# Patient Record
Sex: Female | Born: 1935 | Race: White | Hispanic: No | Smoking: Never smoker
Health system: Southern US, Community
[De-identification: ages and names within clinical notes are randomized; demographics above are authoritative.]

## PROBLEM LIST (undated history)

## (undated) DIAGNOSIS — R519 Headache, unspecified: Secondary | ICD-10-CM

## (undated) DIAGNOSIS — J42 Unspecified chronic bronchitis: Secondary | ICD-10-CM

## (undated) DIAGNOSIS — J449 Chronic obstructive pulmonary disease, unspecified: Secondary | ICD-10-CM

## (undated) DIAGNOSIS — I480 Paroxysmal atrial fibrillation: Secondary | ICD-10-CM

## (undated) DIAGNOSIS — I350 Nonrheumatic aortic (valve) stenosis: Secondary | ICD-10-CM

## (undated) DIAGNOSIS — F329 Major depressive disorder, single episode, unspecified: Secondary | ICD-10-CM

## (undated) DIAGNOSIS — F32A Depression, unspecified: Secondary | ICD-10-CM

## (undated) DIAGNOSIS — E7800 Pure hypercholesterolemia, unspecified: Secondary | ICD-10-CM

## (undated) DIAGNOSIS — R51 Headache: Secondary | ICD-10-CM

## (undated) DIAGNOSIS — I509 Heart failure, unspecified: Secondary | ICD-10-CM

## (undated) DIAGNOSIS — K219 Gastro-esophageal reflux disease without esophagitis: Secondary | ICD-10-CM

## (undated) DIAGNOSIS — E119 Type 2 diabetes mellitus without complications: Secondary | ICD-10-CM

## (undated) DIAGNOSIS — I209 Angina pectoris, unspecified: Secondary | ICD-10-CM

## (undated) DIAGNOSIS — J189 Pneumonia, unspecified organism: Secondary | ICD-10-CM

## (undated) DIAGNOSIS — M1990 Unspecified osteoarthritis, unspecified site: Secondary | ICD-10-CM

## (undated) DIAGNOSIS — F419 Anxiety disorder, unspecified: Secondary | ICD-10-CM

## (undated) DIAGNOSIS — G43909 Migraine, unspecified, not intractable, without status migrainosus: Secondary | ICD-10-CM

## (undated) DIAGNOSIS — I4891 Unspecified atrial fibrillation: Secondary | ICD-10-CM

## (undated) DIAGNOSIS — R4589 Other symptoms and signs involving emotional state: Secondary | ICD-10-CM

## (undated) DIAGNOSIS — I10 Essential (primary) hypertension: Secondary | ICD-10-CM

## (undated) HISTORY — PX: CATARACT EXTRACTION, BILATERAL: SHX1313

## (undated) HISTORY — PX: CARDIAC VALVE REPLACEMENT: SHX585

## (undated) HISTORY — DX: Nonrheumatic aortic (valve) stenosis: I35.0

## (undated) HISTORY — DX: Paroxysmal atrial fibrillation: I48.0

## (undated) HISTORY — PX: EXCISIONAL HEMORRHOIDECTOMY: SHX1541

## (undated) HISTORY — PX: LAPAROSCOPIC CHOLECYSTECTOMY: SUR755

## (undated) HISTORY — DX: Anxiety disorder, unspecified: F41.9

## (undated) HISTORY — DX: Other symptoms and signs involving emotional state: R45.89

---

## 2014-07-29 DIAGNOSIS — I4891 Unspecified atrial fibrillation: Secondary | ICD-10-CM | POA: Insufficient documentation

## 2014-07-30 HISTORY — PX: CARDIAC PACEMAKER PLACEMENT: SHX583

## 2014-08-07 DIAGNOSIS — Z87898 Personal history of other specified conditions: Secondary | ICD-10-CM | POA: Insufficient documentation

## 2016-07-17 DIAGNOSIS — I10 Essential (primary) hypertension: Secondary | ICD-10-CM | POA: Insufficient documentation

## 2016-07-17 DIAGNOSIS — J449 Chronic obstructive pulmonary disease, unspecified: Secondary | ICD-10-CM | POA: Insufficient documentation

## 2016-07-17 DIAGNOSIS — Z952 Presence of prosthetic heart valve: Secondary | ICD-10-CM | POA: Insufficient documentation

## 2016-07-17 DIAGNOSIS — F5101 Primary insomnia: Secondary | ICD-10-CM | POA: Insufficient documentation

## 2016-07-17 DIAGNOSIS — Z95818 Presence of other cardiac implants and grafts: Secondary | ICD-10-CM | POA: Insufficient documentation

## 2016-07-17 DIAGNOSIS — K219 Gastro-esophageal reflux disease without esophagitis: Secondary | ICD-10-CM | POA: Insufficient documentation

## 2016-07-17 DIAGNOSIS — I480 Paroxysmal atrial fibrillation: Secondary | ICD-10-CM | POA: Insufficient documentation

## 2016-07-17 DIAGNOSIS — F411 Generalized anxiety disorder: Secondary | ICD-10-CM | POA: Insufficient documentation

## 2016-07-17 DIAGNOSIS — E119 Type 2 diabetes mellitus without complications: Secondary | ICD-10-CM | POA: Insufficient documentation

## 2016-07-17 DIAGNOSIS — E7800 Pure hypercholesterolemia, unspecified: Secondary | ICD-10-CM | POA: Insufficient documentation

## 2016-11-02 ENCOUNTER — Encounter: Payer: Self-pay | Admitting: Internal Medicine

## 2016-12-18 ENCOUNTER — Other Ambulatory Visit: Payer: Self-pay | Admitting: Family

## 2016-12-18 DIAGNOSIS — R1310 Dysphagia, unspecified: Secondary | ICD-10-CM

## 2016-12-20 ENCOUNTER — Ambulatory Visit
Admission: RE | Admit: 2016-12-20 | Discharge: 2016-12-20 | Disposition: A | Discharge status: Home / Self Care | Payer: Medicare Other | Source: Ambulatory Visit | Attending: Family | Admitting: Family

## 2016-12-20 DIAGNOSIS — R1310 Dysphagia, unspecified: Secondary | ICD-10-CM

## 2016-12-27 ENCOUNTER — Encounter: Payer: Self-pay | Admitting: Internal Medicine

## 2017-01-10 ENCOUNTER — Encounter (HOSPITAL_COMMUNITY): Payer: Self-pay | Admitting: *Deleted

## 2017-01-10 ENCOUNTER — Emergency Department (HOSPITAL_COMMUNITY): Payer: Medicare Other

## 2017-01-10 ENCOUNTER — Emergency Department (HOSPITAL_COMMUNITY)
Admission: EM | Admit: 2017-01-10 | Discharge: 2017-01-11 | Disposition: A | Discharge status: Home / Self Care | Payer: Medicare Other | Attending: Emergency Medicine | Admitting: Emergency Medicine

## 2017-01-10 DIAGNOSIS — Y92002 Bathroom of unspecified non-institutional (private) residence single-family (private) house as the place of occurrence of the external cause: Secondary | ICD-10-CM | POA: Diagnosis not present

## 2017-01-10 DIAGNOSIS — J449 Chronic obstructive pulmonary disease, unspecified: Secondary | ICD-10-CM | POA: Diagnosis not present

## 2017-01-10 DIAGNOSIS — W19XXXA Unspecified fall, initial encounter: Secondary | ICD-10-CM

## 2017-01-10 DIAGNOSIS — Y999 Unspecified external cause status: Secondary | ICD-10-CM | POA: Insufficient documentation

## 2017-01-10 DIAGNOSIS — S2241XA Multiple fractures of ribs, right side, initial encounter for closed fracture: Secondary | ICD-10-CM | POA: Insufficient documentation

## 2017-01-10 DIAGNOSIS — R1110 Vomiting, unspecified: Secondary | ICD-10-CM | POA: Insufficient documentation

## 2017-01-10 DIAGNOSIS — W01198A Fall on same level from slipping, tripping and stumbling with subsequent striking against other object, initial encounter: Secondary | ICD-10-CM | POA: Insufficient documentation

## 2017-01-10 DIAGNOSIS — I110 Hypertensive heart disease with heart failure: Secondary | ICD-10-CM | POA: Insufficient documentation

## 2017-01-10 DIAGNOSIS — I509 Heart failure, unspecified: Secondary | ICD-10-CM | POA: Insufficient documentation

## 2017-01-10 DIAGNOSIS — E119 Type 2 diabetes mellitus without complications: Secondary | ICD-10-CM | POA: Diagnosis not present

## 2017-01-10 DIAGNOSIS — Y93E1 Activity, personal bathing and showering: Secondary | ICD-10-CM | POA: Diagnosis not present

## 2017-01-10 DIAGNOSIS — R0789 Other chest pain: Secondary | ICD-10-CM | POA: Diagnosis present

## 2017-01-10 HISTORY — DX: Unspecified atrial fibrillation: I48.91

## 2017-01-10 HISTORY — DX: Essential (primary) hypertension: I10

## 2017-01-10 HISTORY — DX: Pure hypercholesterolemia, unspecified: E78.00

## 2017-01-10 HISTORY — DX: Chronic obstructive pulmonary disease, unspecified: J44.9

## 2017-01-10 HISTORY — DX: Heart failure, unspecified: I50.9

## 2017-01-10 LAB — CBC
HCT: 40.7 % (ref 36.0–46.0)
Hemoglobin: 13.6 g/dL (ref 12.0–15.0)
MCH: 31.6 pg (ref 26.0–34.0)
MCHC: 33.4 g/dL (ref 30.0–36.0)
MCV: 94.7 fL (ref 78.0–100.0)
Platelets: 390 10*3/uL (ref 150–400)
RBC: 4.3 MIL/uL (ref 3.87–5.11)
RDW: 16.4 % — AB (ref 11.5–15.5)
WBC: 7.8 10*3/uL (ref 4.0–10.5)

## 2017-01-10 LAB — COMPREHENSIVE METABOLIC PANEL
ALBUMIN: 3.8 g/dL (ref 3.5–5.0)
ALK PHOS: 85 U/L (ref 38–126)
ALT: 29 U/L (ref 14–54)
AST: 20 U/L (ref 15–41)
Anion gap: 16 — ABNORMAL HIGH (ref 5–15)
BILIRUBIN TOTAL: 0.9 mg/dL (ref 0.3–1.2)
BUN: 9 mg/dL (ref 6–20)
CALCIUM: 9.3 mg/dL (ref 8.9–10.3)
CO2: 18 mmol/L — AB (ref 22–32)
CREATININE: 0.71 mg/dL (ref 0.44–1.00)
Chloride: 101 mmol/L (ref 101–111)
GFR calc Af Amer: >60 mL/min (ref >60)
GFR calc non Af Amer: >60 mL/min (ref >60)
GLUCOSE: 117 mg/dL — AB (ref 65–99)
Potassium: 3.4 mmol/L — ABNORMAL LOW (ref 3.5–5.1)
SODIUM: 135 mmol/L (ref 135–145)
Total Protein: 7.5 g/dL (ref 6.5–8.1)

## 2017-01-10 LAB — LIPASE, BLOOD: Lipase: 24 U/L (ref 11–51)

## 2017-01-10 MED ORDER — POTASSIUM CHLORIDE CRYS ER 20 MEQ PO TBCR
40.0000 meq | EXTENDED_RELEASE_TABLET | Freq: Once | ORAL | Status: AC
  Administered 2017-01-10: 40 meq via ORAL
  Filled 2017-01-10: qty 2

## 2017-01-10 MED ORDER — ONDANSETRON 4 MG PO TBDP
ORAL_TABLET | ORAL | Status: AC
  Filled 2017-01-10: qty 1

## 2017-01-10 MED ORDER — LIDOCAINE 5 % EX PTCH
1.0000 | MEDICATED_PATCH | CUTANEOUS | Status: DC
  Administered 2017-01-10: 1 via TRANSDERMAL
  Filled 2017-01-10: qty 1

## 2017-01-10 MED ORDER — ONDANSETRON 4 MG PO TBDP
4.0000 mg | ORAL_TABLET | Freq: Once | ORAL | Status: AC | PRN
  Administered 2017-01-10: 4 mg via ORAL

## 2017-01-10 MED ORDER — IOPAMIDOL (ISOVUE-300) INJECTION 61%
INTRAVENOUS | Status: AC
  Administered 2017-01-10: 100 mL
  Filled 2017-01-10: qty 100

## 2017-01-10 MED ORDER — LIDOCAINE 5 % EX PTCH: 1.0000 | MEDICATED_PATCH | CUTANEOUS | 0 refills | Status: DC

## 2017-01-10 MED ORDER — MORPHINE SULFATE (PF) 4 MG/ML IV SOLN
2.0000 mg | Freq: Once | INTRAVENOUS | Status: AC
  Administered 2017-01-10: 2 mg via INTRAVENOUS
  Filled 2017-01-10: qty 1

## 2017-01-10 NOTE — ED Triage Notes (Addendum)
Pt reports falling last week after getting out of the shower. Having right rib pain since the fall. Denies hitting her head. Denies loc. Pt reports n/v this am. Denies diarrhea. Took percocet this am.

## 2017-01-10 NOTE — ED Provider Notes (Signed)
[facility] DEPT Provider Note   CSN: [id number] Arrival date & time: 01/10/17  1218     History   Chief Complaint Chief Complaint  Patient presents with  . Fall  . Emesis    HPI Angela Hines is a 83 y.o. female.  HPI  83 y.o. female with a hx of Atrial Fibrillation, CHF, DM, COPD, HTN, HLD, presents to the Emergency Department today due to fall on Friday last week after getting out of shower. Pt states that she was changing one of the suction mats on the bathtub floor and was flung backwards and struck a bathroom scale with her rib cage. Notes having right rib pain ever since fall. Denies any head trauma or LOC. Noted dry heaving this AM without any emesis this AM, but since resolved. No diarrhea. Hx cholecystectomy. Able to tolerate PO. Rates pain 3/10. Isolated to right axilla. Sharp sensation. Worse with breathing. Took Tylenol PTA. No fevers. No cough. Does not use blood thinners. No other symptoms noted.   Past Medical History:  Diagnosis Date  . Atrial fibrillation ([place])   . CHF (congestive heart failure) ([place])   . COPD (chronic obstructive pulmonary disease) ([place])   . Diabetes mellitus without complication ([place])   . High cholesterol   . Hypertension     There are no active problems to display for this patient.   Past Surgical History:  Procedure Laterality Date  . CARDIAC SURGERY    . CHOLECYSTECTOMY      OB History    No data available       Home Medications    Prior to Admission medications   Not on File    Family History History reviewed. No pertinent family history.  Social History Social History  Substance Use Topics  . Smoking status: Not on file  . Smokeless tobacco: Not on file  . Alcohol use No     Allergies   Ceclor [cefaclor] and Codeine   Review of Systems Review of Systems ROS reviewed and all are negative for acute change except as noted in the HPI.  Physical Exam Updated Vital Signs BP (!) 159/144 (BP Location:  Left Arm)   Pulse 80   Temp 99.3 F (37.4 C) (Oral)   Resp 16   SpO2 97%   Physical Exam  Constitutional: She is oriented to person, place, and time. Vital signs are normal. She appears well-developed and well-nourished. No distress.  HENT:  Head: Normocephalic and atraumatic.  Right Ear: Hearing, tympanic membrane, external ear and ear canal normal.  Left Ear: Hearing, tympanic membrane, external ear and ear canal normal.  Nose: Nose normal.  Mouth/Throat: Uvula is midline, oropharynx is clear and moist and mucous membranes are normal. No trismus in the jaw. No oropharyngeal exudate, posterior oropharyngeal erythema or tonsillar abscesses.  Eyes: Pupils are equal, round, and reactive to light. Conjunctivae and EOM are normal.  Neck: Normal range of motion. Neck supple. No tracheal deviation present.  Cardiovascular: Normal rate, regular rhythm, S1 normal, S2 normal, normal heart sounds, intact distal pulses and normal pulses.   Pulmonary/Chest: Effort normal and breath sounds normal. No respiratory distress. She has no decreased breath sounds. She has no wheezes. She has no rhonchi. She has no rales.  TTP right ribs 7-10. No swelling. No palpable or visible deformities. Ecchymosis noted.   Abdominal: Soft. Normal appearance and bowel sounds are normal. There is no tenderness. There is no rigidity, no rebound, no guarding, no CVA tenderness, no tenderness at  McBurney's point and negative Murphy's sign.  Abdomen soft  Musculoskeletal: Normal range of motion.  Neurological: She is alert and oriented to person, place, and time.  Skin: Skin is warm and dry.  Psychiatric: She has a normal mood and affect. Her speech is normal and behavior is normal. Thought content normal.  Nursing note and vitals reviewed.    ED Treatments / Results  Labs (all labs ordered are listed, but only abnormal results are displayed) Labs Reviewed  COMPREHENSIVE METABOLIC PANEL - Abnormal; Notable for the  following:       Result Value   Potassium 3.4 (*)    CO2 18 (*)    Glucose, Bld 117 (*)    Anion gap 16 (*)    All other components within normal limits  CBC - Abnormal; Notable for the following:    RDW 16.4 (*)    All other components within normal limits  LIPASE, BLOOD    EKG  EKG Interpretation None       Radiology Dg Ribs Unilateral W/chest Right  Result Date: 01/10/2017 CLINICAL DATA:  Right chest pain since a fall getting out of the shower 1 week ago. Initial encounter. EXAM: RIGHT RIBS AND CHEST - 3+ VIEW COMPARISON:  None. FINDINGS: The lungs are clear. No pneumothorax or pleural effusion. Heart size is normal. The patient has acute fractures of the right seventh through ninth ribs. No other fracture is identified. IMPRESSION: Right seventh through ninth rib fractures. Negative for pneumothorax. Electronically Signed   By: Inge Rise M.D.   On: 01/10/2017 14:06   Ct Abdomen Pelvis W Contrast  Result Date: 01/10/2017 CLINICAL DATA:  Multiple right rib fractures EXAM: CT ABDOMEN AND PELVIS WITH CONTRAST TECHNIQUE: Multidetector CT imaging of the abdomen and pelvis was performed using the standard protocol following bolus administration of intravenous contrast. CONTRAST:  14mL ISOVUE-300 IOPAMIDOL (ISOVUE-300) INJECTION 61% COMPARISON:  Radiograph 01/10/2017 FINDINGS: Lower chest: Mild subpleural nodularity in the right middle lobe and lingula possibly due to respiratory infection. Multiple lung nodules, greater than 5 at the bilateral lung bases, the largest on the right measures 4 mm. The largest on the left measures 6 mm. No pleural effusion. Hepatobiliary: Status post cholecystectomy. Mild intra hepatic biliary dilatation. Enlarged extrahepatic bile duct up to 16 mm. 11 mm hypodense lesion inferior right hepatic lobe. Pancreas: Atrophic pancreas.  No inflammatory changes Spleen: Normal in size without focal abnormality. Adrenals/Urinary Tract: Adrenal glands are  unremarkable. Kidneys are normal, without renal calculi, focal lesion, or hydronephrosis. Bladder is unremarkable. Stomach/Bowel: Stomach is within normal limits. Appendix appears normal. No evidence of bowel wall thickening, distention, or inflammatory changes. Vascular/Lymphatic: Aortic atherosclerosis. No enlarged abdominal or pelvic lymph nodes. Reproductive: Calcified uterine fibroids.  No adnexal masses. Other: Negative for free air or free fluid. Musculoskeletal: Partially visualized right sixth and seventh rib fractures anteriorly. Degenerative changes of the spine. IMPRESSION: 1. Negative for liver laceration or subcapsular hematoma 2. Partially visualized right sixth and seventh anterior acute rib fractures 3. Status post cholecystectomy. Small to moderate intra and extrahepatic biliary dilatation, possibly due to post cholecystectomy change. Suggest correlation with laboratory values 4. 11 mm hypodense lesion in the inferior right hepatic lobe, some possible peripheral enhancement on delayed views, could be small hemangioma, however suggest nonemergent, outpatient MRI follow-up when patient is able to adequately breath hold. 5. Mild subpleural nodularity in the right middle lobe and lingula possibly due to respiratory infection. Multiple lung nodules at the bilateral lung bases. Non-contrast chest  CT at 3-6 months is recommended. If the nodules are stable at time of repeat CT, then future CT at 18-24 months (from today's scan) is considered optional for low-risk patients, but is recommended for high-risk patients. This recommendation follows the consensus statement: Guidelines for Management of Incidental Pulmonary Nodules Detected on CT Images: From the Fleischner Society 2017; Radiology 2017; 284:228-243. 6. Calcified uterine fibroids Electronically Signed   By: Donavan Foil M.D.   On: 01/10/2017 23:07    Procedures Procedures (including critical care time)  Medications Ordered in ED Medications   ondansetron (ZOFRAN-ODT) 4 MG disintegrating tablet (not administered)  ondansetron (ZOFRAN-ODT) disintegrating tablet 4 mg (4 mg Oral Given 01/10/17 1247)     Initial Impression / Assessment and Plan / ED Course  I have reviewed the triage vital signs and the nursing notes.  Pertinent labs & imaging results that were available during my care of the patient were reviewed by me and considered in my medical decision making (see chart for details).  Final Clinical Impressions(s) / ED Diagnoses  {I have reviewed and evaluated the relevant laboratory values. {I have reviewed and evaluated the relevant imaging studies. {I have interpreted the relevant EKG. {I have reviewed the relevant previous healthcare records.  {I obtained HPI from historian. {Patient discussed with supervising physician.  ED Course:  Assessment: Pt is a 83 y.o. female with a hx of Atrial Fibrillation, CHF, DM, COPD, HTN, HLD, presents to the Emergency Department today due to fall last week after getting out of shower. Pt states that she was changing one of the suction mats on the bathtub floor and was flung backwards and struck a bathroom scale with her rib cage. Notes having right rib pain ever since fall. Denies any head trauma or LOC. Noted dry heaving this AM without any emesis this AM, but since resolved. No diarrhea. Rates pain 3/10. Isolated to right axilla. Sharp sensation. Worse with breathing. Took Tylenol PTA. No fevers. No cough. Does not use blood thinners. On exam, pt in NAD. Nontoxic/nonseptic appearing. VSS. Afebrile. Lungs CTA. Heart RRR. Abdomen nontender soft. CXR shows right 7th through 10th rib fractures. Non displaced. Lab work unremarkable. Given Lidoderm patch and incentive spirometer in ED. Seen by supervising physician. CT abdomen/pevlis obtained with concern for potential liver laceration vs hematoma, which was negative. Plan is to DC home with follow up to PCP. At time of discharge, Patient is in no acute  distress. Vital Signs are stable. Patient is able to ambulate. Patient able to tolerate PO.   Disposition/Plan:  DC Home Additional Verbal discharge instructions given and discussed with patient.  Pt Instructed to f/u with PCP in the next week for evaluation and treatment of symptoms. Return precautions given Pt acknowledges and agrees with plan  Supervising Physician Orlie Dakin, MD  Final diagnoses:  Fall, initial encounter  Closed fracture of multiple ribs of right side, initial encounter    New Prescriptions New Prescriptions   No medications on file     Shary Decamp, Hershal Coria 01/10/17 [place], MD 01/11/17 [phone]

## 2017-01-10 NOTE — ED Provider Notes (Signed)
Patient tripped on a bath mat 5 days ago injuring her right ribs. Pain is worse with changing positions or with deep inspiration. She denies shortness of breath. She had dry heaves this morning however denies abdominal pain. She treated supple with Percocet last night. On exam she is alert appears mildly uncomfortable chest is tender anteriorly no crepitance lungs clear auscultation abdomen minimally tender at right upper quadrant without guarding rigidity or rebound. X-rays viewed by me   Orlie Dakin, MD 01/10/17 2040

## 2017-01-10 NOTE — ED Notes (Signed)
Reviewed instructions on how to use incentive spirometer, pt demonstrated back to RN appropriately

## 2017-01-10 NOTE — Discharge Instructions (Signed)
Please read and follow all provided instructions.  Your diagnoses today include:  1. Fall, initial encounter   2. Closed fracture of multiple ribs of right side, initial encounter     Tests performed today include: Vital signs. See below for your results today.   Medications prescribed:  Take as prescribed   Home care instructions:  Follow any educational materials contained in this packet.  Follow-up instructions: Please follow-up with your primary care provider for further evaluation of symptoms and treatment   Return instructions:  Please return to the Emergency Department if you do not get better, if you get worse, or new symptoms OR  - Fever (temperature greater than 101.38F)  - Bleeding that does not stop with holding pressure to the area    -Severe pain (please note that you may be more sore the day after your accident)  - Chest Pain  - Difficulty breathing  - Severe nausea or vomiting  - Inability to tolerate food and liquids  - Passing out  - Skin becoming red around your wounds  - Change in mental status (confusion or lethargy)  - New numbness or weakness    Please return if you have any other emergent concerns.  Additional Information:  Your vital signs today were: BP 121/63    Pulse 75    Temp 99.3 F (37.4 C) (Oral)    Resp 18    SpO2 97%  If your blood pressure (BP) was elevated above 135/85 this visit, please have this repeated by your doctor within one month. ---------------

## 2017-01-10 NOTE — ED Notes (Signed)
Patient transported to CT 

## 2017-02-12 ENCOUNTER — Telehealth: Payer: Self-pay

## 2017-02-12 NOTE — Telephone Encounter (Signed)
SENT NOTES TO SCHEDULING 

## 2017-02-14 ENCOUNTER — Ambulatory Visit (INDEPENDENT_AMBULATORY_CARE_PROVIDER_SITE_OTHER): Payer: Medicare Other | Admitting: Cardiovascular Disease

## 2017-02-14 ENCOUNTER — Encounter: Payer: Self-pay | Admitting: Cardiovascular Disease

## 2017-02-14 VITALS — BP 122/74 | HR 91 | Ht 67.0 in | Wt 104.0 lb

## 2017-02-14 DIAGNOSIS — E7800 Pure hypercholesterolemia, unspecified: Secondary | ICD-10-CM | POA: Diagnosis not present

## 2017-02-14 DIAGNOSIS — I480 Paroxysmal atrial fibrillation: Secondary | ICD-10-CM | POA: Diagnosis not present

## 2017-02-14 DIAGNOSIS — I509 Heart failure, unspecified: Secondary | ICD-10-CM

## 2017-02-14 DIAGNOSIS — I10 Essential (primary) hypertension: Secondary | ICD-10-CM

## 2017-02-14 NOTE — Progress Notes (Signed)
Cardiology Office Note   Date:  02/14/2017   ID:  Angela Hines, DOB Nov 15, 1932, MRN [id number]  PCP:  Kelton Pillar, MD  Cardiologist:   Jenkins Rouge, MD   No chief complaint on file.     History of Present Illness: Angela Hines is a 83 y.o. female who presents for consultation for multiple chronic heart issues. Referred by Dr Laurann Montana. She has paroxysmal  afib, CHF, COPD, DM, HTN and HLD. Reviewed notes from  [facility]. She has had PAF with TEE/DCC April 2016 with loop recorder placed for  Syncope. PAF Rx with sotalol and coumadin. She has had at least two mechanical falls in the last year  She has a bioprosthetic AVR done in 2005  Echo 12/2015 mean gradient 22 mmHg peak 58 mmHg EF 55-60%  Echo done most recently 02/01/17 EF 60-65% mean gradient 29 mmHg peak 47 mmHg Myovue No ischemia or infarct EF 65%   Had a 21 mm Edwards Pericardial 2700 series placed by Dr Evelina Dun in September 2005  Past Medical History:  Diagnosis Date  . Atrial fibrillation ([place])   . CHF (congestive heart failure) ([place])   . COPD (chronic obstructive pulmonary disease) ([place])   . Diabetes mellitus without complication ([place])   . High cholesterol   . Hypertension     Past Surgical History:  Procedure Laterality Date  . CARDIAC PACEMAKER PLACEMENT  07/30/2014  . 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 CHOLECYSTECTOMY    . EXCISIONAL HEMORRHOIDECTOMY       Current Outpatient Prescriptions  Medication Sig Dispense Refill  . atorvastatin (LIPITOR) 20 MG tablet Take 20 mg by mouth every evening.    . Fluticasone-Salmeterol (ADVAIR DISKUS IN) Inhale 1 puff into the lungs daily.    . furosemide (LASIX) 20 MG tablet Take 20 mg by mouth daily.    Marland Kitchen gabapentin (NEURONTIN) 300 MG capsule Take 300 mg by mouth 2 (two) times daily.    Marland Kitchen lidocaine (LIDODERM) 5 % Place 1 patch onto the skin daily. Remove & Discard patch within 12 hours or as directed by MD 30 patch 0  .  LORazepam (ATIVAN) 0.5 MG tablet Take 0.5 mg by mouth every 6 (six) hours as needed for anxiety.    Marland Kitchen losartan (COZAAR) 100 MG tablet Take 100 mg by mouth daily.    . metFORMIN (GLUCOPHAGE) 1000 MG tablet Take 1,000 mg by mouth daily.    Marland Kitchen NIFEdipine (PROCARDIA-XL/ADALAT CC) 60 MG 24 hr tablet Take 60 mg by mouth daily.    Marland Kitchen omeprazole (PRILOSEC) 40 MG capsule Take 40 mg by mouth daily.    . potassium chloride (K-DUR,KLOR-CON) 10 MEQ tablet Take 10 mEq by mouth daily.    . Rivaroxaban (XARELTO) 15 MG TABS tablet Take 15 mg by mouth daily with supper.    . sotalol (BETAPACE) 120 MG tablet Take 120 mg by mouth every 12 (twelve) hours.    Marland Kitchen zolpidem (AMBIEN) 10 MG tablet Take 10 mg by mouth at bedtime as needed for sleep.     No current facility-administered medications for this visit.     Allergies:   Clonidine; Captopril; Ceclor [cefaclor]; and Codeine    Social History:  The patient  reports that she has never smoked. She has never used smokeless tobacco. She reports that she does not drink alcohol or use drugs.   Family History:  The patient's family history includes Diabetes in her son; Emphysema  in her father; Heart attack in her son; Heart disease in her son; Hypertension in her brother, brother, and sister; Lung disease in her sister; Multiple myeloma in her son; Other in her son; Stroke in her brother.    ROS:  Please see the history of present illness.   Otherwise, review of systems are positive for none.   All other systems are reviewed and negative.    PHYSICAL EXAM: VS:  BP 122/74   Pulse 91   Ht 5' 7"  (1.702 m)   Wt 104 lb (47.2 kg)   SpO2 99%   BMI 16.29 kg/m  , BMI Body mass index is 16.29 kg/m. Affect appropriate Healthy:  appears stated age 83: normal Neck supple with no adenopathy JVP normal no bruits no thyromegaly Lungs clear with no wheezing and good diaphragmatic motion Heart:  S1/S2 no murmur, no rub, gallop or click PMI normal Abdomen: benighn, BS  positve, no tenderness, no AAA no bruit.  No HSM or HJR Distal pulses intact with no bruits No edema Neuro non-focal Skin warm and dry No muscular weakness    EKG:  SR rate 91 marked inferio-lateral T wave inversions  QT 452/QTc555   Recent Labs: 01/10/2017: ALT 29; BUN 9; Creatinine, Ser 0.71; Hemoglobin 13.6; Platelets 390; Potassium 3.4; Sodium 135    Lipid Panel No results found for: CHOL, TRIG, HDL, CHOLHDL, VLDL, LDLCALC, LDLDIRECT    Wt Readings from Last 3 Encounters:  02/14/17 104 lb (47.2 kg)      Other studies Reviewed: Additional studies/ records that were reviewed today include: Notes from [facility] Echo Myovue ECG Operative note [facility] 2005 [place] AVR .    ASSESSMENT AND PLAN:  1.  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 PAF:  Not clear sotalol best drug for her with prolonged QT will arrange EP f/u to monitor Linq And consider changing ATT based on afib burden 3. Cholesterol :  Labs with primary on statin  4. DM:  Discussed low carb diet.  Target hemoglobin A1c is 6.5 or less.  Continue current medications. 5. HTN:  Well controlled.  Continue current medications and low sodium Dash type diet.      Current medicines are reviewed at length with the patient today.  The patient does not have concerns regarding medicines.  The following changes have been made:  no change  Labs/ tests ordered today include: None   Orders Placed This Encounter  Procedures  . EKG 12-Lead     Disposition:   FU with with me in 6 months and EP next available      Signed, Jenkins Rouge, MD  02/14/2017 4:52 PM    [facility] [address], [place], [place]  [address] Phone: [phone]; Fax: [phone]

## 2017-02-14 NOTE — Patient Instructions (Addendum)
Medication Instructions:  Your physician recommends that you continue on your current medications as directed. Please refer to the Current Medication list given to you today.  Lab work: NONE  Testing/Procedures: NONE  Follow-Up: You have been referred to EP doctor as new patient Patient has LINQ device. Patient diagnosed with Paroxysmal Atrial Fibrillation with medication review.   Your physician wants you to follow-up in: 6 months with Dr. Johnsie Cancel. You will receive a reminder letter in the mail two months in advance. If you don't receive a letter, please call our office to schedule the follow-up appointment.   If you need a refill on your cardiac medications before your next appointment, please call your pharmacy.

## 2017-02-19 ENCOUNTER — Ambulatory Visit (INDEPENDENT_AMBULATORY_CARE_PROVIDER_SITE_OTHER): Payer: Medicare Other | Admitting: Internal Medicine

## 2017-02-19 ENCOUNTER — Encounter: Payer: Self-pay | Admitting: Internal Medicine

## 2017-02-19 VITALS — BP 97/60 | HR 58 | Ht 67.0 in | Wt 111.8 lb

## 2017-02-19 DIAGNOSIS — I480 Paroxysmal atrial fibrillation: Secondary | ICD-10-CM

## 2017-02-19 DIAGNOSIS — I10 Essential (primary) hypertension: Secondary | ICD-10-CM

## 2017-02-19 LAB — CUP PACEART INCLINIC DEVICE CHECK
Date Time Interrogation Session: 20181029120937
MDC IDC PG IMPLANT DT: 20160407

## 2017-02-19 MED ORDER — METOPROLOL TARTRATE 25 MG PO TABS: ORAL_TABLET | ORAL | 1 refills | Status: DC

## 2017-02-19 MED ORDER — SOTALOL HCL 80 MG PO TABS: 80.0000 mg | ORAL_TABLET | Freq: Two times a day (BID) | ORAL | 3 refills | Status: DC

## 2017-02-19 NOTE — Patient Instructions (Addendum)
Medication Instructions:  Your physician has recommended you make the following change in your medication:   1.)  Decrease Sotalol to 80 mg twice a day  2. Start Metoprolol 25 mg every 6 hours as needed if HR is greater than 100 bpm   -- If you need a refill on your cardiac medications before your next appointment, please call your pharmacy. --  Labwork: None ordered  Testing/Procedures: None ordered  Follow-Up: Your physician wants you to follow-up in: 6 weeks with Roderic Palau NP in Afib clinic.    Thank you for choosing [facility]!!   Frederik Schmidt, RN [phone]  Any Other Special Instructions Will Be Listed Below (If Applicable).

## 2017-02-19 NOTE — Progress Notes (Signed)
Electrophysiology Office Note   Date:  02/19/2017   ID:  Jersi Mcmaster, DOB 13-Jun-1932, MRN [id number]  PCP:  Kelton Pillar, MD  Cardiologist:  Dr Johnsie Cancel Primary Electrophysiologist: [name], MD    Chief Complaint  Patient presents with  . Atrial Fibrillation     History of Present Illness: Angela Hines is a 83 y.o. female who presents today for electrophysiology evaluation.   The patient is referred by Dr Johnsie Cancel for further evaluation and management of afib.  The patient is s/p MDT LINQ Implant at [facility].  She has been treated with sotalol for her afib.  She continues to have occasional afib episodes.  When rate controlled, she is asymptomatic.  She is mostly aware of her afib when having RVR.  She is s/p cardioversion 4/16, though most episodes terminate spontaneously.  She is on xarelto for stroke prevention.  Her comorbidities include CHF, COPD, DM, HTN, and aortic valve disease.  She is on xarelto for stroke prevention.  Today, she denies symptoms of palpitations, chest pain, shortness of breath, orthopnea, PND, lower extremity edema, claudication, dizziness, presyncope, syncope, bleeding, or neurologic sequela. The patient is tolerating medications without difficulties and is otherwise without complaint today.    Past Medical History:  Diagnosis Date  . Atrial fibrillation ([place])   . CHF (congestive heart failure) ([place])   . COPD (chronic obstructive pulmonary disease) ([place])   . Diabetes mellitus without complication ([place])   . High cholesterol   . Hypertension    Past Surgical History:  Procedure Laterality Date  . CARDIAC PACEMAKER PLACEMENT  07/30/2014  . 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 CHOLECYSTECTOMY    . EXCISIONAL HEMORRHOIDECTOMY       Current Outpatient Prescriptions  Medication Sig Dispense Refill  . albuterol (PROVENTIL HFA;VENTOLIN HFA) 108 (90 Base) MCG/ACT inhaler Inhale 2 puffs into the lungs every 6 (six) hours as  needed for shortness of breath.    Marland Kitchen atorvastatin (LIPITOR) 20 MG tablet Take 20 mg by mouth every evening.    . diphenhydrAMINE (BENADRYL) 25 MG tablet Take 50 mg by mouth every 6 (six) hours as needed (as directed).    . diphenhydramine-acetaminophen (TYLENOL PM) 25-500 MG TABS tablet Take 4 tablets by mouth at bedtime as needed (sleep/pain).    . Fluticasone-Salmeterol (ADVAIR DISKUS IN) Inhale 1 puff into the lungs daily.    . furosemide (LASIX) 20 MG tablet Take 20 mg by mouth daily.    Marland Kitchen gabapentin (NEURONTIN) 300 MG capsule Take 300 mg by mouth 2 (two) times daily.    Marland Kitchen lidocaine (LIDODERM) 5 % Place 1 patch onto the skin daily. Remove & Discard patch within 12 hours or as directed by MD 30 patch 0  . LORazepam (ATIVAN) 0.5 MG tablet Take 0.5 mg by mouth every 6 (six) hours as needed for anxiety.    Marland Kitchen losartan (COZAAR) 100 MG tablet Take 100 mg by mouth daily.    . metFORMIN (GLUCOPHAGE) 1000 MG tablet Take 1,000 mg by mouth daily.    Marland Kitchen NIFEdipine (PROCARDIA-XL/ADALAT-CC/NIFEDICAL-XL) 30 MG 24 hr tablet Take 30 mg by mouth daily.    Marland Kitchen omeprazole (PRILOSEC) 40 MG capsule Take 40 mg by mouth daily.    . potassium chloride (K-DUR,KLOR-CON) 10 MEQ tablet Take 10 mEq by mouth daily.    . Rivaroxaban (XARELTO) 15 MG TABS tablet Take 15 mg by mouth daily with supper.    . sotalol (BETAPACE) 120 MG tablet  Take 120 mg by mouth every 12 (twelve) hours.    Marland Kitchen zolpidem (AMBIEN) 10 MG tablet Take 10 mg by mouth at bedtime as needed for sleep.     No current facility-administered medications for this visit.     Allergies:   Clonidine; Captopril; Codeine; and Ceclor [cefaclor]   Social History:  The patient  reports that she has never smoked. She has never used smokeless tobacco. She reports that she does not drink alcohol or use drugs.   Family History:  The patient's  family history includes Diabetes in her son; Emphysema in her father; Heart attack in her son; Heart disease in her son;  Hypertension in her brother, brother, and sister; Lung disease in her sister; Multiple myeloma in her son; Other in her son; Stroke in her brother.    ROS:  Please see the history of present illness.   All other systems are personally reviewed and negative.    PHYSICAL EXAM: VS:  BP 97/60   Pulse (!) 58   Ht '5\' 7"'$  (1.702 m)   Wt 111 lb 12.8 oz (50.7 kg)   SpO2 96%   BMI 17.51 kg/m  , BMI Body mass index is 17.51 kg/m. GEN: Well nourished, well developed, in no acute distress  HEENT: normal  Neck: no JVD, carotid bruits, or masses Cardiac: RRR; 2/6 SEM LUSB Respiratory:  clear to auscultation bilaterally, normal work of breathing GI: soft, nontender, nondistended, + BS MS: no deformity or atrophy  Skin: warm and dry  Neuro:  Strength and sensation are intact Psych: euthymic mood, full affect  EKG:  EKG tracings from 02/14/17 and 12/29/15 are reviewed and reveal sinus rhythm but with significant QT prolongation   Recent Labs: 01/10/2017: ALT 29; BUN 9; Creatinine, Ser 0.71; Hemoglobin 13.6; Platelets 390; Potassium 3.4; Sodium 135  personally reviewed   Lipid Panel  No results found for: CHOL, TRIG, HDL, CHOLHDL, VLDL, LDLCALC, LDLDIRECT personally reviewed   Wt Readings from Last 3 Encounters:  02/19/17 111 lb 12.8 oz (50.7 kg)  02/14/17 104 lb (47.2 kg)      Other studies personally reviewed: Additional studies/ records that were reviewed today include: Dr Mariana Arn notes,  echo Review of the above records today demonstrates:  As above   ASSESSMENT AND PLAN:  1.  Paroxysmal atrial fibrillation ILR interrogation is personally reviewed today.  Her AFib burdne is 6.1%.  V rates are elevated during afib. Given QT prolongation, I agree with Dr Johnsie Cancel that we should reduce sotalol to '80mg'$  BID.  Will repeat ecg upon follow-up in AF clinic to see if we can continue this dose or reduce to '80mg'$  daily.  IF her afib burden increases, we will consider amiodarone as an  alternative. I have given metoprolol prn for palpitations and better rate control during afib chads2vasc score is at least 6.  She is on xarelto '15mg'$  daily.  CrCl is 45 and therefore this is the appropriate dose carelink to follow ILR  2. HTN Stable No change required today  3. S/p AVR Per Dr Johnsie Cancel  Follow-up with Dr Johnsie Cancel as scheduled Follow-up in AF clinic in 6 weeks to assess reduced sotalol dose I will follow remotely with carelink and see as needed in the office.  Current medicines are reviewed at length with the patient today.   The patient does not have concerns regarding her medicines.  The following changes were made today:  none    Signed, [name], MD  02/19/2017 10:55 AM  [place] [address] [phone] (office) [phone] (fax)

## 2017-03-07 ENCOUNTER — Ambulatory Visit (INDEPENDENT_AMBULATORY_CARE_PROVIDER_SITE_OTHER): Payer: Medicare Other | Admitting: *Deleted

## 2017-03-07 DIAGNOSIS — I480 Paroxysmal atrial fibrillation: Secondary | ICD-10-CM | POA: Diagnosis not present

## 2017-03-07 LAB — CUP PACEART REMOTE DEVICE CHECK
MDC IDC PG IMPLANT DT: 20160407
MDC IDC SESS DTM: 20181114223708

## 2017-03-08 NOTE — Progress Notes (Signed)
Carelink Summary Report / Loop Recorder 

## 2017-03-22 ENCOUNTER — Telehealth: Payer: Self-pay | Admitting: *Deleted

## 2017-03-22 NOTE — Telephone Encounter (Signed)
Patient son called back and I gave him instructions on how to send a manual transmission. Pt son verbalized understanding.

## 2017-03-22 NOTE — Telephone Encounter (Signed)
LMOVM regarding sending manual transmission to review 7 brady episodes. [facility] phone number to call back.

## 2017-03-23 NOTE — Telephone Encounter (Signed)
Manual transmission received and reviewed. 7 brady episodes, false-- ECGs appear undersensing.

## 2017-04-05 ENCOUNTER — Encounter (HOSPITAL_COMMUNITY): Payer: Self-pay | Admitting: Nurse Practitioner

## 2017-04-05 ENCOUNTER — Ambulatory Visit (HOSPITAL_COMMUNITY)
Admission: RE | Admit: 2017-04-05 | Discharge: 2017-04-05 | Disposition: A | Discharge status: Home / Self Care | Payer: Medicare Other | Source: Ambulatory Visit | Attending: Nurse Practitioner | Admitting: Nurse Practitioner

## 2017-04-05 VITALS — BP 148/76 | HR 81 | Ht 67.0 in | Wt 112.0 lb

## 2017-04-05 DIAGNOSIS — Z7984 Long term (current) use of oral hypoglycemic drugs: Secondary | ICD-10-CM | POA: Insufficient documentation

## 2017-04-05 DIAGNOSIS — I480 Paroxysmal atrial fibrillation: Secondary | ICD-10-CM | POA: Insufficient documentation

## 2017-04-05 DIAGNOSIS — I444 Left anterior fascicular block: Secondary | ICD-10-CM | POA: Diagnosis not present

## 2017-04-05 DIAGNOSIS — I110 Hypertensive heart disease with heart failure: Secondary | ICD-10-CM | POA: Insufficient documentation

## 2017-04-05 DIAGNOSIS — Z9889 Other specified postprocedural states: Secondary | ICD-10-CM | POA: Diagnosis not present

## 2017-04-05 DIAGNOSIS — E7800 Pure hypercholesterolemia, unspecified: Secondary | ICD-10-CM | POA: Diagnosis not present

## 2017-04-05 DIAGNOSIS — Z9049 Acquired absence of other specified parts of digestive tract: Secondary | ICD-10-CM | POA: Insufficient documentation

## 2017-04-05 DIAGNOSIS — I509 Heart failure, unspecified: Secondary | ICD-10-CM | POA: Diagnosis not present

## 2017-04-05 DIAGNOSIS — I4891 Unspecified atrial fibrillation: Secondary | ICD-10-CM | POA: Diagnosis present

## 2017-04-05 DIAGNOSIS — Z950 Presence of cardiac pacemaker: Secondary | ICD-10-CM | POA: Insufficient documentation

## 2017-04-05 DIAGNOSIS — I491 Atrial premature depolarization: Secondary | ICD-10-CM | POA: Diagnosis not present

## 2017-04-05 DIAGNOSIS — J449 Chronic obstructive pulmonary disease, unspecified: Secondary | ICD-10-CM | POA: Diagnosis not present

## 2017-04-05 DIAGNOSIS — Z952 Presence of prosthetic heart valve: Secondary | ICD-10-CM | POA: Insufficient documentation

## 2017-04-05 DIAGNOSIS — Z7901 Long term (current) use of anticoagulants: Secondary | ICD-10-CM | POA: Diagnosis not present

## 2017-04-05 DIAGNOSIS — Z79899 Other long term (current) drug therapy: Secondary | ICD-10-CM | POA: Diagnosis not present

## 2017-04-05 DIAGNOSIS — E119 Type 2 diabetes mellitus without complications: Secondary | ICD-10-CM | POA: Insufficient documentation

## 2017-04-05 NOTE — Progress Notes (Signed)
Primary Care Physician: Kelton Pillar, MD Referring Physician:Dr. Rayann Heman   Angela Hines is a 83 y.o. female with a h/o afib and has  been on sotalol 120 mg bid. She was recently seen by Dr. Rayann Heman and found to have a long qtc and sotalol was decreased to 80 mg bid. She is in the afib clinic to f/u qt with lower dose. Her qtc is now at 476 ms, improved from 555 ms.  She is not having issues with increased afib burden with lower dose. She feels well.  Today, she denies symptoms of palpitations, chest pain, shortness of breath, orthopnea, PND, lower extremity edema, dizziness, presyncope, syncope, or neurologic sequela. The patient is tolerating medications without difficulties and is otherwise without complaint today.   Past Medical History:  Diagnosis Date  . Atrial fibrillation ([place])   . CHF (congestive heart failure) ([place])   . COPD (chronic obstructive pulmonary disease) ([place])   . Diabetes mellitus without complication ([place])   . High cholesterol   . Hypertension    Past Surgical History:  Procedure Laterality Date  . CARDIAC PACEMAKER PLACEMENT  07/30/2014  . 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 CHOLECYSTECTOMY    . EXCISIONAL HEMORRHOIDECTOMY      Current Outpatient Medications  Medication Sig Dispense Refill  . albuterol (PROVENTIL HFA;VENTOLIN HFA) 108 (90 Base) MCG/ACT inhaler Inhale 2 puffs into the lungs every 6 (six) hours as needed for shortness of breath.    Marland Kitchen atorvastatin (LIPITOR) 20 MG tablet Take 20 mg by mouth every evening.    . Fluticasone-Salmeterol (ADVAIR DISKUS IN) Inhale 1 puff into the lungs daily.    . furosemide (LASIX) 20 MG tablet Take 20 mg by mouth daily.    Marland Kitchen gabapentin (NEURONTIN) 300 MG capsule Take 300 mg by mouth 2 (two) times daily.    Marland Kitchen lidocaine (LIDODERM) 5 % Place 1 patch onto the skin daily. Remove & Discard patch within 12 hours or as directed by MD 30 patch 0  . LORazepam (ATIVAN) 0.5 MG tablet Take 0.5 mg by mouth  every 6 (six) hours as needed for anxiety.    Marland Kitchen losartan (COZAAR) 100 MG tablet Take 100 mg by mouth daily.    . metFORMIN (GLUCOPHAGE) 1000 MG tablet Take 1,000 mg by mouth daily.    Marland Kitchen NIFEdipine (PROCARDIA-XL/ADALAT-CC/NIFEDICAL-XL) 30 MG 24 hr tablet Take 30 mg by mouth daily.    Marland Kitchen omeprazole (PRILOSEC) 40 MG capsule Take 40 mg by mouth daily.    . potassium chloride (K-DUR,KLOR-CON) 10 MEQ tablet Take 10 mEq by mouth daily.    . Rivaroxaban (XARELTO) 15 MG TABS tablet Take 15 mg by mouth daily with supper.    . sotalol (BETAPACE) 80 MG tablet Take 1 tablet (80 mg total) by mouth 2 (two) times daily. 180 tablet 3  . zolpidem (AMBIEN) 10 MG tablet Take 10 mg by mouth at bedtime as needed for sleep.    . diphenhydrAMINE (BENADRYL) 25 MG tablet Take 50 mg by mouth every 6 (six) hours as needed (as directed).    . diphenhydramine-acetaminophen (TYLENOL PM) 25-500 MG TABS tablet Take 4 tablets by mouth at bedtime as needed (sleep/pain).     No current facility-administered medications for this encounter.     Allergies  Allergen Reactions  . Clonidine     "head swelling"  . Captopril Swelling  . Codeine      Nausea And Vomiting/GI Intolerance   . Ceclor [  Cefaclor] Rash    Social History   Socioeconomic History  . Marital status: Single    Spouse name: Not on file  . Number of children: Not on file  . Years of education: Not on file  . Highest education level: Not on file  Social Needs  . Financial resource strain: Not on file  . Food insecurity - worry: Not on file  . Food insecurity - inability: Not on file  . Transportation needs - medical: Not on file  . Transportation needs - non-medical: Not on file  Occupational History  . Not on file  Tobacco Use  . Smoking status: Never Smoker  . Smokeless tobacco: Never Used  Substance and Sexual Activity  . Alcohol use: No  . Drug use: No  . Sexual activity: No  Other Topics Concern  . Not on file  Social History Narrative    . Not on file    Family History  Problem Relation Age of Onset  . Emphysema Father   . Hypertension Sister   . Lung disease Sister   . Hypertension Brother   . Hypertension Brother   . Stroke Brother   . Other Son        one spleen removed, one with chronic pain from MVA  . Diabetes Son   . Multiple myeloma Son   . Heart disease Son   . Heart attack Son     ROS- All systems are reviewed and negative except as per the HPI above  Physical Exam: Vitals:   04/05/17 1329  BP: (!) 148/76  Pulse: 81  Weight: 112 lb (50.8 kg)  Height: '5\' 7"'$  (1.702 m)   Wt Readings from Last 3 Encounters:  04/05/17 112 lb (50.8 kg)  02/19/17 111 lb 12.8 oz (50.7 kg)  02/14/17 104 lb (47.2 kg)    Labs: Lab Results  Component Value Date   NA 135 01/10/2017   K 3.4 (L) 01/10/2017   CL 101 01/10/2017   CO2 18 (L) 01/10/2017   GLUCOSE 117 (H) 01/10/2017   BUN 9 01/10/2017   CREATININE 0.71 01/10/2017   CALCIUM 9.3 01/10/2017   No results found for: INR No results found for: CHOL, HDL, LDLCALC, TRIG   GEN- The patient is well appearing, alert and oriented x 3 today.   Head- normocephalic, atraumatic Eyes-  Sclera clear, conjunctiva pink Ears- hearing intact Oropharynx- clear Neck- supple, no JVP Lymph- no cervical lymphadenopathy Lungs- Clear to ausculation bilaterally, normal work of breathing Heart- Regular rate and rhythm, no murmurs, rubs or gallops, PMI not laterally displaced GI- soft, NT, ND, + BS Extremities- no clubbing, cyanosis, or edema MS- no significant deformity or atrophy Skin- no rash or lesion Psych- euthymic mood, full affect Neuro- strength and sensation are intact  EKG- SR at 81 bpm, with PAC's, pr int 168 ms, qrs int 94 ms, qtc 476 ms, [facility] Epic records reviewed    Assessment and Plan: 1. Paroxysmal afib Recent qtc prolongation on 120 mg sotalol bid and now improved at acceptable range 476 ms with 80 mg sotalol bid Continue sotalol 80 mg bid She has  metoprolol for palpitations if needed  Continue xarelto 15 mg qd for a chadsvasc score of at least 6  2. HTN Stable   3. S/p AVR Per Dr. Johnsie Cancel  F/u with Dr. Johnsie Cancel as scheduled and Dr. Rayann Heman per Linq remote checks  Geroge Baseman. [facility] [address] [place], [place] [address]  [phone]   

## 2017-04-06 ENCOUNTER — Ambulatory Visit (INDEPENDENT_AMBULATORY_CARE_PROVIDER_SITE_OTHER): Payer: Medicare Other | Admitting: *Deleted

## 2017-04-06 DIAGNOSIS — I480 Paroxysmal atrial fibrillation: Secondary | ICD-10-CM

## 2017-04-09 NOTE — Progress Notes (Signed)
Carelink Summary Report / Loop Recorder 

## 2017-04-19 LAB — CUP PACEART REMOTE DEVICE CHECK
Implantable Pulse Generator Implant Date: 20160407
MDC IDC SESS DTM: 20181214223613

## 2017-04-24 DIAGNOSIS — J189 Pneumonia, unspecified organism: Secondary | ICD-10-CM

## 2017-04-24 HISTORY — DX: Pneumonia, unspecified organism: J18.9

## 2017-05-07 ENCOUNTER — Ambulatory Visit (INDEPENDENT_AMBULATORY_CARE_PROVIDER_SITE_OTHER): Payer: Medicare Other | Admitting: *Deleted

## 2017-05-07 DIAGNOSIS — I480 Paroxysmal atrial fibrillation: Secondary | ICD-10-CM

## 2017-05-08 ENCOUNTER — Telehealth: Payer: Self-pay | Admitting: *Deleted

## 2017-05-08 NOTE — Telephone Encounter (Signed)
Spoke with patient regarding increased AF burden on LINQ.  Per trend, AF has been persistent since 1/12.  Discussed with patient, she has been asymptomatic with episode and she denies ShOB, palpitations, or chest discomfort.  Patient reports the AF episode is a "surprise" to her.  She does note leg soreness intermittently x1 year and reports that she has been told previously that this is unrelated to the AF and that her circulation is "good."  Advised patient to continue taking medications as prescribed and to call our office or the AF Clinic if she develops any symptoms.  Gave number for AF Clinic at patient's request.  Advised I will review episode and trend with Dr. Rayann Heman when he is back in the office tomorrow for any additional recommendations.  Patient is appreciative and verbalizes understanding of instructions.

## 2017-05-09 ENCOUNTER — Telehealth (HOSPITAL_COMMUNITY): Payer: Self-pay | Admitting: *Deleted
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
## 2017-05-09 NOTE — Telephone Encounter (Signed)
-----   Message from [name], MD sent at 05/09/2017  8:32 AM EST ----- Since reducing her sotalol, LINQ suggests she is having more afib.  Please look at her carelink with Butch Penny and see if we need to do anything different.

## 2017-05-09 NOTE — Progress Notes (Signed)
Carelink Summary Report / Loop Recorder 

## 2017-05-10 NOTE — Telephone Encounter (Signed)
See phone note from Rushie Goltz, RN, on 05/09/17.

## 2017-05-11 ENCOUNTER — Encounter (HOSPITAL_COMMUNITY): Payer: Self-pay | Admitting: Nurse Practitioner

## 2017-05-11 ENCOUNTER — Ambulatory Visit (HOSPITAL_COMMUNITY)
Admission: RE | Admit: 2017-05-11 | Discharge: 2017-05-11 | Disposition: A | Discharge status: Home / Self Care | Payer: Medicare Other | Source: Ambulatory Visit | Attending: Nurse Practitioner | Admitting: Nurse Practitioner

## 2017-05-11 VITALS — BP 114/72 | HR 120 | Ht 67.0 in | Wt 111.2 lb

## 2017-05-11 DIAGNOSIS — J449 Chronic obstructive pulmonary disease, unspecified: Secondary | ICD-10-CM | POA: Insufficient documentation

## 2017-05-11 DIAGNOSIS — I480 Paroxysmal atrial fibrillation: Secondary | ICD-10-CM | POA: Insufficient documentation

## 2017-05-11 DIAGNOSIS — Z952 Presence of prosthetic heart valve: Secondary | ICD-10-CM | POA: Diagnosis not present

## 2017-05-11 DIAGNOSIS — I110 Hypertensive heart disease with heart failure: Secondary | ICD-10-CM | POA: Insufficient documentation

## 2017-05-11 DIAGNOSIS — Z7984 Long term (current) use of oral hypoglycemic drugs: Secondary | ICD-10-CM | POA: Diagnosis not present

## 2017-05-11 DIAGNOSIS — Z79899 Other long term (current) drug therapy: Secondary | ICD-10-CM | POA: Insufficient documentation

## 2017-05-11 DIAGNOSIS — I509 Heart failure, unspecified: Secondary | ICD-10-CM | POA: Diagnosis not present

## 2017-05-11 DIAGNOSIS — E119 Type 2 diabetes mellitus without complications: Secondary | ICD-10-CM | POA: Insufficient documentation

## 2017-05-11 DIAGNOSIS — Z950 Presence of cardiac pacemaker: Secondary | ICD-10-CM | POA: Insufficient documentation

## 2017-05-11 DIAGNOSIS — E7800 Pure hypercholesterolemia, unspecified: Secondary | ICD-10-CM | POA: Diagnosis not present

## 2017-05-11 DIAGNOSIS — Z7901 Long term (current) use of anticoagulants: Secondary | ICD-10-CM | POA: Insufficient documentation

## 2017-05-11 LAB — BASIC METABOLIC PANEL
Anion gap: 11 (ref 5–15)
BUN: 11 mg/dL (ref 6–20)
CALCIUM: 9.5 mg/dL (ref 8.9–10.3)
CHLORIDE: 104 mmol/L (ref 101–111)
CO2: 24 mmol/L (ref 22–32)
Creatinine, Ser: 0.64 mg/dL (ref 0.44–1.00)
GFR calc Af Amer: >60 mL/min (ref >60)
GFR calc non Af Amer: >60 mL/min (ref >60)
GLUCOSE: 119 mg/dL — AB (ref 65–99)
Potassium: 4.1 mmol/L (ref 3.5–5.1)
Sodium: 139 mmol/L (ref 135–145)

## 2017-05-11 LAB — CBC
HEMATOCRIT: 37.8 % (ref 36.0–46.0)
HEMOGLOBIN: 12 g/dL (ref 12.0–15.0)
MCH: 31.5 pg (ref 26.0–34.0)
MCHC: 31.7 g/dL (ref 30.0–36.0)
MCV: 99.2 fL (ref 78.0–100.0)
Platelets: 322 10*3/uL (ref 150–400)
RBC: 3.81 MIL/uL — ABNORMAL LOW (ref 3.87–5.11)
RDW: 15.3 % (ref 11.5–15.5)
WBC: 9.3 10*3/uL (ref 4.0–10.5)

## 2017-05-11 LAB — MAGNESIUM: Magnesium: 1.6 mg/dL — ABNORMAL LOW (ref 1.7–2.4)

## 2017-05-11 NOTE — Patient Instructions (Addendum)
Cardioversion scheduled for Wednesday, January 23rd  - Arrive at the [facility] and go to admitting at 11:30AM  -Do not eat or drink anything after midnight the night prior to your procedure.  - Take all your medication with a sip of water prior to arrival.  - You will not be able to drive home after your procedure.   Your physician has recommended you make the following change in your medication:  1)Stop taking benadryl and tylenol PM   2)Start metoprolol 1/2 tablet in the morning until morning of cardioversion and then stop.
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
## 2017-05-15 ENCOUNTER — Inpatient Hospital Stay (HOSPITAL_COMMUNITY)
Admission: EM | Admit: 2017-05-15 | Discharge: 2017-05-24 | DRG: 871 | Disposition: A | Discharge status: Skilled Nursing Facility | Payer: Medicare Other | Attending: Family Medicine | Admitting: Family Medicine

## 2017-05-15 ENCOUNTER — Emergency Department (HOSPITAL_COMMUNITY): Payer: Medicare Other

## 2017-05-15 ENCOUNTER — Other Ambulatory Visit: Payer: Self-pay | Admitting: Internal Medicine

## 2017-05-15 ENCOUNTER — Other Ambulatory Visit: Payer: Self-pay

## 2017-05-15 ENCOUNTER — Encounter (HOSPITAL_COMMUNITY): Payer: Self-pay | Admitting: Emergency Medicine

## 2017-05-15 DIAGNOSIS — J9621 Acute and chronic respiratory failure with hypoxia: Secondary | ICD-10-CM

## 2017-05-15 DIAGNOSIS — R0602 Shortness of breath: Secondary | ICD-10-CM | POA: Diagnosis present

## 2017-05-15 DIAGNOSIS — E785 Hyperlipidemia, unspecified: Secondary | ICD-10-CM | POA: Diagnosis present

## 2017-05-15 DIAGNOSIS — T450X5A Adverse effect of antiallergic and antiemetic drugs, initial encounter: Secondary | ICD-10-CM | POA: Diagnosis present

## 2017-05-15 DIAGNOSIS — I509 Heart failure, unspecified: Secondary | ICD-10-CM

## 2017-05-15 DIAGNOSIS — T426X5A Adverse effect of other antiepileptic and sedative-hypnotic drugs, initial encounter: Secondary | ICD-10-CM | POA: Diagnosis present

## 2017-05-15 DIAGNOSIS — I481 Persistent atrial fibrillation: Secondary | ICD-10-CM | POA: Diagnosis present

## 2017-05-15 DIAGNOSIS — J9601 Acute respiratory failure with hypoxia: Secondary | ICD-10-CM | POA: Diagnosis not present

## 2017-05-15 DIAGNOSIS — D638 Anemia in other chronic diseases classified elsewhere: Secondary | ICD-10-CM | POA: Diagnosis present

## 2017-05-15 DIAGNOSIS — K219 Gastro-esophageal reflux disease without esophagitis: Secondary | ICD-10-CM | POA: Diagnosis present

## 2017-05-15 DIAGNOSIS — Z950 Presence of cardiac pacemaker: Secondary | ICD-10-CM

## 2017-05-15 DIAGNOSIS — I110 Hypertensive heart disease with heart failure: Secondary | ICD-10-CM | POA: Diagnosis present

## 2017-05-15 DIAGNOSIS — R1310 Dysphagia, unspecified: Secondary | ICD-10-CM | POA: Diagnosis present

## 2017-05-15 DIAGNOSIS — J440 Chronic obstructive pulmonary disease with acute lower respiratory infection: Secondary | ICD-10-CM | POA: Diagnosis present

## 2017-05-15 DIAGNOSIS — D72829 Elevated white blood cell count, unspecified: Secondary | ICD-10-CM | POA: Diagnosis present

## 2017-05-15 DIAGNOSIS — Z833 Family history of diabetes mellitus: Secondary | ICD-10-CM

## 2017-05-15 DIAGNOSIS — T424X5A Adverse effect of benzodiazepines, initial encounter: Secondary | ICD-10-CM | POA: Diagnosis present

## 2017-05-15 DIAGNOSIS — E1140 Type 2 diabetes mellitus with diabetic neuropathy, unspecified: Secondary | ICD-10-CM | POA: Diagnosis present

## 2017-05-15 DIAGNOSIS — Z953 Presence of xenogenic heart valve: Secondary | ICD-10-CM | POA: Diagnosis not present

## 2017-05-15 DIAGNOSIS — Z79899 Other long term (current) drug therapy: Secondary | ICD-10-CM

## 2017-05-15 DIAGNOSIS — I5033 Acute on chronic diastolic (congestive) heart failure: Secondary | ICD-10-CM | POA: Diagnosis not present

## 2017-05-15 DIAGNOSIS — Z885 Allergy status to narcotic agent status: Secondary | ICD-10-CM

## 2017-05-15 DIAGNOSIS — Z66 Do not resuscitate: Secondary | ICD-10-CM | POA: Diagnosis not present

## 2017-05-15 DIAGNOSIS — Z7901 Long term (current) use of anticoagulants: Secondary | ICD-10-CM

## 2017-05-15 DIAGNOSIS — I480 Paroxysmal atrial fibrillation: Secondary | ICD-10-CM

## 2017-05-15 DIAGNOSIS — Z888 Allergy status to other drugs, medicaments and biological substances status: Secondary | ICD-10-CM

## 2017-05-15 DIAGNOSIS — Z7984 Long term (current) use of oral hypoglycemic drugs: Secondary | ICD-10-CM

## 2017-05-15 DIAGNOSIS — E872 Acidosis: Secondary | ICD-10-CM | POA: Diagnosis present

## 2017-05-15 DIAGNOSIS — Z881 Allergy status to other antibiotic agents status: Secondary | ICD-10-CM

## 2017-05-15 DIAGNOSIS — J189 Pneumonia, unspecified organism: Secondary | ICD-10-CM | POA: Diagnosis present

## 2017-05-15 DIAGNOSIS — G92 Toxic encephalopathy: Secondary | ICD-10-CM | POA: Diagnosis not present

## 2017-05-15 DIAGNOSIS — T380X5A Adverse effect of glucocorticoids and synthetic analogues, initial encounter: Secondary | ICD-10-CM | POA: Diagnosis present

## 2017-05-15 DIAGNOSIS — R9431 Abnormal electrocardiogram [ECG] [EKG]: Secondary | ICD-10-CM

## 2017-05-15 DIAGNOSIS — Z515 Encounter for palliative care: Secondary | ICD-10-CM | POA: Diagnosis not present

## 2017-05-15 DIAGNOSIS — Z825 Family history of asthma and other chronic lower respiratory diseases: Secondary | ICD-10-CM

## 2017-05-15 DIAGNOSIS — E119 Type 2 diabetes mellitus without complications: Secondary | ICD-10-CM

## 2017-05-15 DIAGNOSIS — J449 Chronic obstructive pulmonary disease, unspecified: Secondary | ICD-10-CM

## 2017-05-15 DIAGNOSIS — J441 Chronic obstructive pulmonary disease with (acute) exacerbation: Secondary | ICD-10-CM

## 2017-05-15 DIAGNOSIS — I4891 Unspecified atrial fibrillation: Secondary | ICD-10-CM | POA: Diagnosis not present

## 2017-05-15 DIAGNOSIS — A419 Sepsis, unspecified organism: Principal | ICD-10-CM | POA: Diagnosis present

## 2017-05-15 DIAGNOSIS — Z8249 Family history of ischemic heart disease and other diseases of the circulatory system: Secondary | ICD-10-CM

## 2017-05-15 DIAGNOSIS — I10 Essential (primary) hypertension: Secondary | ICD-10-CM | POA: Diagnosis present

## 2017-05-15 LAB — I-STAT ARTERIAL BLOOD GAS, ED
ACID-BASE DEFICIT: 6 mmol/L — AB (ref 0.0–2.0)
Bicarbonate: 21.3 mmol/L (ref 20.0–28.0)
O2 SAT: 99 %
PCO2 ART: 49.2 mmHg — AB (ref 32.0–48.0)
Patient temperature: 98
TCO2: 23 mmol/L (ref 22–32)
pH, Arterial: 7.243 — ABNORMAL LOW (ref 7.350–7.450)
pO2, Arterial: 142 mmHg — ABNORMAL HIGH (ref 83.0–108.0)

## 2017-05-15 LAB — CBC WITH DIFFERENTIAL/PLATELET
Basophils Absolute: 0 10*3/uL (ref 0.0–0.1)
Basophils Relative: 0 %
Eosinophils Absolute: 0.2 10*3/uL (ref 0.0–0.7)
Eosinophils Relative: 1 %
HCT: 41.5 % (ref 36.0–46.0)
Hemoglobin: 13 g/dL (ref 12.0–15.0)
LYMPHS ABS: 7.7 10*3/uL — AB (ref 0.7–4.0)
Lymphocytes Relative: 37 %
MCH: 31.5 pg (ref 26.0–34.0)
MCHC: 31.3 g/dL (ref 30.0–36.0)
MCV: 100.5 fL — AB (ref 78.0–100.0)
MONO ABS: 1 10*3/uL (ref 0.1–1.0)
Monocytes Relative: 5 %
Neutro Abs: 12 10*3/uL — ABNORMAL HIGH (ref 1.7–7.7)
Neutrophils Relative %: 57 %
PLATELETS: 346 10*3/uL (ref 150–400)
RBC: 4.13 MIL/uL (ref 3.87–5.11)
RDW: 14.9 % (ref 11.5–15.5)
WBC: 20.9 10*3/uL — AB (ref 4.0–10.5)

## 2017-05-15 LAB — CUP PACEART REMOTE DEVICE CHECK
Implantable Pulse Generator Implant Date: 20160407
MDC IDC SESS DTM: 20190113230603

## 2017-05-15 LAB — COMPREHENSIVE METABOLIC PANEL
ALT: 25 U/L (ref 14–54)
AST: 31 U/L (ref 15–41)
Albumin: 3.9 g/dL (ref 3.5–5.0)
Alkaline Phosphatase: 108 U/L (ref 38–126)
Anion gap: 14 (ref 5–15)
BUN: 15 mg/dL (ref 6–20)
CHLORIDE: 105 mmol/L (ref 101–111)
CO2: 18 mmol/L — ABNORMAL LOW (ref 22–32)
Calcium: 9.3 mg/dL (ref 8.9–10.3)
Creatinine, Ser: 0.73 mg/dL (ref 0.44–1.00)
GFR calc Af Amer: >60 mL/min (ref >60)
Glucose, Bld: 244 mg/dL — ABNORMAL HIGH (ref 65–99)
POTASSIUM: 4.6 mmol/L (ref 3.5–5.1)
Sodium: 137 mmol/L (ref 135–145)
Total Bilirubin: 0.5 mg/dL (ref 0.3–1.2)
Total Protein: 7.9 g/dL (ref 6.5–8.1)

## 2017-05-15 LAB — I-STAT CHEM 8, ED
BUN: 18 mg/dL (ref 6–20)
CALCIUM ION: 1.17 mmol/L (ref 1.15–1.40)
Chloride: 106 mmol/L (ref 101–111)
Creatinine, Ser: 0.5 mg/dL (ref 0.44–1.00)
GLUCOSE: 250 mg/dL — AB (ref 65–99)
HCT: 44 % (ref 36.0–46.0)
HEMOGLOBIN: 15 g/dL (ref 12.0–15.0)
Potassium: 4.7 mmol/L (ref 3.5–5.1)
Sodium: 138 mmol/L (ref 135–145)
TCO2: 22 mmol/L (ref 22–32)

## 2017-05-15 LAB — I-STAT CG4 LACTIC ACID, ED
LACTIC ACID, VENOUS: 2.78 mmol/L — AB (ref 0.5–1.9)
Lactic Acid, Venous: 1.23 mmol/L (ref 0.5–1.9)

## 2017-05-15 LAB — BRAIN NATRIURETIC PEPTIDE: B NATRIURETIC PEPTIDE 5: 831.6 pg/mL — AB (ref 0.0–100.0)

## 2017-05-15 LAB — I-STAT TROPONIN, ED: TROPONIN I, POC: 0.02 ng/mL (ref 0.00–0.08)

## 2017-05-15 MED ORDER — FUROSEMIDE 10 MG/ML IJ SOLN
40.0000 mg | Freq: Once | INTRAMUSCULAR | Status: AC
  Administered 2017-05-15: 40 mg via INTRAVENOUS
  Filled 2017-05-15: qty 4

## 2017-05-15 MED ORDER — SODIUM CHLORIDE 0.9 % IV SOLN: 250.0000 mL | INTRAVENOUS | Status: DC | PRN

## 2017-05-15 MED ORDER — RIVAROXABAN 15 MG PO TABS
15.0000 mg | ORAL_TABLET | Freq: Every day | ORAL | Status: DC
  Administered 2017-05-16 – 2017-05-23 (×8): 15 mg via ORAL
  Filled 2017-05-15 (×8): qty 1

## 2017-05-15 MED ORDER — METHYLPREDNISOLONE SODIUM SUCC 125 MG IJ SOLR
80.0000 mg | Freq: Four times a day (QID) | INTRAMUSCULAR | Status: DC
Start: 2017-05-15 — End: 2017-05-16
  Administered 2017-05-15 – 2017-05-16 (×3): 80 mg via INTRAVENOUS
  Filled 2017-05-15 (×3): qty 2

## 2017-05-15 MED ORDER — LEVOFLOXACIN IN D5W 750 MG/150ML IV SOLN: 750.0000 mg | INTRAVENOUS | Status: DC

## 2017-05-15 MED ORDER — VANCOMYCIN HCL IN DEXTROSE 750-5 MG/150ML-% IV SOLN: 750.0000 mg | INTRAVENOUS | Status: DC

## 2017-05-15 MED ORDER — SODIUM CHLORIDE 0.9% FLUSH: 3.0000 mL | INTRAVENOUS | Status: DC | PRN

## 2017-05-15 MED ORDER — MAGNESIUM SULFATE 2 GM/50ML IV SOLN
2.0000 g | Freq: Once | INTRAVENOUS | Status: AC
  Administered 2017-05-15: 2 g via INTRAVENOUS
  Filled 2017-05-15: qty 50

## 2017-05-15 MED ORDER — SOTALOL HCL 80 MG PO TABS
80.0000 mg | ORAL_TABLET | Freq: Two times a day (BID) | ORAL | Status: DC
  Administered 2017-05-16: 80 mg via ORAL
  Filled 2017-05-15 (×2): qty 1

## 2017-05-15 MED ORDER — ALBUTEROL SULFATE HFA 108 (90 BASE) MCG/ACT IN AERS: 2.0000 | INHALATION_SPRAY | Freq: Four times a day (QID) | RESPIRATORY_TRACT | Status: DC | PRN

## 2017-05-15 MED ORDER — LORAZEPAM 2 MG/ML IJ SOLN
0.5000 mg | Freq: Once | INTRAMUSCULAR | Status: AC
  Administered 2017-05-15: 0.5 mg via INTRAVENOUS
  Filled 2017-05-15: qty 1

## 2017-05-15 MED ORDER — METOPROLOL TARTRATE 12.5 MG HALF TABLET
12.5000 mg | ORAL_TABLET | Freq: Every day | ORAL | Status: DC
  Administered 2017-05-16: 12.5 mg via ORAL
  Filled 2017-05-15: qty 1

## 2017-05-15 MED ORDER — POTASSIUM CHLORIDE CRYS ER 10 MEQ PO TBCR
10.0000 meq | EXTENDED_RELEASE_TABLET | Freq: Every day | ORAL | Status: DC
  Administered 2017-05-16: 10 meq via ORAL
  Filled 2017-05-15: qty 1

## 2017-05-15 MED ORDER — VANCOMYCIN HCL IN DEXTROSE 1-5 GM/200ML-% IV SOLN
1000.0000 mg | Freq: Once | INTRAVENOUS | Status: AC
  Administered 2017-05-15: 1000 mg via INTRAVENOUS
  Filled 2017-05-15: qty 200

## 2017-05-15 MED ORDER — INSULIN ASPART 100 UNIT/ML ~~LOC~~ SOLN
0.0000 [IU] | Freq: Three times a day (TID) | SUBCUTANEOUS | Status: DC
  Administered 2017-05-16 (×2): 3 [IU] via SUBCUTANEOUS
  Administered 2017-05-16: 5 [IU] via SUBCUTANEOUS
  Administered 2017-05-17 – 2017-05-18 (×4): 3 [IU] via SUBCUTANEOUS
  Administered 2017-05-18 (×2): 2 [IU] via SUBCUTANEOUS
  Administered 2017-05-19: 8 [IU] via SUBCUTANEOUS
  Administered 2017-05-19: 2 [IU] via SUBCUTANEOUS
  Administered 2017-05-20: 3 [IU] via SUBCUTANEOUS
  Administered 2017-05-20: 5 [IU] via SUBCUTANEOUS
  Administered 2017-05-21 (×2): 3 [IU] via SUBCUTANEOUS
  Administered 2017-05-21: 2 [IU] via SUBCUTANEOUS
  Administered 2017-05-22: 3 [IU] via SUBCUTANEOUS
  Administered 2017-05-22 – 2017-05-23 (×2): 2 [IU] via SUBCUTANEOUS
  Administered 2017-05-23: 5 [IU] via SUBCUTANEOUS

## 2017-05-15 MED ORDER — LEVOFLOXACIN IN D5W 750 MG/150ML IV SOLN
750.0000 mg | Freq: Once | INTRAVENOUS | Status: AC
  Administered 2017-05-15: 750 mg via INTRAVENOUS
  Filled 2017-05-15: qty 150

## 2017-05-15 MED ORDER — PANTOPRAZOLE SODIUM 40 MG PO TBEC
40.0000 mg | DELAYED_RELEASE_TABLET | Freq: Every day | ORAL | Status: DC
  Administered 2017-05-16 – 2017-05-24 (×9): 40 mg via ORAL
  Filled 2017-05-15 (×9): qty 1

## 2017-05-15 MED ORDER — FUROSEMIDE 10 MG/ML IJ SOLN
40.0000 mg | Freq: Two times a day (BID) | INTRAMUSCULAR | Status: DC
  Administered 2017-05-16 – 2017-05-17 (×3): 40 mg via INTRAVENOUS
  Filled 2017-05-15 (×3): qty 4

## 2017-05-15 MED ORDER — GABAPENTIN 300 MG PO CAPS
600.0000 mg | ORAL_CAPSULE | Freq: Every day | ORAL | Status: DC
  Administered 2017-05-16 – 2017-05-23 (×8): 600 mg via ORAL
  Filled 2017-05-15 (×8): qty 2

## 2017-05-15 MED ORDER — LORAZEPAM 0.5 MG PO TABS: 0.5000 mg | ORAL_TABLET | ORAL | Status: DC

## 2017-05-15 MED ORDER — SODIUM CHLORIDE 0.9% FLUSH
3.0000 mL | Freq: Two times a day (BID) | INTRAVENOUS | Status: DC
  Administered 2017-05-15 – 2017-05-24 (×15): 3 mL via INTRAVENOUS

## 2017-05-15 MED ORDER — NIFEDIPINE ER 60 MG PO TB24
60.0000 mg | ORAL_TABLET | Freq: Every day | ORAL | Status: DC
  Administered 2017-05-16: 60 mg via ORAL
  Filled 2017-05-15: qty 1

## 2017-05-15 MED ORDER — IPRATROPIUM-ALBUTEROL 0.5-2.5 (3) MG/3ML IN SOLN: 3.0000 mL | Freq: Four times a day (QID) | RESPIRATORY_TRACT | Status: DC

## 2017-05-15 MED ORDER — ATORVASTATIN CALCIUM 20 MG PO TABS
20.0000 mg | ORAL_TABLET | Freq: Every evening | ORAL | Status: DC
  Administered 2017-05-16 – 2017-05-23 (×8): 20 mg via ORAL
  Filled 2017-05-15 (×8): qty 1

## 2017-05-15 MED ORDER — LOSARTAN POTASSIUM 50 MG PO TABS
100.0000 mg | ORAL_TABLET | Freq: Every day | ORAL | Status: DC
  Administered 2017-05-16 – 2017-05-21 (×6): 100 mg via ORAL
  Filled 2017-05-15 (×8): qty 2

## 2017-05-15 NOTE — ED Provider Notes (Signed)
[facility] EMERGENCY DEPARTMENT Provider Note   CSN: [id number] Arrival date & time: 05/15/17  2035     History   Chief Complaint Chief Complaint  Patient presents with  . Shortness of Breath    HPI Angela Hines is a 84 y.o. female.  Patient is a resident of [facility] where EMS was called for SOB. Per EMS history, the patient has had progressive SOB and cough for 2-3 days, worse today. History of COPD, CHF, paroxysmal atrial fibrillation. She denies pain. Family at bedside who provide history and report she was to be admitted tomorrow for cardioversion for paroxysmal a-fib (Dr. Johnsie Cancel). No known fever. She has an inhaler at home but, per family, is not willing to use it regularly. Again, per family, the patient was recently taken off medications she was using to sleep at night, namely benadryl, Ambien, Ativan "and anything else she could find". She continues to take 0.5 mg Ativan tid prn. Family confirms DNR, comfort care only status.   The history is provided by the EMS personnel and a relative. No language interpreter was used.  Shortness of Breath     Past Medical History:  Diagnosis Date  . Atrial fibrillation ([place])   . CHF (congestive heart failure) ([place])   . COPD (chronic obstructive pulmonary disease) ([place])   . Diabetes mellitus without complication ([place])   . High cholesterol   . Hypertension     Patient Active Problem List   Diagnosis Date Noted  . COPD (chronic obstructive pulmonary disease) ([place]) 07/17/2016  . Diabetes mellitus ([place]) 07/17/2016  . Gastroesophageal reflux disease without esophagitis 07/17/2016  . Generalized anxiety disorder 07/17/2016  . Hypercholesterolemia 07/17/2016  . Hypertension 07/17/2016  . Status post placement of implantable loop recorder 07/17/2016  . PAF (paroxysmal atrial fibrillation) ([place]) 07/17/2016  . Primary insomnia 07/17/2016  . S/P AVR (aortic valve replacement) 07/17/2016  .  History of syncope 08/07/2014  . Atrial fibrillation with RVR ([place]) 07/29/2014    Past Surgical History:  Procedure Laterality Date  . CARDIAC PACEMAKER PLACEMENT  07/30/2014  . 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 CHOLECYSTECTOMY    . EXCISIONAL HEMORRHOIDECTOMY      OB History    No data available       Home Medications    Prior to Admission medications   Medication Sig Start Date End Date Taking? Authorizing Provider  albuterol (PROVENTIL HFA;VENTOLIN HFA) 108 (90 Base) MCG/ACT inhaler Inhale 2 puffs into the lungs every 6 (six) hours as needed for shortness of breath.    [provider]  atorvastatin (LIPITOR) 20 MG tablet Take 20 mg by mouth every evening. 12/01/16   [provider]  esomeprazole (NEXIUM) 40 MG capsule Take 40 mg by mouth daily at 12 noon.    [provider]  furosemide (LASIX) 20 MG tablet Take 20 mg by mouth daily. 07/17/16   [provider]  gabapentin (NEURONTIN) 300 MG capsule Take 300 mg by mouth 2 (two) times daily. 07/17/16   [provider]  LORazepam (ATIVAN) 0.5 MG tablet Take 0.5 mg by mouth every 6 (six) hours as needed for anxiety. 10/26/16   [provider]  losartan (COZAAR) 100 MG tablet Take 100 mg by mouth daily.    [provider]  metFORMIN (GLUCOPHAGE) 1000 MG tablet Take 1,000 mg by mouth daily. 12/01/16   [provider]  metoprolol tartrate (LOPRESSOR) 25 MG tablet Take  12.5 mg by mouth daily. Take one tablet every 6 hours as needed if the HR is over 100     [provider]  NIFEdipine (PROCARDIA-XL/ADALAT-CC/NIFEDICAL-XL) 30 MG 24 hr tablet Take 30 mg by mouth daily.    [provider]  potassium chloride (K-DUR,KLOR-CON) 10 MEQ tablet Take 10 mEq by mouth daily. 04/14/16   [provider]  Rivaroxaban (XARELTO) 15 MG TABS tablet Take 15 mg by mouth daily with supper. 07/17/16   [provider]  sotalol (BETAPACE) 80  MG tablet Take 1 tablet (80 mg total) by mouth 2 (two) times daily. 02/19/17   [name], MD    Family History Family History  Problem Relation Age of Onset  . Emphysema Father   . Hypertension Sister   . Lung disease Sister   . Hypertension Brother   . Hypertension Brother   . Stroke Brother   . Other Son        one spleen removed, one with chronic pain from MVA  . Diabetes Son   . Multiple myeloma Son   . Heart disease Son   . Heart attack Son     Social History Social History   Tobacco Use  . Smoking status: Never Smoker  . Smokeless tobacco: Never Used  Substance Use Topics  . Alcohol use: No  . Drug use: No     Allergies   Clonidine; Captopril; Codeine; and Ceclor [cefaclor]   Review of Systems Review of Systems  Unable to perform ROS: Acuity of condition (Patient on CPAP, then BiPAP, unable to contribute significantly to history)  Respiratory: Positive for shortness of breath.      Physical Exam Updated Vital Signs BP (!) 180/95   Pulse (!) 106   Temp 98.3 F (36.8 C) (Rectal)   Resp (!) 36   Ht 5' 7"  (1.702 m)   Wt 49.9 kg (110 lb)   SpO2 97%   BMI 17.23 kg/m   Physical Exam  Constitutional: She appears well-developed and well-nourished.  Frail appearing  HENT:  Head: Normocephalic.  Neck: Normal range of motion. Neck supple.  Cardiovascular: Normal rate and regular rhythm.  Pulmonary/Chest: Effort normal. Tachypnea noted. No respiratory distress. She has decreased breath sounds. She has rhonchi. She has rales.  Abdominal: Soft. Bowel sounds are normal. There is no tenderness. There is no rebound and no guarding.  Musculoskeletal: Normal range of motion.       Right lower leg: She exhibits no edema.  Neurological: She is alert.  Skin: Skin is warm and dry. No rash noted.  Psychiatric: She has a normal mood and affect.     ED Treatments / Results  Labs (all labs ordered are listed, but only abnormal results are displayed) Labs  Reviewed  CBC WITH DIFFERENTIAL/PLATELET - Abnormal; Notable for the following components:      Result Value   WBC 20.9 (*)    MCV 100.5 (*)    All other components within normal limits  COMPREHENSIVE METABOLIC PANEL - Abnormal; Notable for the following components:   CO2 18 (*)    Glucose, Bld 244 (*)    All other components within normal limits  I-STAT CHEM 8, ED - Abnormal; Notable for the following components:   Glucose, Bld 250 (*)    All other components within normal limits  I-STAT CG4 LACTIC ACID, ED - Abnormal; Notable for the following components:   Lactic Acid, Venous 2.78 (*)    All other components within normal  limits  I-STAT ARTERIAL BLOOD GAS, ED - Abnormal; Notable for the following components:   pH, Arterial 7.243 (*)    pCO2 arterial 49.2 (*)    pO2, Arterial 142.0 (*)    Acid-base deficit 6.0 (*)    All other components within normal limits  CULTURE, BLOOD (ROUTINE X 2)  CULTURE, BLOOD (ROUTINE X 2)  URINE CULTURE  BRAIN NATRIURETIC PEPTIDE  BLOOD GAS, ARTERIAL  URINALYSIS, ROUTINE W REFLEX MICROSCOPIC  I-STAT TROPONIN, ED    EKG  EKG Interpretation None       Radiology Dg Chest Port 1 View  Result Date: 05/15/2017 CLINICAL DATA:  Shortness of breath EXAM: PORTABLE CHEST 1 VIEW COMPARISON:  01/10/2017 FINDINGS: Sternotomy wires. Hyperinflation. Diffuse increased interstitial opacity with patchy confluent airspace disease at the bilateral lung bases. Valvular replacement. Electronic recording device over the central lower chest borderline cardiomegaly with aortic atherosclerosis. Probable skin fold artifact over the left chest. IMPRESSION: 1. Borderline cardiomegaly with mild central vascular congestion. 2. Diffuse increased interstitial opacity suggesting mild edema 3. More confluent airspace disease at the bilateral lung bases, suspicious for superimposed pneumonia. Electronically Signed   By: Donavan Foil M.D.   On: 05/15/2017 21:08     Procedures Procedures (including critical care time) CRITICAL CARE Performed by: Dewaine Oats   Total critical care time: 30 minutes  Critical care time was exclusive of separately billable procedures and treating other patients.  Critical care was necessary to treat or prevent imminent or life-threatening deterioration.  Critical care was time spent personally by me on the following activities: development of treatment plan with patient and/or surrogate as well as nursing, discussions with consultants, evaluation of patient's response to treatment, examination of patient, obtaining history from patient or surrogate, ordering and performing treatments and interventions, ordering and review of laboratory studies, ordering and review of radiographic studies, pulse oximetry and re-evaluation of patient's condition.  Medications Ordered in ED Medications  levofloxacin (LEVAQUIN) IVPB 750 mg (not administered)  vancomycin (VANCOCIN) IVPB 1000 mg/200 mL premix (1,000 mg Intravenous New Bag/Given 05/15/17 2157)  magnesium sulfate IVPB 2 g 50 mL (not administered)  levofloxacin (LEVAQUIN) IVPB 750 mg (not administered)  vancomycin (VANCOCIN) IVPB 750 mg/150 ml premix (not administered)  LORazepam (ATIVAN) injection 0.5 mg (not administered)  furosemide (LASIX) injection 40 mg (40 mg Intravenous Given 05/15/17 2053)     Initial Impression / Assessment and Plan / ED Course  I have reviewed the triage vital signs and the nursing notes.  Pertinent labs & imaging results that were available during my care of the patient were reviewed by me and considered in my medical decision making (see chart for details).     Patient presents with significant SOB, on CPAP by EMS with saturations of 88%. History provided by EMS - SOB x 2-3 days, worse tonight. Patient denies pain.   She was switched to bipap on arrival and maintains 98% O2 saturation. She appears somewhat agitated, pulling at the mask,  but is noted to be more awake on serial exams.  CXR shows COPD, mild edema and superimposted bilateral LL PNA. She has no fever but a leukocytosis of 20K. ABX started per CAP protocol.   After discussion with family who confirm a DNR status, hospitalist was consulted for admission vs critical care. Family and patient updated on plan. Ativan provided for agitation.   Final Clinical Impressions(s) / ED Diagnoses   Final diagnoses:  None   1. Respiratory distress 2. CAP, bilateral LL  3. COPD   ED Discharge Orders    None       Dennie Bible 05/15/17 2230    Milton Ferguson, MD 05/15/17 2334

## 2017-05-15 NOTE — Progress Notes (Signed)
Pt placed on Bipap upon arrival. tol well.  RT to monitor.

## 2017-05-15 NOTE — ED Triage Notes (Signed)
Per EMS, pt coming from [facility] at [facility]. Pt is coming in today on the bi pap with complaints of SOB for the last week. Pt has wheezing and rales in all lung bases. Pt received solumedral, attovent/albuterol via ems. Pt O2 is at 100% on arrival.

## 2017-05-15 NOTE — Progress Notes (Signed)
Pharmacy Antibiotic Note  Angela Hines is a 84 y.o. female admitted on 05/15/2017 with pneumonia.  Pharmacy has been consulted for vancomycin and Levaquin dosing. Pt is afebrile. Elevated lactate at 2.78. Elevated WBC 20.9. Scr WNL.  Plan: Vancomycin 1000mg  IV x1 Vancomycin 750mg  IV q24h. Trough goal 15-20. Levaquin 750mg  IV q48h F/u renal fxn, trough @ SS, clinical resolution  Height: 5\' 7"  (170.2 cm) Weight: 110 lb (49.9 kg) IBW/kg (Calculated) : 61.6  Temp (24hrs), Avg:97.7 F (36.5 C), Min:97.1 F (36.2 C), Max:98.3 F (36.8 C)  Recent Labs  Lab 05/11/17 1112 05/15/17 2035 05/15/17 2055 05/15/17 2102  WBC 9.3 20.9*  --   --   CREATININE 0.64 0.73 0.50  --   LATICACIDVEN  --   --   --  2.78*    Estimated Creatinine Clearance: 42.7 mL/min (by C-G formula based on SCr of 0.5 mg/dL).    Allergies  Allergen Reactions  . Clonidine     "head swelling"  . Captopril Swelling  . Codeine      Nausea And Vomiting/GI Intolerance   . Ceclor [Cefaclor] Rash    Antimicrobials this admission: Vanc 1/22 >>  Levaquin 1/22 >>   Dose adjustments this admission: None  Microbiology results: Pending  Thank you for allowing pharmacy to be a part of this patient's care.  Tiffeny Minchew 05/15/2017 9:34 PM

## 2017-05-15 NOTE — H&P (Signed)
History and Physical    Angela Hines IWO:[id number] DOB: [birth year] DOA: 05/15/2017  Referring MD/NP/PA: Dr Roderic Palau  PCP: Kelton Pillar, MD   Outpatient Specialists: Dr Jenkins Rouge, Cardiology  Patient coming from: Independent living  Chief Complaint: Shortness of breath  HPI: Angela Hines is a 84 y.o. female with medical history significant of paroxysmal atrial fibrillation with recent syncopal episode being followed by cardiology who is in an independent living facility but brought in today with significant shortness of breath and altered mental status. Patient has COPD as well as diastolic dysfunction CHF. She is unable to give history now and is agitated slightly. Her son is at the bedside and able to give history with his wife. She apparently uses no home O2 and has been mainly inhaler. Symptoms started 2 days ago and progressively got worse. She is a DO NOT RESUSCITATE and therefore was not intubated on arrival but she came with CPAP via EMS. She is currently on BiPAP in the ER. No reported fever or chills. Patient saw Dr. Rayann Heman electrophysiologist and was scheduled to have ablation of her atrophic ablation tomorrow. She is currently agitated. She used to take Ativan Benadryl and Ambien to sleep at night and those were discontinued recently. Family have noted patient's difficulty with swallowing lately but also she is a diabetic on metformin.  ED Course: Patient was transferred from CPAP to BiPAP in the moment. She has received a dose of Ativan to help calm her down. Chest x-ray showed evidence of mild pulmonary edema but bilateral infiltrates. Her white count is 21,000 with lactic acid of 2.7 and glucose of 250. She has a pH of 7.243 with PCO2 of 49 and PO2 of 142 and her ABG  Review of Systems: As per HPI otherwise 10 point review of systems negative.    Past Medical History:  Diagnosis Date  . Atrial fibrillation ([place])   . CHF (congestive heart failure) ([place])   . COPD  (chronic obstructive pulmonary disease) ([place])   . Diabetes mellitus without complication ([place])   . High cholesterol   . Hypertension     Past Surgical History:  Procedure Laterality Date  . CARDIAC PACEMAKER PLACEMENT  07/30/2014  . 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 CHOLECYSTECTOMY    . EXCISIONAL HEMORRHOIDECTOMY       reports that  has never smoked. she has never used smokeless tobacco. She reports that she does not drink alcohol or use drugs.  Allergies  Allergen Reactions  . Clonidine     "head swelling"  . Captopril Swelling  . Codeine      Nausea And Vomiting/GI Intolerance   . Ceclor [Cefaclor] Rash    Family History  Problem Relation Age of Onset  . Emphysema Father   . Hypertension Sister   . Lung disease Sister   . Hypertension Brother   . Hypertension Brother   . Stroke Brother   . Other Son        one spleen removed, one with chronic pain from MVA  . Diabetes Son   . Multiple myeloma Son   . Heart disease Son   . Heart attack Son     Prior to Admission medications   Medication Sig Start Date End Date Taking? Authorizing Provider  albuterol (PROVENTIL HFA;VENTOLIN HFA) 108 (90 Base) MCG/ACT inhaler Inhale 2 puffs into the lungs every 6 (six) hours as needed for shortness of breath.   Yes [provider]  atorvastatin (LIPITOR) 20 MG tablet Take 20 mg by mouth every evening. 12/01/16  Yes [provider]  furosemide (LASIX) 20 MG tablet Take 20 mg by mouth daily. 07/17/16  Yes [provider]  gabapentin (NEURONTIN) 300 MG capsule Take 600 mg by mouth at bedtime.  07/17/16  Yes [provider]  LORazepam (ATIVAN) 0.5 MG tablet Take 0.5 mg by mouth See admin instructions. Take daily as needed for anxiety.  Take 1 tablet every night at bedtime. 10/26/16  Yes [provider]  losartan (COZAAR) 100 MG tablet Take 100 mg by mouth daily.   Yes [provider]  metFORMIN (GLUCOPHAGE) 1000 MG  tablet Take 1,000 mg by mouth daily. 12/01/16  Yes [provider]  metoprolol tartrate (LOPRESSOR) 25 MG tablet Take 12.5 mg by mouth daily. Take one tablet every 6 hours as needed if the HR is over 100    Yes [provider]  NIFEdipine (PROCARDIA-XL/ADALAT-CC/NIFEDICAL-XL) 30 MG 24 hr tablet Take 60 mg by mouth daily.    Yes [provider]  omeprazole (PRILOSEC) 20 MG capsule Take 40 mg by mouth daily.   Yes [provider]  potassium chloride (K-DUR,KLOR-CON) 10 MEQ tablet Take 10 mEq by mouth daily. 04/14/16  Yes [provider]  Rivaroxaban (XARELTO) 15 MG TABS tablet Take 15 mg by mouth daily with supper. 07/17/16  Yes [provider]  sotalol (BETAPACE) 80 MG tablet Take 1 tablet (80 mg total) by mouth 2 (two) times daily. 02/19/17  Yes [name], MD    Physical Exam: Vitals:   05/15/17 2103 05/15/17 2115 05/15/17 2145 05/15/17 2200  BP:  (!) 180/95 (!) 164/109 (!) 176/96  Pulse:  (!) 106 (!) 105 (!) 104  Resp:  (!) 36 (!) 31 (!) 33  Temp: 98.3 F (36.8 C)     TempSrc: Rectal     SpO2:  97% 99% 99%  Weight:      Height:          Constitutional: NAD, calm, comfortable Vitals:   05/15/17 2103 05/15/17 2115 05/15/17 2145 05/15/17 2200  BP:  (!) 180/95 (!) 164/109 (!) 176/96  Pulse:  (!) 106 (!) 105 (!) 104  Resp:  (!) 36 (!) 31 (!) 33  Temp: 98.3 F (36.8 C)     TempSrc: Rectal     SpO2:  97% 99% 99%  Weight:      Height:       Eyes: PERRL, lids and conjunctivae normal ENMT: Mucous membranes are moist. Posterior pharynx clear of any exudate or lesions.Normal dentition.  Neck: normal, supple, no masses, no thyromegaly Respiratory: On Bipap. Patient has bilateral widespread extra wheezing with crackles and use of accessory muscle  Cardiovascular: Regular rate and rhythm, no murmurs / rubs / gallops. No extremity edema. 2+ pedal pulses. No carotid bruits.  Abdomen: no tenderness, no masses palpated. No  hepatosplenomegaly. Bowel sounds positive.  Musculoskeletal: no clubbing / cyanosis. No joint deformity upper and lower extremities. Good ROM, no contractures. Normal muscle tone.  Skin: no rashes, lesions, ulcers. No induration Neurologic: CN 2-12 grossly intact. Sensation intact, DTR normal. Strength 5/5 in all 4.  Psychiatric: Normal judgment and insight. Alert and oriented x 3. Normal mood.    Labs on Admission: I have personally reviewed following labs and imaging studies  CBC: Recent Labs  Lab 05/11/17 1112 05/15/17 2035 05/15/17 2055  WBC 9.3 20.9*  --   NEUTROABS  --  12.0*  --   HGB  12.0 13.0 15.0  HCT 37.8 41.5 44.0  MCV 99.2 100.5*  --   PLT 322 346  --    Basic Metabolic Panel: Recent Labs  Lab 05/11/17 1112 05/15/17 2035 05/15/17 2055  NA 139 137 138  K 4.1 4.6 4.7  CL 104 105 106  CO2 24 18*  --   GLUCOSE 119* 244* 250*  BUN _0 CREATININE 0.64 0.73 0.50  CALCIUM 9.5 9.3  --   MG 1.6*  --   --    GFR: Estimated Creatinine Clearance: 42.7 mL/min (by C-G formula based on SCr of 0.5 mg/dL). Liver Function Tests: Recent Labs  Lab 05/15/17 2035  AST 31  ALT 25  ALKPHOS 108  BILITOT 0.5  PROT 7.9  ALBUMIN 3.9   No results for input(s): LIPASE, AMYLASE in the last 168 hours. No results for input(s): AMMONIA in the last 168 hours. Coagulation Profile: No results for input(s): INR, PROTIME in the last 168 hours. Cardiac Enzymes: No results for input(s): CKTOTAL, CKMB, CKMBINDEX, TROPONINI in the last 168 hours. BNP (last 3 results) No results for input(s): PROBNP in the last 8760 hours. HbA1C: No results for input(s): HGBA1C in the last 72 hours. CBG: No results for input(s): GLUCAP in the last 168 hours. Lipid Profile: No results for input(s): CHOL, HDL, LDLCALC, TRIG, CHOLHDL, LDLDIRECT in the last 72 hours. Thyroid Function Tests: No results for input(s): TSH, T4TOTAL, FREET4, T3FREE, THYROIDAB in the last 72 hours. Anemia Panel: No  results for input(s): VITAMINB12, FOLATE, FERRITIN, TIBC, IRON, RETICCTPCT in the last 72 hours. Urine analysis: No results found for: COLORURINE, APPEARANCEUR, LABSPEC, PHURINE, GLUCOSEU, HGBUR, BILIRUBINUR, KETONESUR, PROTEINUR, UROBILINOGEN, NITRITE, LEUKOCYTESUR Sepsis Labs: _1 (procalcitonin:4,lacticidven:4) )No results found for this or any previous visit (from the past 240 hour(s)).   Radiological Exams on Admission: Dg Chest Port 1 View  Result Date: 05/15/2017 CLINICAL DATA:  Shortness of breath EXAM: PORTABLE CHEST 1 VIEW COMPARISON:  01/10/2017 FINDINGS: Sternotomy wires. Hyperinflation. Diffuse increased interstitial opacity with patchy confluent airspace disease at the bilateral lung bases. Valvular replacement. Electronic recording device over the central lower chest borderline cardiomegaly with aortic atherosclerosis. Probable skin fold artifact over the left chest. IMPRESSION: 1. Borderline cardiomegaly with mild central vascular congestion. 2. Diffuse increased interstitial opacity suggesting mild edema 3. More confluent airspace disease at the bilateral lung bases, suspicious for superimposed pneumonia. Electronically Signed   By: Donavan Foil M.D.   On: 05/15/2017 21:08    EKG: Independently reviewed.   Assessment/Plan Principal Problem:   Acute and chronic respiratory failure with hypoxia (HCC) Active Problems:   COPD (chronic obstructive pulmonary disease) (HCC)   Diabetes mellitus ([place])   Gastroesophageal reflux disease without esophagitis   Hypertension   Sepsis ([place])   Community acquired pneumonia   Respiratory failure, acute-on-chronic ([place])    #1 acute on chronic respiratory failure: Secondary to bilateral pneumonia and COPD exacerbation. This is superimposed on patient's history of CHF. Will treat underlying causes. Ned Card patient off BiPAP.  #2 COPD exacerbation: Patient will be placed on IV Solu-Medrol with nebulizer and antibiotics.  #3  bilateral lower lobe pneumonia: Patient's last hospitalization was in November last year. She is however been to the ER for other reasons. She'll be treated for pneumonia with vancomycin and Levaquin for possible healthcare associated pneumonia. Monitor blood cultures and sputum culture. Based on patient's reported difficulty swallowing she may have had aspiration pneumonia. Will recommend speech therapy evaluation prior to discharge.  #4 diabetes:  I will hold patient's metformin and use sliding scale insulin while in the hospital. Monitor closely her renal function  #5 sepsis: Secondary to pneumonia. Patient has SIRS criteria with evidence of bilateral pneumonia. She'll be admitted to stepdown unit for that reason  #6 GERD: She is on PPIs and will continue with that.  #7 agitation: Probably secondary to medication withdrawals. She was taken a Benadryl Ambien as well as some Ativan. Patient will be maintain on Ativan on and off in the hospital   DVT prophylaxis: Xarelto   Code Status: DNR  Family Communication: Son and daughter-in-law at bedside. Confirmed that patient is a DO NOT RESUSCITATE  Disposition Plan: To be determined   Consults called: None   Admission status: inpatient   Severity of Illness: The appropriate patient status for this patient is INPATIENT. Inpatient status is judged to be reasonable and necessary in order to provide the required intensity of service to ensure the patient's safety. The patient's presenting symptoms, physical exam findings, and initial radiographic and laboratory data in the context of their chronic comorbidities is felt to place them at high risk for further clinical deterioration. Furthermore, it is not anticipated that the patient will be medically stable for discharge from the hospital within 2 midnights of admission. The following factors support the patient status of inpatient.   " The patient's presenting symptoms include shortness of  breath. " The worrisome physical exam findings include acute respiratory failure requiring BiPAP. " The initial radiographic and laboratory data are worrisome because of evidence of hypoxemia. " The chronic co-morbidities include history of CHF with hypertension and multiple comorbidities.   * I certify that at the point of admission it is my clinical judgment that the patient will require inpatient hospital care spanning beyond 2 midnights from the point of admission due to high intensity of service, high risk for further deterioration and high frequency of surveillance required.Barbette Merino MD [facility] Pager [phone]  If 7PM-7AM, please contact night-coverage [email or web address] Password [facility]  05/15/2017, 10:21 PM

## 2017-05-15 NOTE — ED Notes (Signed)
ED Provider at bedside. 

## 2017-05-16 ENCOUNTER — Ambulatory Visit (HOSPITAL_COMMUNITY): Admission: RE | Admit: 2017-05-16 | Payer: Medicare Other | Source: Ambulatory Visit | Admitting: Cardiovascular Disease

## 2017-05-16 ENCOUNTER — Encounter (HOSPITAL_COMMUNITY)
Admission: EM | Disposition: A | Discharge status: Skilled Nursing Facility | Payer: Self-pay | Source: Home / Self Care | Attending: Internal Medicine

## 2017-05-16 ENCOUNTER — Other Ambulatory Visit (HOSPITAL_COMMUNITY): Payer: Medicare Other

## 2017-05-16 DIAGNOSIS — J441 Chronic obstructive pulmonary disease with (acute) exacerbation: Secondary | ICD-10-CM

## 2017-05-16 DIAGNOSIS — J189 Pneumonia, unspecified organism: Secondary | ICD-10-CM

## 2017-05-16 DIAGNOSIS — I480 Paroxysmal atrial fibrillation: Secondary | ICD-10-CM

## 2017-05-16 DIAGNOSIS — I5033 Acute on chronic diastolic (congestive) heart failure: Secondary | ICD-10-CM

## 2017-05-16 LAB — URINALYSIS, ROUTINE W REFLEX MICROSCOPIC
BILIRUBIN URINE: NEGATIVE
Bacteria, UA: NONE SEEN
GLUCOSE, UA: NEGATIVE mg/dL
HGB URINE DIPSTICK: NEGATIVE
Ketones, ur: NEGATIVE mg/dL
NITRITE: NEGATIVE
Protein, ur: NEGATIVE mg/dL
SPECIFIC GRAVITY, URINE: 1.005 (ref 1.005–1.030)
Squamous Epithelial / LPF: NONE SEEN
pH: 6 (ref 5.0–8.0)

## 2017-05-16 LAB — CBC WITH DIFFERENTIAL/PLATELET
Basophils Absolute: 0 10*3/uL (ref 0.0–0.1)
Basophils Relative: 0 %
EOS ABS: 0 10*3/uL (ref 0.0–0.7)
EOS PCT: 0 %
HCT: 34.2 % — ABNORMAL LOW (ref 36.0–46.0)
HEMOGLOBIN: 10.9 g/dL — AB (ref 12.0–15.0)
LYMPHS ABS: 1.4 10*3/uL (ref 0.7–4.0)
Lymphocytes Relative: 11 %
MCH: 31 pg (ref 26.0–34.0)
MCHC: 31.9 g/dL (ref 30.0–36.0)
MCV: 97.2 fL (ref 78.0–100.0)
MONO ABS: 0.3 10*3/uL (ref 0.1–1.0)
Monocytes Relative: 2 %
Neutro Abs: 11.1 10*3/uL — ABNORMAL HIGH (ref 1.7–7.7)
Neutrophils Relative %: 87 %
PLATELETS: 280 10*3/uL (ref 150–400)
RBC: 3.52 MIL/uL — ABNORMAL LOW (ref 3.87–5.11)
RDW: 14.4 % (ref 11.5–15.5)
WBC: 12.8 10*3/uL — ABNORMAL HIGH (ref 4.0–10.5)

## 2017-05-16 LAB — COMPREHENSIVE METABOLIC PANEL
ALK PHOS: 87 U/L (ref 38–126)
ALT: 22 U/L (ref 14–54)
AST: 23 U/L (ref 15–41)
Albumin: 3.2 g/dL — ABNORMAL LOW (ref 3.5–5.0)
Anion gap: 14 (ref 5–15)
BUN: 15 mg/dL (ref 6–20)
CALCIUM: 9 mg/dL (ref 8.9–10.3)
CHLORIDE: 102 mmol/L (ref 101–111)
CO2: 20 mmol/L — AB (ref 22–32)
CREATININE: 0.59 mg/dL (ref 0.44–1.00)
GFR calc Af Amer: >60 mL/min (ref >60)
GFR calc non Af Amer: >60 mL/min (ref >60)
GLUCOSE: 148 mg/dL — AB (ref 65–99)
Potassium: 3.7 mmol/L (ref 3.5–5.1)
SODIUM: 136 mmol/L (ref 135–145)
Total Bilirubin: 0.8 mg/dL (ref 0.3–1.2)
Total Protein: 6.7 g/dL (ref 6.5–8.1)

## 2017-05-16 LAB — GLUCOSE, CAPILLARY
Glucose-Capillary: 154 mg/dL — ABNORMAL HIGH (ref 65–99)
Glucose-Capillary: 195 mg/dL — ABNORMAL HIGH (ref 65–99)
Glucose-Capillary: 220 mg/dL — ABNORMAL HIGH (ref 65–99)
Glucose-Capillary: 142 mg/dL — ABNORMAL HIGH (ref 65–99)

## 2017-05-16 LAB — STREP PNEUMONIAE URINARY ANTIGEN: STREP PNEUMO URINARY ANTIGEN: NEGATIVE

## 2017-05-16 LAB — HIV ANTIBODY (ROUTINE TESTING W REFLEX): HIV Screen 4th Generation wRfx: NONREACTIVE

## 2017-05-16 LAB — MAGNESIUM: Magnesium: 2.6 mg/dL — ABNORMAL HIGH (ref 1.7–2.4)

## 2017-05-16 LAB — MRSA PCR SCREENING: MRSA by PCR: NEGATIVE

## 2017-05-16 SURGERY — ECHOCARDIOGRAM, TRANSESOPHAGEAL
Anesthesia: Monitor Anesthesia Care

## 2017-05-16 MED ORDER — MAGNESIUM SULFATE 2 GM/50ML IV SOLN
2.0000 g | Freq: Once | INTRAVENOUS | Status: AC
  Administered 2017-05-16: 2 g via INTRAVENOUS
  Filled 2017-05-16: qty 50

## 2017-05-16 MED ORDER — DILTIAZEM HCL-DEXTROSE 100-5 MG/100ML-% IV SOLN (PREMIX)
5.0000 mg/h | INTRAVENOUS | Status: DC
  Administered 2017-05-16: 5 mg/h via INTRAVENOUS
  Filled 2017-05-16: qty 100

## 2017-05-16 MED ORDER — LORAZEPAM 1 MG PO TABS
1.0000 mg | ORAL_TABLET | Freq: Every day | ORAL | Status: DC
  Administered 2017-05-16 – 2017-05-23 (×9): 1 mg via ORAL
  Filled 2017-05-16 (×9): qty 1

## 2017-05-16 MED ORDER — IPRATROPIUM-ALBUTEROL 0.5-2.5 (3) MG/3ML IN SOLN
3.0000 mL | Freq: Three times a day (TID) | RESPIRATORY_TRACT | Status: DC
  Administered 2017-05-16 – 2017-05-20 (×7): 3 mL via RESPIRATORY_TRACT
  Filled 2017-05-16 (×13): qty 3

## 2017-05-16 MED ORDER — PROPRANOLOL HCL 10 MG PO TABS
10.0000 mg | ORAL_TABLET | Freq: Four times a day (QID) | ORAL | Status: DC
  Administered 2017-05-16 – 2017-05-17 (×7): 10 mg via ORAL
  Filled 2017-05-16 (×9): qty 1

## 2017-05-16 MED ORDER — ZOLPIDEM TARTRATE 5 MG PO TABS
5.0000 mg | ORAL_TABLET | Freq: Every evening | ORAL | Status: DC | PRN
  Administered 2017-05-16 – 2017-05-23 (×6): 5 mg via ORAL
  Filled 2017-05-16 (×6): qty 1

## 2017-05-16 MED ORDER — DILTIAZEM LOAD VIA INFUSION
20.0000 mg | Freq: Once | INTRAVENOUS | Status: AC
  Administered 2017-05-16: 20 mg via INTRAVENOUS
  Filled 2017-05-16: qty 20

## 2017-05-16 MED ORDER — METOPROLOL TARTRATE 5 MG/5ML IV SOLN
5.0000 mg | Freq: Once | INTRAVENOUS | Status: AC
  Administered 2017-05-16: 5 mg via INTRAVENOUS
  Filled 2017-05-16: qty 5

## 2017-05-16 MED ORDER — DOXYCYCLINE HYCLATE 100 MG PO TABS
100.0000 mg | ORAL_TABLET | Freq: Two times a day (BID) | ORAL | Status: DC
  Administered 2017-05-16 – 2017-05-20 (×8): 100 mg via ORAL
  Filled 2017-05-16 (×8): qty 1

## 2017-05-16 MED ORDER — ZOLPIDEM TARTRATE 5 MG PO TABS
5.0000 mg | ORAL_TABLET | Freq: Once | ORAL | Status: AC
  Administered 2017-05-16: 5 mg via ORAL
  Filled 2017-05-16: qty 1

## 2017-05-16 MED ORDER — POTASSIUM CHLORIDE CRYS ER 20 MEQ PO TBCR
20.0000 meq | EXTENDED_RELEASE_TABLET | Freq: Once | ORAL | Status: AC
  Administered 2017-05-16: 20 meq via ORAL
  Filled 2017-05-16: qty 1

## 2017-05-16 MED ORDER — LORAZEPAM 0.5 MG PO TABS
0.5000 mg | ORAL_TABLET | Freq: Four times a day (QID) | ORAL | Status: DC | PRN
  Administered 2017-05-16: 0.5 mg via ORAL
  Filled 2017-05-16: qty 1

## 2017-05-16 MED ORDER — LORAZEPAM 1 MG PO TABS
1.0000 mg | ORAL_TABLET | Freq: Every day | ORAL | Status: DC | PRN
  Administered 2017-05-18 – 2017-05-19 (×2): 1 mg via ORAL
  Filled 2017-05-16 (×3): qty 1

## 2017-05-16 MED ORDER — ALBUTEROL SULFATE (2.5 MG/3ML) 0.083% IN NEBU: 2.5000 mg | INHALATION_SOLUTION | Freq: Four times a day (QID) | RESPIRATORY_TRACT | Status: DC | PRN

## 2017-05-16 MED ORDER — METHYLPREDNISOLONE SODIUM SUCC 40 MG IJ SOLR
40.0000 mg | Freq: Two times a day (BID) | INTRAMUSCULAR | Status: DC
  Administered 2017-05-16: 40 mg via INTRAVENOUS
  Filled 2017-05-16: qty 1

## 2017-05-16 NOTE — Progress Notes (Signed)
Contacted by telemetry about patient having v tach, went to pt room to assess pt who had a HR of 71, but expressed having chest tightness and a rapid HR within the past few minutes. Consulted with EP provider, Tommye Standard about pt condition and symptoms.  STAT EKG ordered and performed which showed a prolonged QT interval.  Strip reviewed by Tommye Standard which demonstrated a fib w RVR with short episodes of Torsades with occasional PVCs.  Attending doctor notified, Hongalgi, MD.  IV Magnesium administered. Medications which can prolong QT interval were discontinued and new orders placed.  Continue to monitor patient condition.

## 2017-05-16 NOTE — Evaluation (Signed)
Clinical/Bedside Swallow Evaluation Patient Details  Name: Angela Hines MRN: [id number] Date of Birth: 1933-02-22  Today's Date: 05/16/2017 Time: SLP Start Time (ACUTE ONLY): 1600 SLP Stop Time (ACUTE ONLY): 1617 SLP Time Calculation (min) (ACUTE ONLY): 17 min  Past Medical History:  Past Medical History:  Diagnosis Date  . Atrial fibrillation ([place])   . CHF (congestive heart failure) ([place])   . COPD (chronic obstructive pulmonary disease) ([place])   . Diabetes mellitus without complication ([place])   . High cholesterol   . Hypertension    Past Surgical History:  Past Surgical History:  Procedure Laterality Date  . CARDIAC PACEMAKER PLACEMENT  07/30/2014  . 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 CHOLECYSTECTOMY    . EXCISIONAL HEMORRHOIDECTOMY     HPI:  84 year old female, PMH of chronic diastolic CHF, bioprosthetic AVR, COPD, HTN, DM, HLD, PAF scheduled for ablation 1/23, recent syncopal episode presented with worsening dyspnea and confusion. She required CPAP via EMS and then BiPAP in ED. Admitted for acute respiratory failure with hypoxia due to suspected pneumonia., COPD exacerbation and CHF. Pt had an esophagram in August 2018 that showed moderate presbyesophagus and one instance of transient laryngeal penetration.   Assessment / Plan / Recommendation Clinical Impression  Pt had occasional, delayed throat clearing with solid foods, otherwise without any overt signs concerning for aspiration or an oropharyngeal dysphagia. She had an esophagram completed a few months ago that described primarily esophageal changes (moderate, due to presbyesophagus), and her symptoms appear to be primarily esophageal in nature, including solid foods like meats that get "stuck". Recommend continuing current diet, but reviewed aspiration and esophageal precautions. Will f/u briefly for tolerance given current respiratory status and concern for PNA.  SLP Visit Diagnosis: Dysphagia,  unspecified (R13.10)    Aspiration Risk  Mild aspiration risk    Diet Recommendation Regular;Thin liquid   Liquid Administration via: Cup;Straw Medication Administration: Whole meds with liquid Supervision: Patient able to self feed;Intermittent supervision to cue for compensatory strategies Compensations: Slow rate;Small sips/bites;Follow solids with liquid Postural Changes: Seated upright at 90 degrees;Remain upright for at least 30 minutes after po intake    Other  Recommendations Oral Care Recommendations: Oral care BID   Follow up Recommendations None      Frequency and Duration min 1 x/week  1 week       Prognosis Prognosis for Safe Diet Advancement: Good      Swallow Study   General HPI: 84 year old female, PMH of chronic diastolic CHF, bioprosthetic AVR, COPD, HTN, DM, HLD, PAF scheduled for ablation 1/23, recent syncopal episode presented with worsening dyspnea and confusion. She required CPAP via EMS and then BiPAP in ED. Admitted for acute respiratory failure with hypoxia due to suspected pneumonia., COPD exacerbation and CHF. Pt had an esophagram in August 2018 that showed moderate presbyesophagus and one instance of transient laryngeal penetration. Type of Study: Bedside Swallow Evaluation Previous Swallow Assessment: none in chart Diet Prior to this Study: Regular;Thin liquids Temperature Spikes Noted: No Respiratory Status: Nasal cannula History of Recent Intubation: No Behavior/Cognition: Alert;Cooperative;Pleasant mood Oral Cavity Assessment: Within Functional Limits Oral Care Completed by SLP: No Oral Cavity - Dentition: Adequate natural dentition Vision: Functional for self-feeding Self-Feeding Abilities: Able to feed self Patient Positioning: Upright in bed Baseline Vocal Quality: Normal Volitional Cough: Strong Volitional Swallow: Able to elicit    Oral/Motor/Sensory Function Overall Oral Motor/Sensory Function: Within functional limits   Ice  Chips Ice chips: Not tested  Thin Liquid Thin Liquid: Within functional limits Presentation: Cup;Self Fed;Straw    Nectar Thick Nectar Thick Liquid: Not tested   Honey Thick Honey Thick Liquid: Not tested   Puree Puree: Within functional limits Presentation: Self Fed;Spoon   Solid   GO   Solid: Impaired Presentation: Self Fed Pharyngeal Phase Impairments: Throat Clearing - Delayed        Germain Osgood 05/16/2017,4:33 PM  Germain Osgood, M.A. CCC-SLP [phone]

## 2017-05-16 NOTE — Progress Notes (Signed)
Patient instructed in use of IS and flutter valve.  Good effort and good understanding of use with return demonstration.  Able to use on own.  Encouraged to use IS Q1 w/a, flutter Q2 w/a.

## 2017-05-16 NOTE — ED Notes (Signed)
Bi Pap has been removed. Pt is stable at this time.

## 2017-05-16 NOTE — Progress Notes (Signed)
Pt rapidly went into A Fib with RVR after ambulating to and from the bathroom.  The mostly went back and forth from the 130s and 140s.  BP was 142/95. EKG done, confirmed A Fib.  On call hospitalist, Schorr, NP, was informed and ordered a one time IV metoprolol 5 mg and and an ECHO TEE. Will continue to monitor.  Lupita Dawn, RN

## 2017-05-16 NOTE — Progress Notes (Signed)
Although ordered Ativan 1 mg oral nightly, the pt stated it does not help her get to sleep. She normally takes Ambien 5 mg at home. Chaney Malling, NP, was notified and ordered a one time oral Ambien 5 mg.  Med given. Pt resting in bed with call bell in reach.  Will continue to monitor.  Lupita Dawn, RN

## 2017-05-16 NOTE — Consult Note (Signed)
Cardiology Consultation:   Patient ID: Angela Hines; [id number]; Aug 31, 1932   Admit date: 05/15/2017 Date of Consult: 05/16/2017  Primary Care Provider: Kelton Pillar, MD Primary Cardiologist: Dr. Johnsie Cancel Primary Electrophysiologist:  Dr. Rayann Heman   Patient Profile:   Angela Hines is a 84 y.o. female with a hx of chronic diastolic VHF w/bioprosthetic AVR (2005), CHF, COPD, HTN, DM, HLD, and Paroxysmal AFib, lives at a SNF who is being seen today for the evaluation of AFib at the request of Dr. Algis Liming.   History of Present Illness:   [name] was admitted to [facility] yesterday with increased SOB and AMS/aggitation, placed on CPAP en route to ER >> BIPAP (patient carries DNR), found w/ b/l pneumonia, COPD exacerbation with likely component of her chronic CHF as well.  Also noted her usual medicines Ambien, Ativan, and PM Benadryl were recently stopped and thought may be contributing to her AMS/aggitation as well.  She was started on antibiotics, steroids, IV lasix.  I/O notes fluid neg -1867m, afebrile.  EP service was called with conversion to AFib w/RVR.  The patient today is feeling markedly improved.  She is AAO x3, she reports a fleeting twinge of chest tightness about an hour eariler in the morning, otherwise no CP, palpitations, no dizziness, near syncope or syncope.  She mentions she has been very worried with a son (who is at bedside) with a recent stroke and another who is home with cancer.  Family at bedside son and daughter in law, report today she is 100% better.  LABS K+ 3.7 BUN/Creat 20/0.59 Lactic acid 2.78 >> 1.23 WBC 20.9 >> 12.8 H/H 10/34 plts 280 ABG on BIPAP  Ph 7.2 PCO2 49.2 PO2 142 HCO3 21 Sat 99%   Device information: MDT ILR, implanted 07/30/14 AAD Hx: Sotalol had QT prolongation at 1268mBID dose >> current 8080m Past Medical History:  Diagnosis Date  . Atrial fibrillation ([facility] . CHF (congestive heart failure) ([facility] . COPD (chronic  obstructive pulmonary disease) ([facility] . Diabetes mellitus without complication ([facility] . High cholesterol   . Hypertension     Past Surgical History:  Procedure Laterality Date  . CARDIAC PACEMAKER PLACEMENT  07/30/2014  . 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 CHOLECYSTECTOMY    . EXCISIONAL HEMORRHOIDECTOMY         Inpatient Medications: Scheduled Meds: . atorvastatin  20 mg Oral QPM  . furosemide  40 mg Intravenous BID  . gabapentin  600 mg Oral QHS  . insulin aspart  0-15 Units Subcutaneous TID WC  . ipratropium-albuterol  3 mL Nebulization TID  . LORazepam  1 mg Oral QHS  . losartan  100 mg Oral Daily  . methylPREDNISolone (SOLU-MEDROL) injection  80 mg Intravenous Q6H  . metoprolol tartrate  12.5 mg Oral Daily  . NIFEdipine  60 mg Oral Daily  . pantoprazole  40 mg Oral Daily  . potassium chloride  10 mEq Oral Daily  . Rivaroxaban  15 mg Oral Q supper  . sodium chloride flush  3 mL Intravenous Q12H  . sotalol  80 mg Oral BID   Continuous Infusions: . sodium chloride    . diltiazem (CARDIZEM) infusion 5 mg/hr (05/16/17 0816)  . [START ON 05/17/2017] levofloxacin (LEVAQUIN) IV     PRN Meds: sodium chloride, albuterol, LORazepam, LORazepam **AND** LORazepam, sodium chloride flush  Allergies:    Allergies  Allergen Reactions  . Clonidine     "  head swelling"  . Captopril Swelling  . Codeine      Nausea And Vomiting/GI Intolerance   . Ceclor [Cefaclor] Rash    Social History:   Social History   Socioeconomic History  . Marital status: Single    Spouse name: Not on file  . Number of children: Not on file  . Years of education: Not on file  . Highest education level: Not on file  Social Needs  . Financial resource strain: Not on file  . Food insecurity - worry: Not on file  . Food insecurity - inability: Not on file  . Transportation needs - medical: Not on file  . Transportation needs - non-medical: Not on file  Occupational History    . Not on file  Tobacco Use  . Smoking status: Never Smoker  . Smokeless tobacco: Never Used  Substance and Sexual Activity  . Alcohol use: No  . Drug use: No  . Sexual activity: No  Other Topics Concern  . Not on file  Social History Narrative  . Not on file    Family History:   Family History  Problem Relation Age of Onset  . Emphysema Father   . Hypertension Sister   . Lung disease Sister   . Hypertension Brother   . Hypertension Brother   . Stroke Brother   . Other Son        one spleen removed, one with chronic pain from MVA  . Diabetes Son   . Multiple myeloma Son   . Heart disease Son   . Heart attack Son      ROS:  Please see the history of present illness.  ROS  Negative except for as above   Physical Exam/Data:   Vitals:   05/16/17 0555 05/16/17 0801 05/16/17 0908 05/16/17 1143  BP: (!) 142/95 (!) 141/89  (!) 117/59  Pulse: (!) 138 (!) 130  68  Resp: (!) 26 (!) 26  (!) 24  Temp: 97.8 F (36.6 C) 97.8 F (36.6 C)  98.3 F (36.8 C)  TempSrc: Oral Oral  Oral  SpO2: 98% 99% 98% 98%  Weight:      Height:        Intake/Output Summary (Last 24 hours) at 05/16/2017 1159 Last data filed at 05/16/2017 1120 Gross per 24 hour  Intake 370 ml  Output 1850 ml  Net -1480 ml   Filed Weights   05/15/17 2041 05/16/17 0100  Weight: 110 lb (49.9 kg) 111 lb 12.4 oz (50.7 kg)   Body mass index is 17.51 kg/m.  General:  Very frail, cachetic appearance, in no acute distress HEENT: normal Lymph: no adenopathy Neck: no JVD Vascular: No carotid bruits  Cardiac:  RRR; 2/6 SM, no gallops or rubs Lungs:  CTA b/l this AM, no wheezing, rhonchi or rales  Abd: soft, nontender Ext: no edema Musculoskeletal:  No deformities, advanced atrophy Skin: warm and dry  Neuro:   no focal abnormalities noted, she is AAO x3  today Psych:  Normal affect   EKG:  The EKG was personally reviewed and demonstrates:   05/16/17 #2 is SR 71bpm, ST changes resolved, marked QT  prolongation 63m 05/16/17 #1 is AFib 146bpm, ST/T changes laterally noted 05/15/17: is ST 105bpm, baseline motion/artifact 05/11/17 AFib 120bpm  Telemetry:  Telemetry was personally reviewed and demonstrates:   AFib w/RVR 130's >> SR with short episodes of torsades, occ PVC's  Relevant CV Studies:  Echo is ordered   Dr. NJohnsie Cancelnoted an echo  02/01/17 EF 60-65% mean gradient 29 mmHg peak 47 mmHg Myovue No ischemia or infarct EF 65%    01/10/16 TTE LVEF 55-60% Modd-severe AS, peak gradient 63mhg  Laboratory Data:  Chemistry Recent Labs  Lab 05/11/17 1112 05/15/17 2035 05/15/17 2055 05/16/17 0423  NA 139 137 138 136  K 4.1 4.6 4.7 3.7  CL 104 105 106 102  CO2 24 18*  --  20*  GLUCOSE 119* 244* 250* 148*  BUN 11 15 18 15   CREATININE 0.64 0.73 0.50 0.59  CALCIUM 9.5 9.3  --  9.0  GFRNONAA >60 >60  --  >60  GFRAA >60 >60  --  >60  ANIONGAP 11 14  --  14    Recent Labs  Lab 05/15/17 2035 05/16/17 0423  PROT 7.9 6.7  ALBUMIN 3.9 3.2*  AST 31 23  ALT 25 22  ALKPHOS 108 87  BILITOT 0.5 0.8   Hematology Recent Labs  Lab 05/11/17 1112 05/15/17 2035 05/15/17 2055 05/16/17 0423  WBC 9.3 20.9*  --  12.8*  RBC 3.81* 4.13  --  3.52*  HGB 12.0 13.0 15.0 10.9*  HCT 37.8 41.5 44.0 34.2*  MCV 99.2 100.5*  --  97.2  MCH 31.5 31.5  --  31.0  MCHC 31.7 31.3  --  31.9  RDW 15.3 14.9  --  14.4  PLT 322 346  --  280   Cardiac EnzymesNo results for input(s): TROPONINI in the last 168 hours.  Recent Labs  Lab 05/15/17 2053  TROPIPOC 0.02    BNP Recent Labs  Lab 05/15/17 2035  BNP 831.6*    DDimer No results for input(s): DDIMER in the last 168 hours.  Radiology/Studies:   Dg Chest Port 1 View Result Date: 05/15/2017 CLINICAL DATA:  Shortness of breath EXAM: PORTABLE CHEST 1 VIEW COMPARISON:  01/10/2017 FINDINGS: Sternotomy wires. Hyperinflation. Diffuse increased interstitial opacity with patchy confluent airspace disease at the bilateral lung bases. Valvular  replacement. Electronic recording device over the central lower chest borderline cardiomegaly with aortic atherosclerosis. Probable skin fold artifact over the left chest. IMPRESSION: 1. Borderline cardiomegaly with mild central vascular congestion. 2. Diffuse increased interstitial opacity suggesting mild edema 3. More confluent airspace disease at the bilateral lung bases, suspicious for superimposed pneumonia. Electronically Signed   By: KDonavan FoilM.D.   On: 05/15/2017 21:08    Assessment and Plan:   1. Paroxysmal AFib     CHA2DS2Vasc is 5, on Xarelto 160mdose      (current Calc clearance is 58, though 0.7 historically makes clearance 49)  Stat EKG was ordered, marked QT prolongation, breif episodes of torsades on telemetry D/C'd sotalol and levaquin D/c dilt gtt, and metoprolol 2gm mag ordered, propanolol, K+  Initially ordered for ICU transfer, though patient confirms her wishes of full DNR status D/w Dr. TaLovena LeOKBrowns Millso keep in 4E bed   2. AMS     This seems resolved     Deferred to medicine team  3. SOB, likely multifactorial      On n/c O2 now          Pneumonia, COPD,+/- CHF     Respiratory status is much improrved     For questions or updates, please contact [facility] Please consult [email or web address] for contact info under Cardiology/STEMI.   Signed, ReBaldwin JamaicaPA-C  05/16/2017 11:59 AM   EP Attending  Patient seen and examined. Agree with above. The patient is improved this afternoon after stopping levaquin and  sotalol and repleting her Mg. She has had runs of torsades and her QT is markedly prolonged. She denies anginal symptoms. She has not changed her medications except for the levaquin. I have recommended stopping all QT prolonging drugs. Consider the use of rate control with beta blodkers and stop sotalol permanently. Treat pneumonia with anti-biotics that do not prolong the QT.   Mikle Bosworth.D.

## 2017-05-16 NOTE — Progress Notes (Addendum)
PROGRESS NOTE   Angela Hines  JYN:[id number]    DOB: [birth year]    DOA: 05/15/2017  PCP: Kelton Pillar, MD   I have briefly reviewed patients previous medical records in [facility].  Brief Narrative:  84 year old female, PMH of chronic diastolic CHF, bioprosthetic AVR, COPD, HTN, DM, HLD, PAF scheduled for ablation 1/23, recent syncopal episode presented with worsening dyspnea and confusion.  She required CPAP via EMS and then BiPAP in ED.  Admitted for acute respiratory failure with hypoxia due to suspected pneumonia.,  COPD exacerbation and CHF.  Able to come off BiPAP.  Intermittent A. fib with RVR.  EP cardiology consulted 1/23.   Assessment & Plan:   Principal Problem:   Acute and chronic respiratory failure with hypoxia (HCC) Active Problems:   COPD (chronic obstructive pulmonary disease) (HCC)   Diabetes mellitus (HCC)   Gastroesophageal reflux disease without esophagitis   Hypertension   Sepsis ([place])   Community acquired pneumonia   Respiratory failure, acute-on-chronic ([place])   1. Acute respiratory failure with hypoxia: Possibly multifactorial related to COPD exacerbation, decompensated CHF and possible pneumonia.  Treat underlying cause.  Required CPAP >BiPAP on admission now weaned to Supreme oxygen.  Wean oxygen for oxygen saturation between 88-92%.  Incentive spirometry and flutter valve. 2. COPD exacerbation: Reduce IV Solu-Medrol.  Flutter valve.  Discontinue levofloxacin due to QT prolongation and transient torsades.  Oral doxycycline. 3. Acute on chronic diastolic CHF: -1.5 L since admission.  Continue IV Lasix 40 mg twice daily and consider reducing or changing to p.o. in a.m.  2D echo 01/12/16: "Good LV pumping strength" and no EF documented.  Defer to cardiology regarding repeating 2D echo. 4. Possible HCAP: Blood cultures negative to date, MRSA PCR negative.  Leukocytosis has improved.  Discontinued vancomycin.  Discontinue levofloxacin due to prolonged QT and  transient torsades.  Not fully convinced that this is pneumonia and all her presentation may be related to CHF/COPD. Started doxycycline.  Follow-up chest x-ray to resolution.  Obtains swallow evaluation by speech therapy. 5. Paroxysmal A. Fib: CHA2DS2Vasc is 5, on Xarelto 15mg  dose.  She had a couple of hours of RVR early morning 1/23.  Started on Cardizem drip and converted to sinus rhythm.  EP cardiology consulted, stat EKG showed marked QT prolongation, noted transient torsades.  Cardiology discontinued sotalol, levofloxacin, Cardizem drip and metoprolol.  Propranolol started.  Initial plan was to transfer to ICU by cardiology but since DNR, remained in stepdown bed. 6. Confusion/toxic encephalopathy: Probably related to medications (Benadryl, Ambien and Ativan).  Improved/resolved. 7. Type II DM: Hold metformin.  Continue SSI.  Mildly uncontrolled. 8. Possible sepsis versus SIRS: Present on admission.  Improved. 9. GERD: PPI. 10. Anemia: Hemoglobin dropped from 12 on 1/18-10.9.  No overt bleeding.  Follow CBC in a.m. 11. Status post bioprosthetic AVR: 12. Essential hypertension: Controlled. 13. Prolonged QTC: EKG 05/16/17 at 6:08 AM: 483 ms.  There is probably another one which is not scanned in epic that was reviewed by EP Cardiology.  Discontinue and avoid precipitating medications.  Replacing magnesium.  Continue telemetry.     DVT prophylaxis: Xarelto Code Status: DNR Family Communication: None at bedside Disposition: To be determined   Consultants:  EP cardiology  Procedures:  BiPAP  Antimicrobials:  Discontinued vancomycin and levofloxacin Oral doxycycline 1/23 >   Subjective: Reports several weeks history of dyspnea on exertion, cough with intermittent yellow sputum, legs feeling weak like they are going to give away.  No chest pain, fever  or chills.  ROS: As above  Objective:  Vitals:   05/16/17 0801 05/16/17 0908 05/16/17 1143 05/16/17 1430  BP: (!) 141/89  (!)  117/59 113/74  Pulse: (!) 130  68 72  Resp: (!) 26  (!) 24   Temp: 97.8 F (36.6 C)  98.3 F (36.8 C)   TempSrc: Oral  Oral   SpO2: 99% 98% 98%   Weight:      Height:        Examination:  General exam: Elderly female, small built and frail, lying comfortably propped up in bed without distress. Respiratory system: Basal crackles, right greater than sign left with diminished breath sounds.  Rest of lung fields clear to auscultation. Respiratory effort normal. Cardiovascular system: S1 & S2 heard, RRR. No JVD, murmurs, rubs, gallops or clicks.  Trace bilateral ankle edema.  She was in rapid A. fib which reverted to sinus rhythm at 9:50 AM 1/23. Gastrointestinal system: Abdomen is nondistended, soft and nontender. No organomegaly or masses felt. Normal bowel sounds heard. Central nervous system: Alert and oriented. No focal neurological deficits. Extremities: Symmetric 5 x 5 power. Skin: No rashes, lesions or ulcers Psychiatry: Judgement and insight appear normal. Mood & affect appropriate.     Data Reviewed: I have personally reviewed following labs and imaging studies  CBC: Recent Labs  Lab 05/11/17 1112 05/15/17 2035 05/15/17 2055 05/16/17 0423  WBC 9.3 20.9*  --  12.8*  NEUTROABS  --  12.0*  --  11.1*  HGB 12.0 13.0 15.0 10.9*  HCT 37.8 41.5 44.0 34.2*  MCV 99.2 100.5*  --  97.2  PLT 322 346  --  947   Basic Metabolic Panel: Recent Labs  Lab 05/11/17 1112 05/15/17 2035 05/15/17 2055 05/16/17 0423  NA 139 137 138 136  K 4.1 4.6 4.7 3.7  CL 104 105 106 102  CO2 24 18*  --  20*  GLUCOSE 119* 244* 250* 148*  BUN 11 15 18 15   CREATININE 0.64 0.73 0.50 0.59  CALCIUM 9.5 9.3  --  9.0  MG 1.6*  --   --   --    Liver Function Tests: Recent Labs  Lab 05/15/17 2035 05/16/17 0423  AST 31 23  ALT 25 22  ALKPHOS 108 87  BILITOT 0.5 0.8  PROT 7.9 6.7  ALBUMIN 3.9 3.2*   Coagulation Profile: No results for input(s): INR, PROTIME in the last 168 hours. Cardiac  Enzymes: No results for input(s): CKTOTAL, CKMB, CKMBINDEX, TROPONINI in the last 168 hours. HbA1C: No results for input(s): HGBA1C in the last 72 hours. CBG: Recent Labs  Lab 05/16/17 0701 05/16/17 1141  GLUCAP 154* 195*    Recent Results (from the past 240 hour(s))  Blood Culture (routine x 2)     Status: None (Preliminary result)   Collection Time: 05/15/17  8:35 PM  Result Value Ref Range Status   Specimen Description BLOOD RIGHT ANTECUBITAL  Final   Special Requests   Final    BOTTLES DRAWN AEROBIC AND ANAEROBIC Blood Culture adequate volume   Culture NO GROWTH < 12 HOURS  Final   Report Status PENDING  Incomplete  Blood Culture (routine x 2)     Status: None (Preliminary result)   Collection Time: 05/15/17  9:06 PM  Result Value Ref Range Status   Specimen Description BLOOD LEFT ANTECUBITAL  Final   Special Requests   Final    BOTTLES DRAWN AEROBIC AND ANAEROBIC Blood Culture adequate volume   Culture NO  GROWTH < 12 HOURS  Final   Report Status PENDING  Incomplete  MRSA PCR Screening     Status: None   Collection Time: 05/16/17  1:57 AM  Result Value Ref Range Status   MRSA by PCR NEGATIVE NEGATIVE Final    Comment:        The GeneXpert MRSA Assay (FDA approved for NASAL specimens only), is one component of a comprehensive MRSA colonization surveillance program. It is not intended to diagnose MRSA infection nor to guide or monitor treatment for MRSA infections.          Radiology Studies: Dg Chest Port 1 View  Result Date: 05/15/2017 CLINICAL DATA:  Shortness of breath EXAM: PORTABLE CHEST 1 VIEW COMPARISON:  01/10/2017 FINDINGS: Sternotomy wires. Hyperinflation. Diffuse increased interstitial opacity with patchy confluent airspace disease at the bilateral lung bases. Valvular replacement. Electronic recording device over the central lower chest borderline cardiomegaly with aortic atherosclerosis. Probable skin fold artifact over the left chest. IMPRESSION:  1. Borderline cardiomegaly with mild central vascular congestion. 2. Diffuse increased interstitial opacity suggesting mild edema 3. More confluent airspace disease at the bilateral lung bases, suspicious for superimposed pneumonia. Electronically Signed   By: Donavan Foil M.D.   On: 05/15/2017 21:08        Scheduled Meds: . atorvastatin  20 mg Oral QPM  . furosemide  40 mg Intravenous BID  . gabapentin  600 mg Oral QHS  . insulin aspart  0-15 Units Subcutaneous TID WC  . ipratropium-albuterol  3 mL Nebulization TID  . LORazepam  1 mg Oral QHS  . losartan  100 mg Oral Daily  . methylPREDNISolone (SOLU-MEDROL) injection  80 mg Intravenous Q6H  . pantoprazole  40 mg Oral Daily  . potassium chloride  10 mEq Oral Daily  . propranolol  10 mg Oral QID  . Rivaroxaban  15 mg Oral Q supper  . sodium chloride flush  3 mL Intravenous Q12H   Continuous Infusions: . sodium chloride       LOS: 1 day     Vernell Leep, MD, FACP, [facility]. [facility] Pager [phone]  If 7PM-7AM, please contact night-coverage [email or web address] Password [facility] 05/16/2017, 3:04 PM

## 2017-05-16 NOTE — Progress Notes (Signed)
Pt continues to be in  A Fib with RVR ranging between 130s and 140s.  BP was . Attending Hongali, MD was notified and informed and ordered a one time bolus of 20 mg cardizem with 5 mg cardizem drip. Will continue to monitor.

## 2017-05-16 NOTE — Progress Notes (Signed)
Pt had eaten breakfast. Called the echo lab and endo about possible delay of TEE procedure.  Endo informed that TEE would be delayed.

## 2017-05-17 ENCOUNTER — Inpatient Hospital Stay (HOSPITAL_COMMUNITY): Payer: Medicare Other

## 2017-05-17 DIAGNOSIS — R9431 Abnormal electrocardiogram [ECG] [EKG]: Secondary | ICD-10-CM

## 2017-05-17 DIAGNOSIS — J9601 Acute respiratory failure with hypoxia: Secondary | ICD-10-CM

## 2017-05-17 DIAGNOSIS — I5033 Acute on chronic diastolic (congestive) heart failure: Secondary | ICD-10-CM

## 2017-05-17 LAB — BASIC METABOLIC PANEL
ANION GAP: 13 (ref 5–15)
BUN: 21 mg/dL — ABNORMAL HIGH (ref 6–20)
CHLORIDE: 104 mmol/L (ref 101–111)
CO2: 22 mmol/L (ref 22–32)
Calcium: 8.8 mg/dL — ABNORMAL LOW (ref 8.9–10.3)
Creatinine, Ser: 0.69 mg/dL (ref 0.44–1.00)
Glucose, Bld: 171 mg/dL — ABNORMAL HIGH (ref 65–99)
POTASSIUM: 3.5 mmol/L (ref 3.5–5.1)
SODIUM: 139 mmol/L (ref 135–145)

## 2017-05-17 LAB — URINE CULTURE: Culture: <10000 — AB

## 2017-05-17 LAB — GLUCOSE, CAPILLARY
GLUCOSE-CAPILLARY: 188 mg/dL — AB (ref 65–99)
Glucose-Capillary: 163 mg/dL — ABNORMAL HIGH (ref 65–99)
Glucose-Capillary: 198 mg/dL — ABNORMAL HIGH (ref 65–99)
Glucose-Capillary: 171 mg/dL — ABNORMAL HIGH (ref 65–99)

## 2017-05-17 LAB — CBC
HCT: 33.9 % — ABNORMAL LOW (ref 36.0–46.0)
HEMOGLOBIN: 11 g/dL — AB (ref 12.0–15.0)
MCH: 31.1 pg (ref 26.0–34.0)
MCHC: 32.4 g/dL (ref 30.0–36.0)
MCV: 95.8 fL (ref 78.0–100.0)
PLATELETS: 309 10*3/uL (ref 150–400)
RBC: 3.54 MIL/uL — AB (ref 3.87–5.11)
RDW: 14.6 % (ref 11.5–15.5)
WBC: 17.2 10*3/uL — ABNORMAL HIGH (ref 4.0–10.5)

## 2017-05-17 LAB — MAGNESIUM: MAGNESIUM: 2.1 mg/dL (ref 1.7–2.4)

## 2017-05-17 LAB — PATHOLOGIST SMEAR REVIEW: Path Review: REACTIVE

## 2017-05-17 MED ORDER — POTASSIUM CHLORIDE CRYS ER 20 MEQ PO TBCR
40.0000 meq | EXTENDED_RELEASE_TABLET | Freq: Every day | ORAL | Status: DC
  Administered 2017-05-17: 40 meq via ORAL
  Filled 2017-05-17: qty 2

## 2017-05-17 MED ORDER — METOPROLOL TARTRATE 5 MG/5ML IV SOLN
2.5000 mg | Freq: Once | INTRAVENOUS | Status: AC
  Administered 2017-05-17: 2.5 mg via INTRAVENOUS
  Filled 2017-05-17: qty 5

## 2017-05-17 MED ORDER — PREDNISONE 20 MG PO TABS
30.0000 mg | ORAL_TABLET | Freq: Every day | ORAL | Status: DC
  Administered 2017-05-17 – 2017-05-19 (×3): 30 mg via ORAL
  Filled 2017-05-17 (×3): qty 1

## 2017-05-17 MED ORDER — DIGOXIN 0.25 MG/ML IJ SOLN
0.2500 mg | Freq: Once | INTRAMUSCULAR | Status: AC
  Administered 2017-05-17: 0.25 mg via INTRAVENOUS
  Filled 2017-05-17: qty 1

## 2017-05-17 MED ORDER — DIGOXIN 0.25 MG/ML IJ SOLN: 0.2500 mg | Freq: Once | INTRAMUSCULAR | Status: DC

## 2017-05-17 MED ORDER — FUROSEMIDE 40 MG PO TABS
40.0000 mg | ORAL_TABLET | Freq: Every day | ORAL | Status: DC
  Administered 2017-05-18 – 2017-05-24 (×7): 40 mg via ORAL
  Filled 2017-05-17 (×7): qty 1

## 2017-05-17 MED ORDER — SODIUM CHLORIDE 0.9 % IV BOLUS (SEPSIS)
500.0000 mL | Freq: Once | INTRAVENOUS | Status: AC
  Administered 2017-05-17: 500 mL via INTRAVENOUS

## 2017-05-17 NOTE — Progress Notes (Signed)
Progress Note  Patient Name: Angela Hines Date of Encounter: 05/17/2017  Primary Cardiologist: No primary care provider on file.   Subjective   No CP, no palpitations, unaware of her AF this AM, no rest SOB  Inpatient Medications    Scheduled Meds: . atorvastatin  20 mg Oral QPM  . doxycycline  100 mg Oral Q12H  . furosemide  40 mg Intravenous BID  . gabapentin  600 mg Oral QHS  . insulin aspart  0-15 Units Subcutaneous TID WC  . ipratropium-albuterol  3 mL Nebulization TID  . LORazepam  1 mg Oral QHS  . losartan  100 mg Oral Daily  . methylPREDNISolone (SOLU-MEDROL) injection  40 mg Intravenous Q12H  . pantoprazole  40 mg Oral Daily  . potassium chloride  40 mEq Oral Daily  . propranolol  10 mg Oral QID  . Rivaroxaban  15 mg Oral Q supper  . sodium chloride flush  3 mL Intravenous Q12H   Continuous Infusions: . sodium chloride     PRN Meds: sodium chloride, albuterol, LORazepam **AND** LORazepam, sodium chloride flush, zolpidem   Vital Signs    Vitals:   05/16/17 2138 05/16/17 2352 05/17/17 0256 05/17/17 0414  BP: 103/66 98/71 97/73  (!) 89/62  Pulse: 77 (!) 111 (!) 120 (!) 126  Resp: 19 (!) 21 (!) 25 (!) 22  Temp:  98.1 F (36.7 C)  98 F (36.7 C)  TempSrc:  Oral  Oral  SpO2: 96% 96% 94% 95%  Weight:      Height:        Intake/Output Summary (Last 24 hours) at 05/17/2017 0922 Last data filed at 05/17/2017 0031 Gross per 24 hour  Intake 740 ml  Output 1800 ml  Net -1060 ml   Filed Weights   05/15/17 2041 05/16/17 0100  Weight: 110 lb (49.9 kg) 111 lb 12.4 oz (50.7 kg)    Telemetry    AFib 110-120 - Personally Reviewed  ECG    AFib 122bpm, QTc is difficult in AF, remains prolonged, 556ms by computer - Personally Reviewed  Physical Exam   GEN: No acute distress, very frail, chronically ill appearing, AAO x3 Neck: No JVD Cardiac: iRRR, tachycardic, 1/2 SM, rubs, or gallops.  Respiratory: CTA b/l. GI: Soft, nontender, non-distended  MS: No  edema; age appropriate/advanced atrophy Neuro:  Nonfocal  Psych: Normal affect   Labs    Chemistry Recent Labs  Lab 05/15/17 2035 05/15/17 2055 05/16/17 0423 05/17/17 0214  NA 137 138 136 139  K 4.6 4.7 3.7 3.5  CL 105 106 102 104  CO2 18*  --  20* 22  GLUCOSE 244* 250* 148* 171*  BUN 15 18 15  21*  CREATININE 0.73 0.50 0.59 0.69  CALCIUM 9.3  --  9.0 8.8*  PROT 7.9  --  6.7  --   ALBUMIN 3.9  --  3.2*  --   AST 31  --  23  --   ALT 25  --  22  --   ALKPHOS 108  --  87  --   BILITOT 0.5  --  0.8  --   GFRNONAA >60  --  >60 >60  GFRAA >60  --  >60 >60  ANIONGAP 14  --  14 13     Hematology Recent Labs  Lab 05/15/17 2035 05/15/17 2055 05/16/17 0423 05/17/17 0214  WBC 20.9*  --  12.8* 17.2*  RBC 4.13  --  3.52* 3.54*  HGB 13.0 15.0 10.9* 11.0*  HCT 41.5 44.0 34.2* 33.9*  MCV 100.5*  --  97.2 95.8  MCH 31.5  --  31.0 31.1  MCHC 31.3  --  31.9 32.4  RDW 14.9  --  14.4 14.6  PLT 346  --  280 309    Cardiac EnzymesNo results for input(s): TROPONINI in the last 168 hours.  Recent Labs  Lab 05/15/17 2053  TROPIPOC 0.02     BNP Recent Labs  Lab 05/15/17 2035  BNP 831.6*     DDimer No results for input(s): DDIMER in the last 168 hours.   Radiology    Dg Chest Port 1 View Result Date: 05/17/2017 CLINICAL DATA:  Congestive heart failure.  COPD. EXAM: PORTABLE CHEST 1 VIEW COMPARISON:  05/15/2017. FINDINGS: Mediastinum and hilar structures are normal. Cardiac monitor again noted. Prior cardiac valve replacement. Fractured upper sternotomy wire is noted. This is unchanged. Heart size stable. Lungs are clear. Previously identified basilar infiltrates/edema have cleared no pleural effusion or pneumothorax. IMPRESSION: 1.  Interim clearing of basilar pulmonary infiltrates/edema 2. Prior cardiac valve replacement. Stable cardiomegaly. No pulmonary venous congestion. Electronically Signed   By: Marcello Moores  Register   On: 05/17/2017 08:17     Cardiac Studies   Dr.  Johnsie Cancel noted an echo 02/01/17 EF 60-65% mean gradient 29 mmHg peak 47 mmHg Myovue No ischemia or infarct EF 65%  01/10/16 TTE LVEF 55-60% Modd-severe AS, peak gradient 55mmhg  Patient Profile     84 y.o. female with a hx of chronic diastolic VHF w/bioprosthetic AVR (2005), CHF, COPD, HTN, DM, HLD, and Paroxysmal AFib, lives at a SNF admitted with respiratory failure/SOB, AMS, thought to be pneumonia/COPD/CHF exacerbation.  Device information: MDT ILR, implanted 07/30/14 AAD Hx: Sotalol had QT prolongation at 120mg  BID dose >> current 80mg   Assessment & Plan    1. Paroxysmal AFib     CHA2DS2Vasc is 5, on Xarelto 15mg  dose      (current Calc clearance is 58, though 0.7 historically makes clearance 49)  Yesterday developed marked QT prolongation, breif episodes of torsades on telemetry D/C'd sotalol and levaquin D/c dilt gtt, and metoprolol 2gm mag ordered, propanolol, K+  Rhythm has returned to AFib, continue propanolol, will tolerate current rates today, Torsades has quieted Continue daily EKGs   2. AMS     This remains resolved     Deferred to medicine team  3. SOB, likely multifactorial     Remains only on n/c O2 now          Pneumonia, COPD,+/- CHF      Cumulatively fluid negative -1428ml      Continue management with medicine team, K+ replaced      Respiratory status remains much improrved       Persistent leukocytosis, up some again today      Do not use QT prolonging antibiotics/drugs     For questions or updates, please contact [facility] Please consult [email or web address] for contact info under Cardiology/STEMI.      Signed, Baldwin Jamaica, PA-C  05/17/2017, 9:22 AM     I have seen, examined the patient, and reviewed the above assessment and plan.  Changes to above are made where necessary.  On exam, iRRR.  Pt with interaction of Levaquin to home regimen of sotalol.  Qt appears to be improving.  At this point, may be best to consider a different AAD.   Risks and benefits of amiodarone were discussed with the patient.  She wishes to proceed with amiodarone.  Will  allow sotalol to washout another 24 hours and then start amiodarone 200mg  BID.  Would reduced to 200mg  daily in 2 weeks. Elevated WBC, AMS, and lactic acidosis unlikely to be primary CHF.  Would continue to consider infectious cause.  Primary team to manage.  Continue gentle diuresis.  Co Sign: [name], MD 05/17/2017 11:18 AM

## 2017-05-17 NOTE — Progress Notes (Addendum)
PROGRESS NOTE   Angela Hines  YDX:[id number]    DOB: Mar 13, 1933    DOA: 05/15/2017  PCP: Kelton Pillar, MD   I have briefly reviewed patients previous medical records in [facility].  Brief Narrative:  84 year old female, PMH of chronic diastolic CHF, bioprosthetic AVR, COPD, HTN, DM, HLD, PAF scheduled for ablation 1/23, recent syncopal episode presented with worsening dyspnea and confusion.  She required CPAP via EMS and then BiPAP in ED.  Admitted for acute respiratory failure with hypoxia due to suspected pneumonia.,  COPD exacerbation and CHF.  Able to come off BiPAP.  Intermittent A. fib with RVR.  EP cardiology consulted 1/23.  Slowly improving.   Assessment & Plan:   Principal Problem:   Acute and chronic respiratory failure with hypoxia (HCC) Active Problems:   COPD (chronic obstructive pulmonary disease) (HCC)   Diabetes mellitus (HCC)   Gastroesophageal reflux disease without esophagitis   Hypertension   Sepsis ([place])   Community acquired pneumonia   Respiratory failure, acute-on-chronic ([place])   1. Acute respiratory failure with hypoxia: Possibly multifactorial related to COPD exacerbation, decompensated CHF and possible pneumonia.  Treat underlying cause.  Required CPAP >BiPAP on admission. Incentive spirometry and flutter valve.  Hypoxia resolved and off oxygen. 2. COPD exacerbation: Flutter valve.  Discontinued levofloxacin due to QT prolongation and transient torsades.  Oral doxycycline.  Change IV Solu-Medrol to oral prednisone taper from 1/25. 3. Acute on chronic diastolic CHF: -1.4 L since admission.  Change IV Lasix 40 mg twice daily to 40 mg orally daily from 1/25.  2D echo 01/12/16: "Good LV pumping strength" and no EF documented.  Defer to cardiology regarding repeating 2D echo. 4. Possible HCAP: Blood cultures negative to date, MRSA PCR negative.  Leukocytosis has improved.  Discontinued vancomycin.  Discontinued levofloxacin due to prolonged QT and  transient torsades.  Not fully convinced that this is pneumonia and all her presentation may be related to CHF/COPD. Started doxycycline. Obtains swallow evaluation by speech therapy.  Follow-up chest x-ray 1/24 shows interim clearing of basilar pulmonary infiltrate/edema. 5. Paroxysmal A. Fib: CHA2DS2Vasc is 5, on Xarelto 15mg  dose.  She had a couple of hours of RVR early morning 1/23.  Started on Cardizem drip and converted to sinus rhythm.  EP cardiology consulted, stat EKG showed marked QT prolongation, noted transient torsades on 1/23.  Cardiology discontinued sotalol, levofloxacin, Cardizem drip and metoprolol.  Propranolol started.  Initial plan was to transfer to ICU by cardiology but since DNR, remained in stepdown bed.  A. fib with RVR in the 120s-130s, acceptable per EP cardiology, defer to them for management.  Overnight received a dose of digoxin. 6. Confusion/toxic encephalopathy: Probably related to medications (Benadryl, Ambien and Ativan).  Resolved. 7. Type II DM: Hold metformin.  Continue SSI.  Mildly uncontrolled.  Should improve with taper of steroids. 8. Possible sepsis versus SIRS: Present on admission.  Improved. 9. GERD: PPI.  Has intermittent heartburn symptoms. 10. Anemia: Hemoglobin dropped from 12 on 1/18-10.9.  No overt bleeding.  Hemoglobin stable today. 11. Status post bioprosthetic AVR: 12. Essential hypertension: Controlled. 13. Prolonged QTC: EKG 05/16/17 at 6:08 AM: 483 ms.  Discontinued and avoid precipitating medications.  Keep magnesium >2 and potassium >4.  QTC 541 ms.     DVT prophylaxis: Xarelto Code Status: DNR Family Communication: Discussed with son, updated care and answered questions. Disposition: DC's possibly to prior independent living pending clinical improvement, may be another 1-2 days.   Consultants:  EP cardiology  Procedures:  BiPAP  Antimicrobials:  Discontinued vancomycin and levofloxacin Oral doxycycline 1/23  >   Subjective: Reports no symptoms at rest.  No dyspnea, palpitations, chest pain or cough.  States that whenever her palpitations gets worse, this is associated with dyspnea, mild dry cough and legs feeling weak.  Currently does not appreciate palpitations despite A. fib with RVR in the 110s-130s.  ROS: As above  Objective:  Vitals:   05/16/17 2138 05/16/17 2352 05/17/17 0256 05/17/17 0414  BP: 103/66 98/71 97/73  (!) 89/62  Pulse: 77 (!) 111 (!) 120 (!) 126  Resp: 19 (!) 21 (!) 25 (!) 22  Temp:  98.1 F (36.7 C)  98 F (36.7 C)  TempSrc:  Oral  Oral  SpO2: 96% 96% 94% 95%  Weight:      Height:        Examination:  General exam: Elderly female, small built and frail, lying comfortably propped up in bed without distress or oxygen. Respiratory system: Clear to auscultation.  No increased work of breathing. Cardiovascular system: S1 and S2 heard, irregularly irregular and mildly tachycardic.  No JVD.  2/6 systolic ejection murmur at apex.  No pedal edema.  Telemetry personally reviewed: A. fib with RVR in the 120s since 11:30 PM last night. Gastrointestinal system: Abdomen is nondistended, soft and nontender. No organomegaly or masses felt. Normal bowel sounds heard.  Stable without change. Central nervous system: Alert and oriented. No focal neurological deficits.  Stable without change. Extremities: Symmetric 5 x 5 power. Skin: No rashes, lesions or ulcers Psychiatry: Judgement and insight appear normal. Mood & affect appropriate.     Data Reviewed: I have personally reviewed following labs and imaging studies  CBC: Recent Labs  Lab 05/11/17 1112 05/15/17 2035 05/15/17 2055 05/16/17 0423 05/17/17 0214  WBC 9.3 20.9*  --  12.8* 17.2*  NEUTROABS  --  12.0*  --  11.1*  --   HGB 12.0 13.0 15.0 10.9* 11.0*  HCT 37.8 41.5 44.0 34.2* 33.9*  MCV 99.2 100.5*  --  97.2 95.8  PLT 322 346  --  280 053   Basic Metabolic Panel: Recent Labs  Lab 05/11/17 1112 05/15/17 2035  05/15/17 2055 05/16/17 0423 05/16/17 1441 05/17/17 0214  NA 139 137 138 136  --  139  K 4.1 4.6 4.7 3.7  --  3.5  CL 104 105 106 102  --  104  CO2 24 18*  --  20*  --  22  GLUCOSE 119* 244* 250* 148*  --  171*  BUN 11 15 18 15   --  21*  CREATININE 0.64 0.73 0.50 0.59  --  0.69  CALCIUM 9.5 9.3  --  9.0  --  8.8*  MG 1.6*  --   --   --  2.6* 2.1   Liver Function Tests: Recent Labs  Lab 05/15/17 2035 05/16/17 0423  AST 31 23  ALT 25 22  ALKPHOS 108 87  BILITOT 0.5 0.8  PROT 7.9 6.7  ALBUMIN 3.9 3.2*   CBG: Recent Labs  Lab 05/16/17 0701 05/16/17 1141 05/16/17 1624 05/16/17 2120 05/17/17 0613  GLUCAP 154* 195* 220* 142* 163*    Recent Results (from the past 240 hour(s))  Blood Culture (routine x 2)     Status: None (Preliminary result)   Collection Time: 05/15/17  8:35 PM  Result Value Ref Range Status   Specimen Description BLOOD RIGHT ANTECUBITAL  Final   Special Requests   Final    BOTTLES DRAWN AEROBIC  AND ANAEROBIC Blood Culture adequate volume   Culture NO GROWTH < 12 HOURS  Final   Report Status PENDING  Incomplete  Blood Culture (routine x 2)     Status: None (Preliminary result)   Collection Time: 05/15/17  9:06 PM  Result Value Ref Range Status   Specimen Description BLOOD LEFT ANTECUBITAL  Final   Special Requests   Final    BOTTLES DRAWN AEROBIC AND ANAEROBIC Blood Culture adequate volume   Culture NO GROWTH < 12 HOURS  Final   Report Status PENDING  Incomplete  Urine culture     Status: Abnormal   Collection Time: 05/15/17 10:58 PM  Result Value Ref Range Status   Specimen Description URINE, CATHETERIZED  Final   Special Requests NONE  Final   Culture <10,000 COLONIES/mL INSIGNIFICANT GROWTH (A)  Final   Report Status 05/17/2017 FINAL  Final  MRSA PCR Screening     Status: None   Collection Time: 05/16/17  1:57 AM  Result Value Ref Range Status   MRSA by PCR NEGATIVE NEGATIVE Final    Comment:        The GeneXpert MRSA Assay (FDA approved  for NASAL specimens only), is one component of a comprehensive MRSA colonization surveillance program. It is not intended to diagnose MRSA infection nor to guide or monitor treatment for MRSA infections.          Radiology Studies: Dg Chest Port 1 View  Result Date: 05/17/2017 CLINICAL DATA:  Congestive heart failure.  COPD. EXAM: PORTABLE CHEST 1 VIEW COMPARISON:  05/15/2017. FINDINGS: Mediastinum and hilar structures are normal. Cardiac monitor again noted. Prior cardiac valve replacement. Fractured upper sternotomy wire is noted. This is unchanged. Heart size stable. Lungs are clear. Previously identified basilar infiltrates/edema have cleared no pleural effusion or pneumothorax. IMPRESSION: 1.  Interim clearing of basilar pulmonary infiltrates/edema 2. Prior cardiac valve replacement. Stable cardiomegaly. No pulmonary venous congestion. Electronically Signed   By: Marcello Moores  Register   On: 05/17/2017 08:17   Dg Chest Port 1 View  Result Date: 05/15/2017 CLINICAL DATA:  Shortness of breath EXAM: PORTABLE CHEST 1 VIEW COMPARISON:  01/10/2017 FINDINGS: Sternotomy wires. Hyperinflation. Diffuse increased interstitial opacity with patchy confluent airspace disease at the bilateral lung bases. Valvular replacement. Electronic recording device over the central lower chest borderline cardiomegaly with aortic atherosclerosis. Probable skin fold artifact over the left chest. IMPRESSION: 1. Borderline cardiomegaly with mild central vascular congestion. 2. Diffuse increased interstitial opacity suggesting mild edema 3. More confluent airspace disease at the bilateral lung bases, suspicious for superimposed pneumonia. Electronically Signed   By: Donavan Foil M.D.   On: 05/15/2017 21:08        Scheduled Meds: . atorvastatin  20 mg Oral QPM  . doxycycline  100 mg Oral Q12H  . furosemide  40 mg Intravenous BID  . gabapentin  600 mg Oral QHS  . insulin aspart  0-15 Units Subcutaneous TID WC  .  ipratropium-albuterol  3 mL Nebulization TID  . LORazepam  1 mg Oral QHS  . losartan  100 mg Oral Daily  . methylPREDNISolone (SOLU-MEDROL) injection  40 mg Intravenous Q12H  . pantoprazole  40 mg Oral Daily  . potassium chloride  40 mEq Oral Daily  . propranolol  10 mg Oral QID  . Rivaroxaban  15 mg Oral Q supper  . sodium chloride flush  3 mL Intravenous Q12H   Continuous Infusions: . sodium chloride       LOS: 2 days  Vernell Leep, MD, FACP, [facility]. [facility] Pager [phone]  If 7PM-7AM, please contact night-coverage [email or web address] Password [facility] 05/17/2017, 9:41 AM

## 2017-05-17 NOTE — Progress Notes (Signed)
Pt converted back into A Fib with heart rate going mostly from 110 to 135.  BP was 98/71. Pt expressed no other symptom except "pain in legs."  On call hospitalist, Chaney Malling, NP, was informed and ordered a one time digoxin 0.25 mg IV and a normal saline bolus of 500 mL at a rate of 250 mL/hr.  Will continue to monitor pt.  Lupita Dawn, RN

## 2017-05-17 NOTE — Progress Notes (Signed)
SLP Cancellation Note  Patient Details Name: Angela Hines MRN: [id number] DOB: 04/23/1933   Cancelled treatment:       Reason Eval/Treat Not Completed: Other (comment). Getting breathing treatment. Reportedly tolerating diet.    Jaise Moser, Katherene Ponto 05/17/2017, 9:13 AM

## 2017-05-17 NOTE — Discharge Instructions (Signed)

## 2017-05-17 NOTE — Progress Notes (Signed)
  Speech Language Pathology Treatment: Dysphagia  Patient Details Name: Angela Hines MRN: [id number] DOB: 1933/03/06 Today's Date: 05/17/2017 Time: 3254-9826 SLP Time Calculation (min) (ACUTE ONLY): 8 min  Assessment / Plan / Recommendation Clinical Impression  Pt tolerating thin liquids without signs of aspiration, reports her meal went well without sensation of globus or regurgitation or coughing. We reviewed basic strategies and results of recent assessments. No SLP f/u needed will sign off.   HPI HPI: 84 year old female, PMH of chronic diastolic CHF, bioprosthetic AVR, COPD, HTN, DM, HLD, PAF scheduled for ablation 1/23, recent syncopal episode presented with worsening dyspnea and confusion. She required CPAP via EMS and then BiPAP in ED. Admitted for acute respiratory failure with hypoxia due to suspected pneumonia., COPD exacerbation and CHF. Pt had an esophagram in August 2018 that showed moderate presbyesophagus and one instance of transient laryngeal penetration.      SLP Plan  All goals met       Recommendations  Diet recommendations: Regular;Thin liquid Liquids provided via: Cup;Straw Medication Administration: Whole meds with liquid Supervision: Patient able to self feed Compensations: Slow rate;Small sips/bites;Follow solids with liquid Postural Changes and/or Swallow Maneuvers: Seated upright 90 degrees;Upright 30-60 min after meal;Out of bed for meals                Oral Care Recommendations: Oral care BID Follow up Recommendations: None SLP Visit Diagnosis: Dysphagia, unspecified (R13.10) Plan: All goals met       GO               Herbie Baltimore, MA CCC-SLP [phone]  Angela Hines 05/17/2017, 10:13 AM

## 2017-05-17 NOTE — Progress Notes (Signed)
Informed hospitalist when Pt's heart rate was still going between 110 to 135 after receiving digoxin and a 500 cc bolus. Schorr, NP, said to page the cardiologist about doing anything else to control the heart rate.  On call cardiologist was paged and, after informing about the pt's condition, ordered a one time metoprolol 2.5 mg IV.  Will continue to monitor pt.  Lupita Dawn, RN

## 2017-05-18 ENCOUNTER — Other Ambulatory Visit: Payer: Self-pay

## 2017-05-18 LAB — CBC
HEMATOCRIT: 32.7 % — AB (ref 36.0–46.0)
HEMOGLOBIN: 10.5 g/dL — AB (ref 12.0–15.0)
MCH: 30.9 pg (ref 26.0–34.0)
MCHC: 32.1 g/dL (ref 30.0–36.0)
MCV: 96.2 fL (ref 78.0–100.0)
Platelets: 294 10*3/uL (ref 150–400)
RBC: 3.4 MIL/uL — AB (ref 3.87–5.11)
RDW: 14.7 % (ref 11.5–15.5)
WBC: 14.3 10*3/uL — AB (ref 4.0–10.5)

## 2017-05-18 LAB — BASIC METABOLIC PANEL
ANION GAP: 11 (ref 5–15)
BUN: 25 mg/dL — ABNORMAL HIGH (ref 6–20)
CHLORIDE: 103 mmol/L (ref 101–111)
CO2: 24 mmol/L (ref 22–32)
Calcium: 8.9 mg/dL (ref 8.9–10.3)
Creatinine, Ser: 0.56 mg/dL (ref 0.44–1.00)
GFR calc non Af Amer: >60 mL/min (ref >60)
Glucose, Bld: 126 mg/dL — ABNORMAL HIGH (ref 65–99)
Potassium: 3.5 mmol/L (ref 3.5–5.1)
Sodium: 138 mmol/L (ref 135–145)

## 2017-05-18 LAB — GLUCOSE, CAPILLARY
GLUCOSE-CAPILLARY: 128 mg/dL — AB (ref 65–99)
GLUCOSE-CAPILLARY: 172 mg/dL — AB (ref 65–99)
GLUCOSE-CAPILLARY: 243 mg/dL — AB (ref 65–99)
Glucose-Capillary: 148 mg/dL — ABNORMAL HIGH (ref 65–99)

## 2017-05-18 LAB — MAGNESIUM: Magnesium: 1.9 mg/dL (ref 1.7–2.4)

## 2017-05-18 MED ORDER — DIGOXIN 0.25 MG/ML IJ SOLN
0.2500 mg | Freq: Every day | INTRAMUSCULAR | Status: AC
  Administered 2017-05-18 – 2017-05-19 (×2): 0.25 mg via INTRAVENOUS
  Filled 2017-05-18 (×2): qty 1

## 2017-05-18 MED ORDER — METOPROLOL TARTRATE 25 MG PO TABS
25.0000 mg | ORAL_TABLET | Freq: Two times a day (BID) | ORAL | Status: DC
  Administered 2017-05-18 (×2): 25 mg via ORAL
  Filled 2017-05-18 (×2): qty 1

## 2017-05-18 MED ORDER — POTASSIUM CHLORIDE CRYS ER 20 MEQ PO TBCR
40.0000 meq | EXTENDED_RELEASE_TABLET | Freq: Two times a day (BID) | ORAL | Status: DC
  Administered 2017-05-18 – 2017-05-24 (×13): 40 meq via ORAL
  Filled 2017-05-18 (×13): qty 2

## 2017-05-18 MED ORDER — LEVALBUTEROL HCL 0.63 MG/3ML IN NEBU
0.6300 mg | INHALATION_SOLUTION | Freq: Four times a day (QID) | RESPIRATORY_TRACT | Status: DC | PRN
Start: 2017-05-18 — End: 2017-05-24

## 2017-05-18 MED ORDER — DIGOXIN 0.25 MG/ML IJ SOLN
0.1250 mg | Freq: Once | INTRAMUSCULAR | Status: AC
  Administered 2017-05-18: 0.125 mg via INTRAVENOUS
  Filled 2017-05-18: qty 0.5

## 2017-05-18 NOTE — Progress Notes (Signed)
05/18/2017 1710 Pt's HR remains high, sent another page to EP NP.  Will give Ativan 1mg  PO, pt stated she was very concerned about her son. [place]ney [place]ers

## 2017-05-18 NOTE — Progress Notes (Signed)
05/18/2017 1750 HR still elevated despite the administration of Ativan, pt remains asymptomatic.  DR. Allred paged. [place]

## 2017-05-18 NOTE — Progress Notes (Signed)
05/18/2017 1650 Pt's HR has been sustaining 130's-140's.  Pt is asymptomatic but did just receive news that her son has been admitted to hospital.  BP is stable.  NO PRN's available to give for HR.  Page sent to EP NP. [place]

## 2017-05-18 NOTE — Progress Notes (Signed)
05/18/2017 1815 Dr. Oval Linsey paged and call back received.  Orders received to give 0.125mg  IV Digoxin and to repeat dose in 8 hrs. [place]

## 2017-05-18 NOTE — Progress Notes (Signed)
05/18/2017 1810 No call back from pages sent to EP.  Pt's HR remains 140's.  Will page DR. Hongalgi for guidance.  Received call back from Dr. Algis Liming who instructs me to notify DR. Oval Linsey who is on call for CARDS. [place]

## 2017-05-18 NOTE — Clinical Social Work Note (Signed)
CSW spoke with pt at bedside. Pt is from [place]. Pt states she may need to go to the ALF or SNF at [place]. Pt is agreeable to explore the other levels as the PT recommends. Pt states her son "takes care of my business." Pt would like CSW to reach out to son and work with him when a recommendation is made. CSW will continue to follow to determine what level of care will be needed at d/c.   [place], [place]

## 2017-05-18 NOTE — Progress Notes (Signed)
PROGRESS NOTE   Angela Hines  VHQ:[id number]    DOB: Aug 19, 1932    DOA: 05/15/2017  PCP: Kelton Pillar, MD   I have briefly reviewed patients previous medical records in [facility].  Brief Narrative:  84 year old female, PMH of chronic diastolic CHF, bioprosthetic AVR, COPD, HTN, DM, HLD, PAF scheduled for ablation 1/23, recent syncopal episode presented with worsening dyspnea and confusion.  She required CPAP via EMS and then BiPAP in ED.  Admitted for acute respiratory failure with hypoxia due to suspected pneumonia.,  COPD exacerbation and CHF.  Able to come off BiPAP.  Intermittent A. fib with RVR.  EP cardiology consulted 1/23.  Slowly improving.   Assessment & Plan:   Principal Problem:   Acute and chronic respiratory failure with hypoxia (HCC) Active Problems:   COPD with acute exacerbation (HCC)   Diabetes mellitus ([place])   Gastroesophageal reflux disease without esophagitis   Hypertension   AF (paroxysmal atrial fibrillation) (HCC)   Sepsis ([place])   Community acquired pneumonia   Acute respiratory failure with hypoxia (HCC)   Acute on chronic diastolic CHF (congestive heart failure) (HCC)   Prolonged QT interval   1. Acute respiratory failure with hypoxia: Possibly multifactorial related to COPD exacerbation, decompensated CHF and possible pneumonia.  Treat underlying cause.  Required CPAP >BiPAP on admission. Incentive spirometry and flutter valve.  Hypoxia resolved and off oxygen. 2. COPD exacerbation: Flutter valve.  Discontinued levofloxacin due to QT prolongation and transient torsades.  Oral doxycycline.  Changed IV Solu-Medrol to oral prednisone taper.  No clinical bronchospasm. 3. Acute on chronic diastolic CHF: - 2.5  L since admission.  Changed IV Lasix 40 mg twice daily to 40 mg orally daily from 1/25.  2D echo 01/12/16: "Good LV pumping strength" and no EF documented.  Defer to cardiology regarding repeating 2D echo. 4. Possible HCAP: Blood cultures  negative to date, MRSA PCR negative.  Leukocytosis has improved.  Discontinued vancomycin.  Discontinued levofloxacin due to prolonged QT and transient torsades.  Not fully convinced that this is pneumonia and all her presentation may be related to CHF/COPD. Started doxycycline.  Speech therapy recommends regular diet and thin liquids. Follow-up chest x-ray 1/24 shows interim clearing of basilar pulmonary infiltrate/edema. 5. Paroxysmal A. Fib: CHA2DS2Vasc is 5, on Xarelto 15mg  dose (reduced dose due to decreased creatinine clearance).  She had a couple of hours of RVR early morning 1/23.  Started on Cardizem drip and converted to sinus rhythm.  EP cardiology consulted, stat EKG showed marked QT prolongation, noted transient torsades on 1/23.  Cardiology discontinued sotalol, levofloxacin, Cardizem drip and metoprolol.  Discussed in detail with EP cardiology/Dr. Lovena Le: Input appreciated and have changed propranolol to metoprolol + digoxin.  Avoiding amiodarone due to prolonged QT/torsades.  Titrate medications as needed over the weekend. 6. Confusion/toxic encephalopathy: Probably related to medications (Benadryl, Ambien and Ativan).  Resolved. 7. Type II DM: Hold metformin.  Continue SSI.  Mildly uncontrolled.  Should improve with taper of steroids. 8. Possible sepsis versus SIRS: Present on admission.  Improved. 9. GERD: PPI.  Has intermittent heartburn symptoms. 10. Anemia: Hemoglobin dropped from 12 on 1/18-10.9.  No overt bleeding.  Hemoglobin stable today. 11. Status post bioprosthetic AVR: 12. Essential hypertension: Controlled. 13. Prolonged QTC: EKG 05/16/17 at 6:08 AM: 483 ms.  Discontinued and avoid precipitating medications.  Keep magnesium >2 and potassium >4.  QTC 445 ms.  Increased K Dur to 40 Meq bid. 14. Leukocytosis: Likely related to steroid use, improving.  DVT prophylaxis: Xarelto Code Status: DNR Family Communication: None at bedside. Disposition: DC to ALF versus SNF  pending clinical improvement.  Consultants:  EP cardiology  Procedures:  BiPAP-discontinued  Antimicrobials:  Discontinued vancomycin and levofloxacin Oral doxycycline 1/23 >   Subjective: Denies complaints.  No palpitations, dizziness or lightheadedness.  Dyspnea resolved.  No cough reported.  As per RN, no acute issues noted.  ROS: As above  Objective:  Vitals:   05/18/17 0928 05/18/17 1100 05/18/17 1303 05/18/17 1600  BP: 109/78  (!) 114/91 104/67  Pulse: (!) 110 (!) 105 (!) 35 (!) 45  Resp:  (!) 23 (!) 24 (!) 25  Temp:   98.3 F (36.8 C) (!) 97.4 F (36.3 C)  TempSrc:   Oral Oral  SpO2:  91% 98% 99%  Weight:      Height:        Examination:  General exam: Elderly female, small built and frail, lying comfortably propped up in bed without distress or oxygen. Respiratory system: Clear to auscultation.  No increased work of breathing.  Stable without change. Cardiovascular system: S1 and S2 heard, irregularly irregular and mildly tachycardic.  No JVD.  2/6 systolic ejection murmur at apex.  No pedal edema.  Telemetry personally reviewed: A. fib, BBB morphology with RVR in the 110s-130s. Gastrointestinal system: Abdomen is nondistended, soft and nontender. No organomegaly or masses felt. Normal bowel sounds heard.  Stable without change. Central nervous system: Alert and oriented. No focal neurological deficits.  Stable without change. Extremities: Symmetric 5 x 5 power. Skin: No rashes, lesions or ulcers Psychiatry: Judgement and insight appear normal. Mood & affect appropriate.     Data Reviewed: I have personally reviewed following labs and imaging studies  CBC: Recent Labs  Lab 05/15/17 2035 05/15/17 2055 05/16/17 0423 05/17/17 0214 05/18/17 0309  WBC 20.9*  --  12.8* 17.2* 14.3*  NEUTROABS 12.0*  --  11.1*  --   --   HGB 13.0 15.0 10.9* 11.0* 10.5*  HCT 41.5 44.0 34.2* 33.9* 32.7*  MCV 100.5*  --  97.2 95.8 96.2  PLT 346  --  280 309 315   Basic  Metabolic Panel: Recent Labs  Lab 05/15/17 2035 05/15/17 2055 05/16/17 0423 05/16/17 1441 05/17/17 0214 05/18/17 0309  NA 137 138 136  --  139 138  K 4.6 4.7 3.7  --  3.5 3.5  CL 105 106 102  --  104 103  CO2 18*  --  20*  --  22 24  GLUCOSE 244* 250* 148*  --  171* 126*  BUN 15 18 15   --  21* 25*  CREATININE 0.73 0.50 0.59  --  0.69 0.56  CALCIUM 9.3  --  9.0  --  8.8* 8.9  MG  --   --   --  2.6* 2.1 1.9   Liver Function Tests: Recent Labs  Lab 05/15/17 2035 05/16/17 0423  AST 31 23  ALT 25 22  ALKPHOS 108 87  BILITOT 0.5 0.8  PROT 7.9 6.7  ALBUMIN 3.9 3.2*   CBG: Recent Labs  Lab 05/17/17 1623 05/17/17 2131 05/18/17 0613 05/18/17 1119 05/18/17 1623  GLUCAP 171* 188* 128* 172* 148*    Recent Results (from the past 240 hour(s))  Blood Culture (routine x 2)     Status: None (Preliminary result)   Collection Time: 05/15/17  8:35 PM  Result Value Ref Range Status   Specimen Description BLOOD RIGHT ANTECUBITAL  Final   Special Requests  Final    BOTTLES DRAWN AEROBIC AND ANAEROBIC Blood Culture adequate volume   Culture NO GROWTH 3 DAYS  Final   Report Status PENDING  Incomplete  Blood Culture (routine x 2)     Status: None (Preliminary result)   Collection Time: 05/15/17  9:06 PM  Result Value Ref Range Status   Specimen Description BLOOD LEFT ANTECUBITAL  Final   Special Requests   Final    BOTTLES DRAWN AEROBIC AND ANAEROBIC Blood Culture adequate volume   Culture NO GROWTH 3 DAYS  Final   Report Status PENDING  Incomplete  Urine culture     Status: Abnormal   Collection Time: 05/15/17 10:58 PM  Result Value Ref Range Status   Specimen Description URINE, CATHETERIZED  Final   Special Requests NONE  Final   Culture <10,000 COLONIES/mL INSIGNIFICANT GROWTH (A)  Final   Report Status 05/17/2017 FINAL  Final  MRSA PCR Screening     Status: None   Collection Time: 05/16/17  1:57 AM  Result Value Ref Range Status   MRSA by PCR NEGATIVE NEGATIVE Final     Comment:        The GeneXpert MRSA Assay (FDA approved for NASAL specimens only), is one component of a comprehensive MRSA colonization surveillance program. It is not intended to diagnose MRSA infection nor to guide or monitor treatment for MRSA infections.          Radiology Studies: Dg Chest Port 1 View  Result Date: 05/17/2017 CLINICAL DATA:  Congestive heart failure.  COPD. EXAM: PORTABLE CHEST 1 VIEW COMPARISON:  05/15/2017. FINDINGS: Mediastinum and hilar structures are normal. Cardiac monitor again noted. Prior cardiac valve replacement. Fractured upper sternotomy wire is noted. This is unchanged. Heart size stable. Lungs are clear. Previously identified basilar infiltrates/edema have cleared no pleural effusion or pneumothorax. IMPRESSION: 1.  Interim clearing of basilar pulmonary infiltrates/edema 2. Prior cardiac valve replacement. Stable cardiomegaly. No pulmonary venous congestion. Electronically Signed   By: Marcello Moores  Register   On: 05/17/2017 08:17        Scheduled Meds: . atorvastatin  20 mg Oral QPM  . digoxin  0.25 mg Intravenous Daily  . doxycycline  100 mg Oral Q12H  . furosemide  40 mg Oral Daily  . gabapentin  600 mg Oral QHS  . insulin aspart  0-15 Units Subcutaneous TID WC  . ipratropium-albuterol  3 mL Nebulization TID  . LORazepam  1 mg Oral QHS  . losartan  100 mg Oral Daily  . metoprolol tartrate  25 mg Oral BID  . pantoprazole  40 mg Oral Daily  . potassium chloride  40 mEq Oral BID  . predniSONE  30 mg Oral Q breakfast  . Rivaroxaban  15 mg Oral Q supper  . sodium chloride flush  3 mL Intravenous Q12H   Continuous Infusions: . sodium chloride       LOS: 3 days     Vernell Leep, MD, FACP, [facility]. [facility] Pager [phone]  If 7PM-7AM, please contact night-coverage [email or web address] Password [facility] 05/18/2017, 4:37 PM

## 2017-05-18 NOTE — Care Management Important Message (Signed)
Important Message  Patient Details  Name: Angela Hines MRN: [id number] Date of Birth: 02-04-1933   Medicare Important Message Given:  Yes    Carles Collet, RN 05/18/2017, 11:00 AM

## 2017-05-18 NOTE — Progress Notes (Signed)
Progress Note  Patient Name: Angela Hines Date of Encounter: 05/18/2017  Primary Cardiologist: Dr. Johnsie Cancel Electrophysiologist: Dr. Rayann Heman  Subjective   No CP, no palpitations, unaware of her AF this AM, no rest SOB  Inpatient Medications    Scheduled Meds: . atorvastatin  20 mg Oral QPM  . digoxin  0.25 mg Intravenous Daily  . doxycycline  100 mg Oral Q12H  . furosemide  40 mg Oral Daily  . gabapentin  600 mg Oral QHS  . insulin aspart  0-15 Units Subcutaneous TID WC  . ipratropium-albuterol  3 mL Nebulization TID  . LORazepam  1 mg Oral QHS  . losartan  100 mg Oral Daily  . metoprolol tartrate  25 mg Oral BID  . pantoprazole  40 mg Oral Daily  . potassium chloride  40 mEq Oral BID  . predniSONE  30 mg Oral Q breakfast  . Rivaroxaban  15 mg Oral Q supper  . sodium chloride flush  3 mL Intravenous Q12H   Continuous Infusions: . sodium chloride     PRN Meds: sodium chloride, albuterol, LORazepam **AND** LORazepam, sodium chloride flush, zolpidem   Vital Signs    Vitals:   05/17/17 1943 05/17/17 2211 05/18/17 0056 05/18/17 0506  BP: 121/68 114/79 92/70 120/85  Pulse: (!) 124 (!) 140 (!) 123 87  Resp: (!) 28  (!) 22 (!) 24  Temp: 97.6 F (36.4 C)  97.9 F (36.6 C) 98.3 F (36.8 C)  TempSrc: Oral  Oral Oral  SpO2: 98%  96% 97%  Weight:      Height:        Intake/Output Summary (Last 24 hours) at 05/18/2017 0914 Last data filed at 05/18/2017 0806 Gross per 24 hour  Intake 290.34 ml  Output 1650 ml  Net -1359.66 ml   Filed Weights   05/15/17 2041 05/16/17 0100  Weight: 110 lb (49.9 kg) 111 lb 12.4 oz (50.7 kg)    Telemetry    AFib 110-120 - Personally Reviewed  ECG    AFib 107bpm,  QTc is difficult in AF, looks improved, 449ms reported - Personally Reviewed  Physical Exam   Exam is unchanged GEN: No acute distress, very frail, chronically ill appearing, AAO x3 Neck: No JVD Cardiac: iRRR, tachycardic, 1/2 SM, rubs, or gallops.  Respiratory:  CTA b/l. GI: Soft, nontender, non-distended  MS: No edema; age appropriate/advanced atrophy Neuro:  Nonfocal  Psych: Normal affect   Labs    Chemistry Recent Labs  Lab 05/15/17 2035  05/16/17 0423 05/17/17 0214 05/18/17 0309  NA 137   < > 136 139 138  K 4.6   < > 3.7 3.5 3.5  CL 105   < > 102 104 103  CO2 18*  --  20* 22 24  GLUCOSE 244*   < > 148* 171* 126*  BUN 15   < > 15 21* 25*  CREATININE 0.73   < > 0.59 0.69 0.56  CALCIUM 9.3  --  9.0 8.8* 8.9  PROT 7.9  --  6.7  --   --   ALBUMIN 3.9  --  3.2*  --   --   AST 31  --  23  --   --   ALT 25  --  22  --   --   ALKPHOS 108  --  87  --   --   BILITOT 0.5  --  0.8  --   --   GFRNONAA >60  --  >60 >60 >  60  GFRAA >60  --  >60 >60 >60  ANIONGAP 14  --  14 13 11    < > = values in this interval not displayed.     Hematology Recent Labs  Lab 05/16/17 0423 05/17/17 0214 05/18/17 0309  WBC 12.8* 17.2* 14.3*  RBC 3.52* 3.54* 3.40*  HGB 10.9* 11.0* 10.5*  HCT 34.2* 33.9* 32.7*  MCV 97.2 95.8 96.2  MCH 31.0 31.1 30.9  MCHC 31.9 32.4 32.1  RDW 14.4 14.6 14.7  PLT 280 309 294    Cardiac EnzymesNo results for input(s): TROPONINI in the last 168 hours.  Recent Labs  Lab 05/15/17 2053  TROPIPOC 0.02     BNP Recent Labs  Lab 05/15/17 2035  BNP 831.6*     DDimer No results for input(s): DDIMER in the last 168 hours.   Radiology    Dg Chest Port 1 View Result Date: 05/17/2017 CLINICAL DATA:  Congestive heart failure.  COPD. EXAM: PORTABLE CHEST 1 VIEW COMPARISON:  05/15/2017. FINDINGS: Mediastinum and hilar structures are normal. Cardiac monitor again noted. Prior cardiac valve replacement. Fractured upper sternotomy wire is noted. This is unchanged. Heart size stable. Lungs are clear. Previously identified basilar infiltrates/edema have cleared no pleural effusion or pneumothorax. IMPRESSION: 1.  Interim clearing of basilar pulmonary infiltrates/edema 2. Prior cardiac valve replacement. Stable cardiomegaly. No  pulmonary venous congestion. Electronically Signed   By: Marcello Moores  Register   On: 05/17/2017 08:17     Cardiac Studies   Dr. Johnsie Cancel noted an echo 02/01/17 EF 60-65% mean gradient 29 mmHg peak 47 mmHg Myovue No ischemia or infarct EF 65%  01/10/16 TTE LVEF 55-60% Modd-severe AS, peak gradient 46mmhg  Patient Profile     84 y.o. female with a hx of chronic diastolic VHF w/bioprosthetic AVR (2005), CHF, COPD, HTN, DM, HLD, and Paroxysmal AFib, lives at a SNF admitted with respiratory failure/SOB, AMS, thought to be pneumonia/COPD/CHF exacerbation.  Developed marked QT prolongation with Levaquin/sotalol combination with brief episodes of torsades.    Device information: MDT ILR, implanted 07/30/14 AAD Hx: Sotalol had QT prolongation at 120mg  BID dose >> current 80mg   Assessment & Plan    1. Paroxysmal AFib     CHA2DS2Vasc is 5, on Xarelto 15mg  dose      clearance has been 47-48 previously, given advanced age and frail body habitus, agree with low dose  05/16/17 developed marked QT prolongation, brief episodes of torsades on telemetry D/C'd sotalol and levaquin  QT is improved.  Rate uncontrolled Stop propanolol, start lopressor and dig today, BP looks a little better, follow and titrate as able In d/w Dr. Lovena Le, reservation of using K+ channel blocker of any kind, we will plan for rate control strategy for now at least.  Pt reports a slight amount of blood on her tissue yesterday when blowing her nose, no overt epistaxis, continue her Xarelto, low dose is appropriate.  2. AMS     This remains resolved     Deferred to medicine team  3. SOB, likely multifactorial     Remains only on n/c O2 now         ? Pneumonia, COPD,+/- CHF      Cumulatively fluid negative -2744ml, agree with change to PO lasix      Respiratory status remains much improrved       Persistent leukocytosis, waxing/waning thought to be 2/2 steroid      D/w medicine, low suspicion of pneumonia/infection      Do  not  use QT prolonging antibiotics/drugs   For questions or updates, please contact [place] Please consult [email or web address] for contact info under Cardiology/STEMI.      Signed, Baldwin Jamaica, PA-C  05/18/2017, 9:14 AM     EP attending  Patient seen and examined. Agree with above. The patient is stable but her rate in atrial fib is still too fast. I have recommended avoidance of all AA drugs, especially potassium channel blocking agents. We will continue to attempt to uptitrate her AV nodal blocking drugs. Her pressure is better.   Mikle Bosworth.D.

## 2017-05-19 LAB — CBC
HCT: 37 % (ref 36.0–46.0)
HEMOGLOBIN: 11.8 g/dL — AB (ref 12.0–15.0)
MCH: 30.9 pg (ref 26.0–34.0)
MCHC: 31.9 g/dL (ref 30.0–36.0)
MCV: 96.9 fL (ref 78.0–100.0)
PLATELETS: 314 10*3/uL (ref 150–400)
RBC: 3.82 MIL/uL — AB (ref 3.87–5.11)
RDW: 14.6 % (ref 11.5–15.5)
WBC: 12.4 10*3/uL — ABNORMAL HIGH (ref 4.0–10.5)

## 2017-05-19 LAB — BASIC METABOLIC PANEL
Anion gap: 11 (ref 5–15)
BUN: 21 mg/dL — AB (ref 6–20)
CO2: 23 mmol/L (ref 22–32)
Calcium: 9.1 mg/dL (ref 8.9–10.3)
Chloride: 102 mmol/L (ref 101–111)
Creatinine, Ser: 0.69 mg/dL (ref 0.44–1.00)
GFR calc Af Amer: >60 mL/min (ref >60)
GLUCOSE: 130 mg/dL — AB (ref 65–99)
POTASSIUM: 4.3 mmol/L (ref 3.5–5.1)
Sodium: 136 mmol/L (ref 135–145)

## 2017-05-19 LAB — GLUCOSE, CAPILLARY
GLUCOSE-CAPILLARY: 133 mg/dL — AB (ref 65–99)
GLUCOSE-CAPILLARY: 178 mg/dL — AB (ref 65–99)
Glucose-Capillary: 129 mg/dL — ABNORMAL HIGH (ref 65–99)
Glucose-Capillary: 238 mg/dL — ABNORMAL HIGH (ref 65–99)

## 2017-05-19 MED ORDER — METOPROLOL TARTRATE 25 MG PO TABS: 37.5000 mg | ORAL_TABLET | Freq: Two times a day (BID) | ORAL | Status: DC

## 2017-05-19 MED ORDER — METOPROLOL TARTRATE 25 MG PO TABS
37.5000 mg | ORAL_TABLET | Freq: Two times a day (BID) | ORAL | Status: DC
  Administered 2017-05-19 – 2017-05-20 (×3): 37.5 mg via ORAL
  Filled 2017-05-19 (×3): qty 1

## 2017-05-19 MED ORDER — PREDNISONE 20 MG PO TABS
20.0000 mg | ORAL_TABLET | Freq: Every day | ORAL | Status: DC
  Administered 2017-05-20 – 2017-05-23 (×4): 20 mg via ORAL
  Filled 2017-05-19 (×4): qty 1

## 2017-05-19 NOTE — Plan of Care (Signed)
  Progressing Activity: Ability to tolerate increased activity will improve 05/19/2017 0002 - Progressing by Peggye Pitt, RN Pain Managment: General experience of comfort will improve 05/19/2017 0002 - Progressing by Peggye Pitt, RN   Completed/Met Clinical Measurements: Ability to maintain a body temperature in the normal range will improve 05/19/2017 0002 - Completed/Met by Peggye Pitt, RN Respiratory: Ability to maintain a clear airway will improve 05/19/2017 0002 - Completed/Met by Peggye Pitt, RN

## 2017-05-19 NOTE — Progress Notes (Addendum)
PROGRESS NOTE   Angela Hines  XKG:[id number]    DOB: 02/28/1933    DOA: 05/15/2017  PCP: Kelton Pillar, MD   I have briefly reviewed patients previous medical records in [facility].  Brief Narrative:  84 year old female, PMH of chronic diastolic CHF, bioprosthetic AVR, COPD, HTN, DM, HLD, PAF scheduled for ablation 1/23, recent syncopal episode presented with worsening dyspnea and confusion.  She required CPAP via EMS and then BiPAP in ED.  Admitted for acute respiratory failure with hypoxia due to suspected pneumonia.,  COPD exacerbation and CHF.  Able to come off BiPAP.  Intermittent A. fib with RVR.  EP cardiology consulted 1/23.  Slowly improving.   Assessment & Plan:   Principal Problem:   Acute and chronic respiratory failure with hypoxia (HCC) Active Problems:   COPD with acute exacerbation (HCC)   Diabetes mellitus ([place])   Gastroesophageal reflux disease without esophagitis   Hypertension   AF (paroxysmal atrial fibrillation) (HCC)   Sepsis ([place])   Community acquired pneumonia   Acute respiratory failure with hypoxia (HCC)   Acute on chronic diastolic CHF (congestive heart failure) (HCC)   Prolonged QT interval   1. Acute respiratory failure with hypoxia: Possibly multifactorial related to COPD exacerbation, decompensated CHF and possible pneumonia.  Treat underlying cause.  Required CPAP >BiPAP on admission. Incentive spirometry and flutter valve.  Hypoxia resolved and off oxygen. 2. COPD exacerbation: Flutter valve.  Discontinued levofloxacin due to QT prolongation and transient torsades.  Oral doxycycline.  Changed IV Solu-Medrol to oral prednisone taper, reduced to 20 mg daily.  No clinical bronchospasm. 3. Acute on chronic diastolic CHF: - 2.6  L since admission.  Changed IV Lasix 40 mg twice daily to 40 mg orally daily from 1/25.  2D echo 01/12/16: "Good LV pumping strength" and no EF documented.   4. Possible HCAP: Blood cultures negative to date, MRSA PCR  negative.  Leukocytosis has improved.  Discontinued vancomycin.  Discontinued levofloxacin due to prolonged QT and transient torsades.  Not fully convinced that this is pneumonia and all her presentation may be related to CHF/COPD. Started doxycycline.  Speech therapy recommends regular diet and thin liquids. Follow-up chest x-ray 1/24 showed interim clearing of basilar pulmonary infiltrate/edema. 5. Paroxysmal A. Fib: CHA2DS2Vasc is 5, on Xarelto 15mg  dose (reduced dose due to decreased creatinine clearance). She had a couple of hours of RVR early morning 1/23.  Started on Cardizem drip and converted to sinus rhythm. EP cardiology consulted, stat EKG showed marked QT prolongation, noted transient torsades on 1/23.  Cardiology discontinued sotalol, levofloxacin, Cardizem drip and metoprolol.  Heart rates are better but still has periods of RVR up to 140-160 this morning.  Cardiology follow-up appreciated and have changed propranolol to metoprolol (increasing dose to 37.5 mg bid) + digoxin.  Avoiding amiodarone due to prolonged QT/torsades. Titrate medications as needed over the weekend.  This is her barrier for discharge at this time and cardiology is managing. 6. Confusion/toxic encephalopathy: Probably related to medications (Benadryl, Ambien and Ativan).  Resolved. 7. Type II DM: Hold metformin.  Continue SSI.  Mildly uncontrolled.  Should improve with taper of steroids. 8. Possible sepsis versus SIRS: Present on admission.  Resolved. 9. GERD: PPI.  Has intermittent heartburn symptoms. 10. Anemia: Hemoglobin dropped from 12 on 1/18-10.9.  No overt bleeding.  Hemoglobin stable today. 11. Status post bioprosthetic AVR: 12. Essential hypertension: Controlled. 13. Prolonged QTC: EKG 05/16/17 at 6:08 AM: 483 ms.  Discontinued and avoid precipitating medications.  Keep  magnesium >2 and potassium >4.  QTC 445 ms on 1/25.  Increased K Dur to 40 Meq bid. 14. Leukocytosis: Likely related to steroid use,  improving.     DVT prophylaxis: Xarelto Code Status: DNR Family Communication: None at bedside. Disposition: DC to ALF versus SNF pending clinical improvement.  Request PT evaluation to see if she needs any higher level of care at discharge.  She is from [place].  Consultants:  EP cardiology  Procedures:  BiPAP-discontinued  Antimicrobials:  Discontinued vancomycin and levofloxacin Oral doxycycline 1/23 >   Subjective: No complaints reported.  No chest pain, dyspnea or palpitations.  ROS: As above  Objective:  Vitals:   05/19/17 0000 05/19/17 0530 05/19/17 0744 05/19/17 0841  BP: 100/75 109/84 117/87   Pulse: (!) 120 89 82   Resp: (!) 25 (!) 21 20   Temp: 98.2 F (36.8 C) 98.1 F (36.7 C) 97.6 F (36.4 C)   TempSrc: Oral Oral Oral   SpO2: 97% 97% 98% 99%  Weight:      Height:        Examination:  General exam: Elderly female, small built and frail, lying comfortably propped up in bed without distress or oxygen. Respiratory system: Clear to auscultation.  No increased work of breathing.  Stable without change. Cardiovascular system: S1 and S2 heard, irregularly irregular and mildly tachycardic.  No JVD.  2/6 systolic ejection murmur at apex.  No pedal edema.  Telemetry personally reviewed: Noted this morning with A. fib with RVR in the 140s-160s.  Although the heart rates are better compared to yesterday, still not consistently controlled. Gastrointestinal system: Abdomen is nondistended, soft and nontender. No organomegaly or masses felt. Normal bowel sounds heard.  Stable without change. Central nervous system: Alert and oriented. No focal neurological deficits.  Stable without change. Extremities: Symmetric 5 x 5 power. Skin: No rashes, lesions or ulcers Psychiatry: Judgement and insight appear normal. Mood & affect appropriate.     Data Reviewed: I have personally reviewed following labs and imaging studies  CBC: Recent Labs  Lab 05/15/17 2035  05/15/17 2055 05/16/17 0423 05/17/17 0214 05/18/17 0309 05/19/17 0337  WBC 20.9*  --  12.8* 17.2* 14.3* 12.4*  NEUTROABS 12.0*  --  11.1*  --   --   --   HGB 13.0 15.0 10.9* 11.0* 10.5* 11.8*  HCT 41.5 44.0 34.2* 33.9* 32.7* 37.0  MCV 100.5*  --  97.2 95.8 96.2 96.9  PLT 346  --  280 309 294 761   Basic Metabolic Panel: Recent Labs  Lab 05/15/17 2035 05/15/17 2055 05/16/17 0423 05/16/17 1441 05/17/17 0214 05/18/17 0309 05/19/17 0337  NA 137 138 136  --  139 138 136  K 4.6 4.7 3.7  --  3.5 3.5 4.3  CL 105 106 102  --  104 103 102  CO2 18*  --  20*  --  22 24 23   GLUCOSE 244* 250* 148*  --  171* 126* 130*  BUN 15 18 15   --  21* 25* 21*  CREATININE 0.73 0.50 0.59  --  0.69 0.56 0.69  CALCIUM 9.3  --  9.0  --  8.8* 8.9 9.1  MG  --   --   --  2.6* 2.1 1.9  --    Liver Function Tests: Recent Labs  Lab 05/15/17 2035 05/16/17 0423  AST 31 23  ALT 25 22  ALKPHOS 108 87  BILITOT 0.5 0.8  PROT 7.9 6.7  ALBUMIN 3.9 3.2*  CBG: Recent Labs  Lab 05/18/17 0613 05/18/17 1119 05/18/17 1623 05/18/17 2126 05/19/17 0640  GLUCAP 128* 172* 148* 243* 133*    Recent Results (from the past 240 hour(s))  Blood Culture (routine x 2)     Status: None (Preliminary result)   Collection Time: 05/15/17  8:35 PM  Result Value Ref Range Status   Specimen Description BLOOD RIGHT ANTECUBITAL  Final   Special Requests   Final    BOTTLES DRAWN AEROBIC AND ANAEROBIC Blood Culture adequate volume   Culture NO GROWTH 3 DAYS  Final   Report Status PENDING  Incomplete  Blood Culture (routine x 2)     Status: None (Preliminary result)   Collection Time: 05/15/17  9:06 PM  Result Value Ref Range Status   Specimen Description BLOOD LEFT ANTECUBITAL  Final   Special Requests   Final    BOTTLES DRAWN AEROBIC AND ANAEROBIC Blood Culture adequate volume   Culture NO GROWTH 3 DAYS  Final   Report Status PENDING  Incomplete  Urine culture     Status: Abnormal   Collection Time: 05/15/17 10:58  PM  Result Value Ref Range Status   Specimen Description URINE, CATHETERIZED  Final   Special Requests NONE  Final   Culture <10,000 COLONIES/mL INSIGNIFICANT GROWTH (A)  Final   Report Status 05/17/2017 FINAL  Final  MRSA PCR Screening     Status: None   Collection Time: 05/16/17  1:57 AM  Result Value Ref Range Status   MRSA by PCR NEGATIVE NEGATIVE Final    Comment:        The GeneXpert MRSA Assay (FDA approved for NASAL specimens only), is one component of a comprehensive MRSA colonization surveillance program. It is not intended to diagnose MRSA infection nor to guide or monitor treatment for MRSA infections.          Radiology Studies: No results found.      Scheduled Meds: . atorvastatin  20 mg Oral QPM  . doxycycline  100 mg Oral Q12H  . furosemide  40 mg Oral Daily  . gabapentin  600 mg Oral QHS  . insulin aspart  0-15 Units Subcutaneous TID WC  . ipratropium-albuterol  3 mL Nebulization TID  . LORazepam  1 mg Oral QHS  . losartan  100 mg Oral Daily  . metoprolol tartrate  37.5 mg Oral BID  . pantoprazole  40 mg Oral Daily  . potassium chloride  40 mEq Oral BID  . predniSONE  30 mg Oral Q breakfast  . Rivaroxaban  15 mg Oral Q supper  . sodium chloride flush  3 mL Intravenous Q12H   Continuous Infusions: . sodium chloride       LOS: 4 days     Vernell Leep, MD, FACP, [facility]. [facility] Pager [phone]  If 7PM-7AM, please contact night-coverage [email or web address] Password [email or web address] 05/19/2017, 11:59 AM

## 2017-05-19 NOTE — Progress Notes (Signed)
Progress Note  Patient Name: Angela Hines Date of Encounter: 05/19/2017  Primary Cardiologist: No primary care provider on file.   Subjective   "I feel better this morning"  Inpatient Medications    Scheduled Meds: . atorvastatin  20 mg Oral QPM  . digoxin  0.25 mg Intravenous Daily  . doxycycline  100 mg Oral Q12H  . furosemide  40 mg Oral Daily  . gabapentin  600 mg Oral QHS  . insulin aspart  0-15 Units Subcutaneous TID WC  . ipratropium-albuterol  3 mL Nebulization TID  . LORazepam  1 mg Oral QHS  . losartan  100 mg Oral Daily  . metoprolol tartrate  37.5 mg Oral BID  . pantoprazole  40 mg Oral Daily  . potassium chloride  40 mEq Oral BID  . predniSONE  30 mg Oral Q breakfast  . Rivaroxaban  15 mg Oral Q supper  . sodium chloride flush  3 mL Intravenous Q12H   Continuous Infusions: . sodium chloride     PRN Meds: sodium chloride, albuterol, levalbuterol, LORazepam **AND** LORazepam, sodium chloride flush, zolpidem   Vital Signs    Vitals:   05/18/17 2005 05/19/17 0000 05/19/17 0530 05/19/17 0744  BP: 123/81 100/75 109/84 117/87  Pulse: (!) 112 (!) 120 89 82  Resp: (!) 24 (!) 25 (!) 21 20  Temp: 98 F (36.7 C) 98.2 F (36.8 C) 98.1 F (36.7 C) 97.6 F (36.4 C)  TempSrc: Oral Oral Oral Oral  SpO2: 96% 97% 97% 98%  Weight:      Height:        Intake/Output Summary (Last 24 hours) at 05/19/2017 0803 Last data filed at 05/19/2017 0000 Gross per 24 hour  Intake 713 ml  Output 1650 ml  Net -937 ml   Filed Weights   05/15/17 2041 05/16/17 0100  Weight: 110 lb (49.9 kg) 111 lb 12.4 oz (50.7 kg)    Telemetry    Atrial fib with a RVR and CVR - Personally Reviewed  ECG    none - Personally Reviewed  Physical Exam   GEN: No acute distress.   Neck: No JVD Cardiac: IRIRR, no murmurs, rubs, or gallops.  Respiratory: Clear to auscultation bilaterally. GI: Soft, nontender, non-distended  MS: No edema; No deformity. Neuro:  Nonfocal  Psych:  Normal affect   Labs    Chemistry Recent Labs  Lab 05/15/17 2035  05/16/17 0423 05/17/17 0214 05/18/17 0309 05/19/17 0337  NA 137   < > 136 139 138 136  K 4.6   < > 3.7 3.5 3.5 4.3  CL 105   < > 102 104 103 102  CO2 18*  --  20* 22 24 23   GLUCOSE 244*   < > 148* 171* 126* 130*  BUN 15   < > 15 21* 25* 21*  CREATININE 0.73   < > 0.59 0.69 0.56 0.69  CALCIUM 9.3  --  9.0 8.8* 8.9 9.1  PROT 7.9  --  6.7  --   --   --   ALBUMIN 3.9  --  3.2*  --   --   --   AST 31  --  23  --   --   --   ALT 25  --  22  --   --   --   ALKPHOS 108  --  87  --   --   --   BILITOT 0.5  --  0.8  --   --   --  GFRNONAA >60  --  >60 >60 >60 >60  GFRAA >60  --  >60 >60 >60 >60  ANIONGAP 14  --  14 13 11 11    < > = values in this interval not displayed.     Hematology Recent Labs  Lab 05/17/17 0214 05/18/17 0309 05/19/17 0337  WBC 17.2* 14.3* 12.4*  RBC 3.54* 3.40* 3.82*  HGB 11.0* 10.5* 11.8*  HCT 33.9* 32.7* 37.0  MCV 95.8 96.2 96.9  MCH 31.1 30.9 30.9  MCHC 32.4 32.1 31.9  RDW 14.6 14.7 14.6  PLT 309 294 314    Cardiac EnzymesNo results for input(s): TROPONINI in the last 168 hours.  Recent Labs  Lab 05/15/17 2053  TROPIPOC 0.02     BNP Recent Labs  Lab 05/15/17 2035  BNP 831.6*     DDimer No results for input(s): DDIMER in the last 168 hours.   Radiology    No results found.  Cardiac Studies   none  Patient Profile     84 y.o. female admitted with uncontrolled atrial fib and pneumonia, develop torsades after Levaquin. Sotalol stopped. She has had gradual uptitration of her rate control. She feels better today  Assessment & Plan    1. Atrial fib with a RVR - her rates are improved a bit. I will increase her dose of lopressor to 37.5 bid. She will receive IV digoxin again today. If her rates are better tomorrow, then she can be discharged home. If she goes too slow then back up PM support will be required which would be done on Monday. Hopefully this will not be  necessary.  2. Torsades - she has had no more arrhythmias. Will follow. 3. Respiratory failure - she is much improved. I suspect she had a component of CHF.  Gregg Taylor,M.D.  For questions or updates, please contact [place] Please consult [email or web address] for contact info under Cardiology/STEMI.      Signed, Cristopher Peru, MD  05/19/2017, 8:03 AM  Patient ID: Angela Hines, female   DOB: April 10, 1933, 84 y.o.   MRN: [id number]

## 2017-05-20 ENCOUNTER — Other Ambulatory Visit: Payer: Self-pay

## 2017-05-20 LAB — CULTURE, BLOOD (ROUTINE X 2)
CULTURE: NO GROWTH
CULTURE: NO GROWTH
SPECIAL REQUESTS: ADEQUATE
Special Requests: ADEQUATE

## 2017-05-20 LAB — GLUCOSE, CAPILLARY
GLUCOSE-CAPILLARY: 153 mg/dL — AB (ref 65–99)
Glucose-Capillary: 116 mg/dL — ABNORMAL HIGH (ref 65–99)
Glucose-Capillary: 226 mg/dL — ABNORMAL HIGH (ref 65–99)
Glucose-Capillary: 141 mg/dL — ABNORMAL HIGH (ref 65–99)

## 2017-05-20 MED ORDER — METOPROLOL TARTRATE 25 MG PO TABS
25.0000 mg | ORAL_TABLET | Freq: Once | ORAL | Status: AC
  Administered 2017-05-20: 25 mg via ORAL
  Filled 2017-05-20: qty 1

## 2017-05-20 MED ORDER — METOPROLOL TARTRATE 50 MG PO TABS
50.0000 mg | ORAL_TABLET | Freq: Two times a day (BID) | ORAL | Status: DC
  Administered 2017-05-20 – 2017-05-24 (×7): 50 mg via ORAL
  Filled 2017-05-20 (×8): qty 1

## 2017-05-20 MED ORDER — DIGOXIN 0.25 MG/ML IJ SOLN
0.2500 mg | Freq: Once | INTRAMUSCULAR | Status: AC
  Administered 2017-05-20: 0.25 mg via INTRAVENOUS
  Filled 2017-05-20: qty 1

## 2017-05-20 NOTE — Progress Notes (Signed)
PROGRESS NOTE   Angela Hines  CVE:[id number]    DOB: 02-Oct-1932    DOA: 05/15/2017  PCP: Kelton Pillar, MD   I have briefly reviewed patients previous medical records in [facility].  Brief Narrative:  84 year old female, PMH of chronic diastolic CHF, bioprosthetic AVR, COPD, HTN, DM, HLD, PAF scheduled for ablation 1/23, recent syncopal episode presented with worsening dyspnea and confusion.  She required CPAP via EMS and then BiPAP in ED.  Admitted for acute respiratory failure with hypoxia due to suspected pneumonia.,  COPD exacerbation and CHF.  Able to come off BiPAP.  Intermittent A. fib with RVR.  EP cardiology consulted 1/23.  Slowly improving.   Assessment & Plan:   Principal Problem:   Acute and chronic respiratory failure with hypoxia (HCC) Active Problems:   COPD with acute exacerbation (HCC)   Diabetes mellitus ([place])   Gastroesophageal reflux disease without esophagitis   Hypertension   AF (paroxysmal atrial fibrillation) (HCC)   Sepsis ([place])   Community acquired pneumonia   Acute respiratory failure with hypoxia (HCC)   Acute on chronic diastolic CHF (congestive heart failure) (HCC)   Prolonged QT interval   1. Acute respiratory failure with hypoxia: Possibly multifactorial related to COPD exacerbation, decompensated CHF and possible pneumonia.  Treat underlying cause.  Required CPAP >BiPAP on admission. Incentive spirometry and flutter valve.  Hypoxia resolved for several days now. 2. COPD exacerbation: Flutter valve.  Discontinued levofloxacin due to QT prolongation and transient torsades.  Oral doxycycline.  Changed IV Solu-Medrol to oral prednisone taper, reduced to 20 mg daily.  No clinical bronchospasm.  Stable. 3. Acute on chronic diastolic CHF: - 2.6  L since admission.  Changed IV Lasix 40 mg twice daily to 40 mg orally daily from 1/25.  2D echo 01/12/16: "Good LV pumping strength" and no EF documented.  Compensated. 4. Possible HCAP: Blood cultures  negative to date, MRSA PCR negative.  Leukocytosis has improved.  Discontinued vancomycin.  Discontinued levofloxacin due to prolonged QT and transient torsades.  Not fully convinced that this is pneumonia and all her presentation may be related to CHF/COPD. Started doxycycline.  Speech therapy recommends regular diet and thin liquids. Follow-up chest x-ray 1/24 showed interim clearing of basilar pulmonary infiltrate/edema.  Completed 6 days of antibiotics, discontinued 1/27. 5. Paroxysmal A. Fib: CHA2DS2Vasc is 5, on Xarelto 15mg  dose (reduced dose due to decreased creatinine clearance). She had a couple of hours of RVR early morning 1/23.  Started on Cardizem drip and converted to sinus rhythm. EP cardiology consulted, stat EKG showed marked QT prolongation, noted transient torsades on 1/23.  Cardiology discontinued sotalol, levofloxacin, Cardizem drip and metoprolol.  Avoiding amiodarone due to prolonged QT/torsades.  Difficult to control ventricular rate.  EP cardiology following and adjusting medications.  Heart rate better controlled during sleep but fast with activity.  Metoprolol increased to 50 mg twice daily.  This is her barrier for discharge at this time and cardiology is managing. 6. Confusion/toxic encephalopathy: Probably related to medications (Benadryl, Ambien and Ativan).  Resolved. 7. Type II DM: Hold metformin.  Continue SSI.  Mildly uncontrolled.  Should improve with taper of steroids.  Improving. 8. Possible sepsis versus SIRS: Present on admission.  Resolved. 9. GERD: PPI.  Has intermittent heartburn symptoms. 10. Anemia: Hemoglobin dropped from 12 on 1/18-10.9.  No overt bleeding.  Hemoglobin stable. 11. Status post bioprosthetic AVR: 12. Essential hypertension: Controlled. 13. Prolonged QTC: EKG 05/16/17 at 6:08 AM: 483 ms.  Discontinued and avoid precipitating  medications.  Keep magnesium >2 and potassium >4.  QTC 445 ms on 1/25.  Increased K Dur to 40 Meq bid. 14. Leukocytosis:  Likely related to steroid use, improving.     DVT prophylaxis: Xarelto Code Status: DNR Family Communication: None at bedside. Disposition: DC to ALF versus SNF pending clinical improvement.  Request PT evaluation to see if she needs any higher level of care at discharge.  She is from [place].  Consultants:  EP cardiology  Procedures:  BiPAP-discontinued  Antimicrobials:  Discontinued vancomycin and levofloxacin Oral doxycycline 1/23 >   Subjective: Expresses concern and worry that her heart rate is not being controlled despite several days of trial.  Reassured and encouraged her.  No dyspnea, chest pain or palpitations.  ROS: As above  Objective:  Vitals:   05/20/17 0055 05/20/17 0533 05/20/17 1006 05/20/17 1126  BP: 117/87 107/66 109/68 118/85  Pulse:   (!) 114   Resp: 16 (!) 21 (!) 21 (!) 23  Temp: 97.6 F (36.4 C) 97.6 F (36.4 C)  98.2 F (36.8 C)  TempSrc: Oral Oral  Oral  SpO2: 96% 96%  98%  Weight:      Height:        Examination:  General exam: Elderly female, small built and frail, lying comfortably propped up in bed without distress or oxygen. Respiratory system: Clear to auscultation.  No increased work of breathing.  Stable without change. Cardiovascular system: S1 and S2 heard, irregularly irregular and mildly tachycardic.  No JVD.  2/6 systolic ejection murmur at apex.  No pedal edema.  Telemetry: A. fib with reasonably controlled ventricular rate overnight likely while asleep but again RVR up to 130s this morning.  With Gastrointestinal system: Abdomen is nondistended, soft and nontender. No organomegaly or masses felt. Normal bowel sounds heard.  Stable without change. Central nervous system: Alert and oriented. No focal neurological deficits.  Stable without change. Extremities: Symmetric 5 x 5 power. Skin: No rashes, lesions or ulcers Psychiatry: Judgement and insight appear normal. Mood & affect appropriate.     Data Reviewed: I have  personally reviewed following labs and imaging studies  CBC: Recent Labs  Lab 05/15/17 2035 05/15/17 2055 05/16/17 0423 05/17/17 0214 05/18/17 0309 05/19/17 0337  WBC 20.9*  --  12.8* 17.2* 14.3* 12.4*  NEUTROABS 12.0*  --  11.1*  --   --   --   HGB 13.0 15.0 10.9* 11.0* 10.5* 11.8*  HCT 41.5 44.0 34.2* 33.9* 32.7* 37.0  MCV 100.5*  --  97.2 95.8 96.2 96.9  PLT 346  --  280 309 294 678   Basic Metabolic Panel: Recent Labs  Lab 05/15/17 2035 05/15/17 2055 05/16/17 0423 05/16/17 1441 05/17/17 0214 05/18/17 0309 05/19/17 0337  NA 137 138 136  --  139 138 136  K 4.6 4.7 3.7  --  3.5 3.5 4.3  CL 105 106 102  --  104 103 102  CO2 18*  --  20*  --  22 24 23   GLUCOSE 244* 250* 148*  --  171* 126* 130*  BUN 15 18 15   --  21* 25* 21*  CREATININE 0.73 0.50 0.59  --  0.69 0.56 0.69  CALCIUM 9.3  --  9.0  --  8.8* 8.9 9.1  MG  --   --   --  2.6* 2.1 1.9  --    Liver Function Tests: Recent Labs  Lab 05/15/17 2035 05/16/17 0423  AST 31 23  ALT 25 22  ALKPHOS 108 87  BILITOT 0.5 0.8  PROT 7.9 6.7  ALBUMIN 3.9 3.2*   CBG: Recent Labs  Lab 05/19/17 1132 05/19/17 1611 05/19/17 2152 05/20/17 0613 05/20/17 1124  GLUCAP 129* 238* 178* 153* 116*    Recent Results (from the past 240 hour(s))  Blood Culture (routine x 2)     Status: None (Preliminary result)   Collection Time: 05/15/17  8:35 PM  Result Value Ref Range Status   Specimen Description BLOOD RIGHT ANTECUBITAL  Final   Special Requests   Final    BOTTLES DRAWN AEROBIC AND ANAEROBIC Blood Culture adequate volume   Culture NO GROWTH 4 DAYS  Final   Report Status PENDING  Incomplete  Blood Culture (routine x 2)     Status: None (Preliminary result)   Collection Time: 05/15/17  9:06 PM  Result Value Ref Range Status   Specimen Description BLOOD LEFT ANTECUBITAL  Final   Special Requests   Final    BOTTLES DRAWN AEROBIC AND ANAEROBIC Blood Culture adequate volume   Culture NO GROWTH 4 DAYS  Final   Report  Status PENDING  Incomplete  Urine culture     Status: Abnormal   Collection Time: 05/15/17 10:58 PM  Result Value Ref Range Status   Specimen Description URINE, CATHETERIZED  Final   Special Requests NONE  Final   Culture <10,000 COLONIES/mL INSIGNIFICANT GROWTH (A)  Final   Report Status 05/17/2017 FINAL  Final  MRSA PCR Screening     Status: None   Collection Time: 05/16/17  1:57 AM  Result Value Ref Range Status   MRSA by PCR NEGATIVE NEGATIVE Final    Comment:        The GeneXpert MRSA Assay (FDA approved for NASAL specimens only), is one component of a comprehensive MRSA colonization surveillance program. It is not intended to diagnose MRSA infection nor to guide or monitor treatment for MRSA infections.          Radiology Studies: No results found.      Scheduled Meds: . atorvastatin  20 mg Oral QPM  . digoxin  0.25 mg Intravenous Once  . doxycycline  100 mg Oral Q12H  . furosemide  40 mg Oral Daily  . gabapentin  600 mg Oral QHS  . insulin aspart  0-15 Units Subcutaneous TID WC  . LORazepam  1 mg Oral QHS  . losartan  100 mg Oral Daily  . metoprolol tartrate  25 mg Oral Once  . metoprolol tartrate  50 mg Oral BID  . pantoprazole  40 mg Oral Daily  . potassium chloride  40 mEq Oral BID  . predniSONE  20 mg Oral Q breakfast  . Rivaroxaban  15 mg Oral Q supper  . sodium chloride flush  3 mL Intravenous Q12H   Continuous Infusions: . sodium chloride       LOS: 5 days     Vernell Leep, MD, FACP, [facility]. [facility] Pager [phone]  If 7PM-7AM, please contact night-coverage [email or web address] Password [email or web address] 05/20/2017, 11:55 AM

## 2017-05-20 NOTE — Progress Notes (Signed)
Progress Note  Patient Name: Chantille Navarrete Date of Encounter: 05/20/2017  Primary Cardiologist: No primary care provider on file.   Subjective   No chest pain or sob.   Inpatient Medications    Scheduled Meds: . atorvastatin  20 mg Oral QPM  . doxycycline  100 mg Oral Q12H  . furosemide  40 mg Oral Daily  . gabapentin  600 mg Oral QHS  . insulin aspart  0-15 Units Subcutaneous TID WC  . LORazepam  1 mg Oral QHS  . losartan  100 mg Oral Daily  . metoprolol tartrate  25 mg Oral Once  . metoprolol tartrate  50 mg Oral BID  . pantoprazole  40 mg Oral Daily  . potassium chloride  40 mEq Oral BID  . predniSONE  20 mg Oral Q breakfast  . Rivaroxaban  15 mg Oral Q supper  . sodium chloride flush  3 mL Intravenous Q12H   Continuous Infusions: . sodium chloride     PRN Meds: sodium chloride, albuterol, levalbuterol, LORazepam **AND** LORazepam, sodium chloride flush, zolpidem   Vital Signs    Vitals:   05/19/17 2120 05/20/17 0055 05/20/17 0533 05/20/17 1006  BP:  117/87 107/66 109/68  Pulse:    (!) 114  Resp:  16 (!) 21 (!) 21  Temp:  97.6 F (36.4 C) 97.6 F (36.4 C)   TempSrc:  Oral Oral   SpO2: 99% 96% 96%   Weight:      Height:        Intake/Output Summary (Last 24 hours) at 05/20/2017 1043 Last data filed at 05/20/2017 0500 Gross per 24 hour  Intake 810 ml  Output 450 ml  Net 360 ml   Filed Weights   05/15/17 2041 05/16/17 0100  Weight: 110 lb (49.9 kg) 111 lb 12.4 oz (50.7 kg)    Telemetry    Atrial fib with a RVR and CVR - Personally Reviewed  ECG    none - Personally Reviewed  Physical Exam   GEN: No acute distress.   Neck: 6 cm JVD Cardiac: IRIRR, no murmurs, rubs, or gallops.  Respiratory: Clear to auscultation bilaterally. GI: Soft, nontender, non-distended  MS: No edema; No deformity. Neuro:  Nonfocal  Psych: Normal affect   Labs    Chemistry Recent Labs  Lab 05/15/17 2035  05/16/17 0423 05/17/17 0214 05/18/17 0309  05/19/17 0337  NA 137   < > 136 139 138 136  K 4.6   < > 3.7 3.5 3.5 4.3  CL 105   < > 102 104 103 102  CO2 18*  --  20* 22 24 23   GLUCOSE 244*   < > 148* 171* 126* 130*  BUN 15   < > 15 21* 25* 21*  CREATININE 0.73   < > 0.59 0.69 0.56 0.69  CALCIUM 9.3  --  9.0 8.8* 8.9 9.1  PROT 7.9  --  6.7  --   --   --   ALBUMIN 3.9  --  3.2*  --   --   --   AST 31  --  23  --   --   --   ALT 25  --  22  --   --   --   ALKPHOS 108  --  87  --   --   --   BILITOT 0.5  --  0.8  --   --   --   GFRNONAA >60  --  >60 >60 >60 >60  GFRAA >60  --  >60 >60 >60 >60  ANIONGAP 14  --  14 13 11 11    < > = values in this interval not displayed.     Hematology Recent Labs  Lab 05/17/17 0214 05/18/17 0309 05/19/17 0337  WBC 17.2* 14.3* 12.4*  RBC 3.54* 3.40* 3.82*  HGB 11.0* 10.5* 11.8*  HCT 33.9* 32.7* 37.0  MCV 95.8 96.2 96.9  MCH 31.1 30.9 30.9  MCHC 32.4 32.1 31.9  RDW 14.6 14.7 14.6  PLT 309 294 314    Cardiac EnzymesNo results for input(s): TROPONINI in the last 168 hours.  Recent Labs  Lab 05/15/17 2053  TROPIPOC 0.02     BNP Recent Labs  Lab 05/15/17 2035  BNP 831.6*     DDimer No results for input(s): DDIMER in the last 168 hours.   Radiology    No results found.  Cardiac Studies     Patient Profile     84 y.o. female admitted with resp failure, likely multifactorial, who developed torsades in the setting of sotalol and levaquin, now difficult to rate control.   Assessment & Plan    1. Uncontrolled atrial fib - while sleeping she appears well controlled. However, she is still going fast with exertion. I have again uptitrated her beta blocker. I expect she will need back up rate support and will watch closely for bradycardia.  2. Torsades - her QT has recovered. No additional QT prolonging drugs.  3. Respiratory failure - she is comfortable at rest. Would suggest ambulation every shift.   Gregg Taylor,M.D.  For questions or updates, please contact [place] Please consult [email or web address] for contact info under Cardiology/STEMI.      Signed, Cristopher Peru, MD  05/20/2017, 10:43 AM  Patient ID: [name], female   DOB: 19-Aug-1932, 84 y.o.   MRN: [id number]

## 2017-05-21 ENCOUNTER — Other Ambulatory Visit: Payer: Self-pay

## 2017-05-21 DIAGNOSIS — I4891 Unspecified atrial fibrillation: Secondary | ICD-10-CM

## 2017-05-21 LAB — GLUCOSE, CAPILLARY
Glucose-Capillary: 136 mg/dL — ABNORMAL HIGH (ref 65–99)
Glucose-Capillary: 174 mg/dL — ABNORMAL HIGH (ref 65–99)
Glucose-Capillary: 200 mg/dL — ABNORMAL HIGH (ref 65–99)
Glucose-Capillary: 179 mg/dL — ABNORMAL HIGH (ref 65–99)

## 2017-05-21 MED ORDER — AMIODARONE HCL 200 MG PO TABS
200.0000 mg | ORAL_TABLET | Freq: Two times a day (BID) | ORAL | Status: DC
  Administered 2017-05-21 – 2017-05-24 (×7): 200 mg via ORAL
  Filled 2017-05-21 (×7): qty 1

## 2017-05-21 NOTE — Progress Notes (Signed)
Pt ambulated about 200 feet in hallway with standby assist. Pt's HR up to 138. Pt denied feeling SOB or weakness. Pt had steady gait. Pt tolerated very well. Returned to bed for dinner.     Grant Fontana BSN, RN

## 2017-05-21 NOTE — Progress Notes (Signed)
PROGRESS NOTE   Angela Hines  ELF:[id number]    DOB: Jun 05, 1932    DOA: 05/15/2017  PCP: Kelton Pillar, MD   I have briefly reviewed patients previous medical records in [facility].  Brief Narrative:  84 year old female, PMH of chronic diastolic CHF, bioprosthetic AVR, COPD, HTN, DM, HLD, PAF scheduled for ablation 1/23, recent syncopal episode presented with worsening dyspnea and confusion.  She required CPAP via EMS and then BiPAP in ED.  Admitted for acute respiratory failure with hypoxia due to suspected pneumonia.,  COPD exacerbation and CHF.  Able to come off BiPAP.  Slowly improving.  EP Cardiology adjusting medications for difficult to control A. fib with RVR >this is the current barrier to DC.   Assessment & Plan:   Principal Problem:   Acute and chronic respiratory failure with hypoxia (HCC) Active Problems:   COPD with acute exacerbation (HCC)   Diabetes mellitus ([place])   Gastroesophageal reflux disease without esophagitis   Hypertension   AF (paroxysmal atrial fibrillation) (HCC)   Sepsis ([place])   Community acquired pneumonia   Acute respiratory failure with hypoxia (HCC)   Acute on chronic diastolic CHF (congestive heart failure) (HCC)   Prolonged QT interval   1. Acute respiratory failure with hypoxia: Possibly multifactorial related to COPD exacerbation, decompensated CHF and possible pneumonia.  Treat underlying cause.  Required CPAP >BiPAP on admission. Incentive spirometry and flutter valve.  Hypoxia resolved for several days now. 2. COPD exacerbation: Flutter valve.  Discontinued levofloxacin due to QT prolongation and transient torsades.  Oral doxycycline, completed course.  Changed IV Solu-Medrol to oral prednisone taper, reduced to 20 mg daily.  No clinical bronchospasm.  Stable. 3. Acute on chronic diastolic CHF: - 2.6  L since admission.  Changed IV Lasix 40 mg twice daily to 40 mg orally daily from 1/25.  2D echo 01/12/16: "Good LV pumping strength" and  no EF documented.  Compensated. 4. Possible HCAP: Blood cultures negative to date, MRSA PCR negative.  Leukocytosis has improved.  Discontinued vancomycin.  Discontinued levofloxacin due to prolonged QT and transient torsades.  Not fully convinced that this is pneumonia and all her presentation may be related to CHF/COPD. Started doxycycline.  Speech therapy recommends regular diet and thin liquids. Follow-up chest x-ray 1/24 showed interim clearing of basilar pulmonary infiltrate/edema.  Completed course of antibiotics. 5. Paroxysmal A. Fib: CHA2DS2Vasc is 5, on Xarelto 15mg  dose (reduced dose due to decreased creatinine clearance). She had a couple of hours of RVR early morning 1/23.  Started on Cardizem drip and converted to sinus rhythm. EP cardiology consulted, stat EKG showed marked QT prolongation, noted transient torsades on 1/23.  Cardiology discontinued sotalol, levofloxacin, Cardizem drip and metoprolol.  Difficult to control ventricular rate.  EP Cardiology following and adjusting medications.  Metoprolol increased to 50 mg twice daily.  This is her barrier for discharge at this time and cardiology is managing.  Patient still does not have consistent rate control.  Controlled at rest but RVR up to 150s-160s with minimal activity.  EP Cardiology have started patient on Amiodarone 200 mg twice daily on 1/28. 6. Confusion/toxic encephalopathy: Probably related to medications (Benadryl, Ambien and Ativan).  Resolved. 7. Type II DM: Hold metformin.  Continue SSI.  Mildly uncontrolled.  Should improve with taper of steroids.  Improving. 8. Possible sepsis versus SIRS: Present on admission.  Resolved. 9. GERD: PPI.  Has intermittent heartburn symptoms. 10. Anemia: Hemoglobin dropped from 12 on 1/18-10.9.  No overt bleeding.  Hemoglobin  stable. 11. Status post bioprosthetic AVR: 12. Essential hypertension: Controlled. 13. Prolonged QTC: EKG 05/16/17 at 6:08 AM: 483 ms.  Discontinued and avoid  precipitating medications.  Keep magnesium >2 and potassium >4.  QTC 413 ms on 1/28.  Increased K Dur to 40 Meq bid.  Follow BMP in a.m. 14. Leukocytosis: Likely related to steroid use, improving.     DVT prophylaxis: Xarelto Code Status: DNR Family Communication: None at bedside. Disposition: DC to ALF versus SNF pending clinical improvement.  Requested PT evaluation to see if she needs any higher level of care at discharge.  She is from [place].  Discussed with RN, ambulate and PT to see.  Consultants:  EP cardiology  Procedures:  BiPAP-discontinued  Antimicrobials:  Discontinued vancomycin and levofloxacin Oral doxycycline 1/23 >   Subjective: Denies complaints.  No palpitations, chest pain or dyspnea.  Intermittent minimal dry cough.  ROS: As above  Objective:  Vitals:   05/20/17 1929 05/20/17 2355 05/21/17 0447 05/21/17 0935  BP: 109/71 (!) 89/57 106/61 104/67  Pulse: (!) 108 (!) 134  (!) 122  Resp: (!) 22 (!) 22 18 (!) 27  Temp: 97.9 F (36.6 C) 97.6 F (36.4 C) 97.6 F (36.4 C) 98.3 F (36.8 C)  TempSrc: Oral Oral Oral Axillary  SpO2: 98% 100% 98% 99%  Weight:      Height:        Examination:  General exam: Elderly female, small built and frail, lying comfortably propped up in bed without distress or oxygen. Respiratory system: Clear to auscultation.  No increased work of breathing.  Stable without change. Cardiovascular system: S1 and S2 heard, irregularly irregular.  No JVD.  2/6 systolic ejection murmur at apex.  No pedal edema.  Telemetry personally reviewed: Rate controlled at rest in the 80s-90s but minimal activity seems to have RVR even up to 150s-160s this morning. Gastrointestinal system: Abdomen is nondistended, soft and nontender. No organomegaly or masses felt. Normal bowel sounds heard.  Stable without change. Central nervous system: Alert and oriented. No focal neurological deficits.  Stable without change. Extremities: Symmetric 5 x 5  power. Skin: No rashes, lesions or ulcers Psychiatry: Judgement and insight appear normal. Mood & affect appropriate.     Data Reviewed: I have personally reviewed following labs and imaging studies  CBC: Recent Labs  Lab 05/15/17 2035 05/15/17 2055 05/16/17 0423 05/17/17 0214 05/18/17 0309 05/19/17 0337  WBC 20.9*  --  12.8* 17.2* 14.3* 12.4*  NEUTROABS 12.0*  --  11.1*  --   --   --   HGB 13.0 15.0 10.9* 11.0* 10.5* 11.8*  HCT 41.5 44.0 34.2* 33.9* 32.7* 37.0  MCV 100.5*  --  97.2 95.8 96.2 96.9  PLT 346  --  280 309 294 767   Basic Metabolic Panel: Recent Labs  Lab 05/15/17 2035 05/15/17 2055 05/16/17 0423 05/16/17 1441 05/17/17 0214 05/18/17 0309 05/19/17 0337  NA 137 138 136  --  139 138 136  K 4.6 4.7 3.7  --  3.5 3.5 4.3  CL 105 106 102  --  104 103 102  CO2 18*  --  20*  --  22 24 23   GLUCOSE 244* 250* 148*  --  171* 126* 130*  BUN 15 18 15   --  21* 25* 21*  CREATININE 0.73 0.50 0.59  --  0.69 0.56 0.69  CALCIUM 9.3  --  9.0  --  8.8* 8.9 9.1  MG  --   --   --  2.6* 2.1 1.9  --    Liver Function Tests: Recent Labs  Lab 05/15/17 2035 05/16/17 0423  AST 31 23  ALT 25 22  ALKPHOS 108 87  BILITOT 0.5 0.8  PROT 7.9 6.7  ALBUMIN 3.9 3.2*   CBG: Recent Labs  Lab 05/20/17 0613 05/20/17 1124 05/20/17 1627 05/20/17 2101 05/21/17 0600  GLUCAP 153* 116* 226* 141* 136*    Recent Results (from the past 240 hour(s))  Blood Culture (routine x 2)     Status: None   Collection Time: 05/15/17  8:35 PM  Result Value Ref Range Status   Specimen Description BLOOD RIGHT ANTECUBITAL  Final   Special Requests   Final    BOTTLES DRAWN AEROBIC AND ANAEROBIC Blood Culture adequate volume   Culture NO GROWTH 5 DAYS  Final   Report Status 05/20/2017 FINAL  Final  Blood Culture (routine x 2)     Status: None   Collection Time: 05/15/17  9:06 PM  Result Value Ref Range Status   Specimen Description BLOOD LEFT ANTECUBITAL  Final   Special Requests   Final     BOTTLES DRAWN AEROBIC AND ANAEROBIC Blood Culture adequate volume   Culture NO GROWTH 5 DAYS  Final   Report Status 05/20/2017 FINAL  Final  Urine culture     Status: Abnormal   Collection Time: 05/15/17 10:58 PM  Result Value Ref Range Status   Specimen Description URINE, CATHETERIZED  Final   Special Requests NONE  Final   Culture <10,000 COLONIES/mL INSIGNIFICANT GROWTH (A)  Final   Report Status 05/17/2017 FINAL  Final  MRSA PCR Screening     Status: None   Collection Time: 05/16/17  1:57 AM  Result Value Ref Range Status   MRSA by PCR NEGATIVE NEGATIVE Final    Comment:        The GeneXpert MRSA Assay (FDA approved for NASAL specimens only), is one component of a comprehensive MRSA colonization surveillance program. It is not intended to diagnose MRSA infection nor to guide or monitor treatment for MRSA infections.          Radiology Studies: No results found.      Scheduled Meds: . amiodarone  200 mg Oral BID  . atorvastatin  20 mg Oral QPM  . furosemide  40 mg Oral Daily  . gabapentin  600 mg Oral QHS  . insulin aspart  0-15 Units Subcutaneous TID WC  . LORazepam  1 mg Oral QHS  . losartan  100 mg Oral Daily  . metoprolol tartrate  50 mg Oral BID  . pantoprazole  40 mg Oral Daily  . potassium chloride  40 mEq Oral BID  . predniSONE  20 mg Oral Q breakfast  . Rivaroxaban  15 mg Oral Q supper  . sodium chloride flush  3 mL Intravenous Q12H   Continuous Infusions: . sodium chloride       LOS: 6 days     Vernell Leep, MD, FACP, [facility]. [facility] Pager [phone]  If 7PM-7AM, please contact night-coverage [email or web address] Password [facility] 05/21/2017, 11:16 AM

## 2017-05-21 NOTE — Evaluation (Signed)
Physical Therapy Evaluation Patient Details Name: Ariadne Rissmiller MRN: [id number] DOB: 12-14-1932 Today's Date: 05/21/2017   History of Present Illness  84 yo female with onset of acute respiratory failure with hypoxia and PNA was admitted and noted a-fib with RVR.  Very uncontrolled this AM. Has sepsis vs SIRS per chart.  Definite confusion from toxic encephalopathy. PMHx:  CHF, bioprosthetic AVR, COPD, HTN, DM, PAF, ablation planned,84 yo female with onset of acute respiratory failure with hypoxia and PNA was admitted and noted a-fib with RVR.  Very uncontrolled this AM. Has sepsis vs SIRS per chart.  Definite confusion from toxic encephalopathy. PMHx:  CHF, bioprosthetic AVR, COPD, HTN, DM, PAF, ablation planned,  Clinical Impression  Pt is up to walk with signficant increase from 120 baseline to 166 HR, but is also fairly stable walking.  Due to her instability medically is recommended to SNF and could reconsider back to [place] if her HR became predictable.  Otherwise will not be safe medically or physically to go there.  Pt is going to continue acutely for gait training and strengthening with monitoring of vitals.    Follow Up Recommendations SNF    Equipment Recommendations  Rolling walker with 5" wheels    Recommendations for Other Services       Precautions / Restrictions Precautions Precautions: Fall(telemetry) Precaution Comments: watch HR Restrictions Weight Bearing Restrictions: No      Mobility  Bed Mobility Overal bed mobility: Needs Assistance Bed Mobility: Supine to Sit;Sit to Supine     Supine to sit: Min assist Sit to supine: Min assist   General bed mobility comments: minor help for lifting trunk out and legs in  Transfers Overall transfer level: Needs assistance Equipment used: Rolling walker (2 wheeled);1 person hand held assist Transfers: Sit to/from Stand Sit to Stand: Min guard;Min assist         General transfer comment: cued hand  placement  Ambulation/Gait Ambulation/Gait assistance: Min guard;Min assist Ambulation Distance (Feet): 30 Feet Assistive device: Rolling walker (2 wheeled);1 person hand held assist Gait Pattern/deviations: Step-through pattern;Decreased stride length;Shuffle;Narrow base of support Gait velocity: reduced Gait velocity interpretation: Below normal speed for age/gender General Gait Details: pulses were uncontrolled for gait from 123 baseline to 166 with minor activity  Stairs            Wheelchair Mobility    Modified Rankin (Stroke Patients Only)       Balance Overall balance assessment: Needs assistance;History of Falls Sitting-balance support: Feet supported Sitting balance-Leahy Scale: Fair     Standing balance support: Bilateral upper extremity supported;During functional activity Standing balance-Leahy Scale: Fair                               Pertinent Vitals/Pain Pain Assessment: No/denies pain    Home Living Family/patient expects to be discharged to:: Skilled nursing facility                      Prior Function Level of [place]: Needs assistance   Gait / Transfers Assistance Needed: used no AD but lives in [place] at [place] [facility] / Homemaking Assistance Needed: family and staff to help with cleaning        Hand Dominance   Dominant Hand: Right    Extremity/Trunk Assessment   Upper Extremity Assessment Upper Extremity Assessment: Overall WFL for tasks assessed    Lower Extremity Assessment Lower Extremity Assessment: Generalized weakness  Cervical / Trunk Assessment Cervical / Trunk Assessment: Lordotic  Communication   Communication: No difficulties  Cognition Arousal/Alertness: Awake/alert Behavior During Therapy: Impulsive Overall Cognitive Status: Impaired/Different from baseline Area of Impairment: Memory;Following commands;Safety/judgement;Awareness;Problem solving;Attention                    Current Attention Level: Selective Memory: Decreased recall of precautions;Decreased short-term memory Following Commands: Follows one step commands with increased time Safety/Judgement: Decreased awareness of safety;Decreased awareness of deficits Awareness: Intellectual Problem Solving: Slow processing;Requires verbal cues        General Comments      Exercises     Assessment/Plan    PT Assessment Patient needs continued PT services  PT Problem List Decreased strength;Decreased range of motion;Decreased activity tolerance;Decreased balance;Decreased mobility;Decreased coordination;Decreased cognition;Decreased knowledge of use of DME;Decreased safety awareness;Cardiopulmonary status limiting activity       PT Treatment Interventions DME instruction;Gait training;Functional mobility training;Therapeutic activities;Therapeutic exercise;Balance training;Neuromuscular re-education;Patient/family education    PT Goals (Current goals can be found in the Care Plan section)  Acute Rehab PT Goals Patient Stated Goal: to be able to get home with safe transition PT Goal Formulation: With patient Time For Goal Achievement: 06/04/17 Potential to Achieve Goals: Good    Frequency Min 2X/week   Barriers to discharge Decreased caregiver support(has no onsite assistance for most activity) needs to be medically supervised for high HR    Co-evaluation               AM-PAC PT "6 Clicks" Daily Activity  Outcome Measure Difficulty turning over in bed (including adjusting bedclothes, sheets and blankets)?: None Difficulty moving from lying on back to sitting on the side of the bed? : A Little Difficulty sitting down on and standing up from a chair with arms (e.g., wheelchair, bedside commode, etc,.)?: A Little Help needed moving to and from a bed to chair (including a wheelchair)?: A Little Help needed walking in hospital room?: A Little Help needed climbing 3-5 steps with a railing?  : A Little 6 Click Score: 19    End of Session Equipment Utilized During Treatment: Gait belt Activity Tolerance: Patient tolerated treatment well;Treatment limited secondary to medical complications (Comment) Patient left: in bed;with call bell/phone within reach;with bed alarm set Nurse Communication: Mobility status PT Visit Diagnosis: Unsteadiness on feet (R26.81);Other abnormalities of gait and mobility (R26.89);Muscle weakness (generalized) (M62.81);Difficulty in walking, not elsewhere classified (R26.2);Adult, failure to thrive (R62.7)    Time: 9622-2979 PT Time Calculation (min) (ACUTE ONLY): 17 min   Charges:   PT Evaluation $PT Eval Moderate Complexity: 1 Mod     PT G Codes:   PT G-Codes **NOT FOR INPATIENT CLASS** Functional Assessment Tool Used: AM-PAC 6 Clicks Basic Mobility    Ramond Dial 05/21/2017, 12:19 PM   Mee Hives, PT MS Acute Rehab Dept. Number: [place]

## 2017-05-21 NOTE — Progress Notes (Addendum)
Electrophysiology Rounding Note  Patient Name: Angela Hines Date of Encounter: 05/21/2017  Primary Cardiologist: Johnsie Cancel Electrophysiologist: Jermone Geister   Subjective   The patient is doing well today.  At this time, the patient denies chest pain, shortness of breath. She states she is fatigued.  She has walked in her room without shortness of breath, but not further than that. She lives alone in independent living at [facility].  She would like to go home.   Inpatient Medications    Scheduled Meds: . atorvastatin  20 mg Oral QPM  . furosemide  40 mg Oral Daily  . gabapentin  600 mg Oral QHS  . insulin aspart  0-15 Units Subcutaneous TID WC  . LORazepam  1 mg Oral QHS  . losartan  100 mg Oral Daily  . metoprolol tartrate  50 mg Oral BID  . pantoprazole  40 mg Oral Daily  . potassium chloride  40 mEq Oral BID  . predniSONE  20 mg Oral Q breakfast  . Rivaroxaban  15 mg Oral Q supper  . sodium chloride flush  3 mL Intravenous Q12H   Continuous Infusions: . sodium chloride     PRN Meds: sodium chloride, albuterol, levalbuterol, LORazepam **AND** LORazepam, sodium chloride flush, zolpidem   Vital Signs    Vitals:   05/20/17 1628 05/20/17 1929 05/20/17 2355 05/21/17 0447  BP: 112/81 109/71 (!) 89/57 106/61  Pulse: (!) 46 (!) 108 (!) 134   Resp: (!) 26 (!) 22 (!) 22 18  Temp: 98.2 F (36.8 C) 97.9 F (36.6 C) 97.6 F (36.4 C) 97.6 F (36.4 C)  TempSrc: Oral Oral Oral Oral  SpO2: 97% 98% 100% 98%  Weight:      Height:        Intake/Output Summary (Last 24 hours) at 05/21/2017 0735 Last data filed at 05/20/2017 2300 Gross per 24 hour  Intake 630 ml  Output 350 ml  Net 280 ml   Filed Weights   05/15/17 2041 05/16/17 0100  Weight: 110 lb (49.9 kg) 111 lb 12.4 oz (50.7 kg)    Physical Exam    GEN- The patient is elderly and frail appearing, alert and oriented x 3 today.   Head- normocephalic, atraumatic Eyes-  Sclera clear, conjunctiva pink Ears- hearing  intact Oropharynx- clear Neck- supple Lungs- Clear to ausculation bilaterally, normal work of breathing Heart- Irregular rate and rhythm  GI- soft, NT, ND, + BS Extremities- no clubbing, cyanosis, or edema Skin- no rash or lesion Psych- euthymic mood, full affect Neuro- strength and sensation are intact  Labs    CBC Recent Labs    05/19/17 0337  WBC 12.4*  HGB 11.8*  HCT 37.0  MCV 96.9  PLT 825   Basic Metabolic Panel Recent Labs    05/19/17 0337  NA 136  K 4.3  CL 102  CO2 23  GLUCOSE 130*  BUN 21*  CREATININE 0.69  CALCIUM 9.1    Telemetry    AF, V rates 70-120's (personally reviewed)  Radiology    No results found.   Assessment & Plan    1.  Persistent atrial fibrillation with RVR BP limits uptitration of meds She is relatively asymptomatic at rest Dr Rayann Heman had planned for low dose amiodarone last week but this was not started Dr Rayann Heman discussed with patient at length today - will plan low dose amio and follow closely.   Continue P[place]it for CHADS2VASC of 5  2.  Prolonged QT/torsades Occurred in the setting of sotalol  and levaquin Resolved Avoid QT prolonging drugs  3.  Shortness of breath Improved  Will ask PT to see to evaluate whether she needs short term rehab at [facility]. Dr Rayann Heman to see later today    Signed, Chanetta Marshall, NP  05/21/2017, 7:35 AM    I have seen, examined the patient, and reviewed the above assessment and plan.  Changes to above are made where necessary.  On exam, tachycardic irregular rhythm.  We discussed her very complex afib at length today.  Options of rate control, AV nodal ablation, and Amiodarone were discussed at length.  Risks and benefits of each approach were discussed.  At this time, she would like to start amiodarone. I will therefore start amiodarone 200mg  BID at this time. Continue to titrate beta blocker therapy as able. Hopefully to discharge soon with close follow-up in the AF clinic.  She is  appropriately anticoagulated.  QT has returned to baseline.   Co Sign: [name], MD

## 2017-05-22 ENCOUNTER — Other Ambulatory Visit: Payer: Self-pay

## 2017-05-22 LAB — MAGNESIUM: Magnesium: 1.9 mg/dL (ref 1.7–2.4)

## 2017-05-22 LAB — BASIC METABOLIC PANEL
Anion gap: 11 (ref 5–15)
BUN: 27 mg/dL — AB (ref 6–20)
CALCIUM: 9.2 mg/dL (ref 8.9–10.3)
CO2: 24 mmol/L (ref 22–32)
Chloride: 101 mmol/L (ref 101–111)
Creatinine, Ser: 0.72 mg/dL (ref 0.44–1.00)
GFR calc Af Amer: >60 mL/min (ref >60)
GLUCOSE: 124 mg/dL — AB (ref 65–99)
Potassium: 4.6 mmol/L (ref 3.5–5.1)
Sodium: 136 mmol/L (ref 135–145)

## 2017-05-22 LAB — CBC WITH DIFFERENTIAL/PLATELET
BASOS ABS: 0 10*3/uL (ref 0.0–0.1)
BASOS PCT: 0 %
Eosinophils Absolute: 0.1 10*3/uL (ref 0.0–0.7)
Eosinophils Relative: 1 %
HCT: 37.2 % (ref 36.0–46.0)
Hemoglobin: 12 g/dL (ref 12.0–15.0)
LYMPHS PCT: 39 %
Lymphs Abs: 5.2 10*3/uL — ABNORMAL HIGH (ref 0.7–4.0)
MCH: 31.1 pg (ref 26.0–34.0)
MCHC: 32.3 g/dL (ref 30.0–36.0)
MCV: 96.4 fL (ref 78.0–100.0)
MONO ABS: 0.9 10*3/uL (ref 0.1–1.0)
MONOS PCT: 6 %
Neutro Abs: 7.3 10*3/uL (ref 1.7–7.7)
Neutrophils Relative %: 54 %
PLATELETS: 297 10*3/uL (ref 150–400)
RBC: 3.86 MIL/uL — ABNORMAL LOW (ref 3.87–5.11)
RDW: 14.8 % (ref 11.5–15.5)
WBC: 13.5 10*3/uL — ABNORMAL HIGH (ref 4.0–10.5)

## 2017-05-22 LAB — GLUCOSE, CAPILLARY
GLUCOSE-CAPILLARY: 118 mg/dL — AB (ref 65–99)
GLUCOSE-CAPILLARY: 185 mg/dL — AB (ref 65–99)
Glucose-Capillary: 138 mg/dL — ABNORMAL HIGH (ref 65–99)
Glucose-Capillary: 158 mg/dL — ABNORMAL HIGH (ref 65–99)

## 2017-05-22 LAB — TSH: TSH: 0.659 u[IU]/mL (ref 0.350–4.500)

## 2017-05-22 NOTE — Progress Notes (Signed)
Pt doing "ok" today Still with RVR Will continue po amio load If stable tomorrow, can likely discharge to SNF (PT recommendation) with close outpatient follow up  Dr Rayann Heman to see in the morning  Chanetta Marshall, NP 05/22/2017 2:57 PM  [name] MD, [facility] 05/22/2017 3:46 PM

## 2017-05-22 NOTE — Progress Notes (Signed)
PROGRESS NOTE   Angela Hines  ZHY:[id number]    DOB: 10-19-1932    DOA: 05/15/2017  PCP: Kelton Pillar, MD   I have briefly reviewed patients previous medical records in [facility].  Brief Narrative:  34 F, PMH of chronic diastolic CHF, bioprosthetic AVR 2005, COPD, HTN, DM, HLD, PAF s/p TEE/DVCCV 07/2014 + loop for syncope --scheduled for ablation 1/23, recent syncopal episode presented with worsening dyspnea and confusion.  She required CPAP via EMS and then BiPAP in ED.  Admitted for acute respiratory failure with hypoxia due to suspected pneumonia.,  COPD exacerbation and CHF.  Able to come off BiPAP.  Slowly improving.  EP Cardiology adjusting medications for difficult to control A. fib with RVR >this is the current barrier to DC.   Assessment & Plan:   Principal Problem:   Acute and chronic respiratory failure with hypoxia (HCC) Active Problems:   COPD with acute exacerbation (HCC)   Diabetes mellitus ([place])   Gastroesophageal reflux disease without esophagitis   Hypertension   AF (paroxysmal atrial fibrillation) (HCC)   Sepsis ([place])   Community acquired pneumonia   Acute respiratory failure with hypoxia (HCC)   Acute on chronic diastolic CHF (congestive heart failure) (HCC)   Prolonged QT interval   Acute respiratory failure with hypoxia: Possibly multifactorial related to COPD exacerbation, decompensated CHF and possible pneumonia.  Treat underlying cause.  Required CPAP >BiPAP on admission. Incentive spirometry and flutter valve.  Hypoxia resolved for several days now. COPD exacerbation: Flutter valve.  Discontinued levofloxacin due to QT prolongation and transient torsades.  Oral doxycycline, completed course.  Changed IV Solu-Medrol to oral prednisone taper, reduced to 20 mg daily.  No clinical bronchospasm.  Stable. Acute on chronic diastolic CHF:-weight at cardiology office visit 10/29018 104, - 2.7  L since admission.  Changed IV Lasix 40 mg twice daily to 40 mg  orally daily from 1/25.  2D echo 01/12/16: "Good LV pumping strength" and no EF documented.  Compensated. Possible HCAP: Blood cultures negative to date, MRSA PCR negative.  Leukocytosis has improved.  Discontinued vancomycin.  Discontinued levofloxacin due to prolonged QT and transient torsades.  ? all related to CHF/COPD. Started doxycycline.  Speech therapy recommends regular diet and thin liquids. Follow-up chest x-ray 1/24 showed interim clearing of basilar pulmonary infiltrate/edema.  Completed course of antibiotics. Paroxysmal A. Fib: CHA2DS2Vasc is 5, on Xarelto 15mg  dose (reduced dose due to decreased creatinine clearance). She had a couple of hours of RVR early morning 1/23.  Started on Cardizem drip and converted to sinus rhythm. EP cardiology consulted, stat EKG showed marked QT prolongation, noted transient torsades on 1/23.  Cardiology discontinued sotalol, levofloxacin, Cardizem drip and metoprolol. EP Cardiology following and adjusting medications.  Metoprolol increased to 50 mg twice daily.  This is her barrier for discharge at this time.  Controlled at rest but RVR up to 150s-160s with minimal activity.  EP Cardiology have started patient on Amiodarone 200 mg twice daily on 1/28. Confusion/toxic encephalopathy: Probably related to medications (Benadryl, Ambien and Ativan).  Resolved. Type II DM with neuropathy: Hold metformin.  Continue SSI.  sugrs 118-124, cont gabapentin 600 hs Possible sepsis versus SIRS: Present on admission.  Resolved. GERD: PPI.  Has intermittent heartburn symptoms.  Adding TUMS 500 tid 1/29 Anemia: Hemoglobin dropped from 12 on 1/18-10.9.  No overt bleeding.  Hemoglobin stable. Status post bioprosthetic AVR: Essential hypertension: holding losartan 1/29 Prolonged QTC: EKG 05/16/17 at 6:08 AM: 483 ms.  Discontinued and avoid precipitating  medications.  Keep magnesium >2 and potassium >4.  QTC 413 ms on 1/28.  Increased K Dur to 40 Meq bid.  Follow BMP in  a.m. Leukocytosis: Likely related to steroid use, improving.     DVT prophylaxis: Xarelto Code Status: DNR Family Communication: None at bedside. Disposition: DC to ALF versus SNF pending clinical improvement.  Requested PT evaluation to see if she needs any higher level of care at discharge.  She is from [place].  Discussed with RN, ambulate and PT to see.  Consultants:  EP cardiology  Procedures:  BiPAP-discontinued  Antimicrobials:  Discontinued vancomycin and levofloxacin Oral doxycycline 1/23 >   Subjective:  Awake alert pleasant Nursing informs bp 9-0 range systolic and held Losartan and am dose Metoprolol Patient also felt funny this am-some discomofrt in abd and mentally a little more "sluggish" However orients well and not confused  Objective:  Vitals:   05/21/17 2347 05/22/17 0332 05/22/17 0816 05/22/17 0820  BP: (!) 100/57 91/69  92/60  Pulse:    (!) 105  Resp: 20 20    Temp: 97.9 F (36.6 C) 97.6 F (36.4 C) 98.1 F (36.7 C)   TempSrc: Oral Oral Oral   SpO2: 99% 98%    Weight:      Height:        Examination:  Frail pleasant Awake alert pleasant in nadno pallor no ict, PERRLA No jvd,  Tachy 120's-no discernible m cta b No tenderness no gaurding, no rebound No le edema Power 5/5/, reflexes 2/3-rest wnl    Data Reviewed: I have personally reviewed following labs and imaging studies  CBC: Recent Labs  Lab 05/15/17 2035  05/16/17 0423 05/17/17 0214 05/18/17 0309 05/19/17 0337 05/22/17 0321  WBC 20.9*  --  12.8* 17.2* 14.3* 12.4* 13.5*  NEUTROABS 12.0*  --  11.1*  --   --   --  7.3  HGB 13.0   < > 10.9* 11.0* 10.5* 11.8* 12.0  HCT 41.5   < > 34.2* 33.9* 32.7* 37.0 37.2  MCV 100.5*  --  97.2 95.8 96.2 96.9 96.4  PLT 346  --  280 309 294 314 297   < > = values in this interval not displayed.   Basic Metabolic Panel: Recent Labs  Lab 05/16/17 0423 05/16/17 1441 05/17/17 0214 05/18/17 0309 05/19/17 0337 05/22/17 0321  NA  136  --  139 138 136 136  K 3.7  --  3.5 3.5 4.3 4.6  CL 102  --  104 103 102 101  CO2 20*  --  22 24 23 24   GLUCOSE 148*  --  171* 126* 130* 124*  BUN 15  --  21* 25* 21* 27*  CREATININE 0.59  --  0.69 0.56 0.69 0.72  CALCIUM 9.0  --  8.8* 8.9 9.1 9.2  MG  --  2.6* 2.1 1.9  --  1.9   Liver Function Tests: Recent Labs  Lab 05/15/17 2035 05/16/17 0423  AST 31 23  ALT 25 22  ALKPHOS 108 87  BILITOT 0.5 0.8  PROT 7.9 6.7  ALBUMIN 3.9 3.2*   CBG: Recent Labs  Lab 05/21/17 0600 05/21/17 1148 05/21/17 1639 05/21/17 2138 05/22/17 0602  GLUCAP 136* 174* 200* 179* 118*    Recent Results (from the past 240 hour(s))  Blood Culture (routine x 2)     Status: None   Collection Time: 05/15/17  8:35 PM  Result Value Ref Range Status   Specimen Description BLOOD RIGHT ANTECUBITAL  Final   Special Requests   Final    BOTTLES DRAWN AEROBIC AND ANAEROBIC Blood Culture adequate volume   Culture NO GROWTH 5 DAYS  Final   Report Status 05/20/2017 FINAL  Final  Blood Culture (routine x 2)     Status: None   Collection Time: 05/15/17  9:06 PM  Result Value Ref Range Status   Specimen Description BLOOD LEFT ANTECUBITAL  Final   Special Requests   Final    BOTTLES DRAWN AEROBIC AND ANAEROBIC Blood Culture adequate volume   Culture NO GROWTH 5 DAYS  Final   Report Status 05/20/2017 FINAL  Final  Urine culture     Status: Abnormal   Collection Time: 05/15/17 10:58 PM  Result Value Ref Range Status   Specimen Description URINE, CATHETERIZED  Final   Special Requests NONE  Final   Culture <10,000 COLONIES/mL INSIGNIFICANT GROWTH (A)  Final   Report Status 05/17/2017 FINAL  Final  MRSA PCR Screening     Status: None   Collection Time: 05/16/17  1:57 AM  Result Value Ref Range Status   MRSA by PCR NEGATIVE NEGATIVE Final    Comment:        The GeneXpert MRSA Assay (FDA approved for NASAL specimens only), is one component of a comprehensive MRSA colonization surveillance program. It  is not intended to diagnose MRSA infection nor to guide or monitor treatment for MRSA infections.          Radiology Studies: No results found.      Scheduled Meds: . amiodarone  200 mg Oral BID  . atorvastatin  20 mg Oral QPM  . furosemide  40 mg Oral Daily  . gabapentin  600 mg Oral QHS  . insulin aspart  0-15 Units Subcutaneous TID WC  . LORazepam  1 mg Oral QHS  . losartan  100 mg Oral Daily  . metoprolol tartrate  50 mg Oral BID  . pantoprazole  40 mg Oral Daily  . potassium chloride  40 mEq Oral BID  . predniSONE  20 mg Oral Q breakfast  . Rivaroxaban  15 mg Oral Q supper  . sodium chloride flush  3 mL Intravenous Q12H   Continuous Infusions: . sodium chloride       LOS: 7 days    Verneita Griffes, MD [facility] ([phone]   If 7PM-7AM, please contact night-coverage [email or web address] Password [email or web address] 05/22/2017, 8:25 AM

## 2017-05-23 ENCOUNTER — Ambulatory Visit (HOSPITAL_COMMUNITY): Payer: Medicare Other | Admitting: Nurse Practitioner

## 2017-05-23 ENCOUNTER — Other Ambulatory Visit: Payer: Self-pay

## 2017-05-23 LAB — GLUCOSE, CAPILLARY
GLUCOSE-CAPILLARY: 105 mg/dL — AB (ref 65–99)
GLUCOSE-CAPILLARY: 145 mg/dL — AB (ref 65–99)
Glucose-Capillary: 204 mg/dL — ABNORMAL HIGH (ref 65–99)
Glucose-Capillary: 106 mg/dL — ABNORMAL HIGH (ref 65–99)

## 2017-05-23 LAB — COMPREHENSIVE METABOLIC PANEL
ALK PHOS: 75 U/L (ref 38–126)
ALT: 23 U/L (ref 14–54)
ANION GAP: 11 (ref 5–15)
AST: 22 U/L (ref 15–41)
Albumin: 3 g/dL — ABNORMAL LOW (ref 3.5–5.0)
BILIRUBIN TOTAL: 0.4 mg/dL (ref 0.3–1.2)
BUN: 25 mg/dL — ABNORMAL HIGH (ref 6–20)
CALCIUM: 8.9 mg/dL (ref 8.9–10.3)
CO2: 22 mmol/L (ref 22–32)
Chloride: 100 mmol/L — ABNORMAL LOW (ref 101–111)
Creatinine, Ser: 0.74 mg/dL (ref 0.44–1.00)
Glucose, Bld: 214 mg/dL — ABNORMAL HIGH (ref 65–99)
Potassium: 4.6 mmol/L (ref 3.5–5.1)
SODIUM: 133 mmol/L — AB (ref 135–145)
Total Protein: 5.8 g/dL — ABNORMAL LOW (ref 6.5–8.1)

## 2017-05-23 LAB — MAGNESIUM: MAGNESIUM: 1.8 mg/dL (ref 1.7–2.4)

## 2017-05-23 MED ORDER — MAGNESIUM OXIDE 400 (241.3 MG) MG PO TABS
400.0000 mg | ORAL_TABLET | Freq: Every day | ORAL | Status: DC
Start: 2017-05-23 — End: 2017-05-24
  Administered 2017-05-23 – 2017-05-24 (×2): 400 mg via ORAL
  Filled 2017-05-23 (×2): qty 1

## 2017-05-23 MED ORDER — SENNOSIDES-DOCUSATE SODIUM 8.6-50 MG PO TABS
1.0000 | ORAL_TABLET | Freq: Every day | ORAL | Status: DC
  Administered 2017-05-23: 1 via ORAL
  Filled 2017-05-23: qty 1

## 2017-05-23 MED ORDER — POLYETHYLENE GLYCOL 3350 17 G PO PACK
17.0000 g | PACK | Freq: Every day | ORAL | Status: DC
Start: 2017-05-23 — End: 2017-05-24
  Administered 2017-05-23 – 2017-05-24 (×2): 17 g via ORAL
  Filled 2017-05-23 (×2): qty 1

## 2017-05-23 NOTE — Progress Notes (Signed)
PROGRESS NOTE   Angela Hines  DJM:[id number]    DOB: 1932-05-29    DOA: 05/15/2017  PCP: Kelton Pillar, MD   I have briefly reviewed patients previous medical records in [facility].  Brief Narrative:  5 F, PMH of chronic diastolic CHF, bioprosthetic AVR 2005, COPD, HTN, DM, HLD, PAF s/p TEE/DVCCV 07/2014 + loop for syncope --scheduled for ablation 1/23, recent syncopal episode presented with worsening dyspnea and confusion.  She required CPAP via EMS and then BiPAP in ED.  Admitted for acute respiratory failure with hypoxia due to suspected pneumonia.,  COPD exacerbation and CHF.  Able to come off BiPAP.  Slowly improving.  EP Cardiology adjusting medications for difficult to control A. fib with RVR >this is the current barrier to DC.   Assessment & Plan:   Principal Problem:   Acute and chronic respiratory failure with hypoxia (HCC) Active Problems:   COPD with acute exacerbation (HCC)   Diabetes mellitus ([place])   Gastroesophageal reflux disease without esophagitis   Hypertension   AF (paroxysmal atrial fibrillation) (HCC)   Sepsis ([place])   Community acquired pneumonia   Acute respiratory failure with hypoxia (HCC)   Acute on chronic diastolic CHF (congestive heart failure) (HCC)   Prolonged QT interval   Acute respiratory failure with hypoxia :multifactorial 2/2 COPD exacerbation, decompensated CHF and possible pneumonia.  Treat underlying cause.  Required CPAP >BiPAP on admission. Incentive spirometry and flutter valve.  Hypoxia resolved for several days now. COPD exacerbation: Flutter valve.  Discontinued levofloxacin due to QT prolongation and transient torsades.  Oral doxycycline, completed course.  Changed IV Solu-Medrol to oral prednisone taper, reduced to 20 mg-had 9 days ending 1/30. Improved. Acute on chronic diastolic CHF:-weight at cardiology office visit 10/29018 104, - 2.4  L since admission.  Changed IV Lasix 40 mg twice daily to 40 mg orally daily from 1/25.   2D echo 01/12/16: "Good LV pumping strength" and no EF documented.  Compensated. Possible HCAP: Blood cultures negative to date, MRSA PCR negative.  ? all related to CHF/COPD. Speech therapy recommends regular diet and thin liquids. Follow-up chest x-ray 1/24 showed interim clearing of basilar pulmonary infiltrate/edema.  Completed course of antibiotics. Paroxysmal A. Fib: CHA2DS2Vasc is 5, on Xarelto 15mg  dose (reduced dose due to decreased creatinine clearance). She had a couple of hours of RVR early morning 1/23.  Started on Cardizem drip and converted to sinus rhythm. EP cardiology consulted, stat EKG showed marked QT prolongation, noted transient torsades on 1/23.  Cardiology discontinued sotalol, levofloxacin, Cardizem drip and metoprolol. EP Cardiology following and adjusting medications.  Metoprolol increased to 50 mg twice daily.  This is her barrier for discharge at this time.  Controlled at rest but RVR up to 150s-160s with minimal activity.  EP Cardiology have started patient on Amiodarone 200 mg twice daily on 1/28--further dispo and input as per them-logn discussion with family who understands--I am doubtful any type of Cardioversion would hold given her lung disease Confusion/toxic encephalopathy: Probably related to medications (Benadryl, Ambien and Ativan).  Resolved. Type II DM with neuropathy: Hold metformin.  Continue SSI.  sugrs 105--204, cont gabapentin 600 hs Possible sepsis versus SIRS: Present on admission.  Resolved. GERD: PPI.  Has intermittent heartburn symptoms.  Adding TUMS 500 tid 1/29--stable Anemia: Hemoglobin stabilized in 12 range Status post bioprosthetic AVR: Essential hypertension: holding losartan 1/29 Prolonged QTC: EKG 05/16/17 at 6:08 AM: 483 ms.  Discontinued and avoid precipitating medications.  Keep magnesium >2 and potassium >4.  QTC 413 ms on 1/28.  Increased K Dur to 40 Meq bid.  Follow BMP in a.m. Leukocytosis: Likely related to steroid use,  improving--steroids off now     DVT prophylaxis: Xarelto Code Status: DNR Family Communication: None at bedside. Disposition: DC SNF when Hr better controlled from [facility].  Discussed with RN, ambulate and PT to see.  Consultants:  EP cardiology  Procedures:  BiPAP-discontinued  Antimicrobials:  Discontinued vancomycin and levofloxacin Oral doxycycline 1/23 >1/27   Subjective:  Awake alert still some tightness in belly when eating No stool x 3 days  No cough, no wheeze no cp HR 'went real high' when walking yesterday  Objective:  Vitals:   05/22/17 2345 05/23/17 0409 05/23/17 0428 05/23/17 0803  BP: 91/68  90/63 110/72  Pulse: 96  66 63  Resp: (!) 25  (!) 22 (!) 22  Temp: 97.7 F (36.5 C)  97.8 F (36.6 C) 97.7 F (36.5 C)  TempSrc: Oral  Oral Oral  SpO2: 97%  98% 99%  Weight:  48 kg (105 lb 14.4 oz)    Height:        Examination:  Frail pleasant Awake alert pleasant in nadno pallor no ict, PERRLA No jvd,  Tachy 120's-no discernible m cta b, no adde dsound, no wheeze No tenderness no gaurding, no rebound No le edema    Data Reviewed: I have personally reviewed following labs and imaging studies  CBC: Recent Labs  Lab 05/17/17 0214 05/18/17 0309 05/19/17 0337 05/22/17 0321  WBC 17.2* 14.3* 12.4* 13.5*  NEUTROABS  --   --   --  7.3  HGB 11.0* 10.5* 11.8* 12.0  HCT 33.9* 32.7* 37.0 37.2  MCV 95.8 96.2 96.9 96.4  PLT 309 294 314 606   Basic Metabolic Panel: Recent Labs  Lab 05/16/17 1441 05/17/17 0214 05/18/17 0309 05/19/17 0337 05/22/17 0321 05/23/17 0223  NA  --  139 138 136 136 133*  K  --  3.5 3.5 4.3 4.6 4.6  CL  --  104 103 102 101 100*  CO2  --  22 24 23 24 22   GLUCOSE  --  171* 126* 130* 124* 214*  BUN  --  21* 25* 21* 27* 25*  CREATININE  --  0.69 0.56 0.69 0.72 0.74  CALCIUM  --  8.8* 8.9 9.1 9.2 8.9  MG 2.6* 2.1 1.9  --  1.9 1.8   Liver Function Tests: Recent Labs  Lab 05/23/17 0223  AST 22  ALT 23    ALKPHOS 75  BILITOT 0.4  PROT 5.8*  ALBUMIN 3.0*   CBG: Recent Labs  Lab 05/22/17 0602 05/22/17 1118 05/22/17 1607 05/22/17 2115 05/23/17 0643  GLUCAP 118* 185* 138* 158* 105*    Recent Results (from the past 240 hour(s))  Blood Culture (routine x 2)     Status: None   Collection Time: 05/15/17  8:35 PM  Result Value Ref Range Status   Specimen Description BLOOD RIGHT ANTECUBITAL  Final   Special Requests   Final    BOTTLES DRAWN AEROBIC AND ANAEROBIC Blood Culture adequate volume   Culture NO GROWTH 5 DAYS  Final   Report Status 05/20/2017 FINAL  Final  Blood Culture (routine x 2)     Status: None   Collection Time: 05/15/17  9:06 PM  Result Value Ref Range Status   Specimen Description BLOOD LEFT ANTECUBITAL  Final   Special Requests   Final    BOTTLES DRAWN AEROBIC AND ANAEROBIC Blood  Culture adequate volume   Culture NO GROWTH 5 DAYS  Final   Report Status 05/20/2017 FINAL  Final  Urine culture     Status: Abnormal   Collection Time: 05/15/17 10:58 PM  Result Value Ref Range Status   Specimen Description URINE, CATHETERIZED  Final   Special Requests NONE  Final   Culture <10,000 COLONIES/mL INSIGNIFICANT GROWTH (A)  Final   Report Status 05/17/2017 FINAL  Final  MRSA PCR Screening     Status: None   Collection Time: 05/16/17  1:57 AM  Result Value Ref Range Status   MRSA by PCR NEGATIVE NEGATIVE Final    Comment:        The GeneXpert MRSA Assay (FDA approved for NASAL specimens only), is one component of a comprehensive MRSA colonization surveillance program. It is not intended to diagnose MRSA infection nor to guide or monitor treatment for MRSA infections.          Radiology Studies: No results found.      Scheduled Meds: . amiodarone  200 mg Oral BID  . atorvastatin  20 mg Oral QPM  . furosemide  40 mg Oral Daily  . gabapentin  600 mg Oral QHS  . insulin aspart  0-15 Units Subcutaneous TID WC  . LORazepam  1 mg Oral QHS  . magnesium  oxide  400 mg Oral Daily  . metoprolol tartrate  50 mg Oral BID  . pantoprazole  40 mg Oral Daily  . potassium chloride  40 mEq Oral BID  . predniSONE  20 mg Oral Q breakfast  . Rivaroxaban  15 mg Oral Q supper  . sodium chloride flush  3 mL Intravenous Q12H   Continuous Infusions: . sodium chloride       LOS: 8 days    Verneita Griffes, MD [facility] ([phone]   If 7PM-7AM, please contact night-coverage [email or web address] Password [email or web address] 05/23/2017, 9:18 AM

## 2017-05-23 NOTE — Progress Notes (Signed)
Electrophysiology Rounding Note  Patient Name: Angela Hines Date of Encounter: 05/23/2017  Primary Cardiologist: Johnsie Cancel Electrophysiologist: Allred   Subjective   The patient is doing well today.  At this time, the patient denies chest pain, shortness of breath, or any new concerns. She is concerned about going home today.   Inpatient Medications    Scheduled Meds: . amiodarone  200 mg Oral BID  . atorvastatin  20 mg Oral QPM  . furosemide  40 mg Oral Daily  . gabapentin  600 mg Oral QHS  . insulin aspart  0-15 Units Subcutaneous TID WC  . LORazepam  1 mg Oral QHS  . magnesium oxide  400 mg Oral Daily  . metoprolol tartrate  50 mg Oral BID  . pantoprazole  40 mg Oral Daily  . potassium chloride  40 mEq Oral BID  . Rivaroxaban  15 mg Oral Q supper  . sodium chloride flush  3 mL Intravenous Q12H   Continuous Infusions: . sodium chloride     PRN Meds: sodium chloride, albuterol, levalbuterol, LORazepam **AND** LORazepam, sodium chloride flush, zolpidem   Vital Signs    Vitals:   05/22/17 2345 05/23/17 0409 05/23/17 0428 05/23/17 0803  BP: 91/68  90/63 110/72  Pulse: 96  66 63  Resp: (!) 25  (!) 22 (!) 22  Temp: 97.7 F (36.5 C)  97.8 F (36.6 C) 97.7 F (36.5 C)  TempSrc: Oral  Oral Oral  SpO2: 97%  98% 99%  Weight:  105 lb 14.4 oz (48 kg)    Height:        Intake/Output Summary (Last 24 hours) at 05/23/2017 1019 Last data filed at 05/23/2017 0803 Gross per 24 hour  Intake 240 ml  Output -  Net 240 ml   Filed Weights   05/15/17 2041 05/16/17 0100 05/23/17 0409  Weight: 110 lb (49.9 kg) 111 lb 12.4 oz (50.7 kg) 105 lb 14.4 oz (48 kg)    Physical Exam    GEN- The patient is well appearing, alert and oriented x 3 today.   Head- normocephalic, atraumatic Eyes-  Sclera clear, conjunctiva pink Ears- hearing intact Oropharynx- clear Neck- supple Lungs- Clear to ausculation bilaterally, normal work of breathing Heart- Regular rate and rhythm, no  murmurs, rubs or gallops GI- soft, NT, ND, + BS Extremities- no clubbing, cyanosis, or edema Skin- no rash or lesion Psych- euthymic mood, full affect Neuro- strength and sensation are intact  Labs    CBC Recent Labs    05/22/17 0321  WBC 13.5*  NEUTROABS 7.3  HGB 12.0  HCT 37.2  MCV 96.4  PLT 562   Basic Metabolic Panel Recent Labs    05/22/17 0321 05/23/17 0223  NA 136 133*  K 4.6 4.6  CL 101 100*  CO2 24 22  GLUCOSE 124* 214*  BUN 27* 25*  CREATININE 0.72 0.74  CALCIUM 9.2 8.9  MG 1.9 1.8   Liver Function Tests Recent Labs    05/23/17 0223  AST 22  ALT 23  ALKPHOS 75  BILITOT 0.4  PROT 5.8*  ALBUMIN 3.0*   Thyroid Function Tests Recent Labs    05/22/17 0321  TSH 0.659    Telemetry    AF, rates improved (personally reviewed)  Radiology    No results found.  Assessment & Plan    1.  Persistent atrial fibrillation V rates improved today, but still not ideal She is symptomatically much improved Will continue po amiodarone load She and her son  would like to stay one more day in hospital Can consider outpatient DCCV after 2 weeks of amio load - discussed with family today Continue [place] for [place] of 5  2.  Prolonged QT/torsades Occurred in the setting of sotalol and levaquin Resolved Avoid QT prolonging drugs  3.  Shortness of breath Improved  Ambulate as much as possible today  Signed, Chanetta Marshall, NP  05/23/2017, 10:19 AM   EP Attending  Patient seen and examined. Agree with above. She has been placed on amiodarone by Dr. Greggory Brandy. Her ventricular rates are better today. She appears to be stable for DC. She will need early followup.   Mikle Bosworth.D.

## 2017-05-23 NOTE — Progress Notes (Signed)
Physical Therapy Treatment Patient Details Name: Angela Hines MRN: [id number] DOB: 06-21-1932 Today's Date: 05/23/2017    History of Present Illness 84 yo female with onset of acute respiratory failure with hypoxia and PNA was admitted and noted a-fib with RVR.  Very uncontrolled this AM. Has sepsis vs SIRS per chart.  Definite confusion from toxic encephalopathy. PMHx:  CHF, bioprosthetic AVR, COPD, HTN, DM, PAF, ablation planned,84 yo female with onset of acute respiratory failure with hypoxia and PNA was admitted and noted a-fib with RVR.  Very uncontrolled this AM. Has sepsis vs SIRS per chart.  Definite confusion from toxic encephalopathy. PMHx:  CHF, bioprosthetic AVR, COPD, HTN, DM, PAF, ablation planned,    PT Comments    Pt performed increased activity but remains to present with balance and functional deficits.  Plan next session introduction of device.  HR better controlled this session but patient continues to benefit from skilled rehab in a post acute setting to improve strength and function to return home.    Follow Up Recommendations  SNF     Equipment Recommendations  Rolling walker with 5" wheels    Recommendations for Other Services       Precautions / Restrictions      Mobility  Bed Mobility Overal bed mobility: Modified Independent Bed Mobility: Supine to Sit     Supine to sit: Modified independent (Device/Increase time)     General bed mobility comments: No assistance to sit edge of bed.    Transfers Overall transfer level: Needs assistance Equipment used: Rolling walker (2 wheeled);1 person hand held assist Transfers: Sit to/from Stand Sit to Stand: Min guard;Min assist         General transfer comment: slightly unsteady upon standing and min assist to maintain.    Ambulation/Gait Ambulation/Gait assistance: Min guard;Min assist Ambulation Distance (Feet): 150 Feet(x2) Assistive device: 1 person hand held assist Gait Pattern/deviations:  Step-through pattern;Trunk flexed;Narrow base of support;Staggering right;Drifts right/left;Staggering left Gait velocity: reduced Gait velocity interpretation: Below normal speed for age/gender General Gait Details: Pt with LOB multiple times and intermittent furniture walking and reaching for walls and railings.  Pt performed without device.  Pt would benefit from device next session to improve balance until strength improves.     Stairs            Wheelchair Mobility    Modified Rankin (Stroke Patients Only)       Balance Overall balance assessment: Needs assistance;History of Falls Sitting-balance support: Feet supported Sitting balance-Leahy Scale: Fair       Standing balance-Leahy Scale: Fair                              Cognition Arousal/Alertness: Awake/alert Behavior During Therapy: Impulsive Overall Cognitive Status: Impaired/Different from baseline                           Safety/Judgement: Decreased awareness of safety;Decreased awareness of deficits     General Comments: Pt staggering without device.  But seems unaware of her deficits.        Exercises General Exercises - Lower Extremity Hip Flexion/Marching: AROM;Both;10 reps;Supine Heel Raises: AROM;Both;10 reps;Supine Mini-Sqauts: AROM;Both;10 reps;Supine    General Comments        Pertinent Vitals/Pain Pain Assessment: No/denies pain    Home Living  Prior Function            PT Goals (current goals can now be found in the care plan section) Acute Rehab PT Goals Patient Stated Goal: to be able to get home with safe transition Potential to Achieve Goals: Good Progress towards PT goals: Progressing toward goals    Frequency    Min 2X/week      PT Plan Current plan remains appropriate    Co-evaluation              AM-PAC PT "6 Clicks" Daily Activity  Outcome Measure  Difficulty turning over in bed (including  adjusting bedclothes, sheets and blankets)?: None Difficulty moving from lying on back to sitting on the side of the bed? : A Little Difficulty sitting down on and standing up from a chair with arms (e.g., wheelchair, bedside commode, etc,.)?: Unable Help needed moving to and from a bed to chair (including a wheelchair)?: A Little Help needed walking in hospital room?: A Little Help needed climbing 3-5 steps with a railing? : A Little 6 Click Score: 17    End of Session Equipment Utilized During Treatment: Gait belt Activity Tolerance: Patient tolerated treatment well;Treatment limited secondary to medical complications (Comment) Patient left: in bed;with call bell/phone within reach;with bed alarm set Nurse Communication: Mobility status PT Visit Diagnosis: Unsteadiness on feet (R26.81);Other abnormalities of gait and mobility (R26.89);Muscle weakness (generalized) (M62.81);Difficulty in walking, not elsewhere classified (R26.2);Adult, failure to thrive (R62.7)     Time: 6578-4696 PT Time Calculation (min) (ACUTE ONLY): 13 min  Charges:  $Gait Training: 8-22 mins                    G Codes:       Governor Rooks, PTA pager [phone]    Cristela Blue 05/23/2017, 3:42 PM

## 2017-05-23 NOTE — Progress Notes (Signed)
Patient ambulated in the hall on room air, ambulation well tolerated, will continue to monitor.

## 2017-05-24 LAB — COMPREHENSIVE METABOLIC PANEL
ALT: 27 U/L (ref 14–54)
AST: 23 U/L (ref 15–41)
Albumin: 3 g/dL — ABNORMAL LOW (ref 3.5–5.0)
Alkaline Phosphatase: 67 U/L (ref 38–126)
Anion gap: 9 (ref 5–15)
BUN: 27 mg/dL — AB (ref 6–20)
CALCIUM: 8.8 mg/dL — AB (ref 8.9–10.3)
CHLORIDE: 103 mmol/L (ref 101–111)
CO2: 25 mmol/L (ref 22–32)
CREATININE: 0.78 mg/dL (ref 0.44–1.00)
GFR calc Af Amer: >60 mL/min (ref >60)
Glucose, Bld: 124 mg/dL — ABNORMAL HIGH (ref 65–99)
Potassium: 4.7 mmol/L (ref 3.5–5.1)
Sodium: 137 mmol/L (ref 135–145)
Total Bilirubin: 0.3 mg/dL (ref 0.3–1.2)
Total Protein: 5.8 g/dL — ABNORMAL LOW (ref 6.5–8.1)

## 2017-05-24 LAB — GLUCOSE, CAPILLARY
Glucose-Capillary: 115 mg/dL — ABNORMAL HIGH (ref 65–99)
Glucose-Capillary: 119 mg/dL — ABNORMAL HIGH (ref 65–99)

## 2017-05-24 LAB — MAGNESIUM: MAGNESIUM: 2 mg/dL (ref 1.7–2.4)

## 2017-05-24 MED ORDER — ZOLPIDEM TARTRATE 5 MG PO TABS: 5.0000 mg | ORAL_TABLET | Freq: Every evening | ORAL | 0 refills | Status: DC | PRN

## 2017-05-24 MED ORDER — AMIODARONE HCL 200 MG PO TABS: 200.0000 mg | ORAL_TABLET | Freq: Two times a day (BID) | ORAL | 0 refills | Status: DC

## 2017-05-24 MED ORDER — MAGNESIUM OXIDE 400 (241.3 MG) MG PO TABS: 400.0000 mg | ORAL_TABLET | Freq: Every day | ORAL | 0 refills | Status: DC

## 2017-05-24 MED ORDER — METOPROLOL TARTRATE 50 MG PO TABS: 50.0000 mg | ORAL_TABLET | Freq: Two times a day (BID) | ORAL | 0 refills | Status: DC

## 2017-05-24 NOTE — NC FL2 (Signed)
[place] LEVEL OF CARE SCREENING TOOL     IDENTIFICATION  Patient Name: Angela Hines Birthdate: 03-24-1933 Sex: female Admission Date (Current Location): 05/15/2017  [facility] [place] Number:  Herbalist and Address:  [facility], [place] [address], [place],  [address]      Provider Number: [id number]  Attending Physician Name and Address:  Nita Sells, MD  Relative Name and Phone Number:  Blanca Carreon, [phone]    Current Level of Care: Hospital Recommended Level of Care: [place] Prior Approval Number:    Date Approved/Denied:   PASRR Number: [id number]  Discharge Plan: SNF    Current Diagnoses: Patient Active Problem List   Diagnosis Date Noted  . Acute on chronic diastolic CHF (congestive heart failure) ([place])   . Prolonged QT interval   . Sepsis ([place]) 05/15/2017  . Acute and chronic respiratory failure with hypoxia ([place]) 05/15/2017  . Healthcare-associated pneumonia 05/15/2017  . Community acquired pneumonia 05/15/2017  . Acute respiratory failure with hypoxia ([place]) 05/15/2017  . COPD with acute exacerbation ([place]) 07/17/2016  . Diabetes mellitus ([place]) 07/17/2016  . Gastroesophageal reflux disease without esophagitis 07/17/2016  . Generalized anxiety disorder 07/17/2016  . Hypercholesterolemia 07/17/2016  . Hypertension 07/17/2016  . Status post placement of implantable loop recorder 07/17/2016  . AF (paroxysmal atrial fibrillation) ([place]) 07/17/2016  . Primary insomnia 07/17/2016  . S/P AVR (aortic valve replacement) 07/17/2016  . History of syncope 08/07/2014  . Atrial fibrillation with RVR ([place]) 07/29/2014    Orientation RESPIRATION BLADDER Height & Weight     Self, Situation, Place, Time  Normal Continent Weight: 105 lb 14.4 oz (48 kg) Height:  5\' 7"  (170.2 cm)  BEHAVIORAL SYMPTOMS/MOOD NEUROLOGICAL BOWEL NUTRITION STATUS      Continent Diet(regular)  AMBULATORY  STATUS COMMUNICATION OF NEEDS Skin   Limited Assist Verbally Normal                       Personal Care Assistance Level of Assistance  Bathing, Feeding, Dressing Bathing Assistance: Limited assistance Feeding assistance: Independent Dressing Assistance: Limited assistance     Functional Limitations Info  Sight, Hearing, Speech Sight Info: Adequate Hearing Info: Adequate Speech Info: Adequate    SPECIAL CARE FACTORS FREQUENCY  PT (By licensed PT), OT (By licensed OT)     PT Frequency: 5x wk OT Frequency: 5x wk            Contractures Contractures Info: Not present    Additional Factors Info  Code Status, Allergies Code Status Info: DNR Allergies Info: CLONIDINE, CAPTOPRIL, CODEINE, CECLOR CEFACLOR           Current Medications (05/24/2017):  This is the current hospital active medication list Current Facility-Administered Medications  Medication Dose Route Frequency Provider Last Rate Last Dose  . 0.9 %  sodium chloride infusion  250 mL Intravenous PRN Garba, Mohammad L, MD      . albuterol (PROVENTIL) (2.5 MG/3ML) 0.083% nebulizer solution 2.5 mg  2.5 mg Inhalation Q6H PRN Elwyn Reach, MD      . amiodarone (PACERONE) tablet 200 mg  200 mg Oral BID Chanetta Marshall K, NP   200 mg at 05/24/17 0850  . atorvastatin (LIPITOR) tablet 20 mg  20 mg Oral QPM Jonelle Sidle, Mohammad L, MD   20 mg at 05/23/17 1738  . furosemide (LASIX) tablet 40 mg  40 mg Oral Daily Hongalgi, Lenis Dickinson, MD   40 mg at  05/24/17 0850  . gabapentin (NEURONTIN) capsule 600 mg  600 mg Oral QHS Gala Romney L, MD   600 mg at 05/23/17 2230  . insulin aspart (novoLOG) injection 0-15 Units  0-15 Units Subcutaneous TID WC Elwyn Reach, MD   5 Units at 05/23/17 1806  . levalbuterol (XOPENEX) nebulizer solution 0.63 mg  0.63 mg Nebulization Q6H PRN Blount, Scarlette Shorts T, NP      . LORazepam (ATIVAN) tablet 1 mg  1 mg Oral QHS Elwyn Reach, MD   1 mg at 05/23/17 2230   And  . LORazepam (ATIVAN) tablet  1 mg  1 mg Oral Daily PRN Elwyn Reach, MD   1 mg at 05/19/17 1711  . magnesium oxide (MAG-OX) tablet 400 mg  400 mg Oral Daily Nita Sells, MD   400 mg at 05/24/17 0850  . metoprolol tartrate (LOPRESSOR) tablet 50 mg  50 mg Oral BID Evans Lance, MD   50 mg at 05/24/17 0849  . pantoprazole (PROTONIX) EC tablet 40 mg  40 mg Oral Daily Elwyn Reach, MD   40 mg at 05/24/17 0850  . polyethylene glycol (MIRALAX / GLYCOLAX) packet 17 g  17 g Oral Daily Nita Sells, MD   17 g at 05/24/17 0850  . potassium chloride SA (K-DUR,KLOR-CON) CR tablet 40 mEq  40 mEq Oral BID Modena Jansky, MD   40 mEq at 05/24/17 0849  . Rivaroxaban (XARELTO) tablet 15 mg  15 mg Oral Q supper Elwyn Reach, MD   15 mg at 05/23/17 1738  . senna-docusate (Senokot-S) tablet 1 tablet  1 tablet Oral QHS Nita Sells, MD   1 tablet at 05/23/17 2228  . sodium chloride flush (NS) 0.9 % injection 3 mL  3 mL Intravenous Q12H Elwyn Reach, MD   3 mL at 05/24/17 0856  . sodium chloride flush (NS) 0.9 % injection 3 mL  3 mL Intravenous PRN Gala Romney L, MD      . zolpidem (AMBIEN) tablet 5 mg  5 mg Oral QHS PRN Schorr, Rhetta Mura, NP   5 mg at 05/23/17 2242     Discharge Medications: Please see discharge summary for a list of discharge medications.  Relevant Imaging Results:  Relevant Lab Results:   Additional Information [phone]  Wende Neighbors, LCSW

## 2017-05-24 NOTE — Progress Notes (Signed)
Electrophysiology Rounding Note  Patient Name: Angela Hines Date of Encounter: 05/24/2017  Primary Cardiologist: Johnsie Cancel Electrophysiologist: Allred   Subjective   Feeling much improved today. Remains in AF with improved rate control.  Inpatient Medications    Scheduled Meds: . amiodarone  200 mg Oral BID  . atorvastatin  20 mg Oral QPM  . furosemide  40 mg Oral Daily  . gabapentin  600 mg Oral QHS  . insulin aspart  0-15 Units Subcutaneous TID WC  . LORazepam  1 mg Oral QHS  . magnesium oxide  400 mg Oral Daily  . metoprolol tartrate  50 mg Oral BID  . pantoprazole  40 mg Oral Daily  . polyethylene glycol  17 g Oral Daily  . potassium chloride  40 mEq Oral BID  . Rivaroxaban  15 mg Oral Q supper  . senna-docusate  1 tablet Oral QHS  . sodium chloride flush  3 mL Intravenous Q12H   Continuous Infusions: . sodium chloride     PRN Meds: sodium chloride, albuterol, levalbuterol, LORazepam **AND** LORazepam, sodium chloride flush, zolpidem   Vital Signs    Vitals:   05/23/17 2231 05/24/17 0009 05/24/17 0308 05/24/17 0323  BP: 102/71 (!) 86/57 (!) 74/45 90/68  Pulse: 95   97  Resp:      Temp:  98.9 F (37.2 C) (!) 97.5 F (36.4 C)   TempSrc:  Oral Oral   SpO2:    99%  Weight:      Height:        Intake/Output Summary (Last 24 hours) at 05/24/2017 0746 Last data filed at 05/23/2017 1300 Gross per 24 hour  Intake 480 ml  Output -  Net 480 ml   Filed Weights   05/15/17 2041 05/16/17 0100 05/23/17 0409  Weight: 110 lb (49.9 kg) 111 lb 12.4 oz (50.7 kg) 105 lb 14.4 oz (48 kg)    Physical Exam    GEN- The patient is well appearing, alert and oriented x 3 today.   Head- normocephalic, atraumatic Eyes-  Sclera clear, conjunctiva pink Ears- hearing intact Oropharynx- clear Neck- supple Lungs- Clear to ausculation bilaterally, normal work of breathing Heart- Regular rate and rhythm, no murmurs, rubs or gallops GI- soft, NT, ND, + BS Extremities- no  clubbing, cyanosis, or edema Skin- no rash or lesion Psych- euthymic mood, full affect Neuro- strength and sensation are intact  Labs    CBC Recent Labs    05/22/17 0321  WBC 13.5*  NEUTROABS 7.3  HGB 12.0  HCT 37.2  MCV 96.4  PLT 160   Basic Metabolic Panel Recent Labs    05/23/17 0223 05/24/17 0445  NA 133* 137  K 4.6 4.7  CL 100* 103  CO2 22 25  GLUCOSE 214* 124*  BUN 25* 27*  CREATININE 0.74 0.78  CALCIUM 8.9 8.8*  MG 1.8 2.0   Liver Function Tests Recent Labs    05/23/17 0223 05/24/17 0445  AST 22 23  ALT 23 27  ALKPHOS 75 67  BILITOT 0.4 0.3  PROT 5.8* 5.8*  ALBUMIN 3.0* 3.0*   Thyroid Function Tests Recent Labs    05/22/17 0321  TSH 0.659    Telemetry    Atrial fibrillation (personally reviewed)  Radiology    No results found.  Assessment & Plan    1.  Persistent atrial fibrillation V rates improved on amiodarone 200 mg daily. Also on metoprolol with short pauses. Would continue at current doses. May require cardioversion in 2 weeks  after amiodarone loading OK to discharge from EP perspective with follow up in clinic to be arranged. Continue [place] for CHADS2VASC of 5  2.  Prolonged QT/torsades Occurred with sotalol and Levaquin Avoid QT prolonging drugs  3.  Shortness of breath improved  EP to sign off at this point. Do not hesitate to call back with questions.  Signed, Terrian Sentell Meredith Leeds, MD  05/24/2017, 7:46 AM

## 2017-05-24 NOTE — Progress Notes (Signed)
Clinical Social Worker facilitated patient discharge including contacting patient family and facility to confirm patient discharge plans.  Clinical information faxed to facility and family agreeable with plan.  CSW arranged ambulance transport via [facility] to [facility] .  RN to call [phone] (pt will go in room 3221)  report prior to discharge.  Clinical Social Worker will sign off for now as social work intervention is no longer needed. Please consult [place] again if new need arises.  Rhea Pink, MSW, [place]

## 2017-05-24 NOTE — Care Management Note (Signed)
Case Management Note Marvetta Gibbons RN, BSN Unit 4E-Case Manager [phone]  Patient Details  Name: Angela Hines MRN: [id number] Date of Birth: 1933-02-26  Subjective/Objective:  Pt admitted with acute on chronic resp. Failure, COPD, PNA, AFIB                  Action/Plan: PTA  Pt lived at [facility]- per PT eval recommendation for SNF level of care- CSW consulted for placement needs.   Expected Discharge Date:  05/24/17               Expected Discharge Plan:  [place]  In-House Referral:  Clinical Social Work  Discharge planning Services  CM Consult  Post Acute Care Choice:    Choice offered to:     DME Arranged:    DME Agency:     HH Arranged:    [place] Agency:     Status of Service:  Completed, signed off  If discussed at [employer] of Stay Meetings, dates discussed:    Discharge Disposition: skilled facility   Additional Comments:  05/24/17- 101- Marvetta Gibbons RN, CM- pt stable for discharge today- CSW following for placement needs- has secured a bed at [facility].   Dawayne Wilmary, RN 05/24/2017, 11:29 AM

## 2017-05-24 NOTE — Progress Notes (Signed)
Pt d/c to [facility] via [place] [facility]. IV d/c clean and intact. Telemetry removed. Pt belonging packed and sent with [facility].  Clyde Canterbury, RN

## 2017-05-24 NOTE — Clinical Social Work Placement (Signed)
   CLINICAL SOCIAL WORK PLACEMENT  NOTE  Date:  05/24/2017  Patient Details  Name: Angela Hines MRN: [id number] Date of Birth: 1933/03/14  Clinical Social Work is seeking post-discharge placement for this patient at the [place] level of care (*CSW will initial, date and re-position this form in  chart as items are completed):  Yes   Patient/family provided with [place] Work Department's list of facilities offering this level of care within the geographic area requested by the patient (or if unable, by the patient's family).  Yes   Patient/family informed of their freedom to choose among providers that offer the needed level of care, that participate in Medicare, Medicaid or managed care program needed by the patient, have an available bed and are willing to accept the patient.  Yes   Patient/family informed of [facility]'s ownership interest in [facility] and [facility], as well as of the fact that they are under no obligation to receive care at these facilities.  PASRR submitted to EDS on       PASRR number received on       Existing PASRR number confirmed on 05/24/17     FL2 transmitted to all facilities in geographic area requested by pt/family on 05/24/17     FL2 transmitted to all facilities within larger geographic area on       Patient informed that his/her managed care company has contracts with or will negotiate with certain facilities, including the following:            Patient/family informed of bed offers received.  Patient [facility] bed at [facility]     Physician recommends and patient chooses bed at      Patient to be transferred to [facility] on 05/24/17.  Patient to be transferred to facility by [facility]tar     Patient family notified on 05/24/17 of transfer.  Name of family member notified:  michael Sorter (son)     PHYSICIAN       Additional Comment:     _______________________________________________ Wende Neighbors, LCSW 05/24/2017, 10:01 AM

## 2017-05-24 NOTE — Discharge Summary (Signed)
Physician Discharge Summary  Angela Hines DXA:[id number] DOB: Jun 20, 1932 DOA: 05/15/2017  PCP: Kelton Pillar, MD  Admit date: 05/15/2017 Discharge date: 05/24/2017  Time spent: 35 minutes  Recommendations for Outpatient Follow-up:  1. Needs cmet and magensium in 1 week 2. Needs tsh in 1 mo 3. Need sOP cardiology visit to adjust meds in 1-2 weeks-mighyt consider dropping amioodarone dose from 200 bid to once daily in 1 week 4. Avoid qt prolonging agents--stopped this admit losartan nifedipine and sotolol 5. Needs work-up regarding abd pain as OP   Discharge Diagnoses:  Principal Problem:   Acute and chronic respiratory failure with hypoxia ([place]) Active Problems:   COPD with acute exacerbation (HCC)   Diabetes mellitus ([place])   Gastroesophageal reflux disease without esophagitis   Hypertension   AF (paroxysmal atrial fibrillation) (HCC)   Sepsis ([place])   Community acquired pneumonia   Acute respiratory failure with hypoxia (HCC)   Acute on chronic diastolic CHF (congestive heart failure) (HCC)   Prolonged QT interval   Discharge Condition: improved  Diet recommendation: hh low salt  Filed Weights   05/15/17 2041 05/16/17 0100 05/23/17 0409  Weight: 49.9 kg (110 lb) 50.7 kg (111 lb 12.4 oz) 48 kg (105 lb 14.4 oz)    History of present illness:  19 F, PMH of chronic diastolic CHF, bioprosthetic AVR 2005, COPD, HTN, DM, HLD, PAF s/p TEE/DVCCV 07/2014 + loop for syncope --scheduled for ablation 1/23, recent syncopal episode presented with worsening dyspnea and confusion.  She required CPAP via EMS and then BiPAP in ED.  Admitted for acute respiratory failure with hypoxia due to suspected pneumonia.,  COPD exacerbation and CHF.  Able to come off BiPAP.  Slowly improving.  EP Cardiology adjusting medications for difficult to control A. fib with RVR >this is the current barrier to DC.    Hospital Course:  Acute respiratory failure with hypoxia :multifactorial 2/2 COPD  exacerbation, decompensated CHF and possible pneumonia.  Treat underlying cause.  Required CPAP >BiPAP on admission. Incentive spirometry and flutter valve.  Hypoxia resolved  COPD exacerbation: Flutter valve.  Discontinued levofloxacin due to QT prolongation and transient torsades.  Oral doxycycline, completed course.  Changed IV Solu-Medrol to oral prednisone taper, reduced to 20 mg-had 9 days ending 1/30. Improved. Acute on chronic diastolic CHF:-weight at cardiology office visit 10/29018 104, - 2.4  L since admission.  Changed IV Lasix 40 mg twice daily to 40 mg orally daily from 1/25.  2D echo 01/12/16: "Good LV pumping strength" and no EF documented.  Compensated. Possible HCAP: Blood cultures negative to date, MRSA PCR negative.  ? all related to CHF/COPD. Speech therapy recommends regular diet and thin liquids. Follow-up chest x-ray 1/24 showed interim clearing of basilar pulmonary infiltrate/edema.   Paroxysmal A. Fib: CHA2DS2Vasc is 5, on Xarelto 15mg  dose (reduced dose due to decreased creatinine clearance). She had a couple of hours of RVR early morning 1/23.  Started on Cardizem drip and converted to sinus rhythm. EP cardiology consulted, stat EKG showed marked QT prolongation, noted transient torsades on 1/23.  Cardiology discontinued sotalol, levofloxacin, Cardizem drip and metoprolol. Metoprolol increased to 50 mg twice daily.  Controlled at rest but RVR up to 150s-160s with minimal activity.  EP Cardiology have started patient on Amiodarone 200 mg twice daily on 1/28--might need downward adjustment as OP Confusion/toxic encephalopathy: Probably related to medications (Benadryl, Ambien and Ativan).  Resolved. Type II DM with neuropathy: Hold metformin. Resumed on d/c, cont gabapentin 600 hs Possible sepsis versus SIRS:  Present on admission.  Resolved. GERD: PPI.  Has intermittent heartburn symptoms.  Adding TUMS 500 tid 1/29--stable Anemia: Hemoglobin stabilized in 12 range Status post  bioprosthetic AVR: Essential hypertension: holding losartan 1/29 Prolonged QTC: EKG 05/16/17 at 6:08 AM: 483 ms.  Discontinued and avoid precipitating medications.  Keep magnesium >2 and potassium >4.  QTC 413 ms on 1/28.  Increased K Dur to 40 Meq bid.  Follow BMP in a.m. Leukocytosis: Likely related to steroid use, improving--steroids off now    Procedures: echo  Consultations:   EP  Discharge Exam: Vitals:   05/24/17 0308 05/24/17 0323  BP: (!) 74/45 90/68  Pulse:  97  Resp:    Temp: (!) 97.5 F (36.4 C)   SpO2:  99%    General: awake alert eating breakfast in no distress tol diet, no cp Cardiovascular: s1 s2 afib-rate controlled Respiratory:clear no added sound abd soft No jvd no edema  Discharge Instructions   Discharge Instructions    Diet - low sodium heart healthy   Complete by:  As directed    Discharge instructions   Complete by:  As directed    Notice the changes to your medications regarding your Heart rate control medications-you might need a drop in the dose of your Amiodarone going forward You will need some labs periodically-The cardiologist will discuss with you regarding other plans for heart rate control   Increase activity slowly   Complete by:  As directed      Allergies as of 05/24/2017      Reactions   Clonidine    "head swelling"   Captopril Swelling   Codeine     Nausea And Vomiting/GI Intolerance   Ceclor [cefaclor] Rash      Medication List    STOP taking these medications   losartan 100 MG tablet Commonly known as:  COZAAR   NIFEdipine 30 MG 24 hr tablet Commonly known as:  PROCARDIA-XL/ADALAT-CC/NIFEDICAL-XL   sotalol 80 MG tablet Commonly known as:  BETAPACE     TAKE these medications   albuterol 108 (90 Base) MCG/ACT inhaler Commonly known as:  PROVENTIL HFA;VENTOLIN HFA Inhale 2 puffs into the lungs every 6 (six) hours as needed for shortness of breath.   amiodarone 200 MG tablet Commonly known as:  PACERONE Take  1 tablet (200 mg total) by mouth 2 (two) times daily.   atorvastatin 20 MG tablet Commonly known as:  LIPITOR Take 20 mg by mouth every evening.   furosemide 20 MG tablet Commonly known as:  LASIX Take 20 mg by mouth daily.   gabapentin 300 MG capsule Commonly known as:  NEURONTIN Take 600 mg by mouth at bedtime.   LORazepam 0.5 MG tablet Commonly known as:  ATIVAN Take 0.5 mg by mouth See admin instructions. Take daily as needed for anxiety.  Take 1 tablet every night at bedtime.   magnesium oxide 400 (241.3 Mg) MG tablet Commonly known as:  MAG-OX Take 1 tablet (400 mg total) by mouth daily.   metFORMIN 1000 MG tablet Commonly known as:  GLUCOPHAGE Take 1,000 mg by mouth daily.   metoprolol tartrate 50 MG tablet Commonly known as:  LOPRESSOR Take 1 tablet (50 mg total) by mouth 2 (two) times daily. What changed:    medication strength  how much to take  when to take this  additional instructions   omeprazole 20 MG capsule Commonly known as:  PRILOSEC Take 40 mg by mouth daily.   potassium chloride 10 MEQ tablet Commonly  known as:  K-DUR,KLOR-CON Take 10 mEq by mouth daily.   Rivaroxaban 15 MG Tabs tablet Commonly known as:  XARELTO Take 15 mg by mouth daily with supper.      Allergies  Allergen Reactions  . Clonidine     "head swelling"  . Captopril Swelling  . Codeine      Nausea And Vomiting/GI Intolerance   . Ceclor [Cefaclor] Rash   Contact information for after-discharge care    Destination    [facility] .   Service:  Skilled Nursing Contact information: [address] [place] [phone]               The results of significant diagnostics from this hospitalization (including imaging, microbiology, ancillary and laboratory) are listed below for reference.    Significant Diagnostic Studies: Dg Chest Port 1 View  Result Date: 05/17/2017 CLINICAL DATA:  Congestive heart failure.  COPD. EXAM:  PORTABLE CHEST 1 VIEW COMPARISON:  05/15/2017. FINDINGS: Mediastinum and hilar structures are normal. Cardiac monitor again noted. Prior cardiac valve replacement. Fractured upper sternotomy wire is noted. This is unchanged. Heart size stable. Lungs are clear. Previously identified basilar infiltrates/edema have cleared no pleural effusion or pneumothorax. IMPRESSION: 1.  Interim clearing of basilar pulmonary infiltrates/edema 2. Prior cardiac valve replacement. Stable cardiomegaly. No pulmonary venous congestion. Electronically Signed   By: Marcello Moores  Register   On: 05/17/2017 08:17   Dg Chest Port 1 View  Result Date: 05/15/2017 CLINICAL DATA:  Shortness of breath EXAM: PORTABLE CHEST 1 VIEW COMPARISON:  01/10/2017 FINDINGS: Sternotomy wires. Hyperinflation. Diffuse increased interstitial opacity with patchy confluent airspace disease at the bilateral lung bases. Valvular replacement. Electronic recording device over the central lower chest borderline cardiomegaly with aortic atherosclerosis. Probable skin fold artifact over the left chest. IMPRESSION: 1. Borderline cardiomegaly with mild central vascular congestion. 2. Diffuse increased interstitial opacity suggesting mild edema 3. More confluent airspace disease at the bilateral lung bases, suspicious for superimposed pneumonia. Electronically Signed   By: Donavan Foil M.D.   On: 05/15/2017 21:08    Microbiology: Recent Results (from the past 240 hour(s))  Blood Culture (routine x 2)     Status: None   Collection Time: 05/15/17  8:35 PM  Result Value Ref Range Status   Specimen Description BLOOD RIGHT ANTECUBITAL  Final   Special Requests   Final    BOTTLES DRAWN AEROBIC AND ANAEROBIC Blood Culture adequate volume   Culture NO GROWTH 5 DAYS  Final   Report Status 05/20/2017 FINAL  Final  Blood Culture (routine x 2)     Status: None   Collection Time: 05/15/17  9:06 PM  Result Value Ref Range Status   Specimen Description BLOOD LEFT ANTECUBITAL   Final   Special Requests   Final    BOTTLES DRAWN AEROBIC AND ANAEROBIC Blood Culture adequate volume   Culture NO GROWTH 5 DAYS  Final   Report Status 05/20/2017 FINAL  Final  Urine culture     Status: Abnormal   Collection Time: 05/15/17 10:58 PM  Result Value Ref Range Status   Specimen Description URINE, CATHETERIZED  Final   Special Requests NONE  Final   Culture <10,000 COLONIES/mL INSIGNIFICANT GROWTH (A)  Final   Report Status 05/17/2017 FINAL  Final  MRSA PCR Screening     Status: None   Collection Time: 05/16/17  1:57 AM  Result Value Ref Range Status   MRSA by PCR NEGATIVE NEGATIVE Final    Comment:  The GeneXpert MRSA Assay (FDA approved for NASAL specimens only), is one component of a comprehensive MRSA colonization surveillance program. It is not intended to diagnose MRSA infection nor to guide or monitor treatment for MRSA infections.      Labs: Basic Metabolic Panel: Recent Labs  Lab 05/18/17 0309 05/19/17 0337 05/22/17 0321 05/23/17 0223 05/24/17 0445  NA 138 136 136 133* 137  K 3.5 4.3 4.6 4.6 4.7  CL 103 102 101 100* 103  CO2 24 23 24 22 25   GLUCOSE 126* 130* 124* 214* 124*  BUN 25* 21* 27* 25* 27*  CREATININE 0.56 0.69 0.72 0.74 0.78  CALCIUM 8.9 9.1 9.2 8.9 8.8*  MG 1.9  --  1.9 1.8 2.0   Liver Function Tests: Recent Labs  Lab 05/23/17 0223 05/24/17 0445  AST 22 23  ALT 23 27  ALKPHOS 75 67  BILITOT 0.4 0.3  PROT 5.8* 5.8*  ALBUMIN 3.0* 3.0*   No results for input(s): LIPASE, AMYLASE in the last 168 hours. No results for input(s): AMMONIA in the last 168 hours. CBC: Recent Labs  Lab 05/18/17 0309 05/19/17 0337 05/22/17 0321  WBC 14.3* 12.4* 13.5*  NEUTROABS  --   --  7.3  HGB 10.5* 11.8* 12.0  HCT 32.7* 37.0 37.2  MCV 96.2 96.9 96.4  PLT 294 314 297   Cardiac Enzymes: No results for input(s): CKTOTAL, CKMB, CKMBINDEX, TROPONINI in the last 168 hours. BNP: BNP (last 3 results) Recent Labs    05/15/17 2035  BNP  831.6*    ProBNP (last 3 results) No results for input(s): PROBNP in the last 8760 hours.  CBG: Recent Labs  Lab 05/23/17 0643 05/23/17 1210 05/23/17 1746 05/23/17 2137 05/24/17 0611  GLUCAP 105* 145* 204* 106* 115*       Signed:  Nita Sells MD   [facility] 05/24/2017, 8:06 AM

## 2017-05-29 ENCOUNTER — Telehealth: Payer: Self-pay | Admitting: Cardiology

## 2017-05-29 NOTE — Telephone Encounter (Signed)
Spoke w/ pt son and requested that he send a manual transmission b/c his home monitor has not updated in at least 14 days.

## 2017-06-05 ENCOUNTER — Telehealth: Payer: Self-pay | Admitting: *Deleted

## 2017-06-05 ENCOUNTER — Ambulatory Visit (INDEPENDENT_AMBULATORY_CARE_PROVIDER_SITE_OTHER): Payer: Medicare Other | Admitting: *Deleted

## 2017-06-05 DIAGNOSIS — I480 Paroxysmal atrial fibrillation: Secondary | ICD-10-CM | POA: Diagnosis not present

## 2017-06-05 NOTE — Telephone Encounter (Signed)
Opened in error

## 2017-06-06 NOTE — Progress Notes (Signed)
Carelink Summary Report / Loop Recorder 

## 2017-06-07 ENCOUNTER — Ambulatory Visit (HOSPITAL_BASED_OUTPATIENT_CLINIC_OR_DEPARTMENT_OTHER)
Admit: 2017-06-07 | Discharge: 2017-06-07 | Disposition: A | Discharge status: Home / Self Care | Payer: Medicare Other | Attending: Nurse Practitioner | Admitting: Nurse Practitioner

## 2017-06-07 ENCOUNTER — Ambulatory Visit (HOSPITAL_COMMUNITY): Payer: Medicare Other | Admitting: Nurse Practitioner

## 2017-06-07 ENCOUNTER — Encounter (HOSPITAL_COMMUNITY): Payer: Self-pay | Admitting: Nurse Practitioner

## 2017-06-07 ENCOUNTER — Other Ambulatory Visit (HOSPITAL_COMMUNITY): Payer: Self-pay | Admitting: *Deleted

## 2017-06-07 VITALS — BP 122/82 | HR 92 | Ht 67.0 in | Wt 114.8 lb

## 2017-06-07 DIAGNOSIS — Z7984 Long term (current) use of oral hypoglycemic drugs: Secondary | ICD-10-CM

## 2017-06-07 DIAGNOSIS — I480 Paroxysmal atrial fibrillation: Secondary | ICD-10-CM

## 2017-06-07 DIAGNOSIS — I509 Heart failure, unspecified: Secondary | ICD-10-CM | POA: Insufficient documentation

## 2017-06-07 DIAGNOSIS — Z9049 Acquired absence of other specified parts of digestive tract: Secondary | ICD-10-CM | POA: Insufficient documentation

## 2017-06-07 DIAGNOSIS — E119 Type 2 diabetes mellitus without complications: Secondary | ICD-10-CM

## 2017-06-07 DIAGNOSIS — I110 Hypertensive heart disease with heart failure: Secondary | ICD-10-CM

## 2017-06-07 DIAGNOSIS — Z8249 Family history of ischemic heart disease and other diseases of the circulatory system: Secondary | ICD-10-CM | POA: Insufficient documentation

## 2017-06-07 DIAGNOSIS — I4819 Other persistent atrial fibrillation: Secondary | ICD-10-CM

## 2017-06-07 DIAGNOSIS — Z952 Presence of prosthetic heart valve: Secondary | ICD-10-CM | POA: Insufficient documentation

## 2017-06-07 DIAGNOSIS — Z9889 Other specified postprocedural states: Secondary | ICD-10-CM

## 2017-06-07 DIAGNOSIS — J449 Chronic obstructive pulmonary disease, unspecified: Secondary | ICD-10-CM

## 2017-06-07 DIAGNOSIS — Z888 Allergy status to other drugs, medicaments and biological substances status: Secondary | ICD-10-CM

## 2017-06-07 DIAGNOSIS — Z833 Family history of diabetes mellitus: Secondary | ICD-10-CM

## 2017-06-07 DIAGNOSIS — Z950 Presence of cardiac pacemaker: Secondary | ICD-10-CM

## 2017-06-07 DIAGNOSIS — J9622 Acute and chronic respiratory failure with hypercapnia: Secondary | ICD-10-CM | POA: Diagnosis not present

## 2017-06-07 DIAGNOSIS — I481 Persistent atrial fibrillation: Secondary | ICD-10-CM | POA: Diagnosis not present

## 2017-06-07 DIAGNOSIS — I4581 Long QT syndrome: Secondary | ICD-10-CM | POA: Insufficient documentation

## 2017-06-07 DIAGNOSIS — Z881 Allergy status to other antibiotic agents status: Secondary | ICD-10-CM | POA: Insufficient documentation

## 2017-06-07 DIAGNOSIS — J441 Chronic obstructive pulmonary disease with (acute) exacerbation: Secondary | ICD-10-CM | POA: Diagnosis not present

## 2017-06-07 DIAGNOSIS — Z885 Allergy status to narcotic agent status: Secondary | ICD-10-CM

## 2017-06-07 DIAGNOSIS — Z7901 Long term (current) use of anticoagulants: Secondary | ICD-10-CM

## 2017-06-07 DIAGNOSIS — Z825 Family history of asthma and other chronic lower respiratory diseases: Secondary | ICD-10-CM

## 2017-06-07 DIAGNOSIS — Z823 Family history of stroke: Secondary | ICD-10-CM

## 2017-06-07 DIAGNOSIS — E7800 Pure hypercholesterolemia, unspecified: Secondary | ICD-10-CM

## 2017-06-07 DIAGNOSIS — Z79899 Other long term (current) drug therapy: Secondary | ICD-10-CM

## 2017-06-07 LAB — BASIC METABOLIC PANEL
Anion gap: 10 (ref 5–15)
BUN: 13 mg/dL (ref 6–20)
CO2: 24 mmol/L (ref 22–32)
CREATININE: 0.8 mg/dL (ref 0.44–1.00)
Calcium: 9.4 mg/dL (ref 8.9–10.3)
Chloride: 108 mmol/L (ref 101–111)
GFR calc Af Amer: >60 mL/min (ref >60)
GLUCOSE: 89 mg/dL (ref 65–99)
POTASSIUM: 2.9 mmol/L — AB (ref 3.5–5.1)
SODIUM: 142 mmol/L (ref 135–145)

## 2017-06-07 LAB — CBC
HCT: 35.5 % — ABNORMAL LOW (ref 36.0–46.0)
Hemoglobin: 11 g/dL — ABNORMAL LOW (ref 12.0–15.0)
MCH: 29.9 pg (ref 26.0–34.0)
MCHC: 31 g/dL (ref 30.0–36.0)
MCV: 96.5 fL (ref 78.0–100.0)
PLATELETS: 278 10*3/uL (ref 150–400)
RBC: 3.68 MIL/uL — AB (ref 3.87–5.11)
RDW: 15.1 % (ref 11.5–15.5)
WBC: 6.6 10*3/uL (ref 4.0–10.5)

## 2017-06-07 MED ORDER — AMIODARONE HCL 200 MG PO TABS: ORAL_TABLET | ORAL | 0 refills | Status: DC
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
## 2017-06-07 NOTE — Patient Instructions (Addendum)
Cardioversion scheduled for Friday, February 22nd  - Arrive at the [facility] and go to admitting at 10:30AM  -Do not eat or drink anything after midnight the night prior to your procedure.  - Take all your medication with a sip of water prior to arrival.  - Do NOT miss any doses of Xarelto - please call us at [phone] if you should miss a dose.  - You will not be able to drive home after your procedure.  ---- Decrease Amiodarone to 200mg  ONCE A DAY on Monday, February 25th  Follow up with Dr. Rayann Heman as scheduled on 3/4

## 2017-06-08 ENCOUNTER — Telehealth (HOSPITAL_COMMUNITY): Payer: Self-pay | Admitting: *Deleted

## 2017-06-08 MED ORDER — AMIODARONE HCL 200 MG PO TABS: 200.0000 mg | ORAL_TABLET | Freq: Every day | ORAL | 0 refills | Status: DC

## 2017-06-08 NOTE — Telephone Encounter (Signed)
Patients daughter in law called in today stating pt has actually only been taking amiodarone 200mg  once a day since leaving nursing facility on 2/6. Discussed with Roderic Palau NP will continue at 200mg  once a day - pt did receive BID for 2 weeks. Verbalized understanding.

## 2017-06-09 ENCOUNTER — Encounter (HOSPITAL_COMMUNITY): Payer: Self-pay | Admitting: Emergency Medicine

## 2017-06-09 ENCOUNTER — Other Ambulatory Visit: Payer: Self-pay

## 2017-06-09 ENCOUNTER — Inpatient Hospital Stay (HOSPITAL_COMMUNITY)
Admission: EM | Admit: 2017-06-09 | Discharge: 2017-06-12 | DRG: 189 | Disposition: A | Discharge status: Home Health | Payer: Medicare Other | Attending: Internal Medicine | Admitting: Internal Medicine

## 2017-06-09 ENCOUNTER — Emergency Department (HOSPITAL_COMMUNITY): Payer: Medicare Other

## 2017-06-09 DIAGNOSIS — J189 Pneumonia, unspecified organism: Secondary | ICD-10-CM | POA: Diagnosis present

## 2017-06-09 DIAGNOSIS — I480 Paroxysmal atrial fibrillation: Secondary | ICD-10-CM | POA: Diagnosis present

## 2017-06-09 DIAGNOSIS — F329 Major depressive disorder, single episode, unspecified: Secondary | ICD-10-CM | POA: Diagnosis present

## 2017-06-09 DIAGNOSIS — I5032 Chronic diastolic (congestive) heart failure: Secondary | ICD-10-CM | POA: Diagnosis present

## 2017-06-09 DIAGNOSIS — F411 Generalized anxiety disorder: Secondary | ICD-10-CM | POA: Diagnosis present

## 2017-06-09 DIAGNOSIS — J9622 Acute and chronic respiratory failure with hypercapnia: Principal | ICD-10-CM | POA: Diagnosis present

## 2017-06-09 DIAGNOSIS — Y95 Nosocomial condition: Secondary | ICD-10-CM | POA: Diagnosis present

## 2017-06-09 DIAGNOSIS — E119 Type 2 diabetes mellitus without complications: Secondary | ICD-10-CM | POA: Diagnosis present

## 2017-06-09 DIAGNOSIS — Z953 Presence of xenogenic heart valve: Secondary | ICD-10-CM | POA: Diagnosis not present

## 2017-06-09 DIAGNOSIS — Z885 Allergy status to narcotic agent status: Secondary | ICD-10-CM

## 2017-06-09 DIAGNOSIS — J449 Chronic obstructive pulmonary disease, unspecified: Secondary | ICD-10-CM | POA: Diagnosis present

## 2017-06-09 DIAGNOSIS — E785 Hyperlipidemia, unspecified: Secondary | ICD-10-CM | POA: Diagnosis present

## 2017-06-09 DIAGNOSIS — Z66 Do not resuscitate: Secondary | ICD-10-CM | POA: Diagnosis present

## 2017-06-09 DIAGNOSIS — Z7984 Long term (current) use of oral hypoglycemic drugs: Secondary | ICD-10-CM | POA: Diagnosis not present

## 2017-06-09 DIAGNOSIS — I10 Essential (primary) hypertension: Secondary | ICD-10-CM | POA: Diagnosis present

## 2017-06-09 DIAGNOSIS — E43 Unspecified severe protein-calorie malnutrition: Secondary | ICD-10-CM | POA: Diagnosis present

## 2017-06-09 DIAGNOSIS — Z79899 Other long term (current) drug therapy: Secondary | ICD-10-CM

## 2017-06-09 DIAGNOSIS — J9621 Acute and chronic respiratory failure with hypoxia: Secondary | ICD-10-CM | POA: Diagnosis present

## 2017-06-09 DIAGNOSIS — I110 Hypertensive heart disease with heart failure: Secondary | ICD-10-CM | POA: Diagnosis present

## 2017-06-09 DIAGNOSIS — Z950 Presence of cardiac pacemaker: Secondary | ICD-10-CM | POA: Diagnosis not present

## 2017-06-09 DIAGNOSIS — I4891 Unspecified atrial fibrillation: Secondary | ICD-10-CM | POA: Diagnosis present

## 2017-06-09 DIAGNOSIS — Z888 Allergy status to other drugs, medicaments and biological substances status: Secondary | ICD-10-CM | POA: Diagnosis not present

## 2017-06-09 DIAGNOSIS — J441 Chronic obstructive pulmonary disease with (acute) exacerbation: Secondary | ICD-10-CM | POA: Diagnosis present

## 2017-06-09 DIAGNOSIS — I340 Nonrheumatic mitral (valve) insufficiency: Secondary | ICD-10-CM | POA: Diagnosis not present

## 2017-06-09 DIAGNOSIS — Z681 Body mass index (BMI) 19 or less, adult: Secondary | ICD-10-CM | POA: Diagnosis not present

## 2017-06-09 DIAGNOSIS — E7800 Pure hypercholesterolemia, unspecified: Secondary | ICD-10-CM | POA: Diagnosis present

## 2017-06-09 DIAGNOSIS — Z952 Presence of prosthetic heart valve: Secondary | ICD-10-CM

## 2017-06-09 DIAGNOSIS — J440 Chronic obstructive pulmonary disease with acute lower respiratory infection: Secondary | ICD-10-CM | POA: Diagnosis present

## 2017-06-09 DIAGNOSIS — Z7901 Long term (current) use of anticoagulants: Secondary | ICD-10-CM

## 2017-06-09 DIAGNOSIS — I361 Nonrheumatic tricuspid (valve) insufficiency: Secondary | ICD-10-CM | POA: Diagnosis not present

## 2017-06-09 LAB — CBC WITH DIFFERENTIAL/PLATELET
BASOS ABS: 0 10*3/uL (ref 0.0–0.1)
BASOS PCT: 0 %
EOS ABS: 0.2 10*3/uL (ref 0.0–0.7)
Eosinophils Relative: 1 %
HCT: 38.9 % (ref 36.0–46.0)
Hemoglobin: 11.9 g/dL — ABNORMAL LOW (ref 12.0–15.0)
LYMPHS ABS: 10.1 10*3/uL — AB (ref 0.7–4.0)
Lymphocytes Relative: 58 %
MCH: 30.5 pg (ref 26.0–34.0)
MCHC: 30.6 g/dL (ref 30.0–36.0)
MCV: 99.7 fL (ref 78.0–100.0)
MONO ABS: 0.5 10*3/uL (ref 0.1–1.0)
Monocytes Relative: 3 %
NEUTROS ABS: 6.6 10*3/uL (ref 1.7–7.7)
Neutrophils Relative %: 38 %
PLATELETS: 339 10*3/uL (ref 150–400)
RBC: 3.9 MIL/uL (ref 3.87–5.11)
RDW: 15.6 % — AB (ref 11.5–15.5)
WBC: 17.4 10*3/uL — ABNORMAL HIGH (ref 4.0–10.5)

## 2017-06-09 LAB — COMPREHENSIVE METABOLIC PANEL
ALBUMIN: 3.3 g/dL — AB (ref 3.5–5.0)
ALK PHOS: 154 U/L — AB (ref 38–126)
ALT: 79 U/L — ABNORMAL HIGH (ref 14–54)
AST: 138 U/L — ABNORMAL HIGH (ref 15–41)
Anion gap: 12 (ref 5–15)
BUN: 17 mg/dL (ref 6–20)
CALCIUM: 8.9 mg/dL (ref 8.9–10.3)
CHLORIDE: 112 mmol/L — AB (ref 101–111)
CO2: 18 mmol/L — AB (ref 22–32)
Creatinine, Ser: 0.81 mg/dL (ref 0.44–1.00)
GFR calc non Af Amer: >60 mL/min (ref >60)
Glucose, Bld: 259 mg/dL — ABNORMAL HIGH (ref 65–99)
Potassium: 3.9 mmol/L (ref 3.5–5.1)
SODIUM: 142 mmol/L (ref 135–145)
Total Bilirubin: 0.7 mg/dL (ref 0.3–1.2)
Total Protein: 6.7 g/dL (ref 6.5–8.1)

## 2017-06-09 LAB — I-STAT ARTERIAL BLOOD GAS, ED
ACID-BASE DEFICIT: 10 mmol/L — AB (ref 0.0–2.0)
BICARBONATE: 19.1 mmol/L — AB (ref 20.0–28.0)
O2 Saturation: 98 %
TCO2: 21 mmol/L — ABNORMAL LOW (ref 22–32)
pCO2 arterial: 52 mmHg — ABNORMAL HIGH (ref 32.0–48.0)
pH, Arterial: 7.172 — CL (ref 7.350–7.450)
pO2, Arterial: 138 mmHg — ABNORMAL HIGH (ref 83.0–108.0)

## 2017-06-09 LAB — I-STAT CG4 LACTIC ACID, ED: Lactic Acid, Venous: 2.61 mmol/L (ref 0.5–1.9)

## 2017-06-09 LAB — BRAIN NATRIURETIC PEPTIDE: B Natriuretic Peptide: 626.8 pg/mL — ABNORMAL HIGH (ref 0.0–100.0)

## 2017-06-09 LAB — TROPONIN I

## 2017-06-09 LAB — MAGNESIUM: MAGNESIUM: 1.8 mg/dL (ref 1.7–2.4)

## 2017-06-09 MED ORDER — PIPERACILLIN-TAZOBACTAM 3.375 G IVPB 30 MIN
3.3750 g | Freq: Once | INTRAVENOUS | Status: AC
  Administered 2017-06-09: 3.375 g via INTRAVENOUS
  Filled 2017-06-09: qty 50

## 2017-06-09 MED ORDER — PIPERACILLIN-TAZOBACTAM 3.375 G IVPB
3.3750 g | Freq: Three times a day (TID) | INTRAVENOUS | Status: DC
Start: 2017-06-10 — End: 2017-06-11
  Administered 2017-06-10 – 2017-06-11 (×4): 3.375 g via INTRAVENOUS
  Filled 2017-06-09 (×6): qty 50

## 2017-06-09 MED ORDER — LORAZEPAM 2 MG/ML IJ SOLN
0.5000 mg | Freq: Once | INTRAMUSCULAR | Status: AC
  Administered 2017-06-09: 0.5 mg via INTRAVENOUS
  Filled 2017-06-09: qty 1

## 2017-06-09 MED ORDER — VANCOMYCIN HCL IN DEXTROSE 1-5 GM/200ML-% IV SOLN
1000.0000 mg | Freq: Once | INTRAVENOUS | Status: AC
  Administered 2017-06-09: 1000 mg via INTRAVENOUS
  Filled 2017-06-09: qty 200

## 2017-06-09 MED ORDER — METHYLPREDNISOLONE SODIUM SUCC 125 MG IJ SOLR
125.0000 mg | Freq: Once | INTRAMUSCULAR | Status: AC
  Administered 2017-06-09: 125 mg via INTRAVENOUS
  Filled 2017-06-09: qty 2

## 2017-06-09 MED ORDER — VANCOMYCIN HCL IN DEXTROSE 750-5 MG/150ML-% IV SOLN
750.0000 mg | INTRAVENOUS | Status: DC
  Administered 2017-06-10: 750 mg via INTRAVENOUS
  Filled 2017-06-09 (×2): qty 150

## 2017-06-09 MED ORDER — LEVALBUTEROL HCL 0.63 MG/3ML IN NEBU
0.6300 mg | INHALATION_SOLUTION | Freq: Once | RESPIRATORY_TRACT | Status: DC
  Filled 2017-06-09: qty 3

## 2017-06-09 MED ORDER — PIPERACILLIN-TAZOBACTAM 3.375 G IVPB: 3.3750 g | Freq: Three times a day (TID) | INTRAVENOUS | Status: DC

## 2017-06-09 NOTE — ED Notes (Signed)
Son asked this RN about pt's DNR status, asking "if she stops breathing right now is there anything that you would do?" Explained to son that based on DNR status, invasive measures would not be taken to resuscitate pt. Son stated "well I think that she would want the breathing tube put in". Pt awake and alert at this time. This RN asked pt "Ma'am, if you were to stop breathing on your own at this point would you want [place] to put in a breathing tube and put you on a ventilator?". Pt states "Oh god no, please don't do that to me". Son present in room for conversation. Conversation communicated to MD [employer]. Pt remains DNR per her wishes.

## 2017-06-09 NOTE — ED Notes (Signed)
Pt is resting comfortably in stretcher at this time. Appears much more relaxed post ativan administration. Pt reports that she feels better, RR remains 22-28 on BiPAP. Abx completed per order. Warm blankets applied and comfort ensured. Denies any needs at this time. Family remains at bedside

## 2017-06-09 NOTE — ED Notes (Signed)
Date and time results received: 06/09/17 2038 (use smartphrase ".now" to insert current time)  Test: Lactic Acid Critical Value: 2.61  Name of Provider Notified: Einar Gip MD  Orders Received? Or Actions Taken?: no new orders at this time

## 2017-06-09 NOTE — H&P (Signed)
History and Physical    Angela Hines VEL:[id number] DOB: June 15, 1932 DOA: 06/09/2017  PCP: Kelton Pillar, MD   Patient coming from: Assisted living facility    Chief Complaint: Shortness of breath  HPI: Angela Hines is a 84 y.o. female with medical history significant of bioprosthetic aortic valve replacement, paroxysmal atrial fibrillation on rivaroxaban status post TEE/DC cardioversion in 2016 with loop placed for syncope, diastolic congestive heart failure, COPD, hyperlipidemia, type 2 diabetes on oral medication who comes in in respiratory distress.  Unable to obtain history as patient is currently on BiPAP and quite sleepy as Ativan was given in the ED.  Per review of the chart patient began to have shortness of breath starting today which then triggered her anxiety which worsened her shortness of breath.  She reportedly trialed multiple doses of her albuterol inhaler without much improvement.  EMS came and gave her an albuterol nebulizer as well as a dose of metoprolol for atrial fibrillation with RVR.  Cannot participate in review of systems   ED Course: In the ED patient was noted to be in respiratory distress with heart rate between 102-120.  Patient was initially on facemask and transition to BiPAP due to respiratory distress and she tolerated this well.  PH was 717, PCO2 was 52, white blood cell count was 17.4 and lactic acid was 2.61.  Chest x-ray showed asymmetric infiltrates concerning for infection versus atypical pulmonary edema.  BNP was in the 600s.  Troponin was negative LFTs were mildly elevated as well.  Review of Systems: Unable to participate due to patient condition.   Past Medical History:  Diagnosis Date  . Atrial fibrillation ([place])   . CHF (congestive heart failure) ([place])   . COPD (chronic obstructive pulmonary disease) ([place])   . Diabetes mellitus without complication ([place])   . High cholesterol   . Hypertension     Past Surgical History:  Procedure  Laterality Date  . CARDIAC PACEMAKER PLACEMENT  07/30/2014  . 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 CHOLECYSTECTOMY    . EXCISIONAL HEMORRHOIDECTOMY       reports that  has never smoked. she has never used smokeless tobacco. She reports that she does not drink alcohol or use drugs.  Allergies  Allergen Reactions  . Clonidine     "head swelling"  . Captopril Swelling  . Codeine Nausea And Vomiting    GI Intolerance   . Ceclor [Cefaclor] Rash    Family History  Problem Relation Age of Onset  . Emphysema Father   . Hypertension Sister   . Lung disease Sister   . Hypertension Brother   . Hypertension Brother   . Stroke Brother   . Other Son        one spleen removed, one with chronic pain from MVA  . Diabetes Son   . Multiple myeloma Son   . Heart disease Son   . Heart attack Son      Prior to Admission medications   Medication Sig Start Date End Date Taking? Authorizing Provider  albuterol (PROVENTIL HFA;VENTOLIN HFA) 108 (90 Base) MCG/ACT inhaler Inhale 2 puffs into the lungs every 6 (six) hours as needed for shortness of breath.    [provider]  amiodarone (PACERONE) 200 MG tablet Take 1 tablet (200 mg total) by mouth daily. 06/08/17   Sherran Needs, NP  atorvastatin (LIPITOR) 20 MG tablet Take 20 mg by mouth every evening. 12/01/16   [provider]  bismuth subsalicylate (PEPTO BISMOL) 262 MG/15ML suspension Take 30 mLs by mouth every 6 (six) hours as needed for indigestion.    [provider]  diphenhydramine-acetaminophen (TYLENOL PM) 25-500 MG TABS tablet Take 3 tablets by mouth at bedtime as needed (sleep).    [provider]  furosemide (LASIX) 20 MG tablet Take 20 mg by mouth daily. 07/17/16   [provider]  gabapentin (NEURONTIN) 300 MG capsule Take 600 mg by mouth at bedtime.  07/17/16   [provider]  loratadine (CLARITIN) 10 MG tablet Take 10 mg by mouth daily as needed for  allergies.    [provider]  LORazepam (ATIVAN) 0.5 MG tablet Take 0.5 mg by mouth at bedtime.  10/26/16   [provider]  magnesium oxide (MAG-OX) 400 (241.3 Mg) MG tablet Take 1 tablet (400 mg total) by mouth daily. 05/24/17   Nita Sells, MD  metFORMIN (GLUCOPHAGE) 1000 MG tablet Take 1,000 mg by mouth daily. 12/01/16   [provider]  metoprolol tartrate (LOPRESSOR) 50 MG tablet Take 1 tablet (50 mg total) by mouth 2 (two) times daily. 05/24/17   Nita Sells, MD  omeprazole (PRILOSEC) 20 MG capsule Take 20 mg by mouth daily.     [provider]  potassium chloride (K-DUR,KLOR-CON) 10 MEQ tablet Take 10 mEq by mouth daily. 04/14/16   [provider]  Rivaroxaban (XARELTO) 15 MG TABS tablet Take 15 mg by mouth daily with supper. 07/17/16   [provider]  zolpidem (AMBIEN) 5 MG tablet Take 1 tablet (5 mg total) by mouth at bedtime as needed for sleep. Patient taking differently: Take 5 mg by mouth at bedtime.  05/24/17   Nita Sells, MD    Physical Exam: Vitals:   06/09/17 1844 06/09/17 1915 06/09/17 1923 06/09/17 2019  BP:  (!) 168/101  122/84  Pulse: (!) 115 (!) 118 (!) 102 (!) 107  Resp: (!) 30 (!) 28 (!) 31 (!) 24  SpO2: 100% 99% 99% 96%  Weight:      Height:        Constitutional: NAD, calm, comfortable Vitals:   06/09/17 1844 06/09/17 1915 06/09/17 1923 06/09/17 2019  BP:  (!) 168/101  122/84  Pulse: (!) 115 (!) 118 (!) 102 (!) 107  Resp: (!) 30 (!) 28 (!) 31 (!) 24  SpO2: 100% 99% 99% 96%  Weight:      Height:       Eyes: Anicteric sclera ENMT: Dry mucous membranes, BiPAP in place Neck: normal, supple, Respiratory: Scattered rhonchi, diffuse inspiratory and expiratory wheezes in anterior and posterior lung fields, no crackles significant the increased work of breathing, Cardiovascular: Tachycardia, irregularly irregular, distant heart sounds Abdomen: no tenderness, no masses palpated. No  hepatosplenomegaly. Bowel sounds positive.  Musculoskeletal: No lower extremity edema,.  Skin: no rashes visible skin Neurologic: BP but arousable, moves all extremities Psychiatric: Normal judgment and insight. Alert and oriented x 3. Normal mood.     Labs on Admission: I have personally reviewed following labs and imaging studies  CBC: Recent Labs  Lab 06/07/17 1120 06/09/17 1855  WBC 6.6 17.4*  NEUTROABS  --  6.6  HGB 11.0* 11.9*  HCT 35.5* 38.9  MCV 96.5 99.7  PLT 278 702   Basic Metabolic Panel: Recent Labs  Lab 06/07/17 1120 06/09/17 1855  NA 142 142  K 2.9* 3.9  CL 108 112*  CO2 24 18*  GLUCOSE 89 259*  BUN 13 17  CREATININE 0.80 0.81  CALCIUM 9.4 8.9  MG  --  1.8   GFR: Estimated Creatinine Clearance: 43.7 mL/min (by C-G formula based on SCr of 0.81 mg/dL). Liver Function Tests: Recent Labs  Lab 06/09/17 1855  AST 138*  ALT 79*  ALKPHOS 154*  BILITOT 0.7  PROT 6.7  ALBUMIN 3.3*   No results for input(s): LIPASE, AMYLASE in the last 168 hours. No results for input(s): AMMONIA in the last 168 hours. Coagulation Profile: No results for input(s): INR, PROTIME in the last 168 hours. Cardiac Enzymes: Recent Labs  Lab 06/09/17 1855  TROPONINI <0.03   BNP (last 3 results) No results for input(s): PROBNP in the last 8760 hours. HbA1C: No results for input(s): HGBA1C in the last 72 hours. CBG: No results for input(s): GLUCAP in the last 168 hours. Lipid Profile: No results for input(s): CHOL, HDL, LDLCALC, TRIG, CHOLHDL, LDLDIRECT in the last 72 hours. Thyroid Function Tests: No results for input(s): TSH, T4TOTAL, FREET4, T3FREE, THYROIDAB in the last 72 hours. Anemia Panel: No results for input(s): VITAMINB12, FOLATE, FERRITIN, TIBC, IRON, RETICCTPCT in the last 72 hours. Urine analysis:    Component Value Date/Time   COLORURINE STRAW (A) 05/15/2017 2258   APPEARANCEUR CLEAR 05/15/2017 2258   LABSPEC 1.005 05/15/2017 2258   PHURINE 6.0  05/15/2017 2258   GLUCOSEU NEGATIVE 05/15/2017 2258   HGBUR NEGATIVE 05/15/2017 2258   BILIRUBINUR NEGATIVE 05/15/2017 2258   KETONESUR NEGATIVE 05/15/2017 2258   PROTEINUR NEGATIVE 05/15/2017 2258   NITRITE NEGATIVE 05/15/2017 2258   LEUKOCYTESUR SMALL (A) 05/15/2017 2258    Radiological Exams on Admission: Dg Chest Port 1 View  Result Date: 06/09/2017 CLINICAL DATA:  Shortness of breath and difficulty breathing. EXAM: PORTABLE CHEST 1 VIEW COMPARISON:  May 17, 2017 FINDINGS: No pneumothorax. Increasing hazy opacity in the right mid lower lung. Minimal increased density in the left perihilar region. The cardiomediastinal silhouette is stable. No other acute abnormalities. IMPRESSION: Increasing opacities in the lungs, right greater than left may represent developing multifocal infiltrate versus asymmetric edema. Recommend clinical correlation. Electronically Signed   By: Dorise Bullion III M.D   On: 06/09/2017 19:17    EKG: Independently reviewed.  Atrial fibrillation with RVR, no acute ST segment changes  Assessment/Plan Active Problems:   Atrial fibrillation with RVR (HCC)   COPD with acute exacerbation (HCC)   Diabetes mellitus (HCC)   Generalized anxiety disorder   Hypercholesterolemia   Hypertension   AF (paroxysmal atrial fibrillation) (HCC)   S/P AVR (aortic valve replacement)   Acute and chronic respiratory failure with hypoxia (HCC)   Healthcare-associated pneumonia    ##) Acute on chronic hypercarbic respiratory failure: Suspect that her respiratory failure is multifactorial.  She was recently hospitalized for what appears to be hospital-acquired pneumonia approximately 3 weeks ago.  She also has diffuse wheezing on exam.  With her chest x-ray showing patchy opacities and a BNP that was lower than prior admission suspect this is most likely pneumonia possibly H Superimposed on COPD exacerbation. -Continue BiPAP -Vancomycin and Zosyn -Patient does not want to be  intubated, confirmed this with the patient next line-we will hold on diuresis at this time -IV methylprednisolone 60 mg every 6 - Levalbuterol and ipratropium every 4 hours and as needed  ##) Paroxysmal atrial fibrillation with rapid ventricular response: likely exacerbated by both beta agonists and patient's illness. -Continue metoprolol tartrate 50 mg twice daily -Continue rivaroxaban 15 mg daily -Continue amiodarone 200 mg daily  ##) COPD: -Per above  ##) Hyperlipidemia: -  Continue atorvastatin 20 mg  ##) Diastolic heart failure: Chronic, does not appear to be in exacerbation - Hold furosemide 20 mg while being treated for sepsis  ##) Type 2 diabetes on oral medication: -Hold metformin -Sliding scale insulin and before meals at bedtime  ##) Psych/pain: -Continue lorazepam 0.5 mg at at bedtime -Continue gabapentin 600 mg nightly -Hold zolpidem 5 mg nightly  Fluids: None currently Electrolytes: Monitor and stable Nutrition: N.p.o. on BiPAP  Prophylaxis: On rivaroxaban  Disposition: Pending improvement in respiratory status  DO NOT RESUSCITATE     Cristy Folks MD [facility]  If 7PM-7AM, please contact night-coverage [email or web address] Password [facility]  06/09/2017, 9:03 PM

## 2017-06-09 NOTE — ED Provider Notes (Signed)
[facility] EMERGENCY DEPARTMENT Provider Note   CSN: [id number] Arrival date & time: 06/09/17  1811     History   Chief Complaint Chief Complaint  Patient presents with  . Shortness of Breath    HPI Wandra Babin is a 84 y.o. female.  HPI Patient presents with worsening shortness of breath starting today.  Unable to continue to contribute history due to respiratory distress.  Level 5 caveat.  Per son patient became upset and shortness of breath worsened.  She had increased work of breathing.  Son thinks she may have used multiple doses of her albuterol inhaler.  Given albuterol neb in route by EMS.  Patient was also given dose of metoprolol due to atrial fibrillation with RVR. Past Medical History:  Diagnosis Date  . Atrial fibrillation ([place])   . CHF (congestive heart failure) ([place])   . COPD (chronic obstructive pulmonary disease) ([place])   . Diabetes mellitus without complication ([place])   . High cholesterol   . Hypertension     Patient Active Problem List   Diagnosis Date Noted  . Acute on chronic diastolic CHF (congestive heart failure) ([place])   . Prolonged QT interval   . Sepsis ([place]) 05/15/2017  . Acute and chronic respiratory failure with hypoxia ([place]) 05/15/2017  . Healthcare-associated pneumonia 05/15/2017  . Community acquired pneumonia 05/15/2017  . Acute respiratory failure with hypoxia ([place]) 05/15/2017  . COPD with acute exacerbation ([place]) 07/17/2016  . Diabetes mellitus ([place]) 07/17/2016  . Gastroesophageal reflux disease without esophagitis 07/17/2016  . Generalized anxiety disorder 07/17/2016  . Hypercholesterolemia 07/17/2016  . Hypertension 07/17/2016  . Status post placement of implantable loop recorder 07/17/2016  . AF (paroxysmal atrial fibrillation) ([place]) 07/17/2016  . Primary insomnia 07/17/2016  . S/P AVR (aortic valve replacement) 07/17/2016  . History of syncope 08/07/2014  . Atrial fibrillation with RVR ([place]) 07/29/2014     Past Surgical History:  Procedure Laterality Date  . CARDIAC PACEMAKER PLACEMENT  07/30/2014  . 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 CHOLECYSTECTOMY    . EXCISIONAL HEMORRHOIDECTOMY      OB History    No data available       Home Medications    Prior to Admission medications   Medication Sig Start Date End Date Taking? Authorizing Provider  albuterol (PROVENTIL HFA;VENTOLIN HFA) 108 (90 Base) MCG/ACT inhaler Inhale 2 puffs into the lungs every 6 (six) hours as needed for shortness of breath.    [provider]  amiodarone (PACERONE) 200 MG tablet Take 1 tablet (200 mg total) by mouth daily. 06/08/17   Sherran Needs, NP  atorvastatin (LIPITOR) 20 MG tablet Take 20 mg by mouth every evening. 12/01/16   [provider]  bismuth subsalicylate (PEPTO BISMOL) 262 MG/15ML suspension Take 30 mLs by mouth every 6 (six) hours as needed for indigestion.    [provider]  diphenhydramine-acetaminophen (TYLENOL PM) 25-500 MG TABS tablet Take 3 tablets by mouth at bedtime as needed (sleep).    [provider]  furosemide (LASIX) 20 MG tablet Take 20 mg by mouth daily. 07/17/16   [provider]  gabapentin (NEURONTIN) 300 MG capsule Take 600 mg by mouth at bedtime.  07/17/16   [provider]  loratadine (CLARITIN) 10 MG tablet Take 10 mg by mouth daily as needed for allergies.    [provider]  LORazepam (ATIVAN) 0.5 MG tablet Take 0.5 mg by mouth at bedtime.  10/26/16   [provider]  magnesium oxide (MAG-OX) 400 (241.3 Mg) MG tablet Take 1 tablet (400 mg total) by mouth daily. 05/24/17   Nita Sells, MD  metFORMIN (GLUCOPHAGE) 1000 MG tablet Take 1,000 mg by mouth daily. 12/01/16   [provider]  metoprolol tartrate (LOPRESSOR) 50 MG tablet Take 1 tablet (50 mg total) by mouth 2 (two) times daily. 05/24/17   Nita Sells, MD  omeprazole (PRILOSEC) 20 MG capsule Take 20 mg by  mouth daily.     [provider]  potassium chloride (K-DUR,KLOR-CON) 10 MEQ tablet Take 10 mEq by mouth daily. 04/14/16   [provider]  Rivaroxaban (XARELTO) 15 MG TABS tablet Take 15 mg by mouth daily with supper. 07/17/16   [provider]  zolpidem (AMBIEN) 5 MG tablet Take 1 tablet (5 mg total) by mouth at bedtime as needed for sleep. Patient taking differently: Take 5 mg by mouth at bedtime.  05/24/17   Nita Sells, MD    Family History Family History  Problem Relation Age of Onset  . Emphysema Father   . Hypertension Sister   . Lung disease Sister   . Hypertension Brother   . Hypertension Brother   . Stroke Brother   . Other Son        one spleen removed, one with chronic pain from MVA  . Diabetes Son   . Multiple myeloma Son   . Heart disease Son   . Heart attack Son     Social History Social History   Tobacco Use  . Smoking status: Never Smoker  . Smokeless tobacco: Never Used  Substance Use Topics  . Alcohol use: No  . Drug use: No     Allergies   Clonidine; Captopril; Codeine; and Ceclor [cefaclor]   Review of Systems Review of Systems  Unable to perform ROS: Acuity of condition     Physical Exam Updated Vital Signs BP 120/89   Pulse (!) 116   Resp (!) 26   Ht _0  (1.702 m)   Wt 51.7 kg (114 lb)   SpO2 100%   BMI 17.85 kg/m   Physical Exam  Constitutional: She is oriented to person, place, and time. She appears well-developed and well-nourished. She appears distressed.  Frail-appearing.  HENT:  Head: Normocephalic and atraumatic.  Mouth/Throat: Oropharynx is clear and moist.  Eyes: EOM are normal. Pupils are equal, round, and reactive to light.  Neck: Normal range of motion. Neck supple.  Cardiovascular:  Tachycardia.  Irregularly irregular.  Pulmonary/Chest:  Increased work of breathing.  Use of accessory muscles.  Diminished breath sounds throughout.  Abdominal: Soft. Bowel sounds are normal.  There is no tenderness. There is no rebound and no guarding.  Musculoskeletal: Normal range of motion. She exhibits no edema or tenderness.  No lower extremity swelling or asymmetry.  Neurological: She is alert and oriented to person, place, and time.  Moving all extremities without focal deficit.  Sensation intact.  Skin: Skin is warm and dry. Capillary refill takes less than 2 seconds. No rash noted. No erythema.  Psychiatric: Her behavior is normal.  Anxious appearing  Nursing note and vitals reviewed.    ED Treatments / Results  Labs (all labs ordered are listed, but only abnormal results are displayed) Labs Reviewed  CBC WITH DIFFERENTIAL/PLATELET - Abnormal; Notable for the following components:      Result Value   WBC 17.4 (*)    Hemoglobin 11.9 (*)    RDW 15.6 (*)  Lymphs Abs 10.1 (*)    All other components within normal limits  COMPREHENSIVE METABOLIC PANEL - Abnormal; Notable for the following components:   Chloride 112 (*)    CO2 18 (*)    Glucose, Bld 259 (*)    Albumin 3.3 (*)    AST 138 (*)    ALT 79 (*)    Alkaline Phosphatase 154 (*)    All other components within normal limits  BRAIN NATRIURETIC PEPTIDE - Abnormal; Notable for the following components:   B Natriuretic Peptide 626.8 (*)    All other components within normal limits  I-STAT ARTERIAL BLOOD GAS, ED - Abnormal; Notable for the following components:   pH, Arterial 7.172 (*)    pCO2 arterial 52.0 (*)    pO2, Arterial 138.0 (*)    Bicarbonate 19.1 (*)    TCO2 21 (*)    Acid-base deficit 10.0 (*)    All other components within normal limits  I-STAT CG4 LACTIC ACID, ED - Abnormal; Notable for the following components:   Lactic Acid, Venous 2.61 (*)    All other components within normal limits  CULTURE, BLOOD (ROUTINE X 2)  CULTURE, BLOOD (ROUTINE X 2)  TROPONIN I  MAGNESIUM  LACTIC ACID, PLASMA    EKG  EKG Interpretation  Date/Time:  Saturday June 09 2017 18:24:39 EST Ventricular  Rate:  114 PR Interval:    QRS Duration: 115 QT Interval:  360 QTC Calculation: 496 R Axis:   -59 Text Interpretation:  Atrial flutter Ventricular premature complex Incomplete left bundle branch block LVH with secondary repolarization abnormality Anterior Q waves, possibly due to LVH Confirmed by Julianne Rice [phone]) on 06/09/2017 6:46:23 PM       Radiology Dg Chest Port 1 View  Result Date: 06/09/2017 CLINICAL DATA:  Shortness of breath and difficulty breathing. EXAM: PORTABLE CHEST 1 VIEW COMPARISON:  May 17, 2017 FINDINGS: No pneumothorax. Increasing hazy opacity in the right mid lower lung. Minimal increased density in the left perihilar region. The cardiomediastinal silhouette is stable. No other acute abnormalities. IMPRESSION: Increasing opacities in the lungs, right greater than left may represent developing multifocal infiltrate versus asymmetric edema. Recommend clinical correlation. Electronically Signed   By: Dorise Bullion III M.D   On: 06/09/2017 19:17    Procedures Procedures (including critical care time)  Medications Ordered in ED Medications  vancomycin (VANCOCIN) IVPB 750 mg/150 ml premix (not administered)  piperacillin-tazobactam (ZOSYN) IVPB 3.375 g (not administered)  methylPREDNISolone sodium succinate (SOLU-MEDROL) 125 mg/2 mL injection 125 mg (125 mg Intravenous Given 06/09/17 1856)  vancomycin (VANCOCIN) IVPB 1000 mg/200 mL premix (0 mg Intravenous Stopped 06/09/17 2108)  piperacillin-tazobactam (ZOSYN) IVPB 3.375 g (0 g Intravenous Stopped 06/09/17 2038)  LORazepam (ATIVAN) injection 0.5 mg (0.5 mg Intravenous Given 06/09/17 2007)   CRITICAL CARE Performed by: Julianne Rice Total critical care time: 45 minutes Critical care time was exclusive of separately billable procedures and treating other patients. Critical care was necessary to treat or prevent imminent or life-threatening deterioration. Critical care was time spent personally by me on the  following activities: development of treatment plan with patient and/or surrogate as well as nursing, discussions with consultants, evaluation of patient's response to treatment, examination of patient, obtaining history from patient or surrogate, ordering and performing treatments and interventions, ordering and review of laboratory studies, ordering and review of radiographic studies, pulse oximetry and re-evaluation of patient's condition.  Initial Impression / Assessment and Plan / ED Course  I have reviewed the triage  vital signs and the nursing notes.  Pertinent labs & imaging results that were available during my care of the patient were reviewed by me and considered in my medical decision making (see chart for details).     Patient placed on BiPAP.  Chest x-ray with evidence of multifocal pneumonia worse on the right.  Started on broad-spectrum antibiotics.  Patient given small dose of Ativan for anxiety.  Appears more comfortable on BiPAP.  Discussed with hospitalist who will see patient in the emergency department.  Final Clinical Impressions(s) / ED Diagnoses   Final diagnoses:  COPD exacerbation ([place])  HCAP (healthcare-associated pneumonia)    ED Discharge Orders    None       Julianne Rice, MD 06/09/17 2145

## 2017-06-09 NOTE — Progress Notes (Signed)
Pharmacy Antibiotic Note  Angela Hines is a 84 y.o. female admitted on 06/09/2017 with sepsis.  Pharmacy has been consulted for vancomycin dosing.  Presented from [facility] with SOB and difficulty breathing. WBC is elevated at 17.4 - started on steroids. LA is 2.61. She received 1 dose of zosyn and vancomycin in ED. Renal function is stable (Scr 0.8, adjustedCrCl ~35 mL/min).  Plan: Vancomycin 750 mg IV every 24 hours.  Goal trough 15-20 mcg/mL.  Monitor renal function, clinical pic, cx results, and VT as needed  Height: 5\' 7"  (170.2 cm) Weight: 114 lb (51.7 kg) IBW/kg (Calculated) : 61.6  No data recorded.  Recent Labs  Lab 06/07/17 1120 06/09/17 1855 06/09/17 2030  WBC 6.6 17.4*  --   CREATININE 0.80 0.81  --   LATICACIDVEN  --   --  2.61*    Estimated Creatinine Clearance: 43.7 mL/min (by C-G formula based on SCr of 0.81 mg/dL).    Allergies  Allergen Reactions  . Clonidine     "head swelling"  . Captopril Swelling  . Codeine Nausea And Vomiting    GI Intolerance   . Ceclor [Cefaclor] Rash    Antimicrobials this admission: Vancomycin 2/16 >>  Zosyn x1   Dose adjustments this admission: N/A  Microbiology results: 2/16 BCx: sent  Thank you for allowing pharmacy to be a part of this patient's care.  Doylene Canard, PharmD Clinical Pharmacist  Pager: [phone] Clinical Phone for 06/09/2017 until 3:30pm: [phone] If after 3:30pm, please call main pharmacy at [phone] 06/09/2017 9:24 PM

## 2017-06-09 NOTE — ED Notes (Signed)
Lactic acid results 2.61 given to RN Kaitlynn to given to Dr Lita Mains.

## 2017-06-09 NOTE — ED Triage Notes (Signed)
Per ems pt was at home in [facility] and was having SOB and difficulty breathing. Upon arrival pt's stats were in the 80's was placed on a non rebreather and a breathing treatment 5 1/2 duo neb and 125 solumedrol. cbg 268, 200/117. When ems arrived heart rate was 150 to 180 and was given 5mg  of metoprolol . Heart rates then went into the 100 to 120's.

## 2017-06-10 ENCOUNTER — Other Ambulatory Visit: Payer: Self-pay

## 2017-06-10 DIAGNOSIS — J9621 Acute and chronic respiratory failure with hypoxia: Secondary | ICD-10-CM

## 2017-06-10 LAB — CBC
HCT: 33.4 % — ABNORMAL LOW (ref 36.0–46.0)
Hemoglobin: 10.4 g/dL — ABNORMAL LOW (ref 12.0–15.0)
MCH: 30.1 pg (ref 26.0–34.0)
MCHC: 31.1 g/dL (ref 30.0–36.0)
MCV: 96.5 fL (ref 78.0–100.0)
Platelets: 252 10*3/uL (ref 150–400)
RBC: 3.46 MIL/uL — ABNORMAL LOW (ref 3.87–5.11)
RDW: 15.2 % (ref 11.5–15.5)
WBC: 8.4 10*3/uL (ref 4.0–10.5)

## 2017-06-10 LAB — GLUCOSE, CAPILLARY
Glucose-Capillary: 183 mg/dL — ABNORMAL HIGH (ref 65–99)
Glucose-Capillary: 136 mg/dL — ABNORMAL HIGH (ref 65–99)

## 2017-06-10 LAB — MAGNESIUM: Magnesium: 1.5 mg/dL — ABNORMAL LOW (ref 1.7–2.4)

## 2017-06-10 LAB — COMPREHENSIVE METABOLIC PANEL
ALT: 90 U/L — ABNORMAL HIGH (ref 14–54)
AST: 95 U/L — ABNORMAL HIGH (ref 15–41)
Albumin: 3.2 g/dL — ABNORMAL LOW (ref 3.5–5.0)
BUN: 17 mg/dL (ref 6–20)
Chloride: 108 mmol/L (ref 101–111)
Creatinine, Ser: 0.7 mg/dL (ref 0.44–1.00)
GFR calc non Af Amer: >60 mL/min (ref >60)
Glucose, Bld: 174 mg/dL — ABNORMAL HIGH (ref 65–99)
Total Bilirubin: 0.8 mg/dL (ref 0.3–1.2)
Total Protein: 6.5 g/dL (ref 6.5–8.1)

## 2017-06-10 LAB — COMPREHENSIVE METABOLIC PANEL WITH GFR
Alkaline Phosphatase: 137 U/L — ABNORMAL HIGH (ref 38–126)
Anion gap: 14 (ref 5–15)
CO2: 18 mmol/L — ABNORMAL LOW (ref 22–32)
Calcium: 8.8 mg/dL — ABNORMAL LOW (ref 8.9–10.3)
GFR calc Af Amer: >60 mL/min (ref >60)
Potassium: 3.6 mmol/L (ref 3.5–5.1)
Sodium: 140 mmol/L (ref 135–145)

## 2017-06-10 LAB — CBG MONITORING, ED
GLUCOSE-CAPILLARY: 195 mg/dL — AB (ref 65–99)
GLUCOSE-CAPILLARY: 160 mg/dL — AB (ref 65–99)

## 2017-06-10 LAB — MRSA PCR SCREENING: MRSA by PCR: NEGATIVE

## 2017-06-10 LAB — INFLUENZA PANEL BY PCR (TYPE A & B)
Influenza A By PCR: NEGATIVE
Influenza B By PCR: NEGATIVE

## 2017-06-10 LAB — LACTIC ACID, PLASMA: Lactic Acid, Venous: 1.9 mmol/L (ref 0.5–1.9)

## 2017-06-10 MED ORDER — BISMUTH SUBSALICYLATE 262 MG/15ML PO SUSP: 30.0000 mL | Freq: Four times a day (QID) | ORAL | Status: DC | PRN

## 2017-06-10 MED ORDER — AMIODARONE HCL 200 MG PO TABS
200.0000 mg | ORAL_TABLET | Freq: Two times a day (BID) | ORAL | Status: DC
  Administered 2017-06-10 – 2017-06-12 (×4): 200 mg via ORAL
  Filled 2017-06-10 (×4): qty 1

## 2017-06-10 MED ORDER — ACETAMINOPHEN 650 MG RE SUPP: 650.0000 mg | Freq: Four times a day (QID) | RECTAL | Status: DC | PRN

## 2017-06-10 MED ORDER — MAGNESIUM SULFATE 2 GM/50ML IV SOLN
2.0000 g | Freq: Once | INTRAVENOUS | Status: AC
Start: 2017-06-10 — End: 2017-06-10
  Administered 2017-06-10: 2 g via INTRAVENOUS
  Filled 2017-06-10: qty 50

## 2017-06-10 MED ORDER — LORATADINE 10 MG PO TABS: 10.0000 mg | ORAL_TABLET | Freq: Every day | ORAL | Status: DC | PRN

## 2017-06-10 MED ORDER — LORAZEPAM 0.5 MG PO TABS
0.5000 mg | ORAL_TABLET | Freq: Every day | ORAL | Status: DC
Start: 2017-06-10 — End: 2017-06-12
  Administered 2017-06-11: 0.5 mg via ORAL
  Filled 2017-06-10: qty 1

## 2017-06-10 MED ORDER — INSULIN ASPART 100 UNIT/ML ~~LOC~~ SOLN
0.0000 [IU] | Freq: Three times a day (TID) | SUBCUTANEOUS | Status: DC
  Administered 2017-06-10 – 2017-06-11 (×4): 3 [IU] via SUBCUTANEOUS
  Administered 2017-06-11: 5 [IU] via SUBCUTANEOUS
  Administered 2017-06-12: 2 [IU] via SUBCUTANEOUS
  Filled 2017-06-10: qty 1

## 2017-06-10 MED ORDER — DIPHENHYDRAMINE-APAP (SLEEP) 25-500 MG PO TABS: 3.0000 | ORAL_TABLET | Freq: Every evening | ORAL | Status: DC | PRN

## 2017-06-10 MED ORDER — HYDROXYZINE HCL 10 MG PO TABS
10.0000 mg | ORAL_TABLET | Freq: Three times a day (TID) | ORAL | Status: DC | PRN
  Administered 2017-06-10 – 2017-06-11 (×2): 10 mg via ORAL
  Filled 2017-06-10 (×3): qty 1

## 2017-06-10 MED ORDER — HYDROCODONE-ACETAMINOPHEN 5-325 MG PO TABS: 1.0000 | ORAL_TABLET | ORAL | Status: DC | PRN

## 2017-06-10 MED ORDER — GABAPENTIN 300 MG PO CAPS: 600.0000 mg | ORAL_CAPSULE | Freq: Every day | ORAL | Status: DC

## 2017-06-10 MED ORDER — INSULIN ASPART 100 UNIT/ML ~~LOC~~ SOLN
0.0000 [IU] | Freq: Three times a day (TID) | SUBCUTANEOUS | Status: DC
  Administered 2017-06-10: 2 [IU] via SUBCUTANEOUS
  Filled 2017-06-10: qty 1

## 2017-06-10 MED ORDER — ACETAMINOPHEN 325 MG PO TABS: 650.0000 mg | ORAL_TABLET | Freq: Four times a day (QID) | ORAL | Status: DC | PRN

## 2017-06-10 MED ORDER — METHYLPREDNISOLONE SODIUM SUCC 125 MG IJ SOLR
60.0000 mg | Freq: Four times a day (QID) | INTRAMUSCULAR | Status: DC
  Administered 2017-06-10: 60 mg via INTRAVENOUS
  Filled 2017-06-10: qty 2

## 2017-06-10 MED ORDER — AMIODARONE HCL 200 MG PO TABS
200.0000 mg | ORAL_TABLET | Freq: Every day | ORAL | Status: DC
  Administered 2017-06-10: 200 mg via ORAL
  Filled 2017-06-10: qty 1

## 2017-06-10 MED ORDER — LORAZEPAM 0.5 MG PO TABS: 0.5000 mg | ORAL_TABLET | Freq: Every day | ORAL | Status: DC

## 2017-06-10 MED ORDER — INSULIN ASPART 100 UNIT/ML ~~LOC~~ SOLN
0.0000 [IU] | Freq: Every day | SUBCUTANEOUS | Status: DC
  Administered 2017-06-11: 2 [IU] via SUBCUTANEOUS

## 2017-06-10 MED ORDER — ONDANSETRON HCL 4 MG/2ML IJ SOLN
4.0000 mg | Freq: Four times a day (QID) | INTRAMUSCULAR | Status: DC | PRN
  Administered 2017-06-11: 4 mg via INTRAVENOUS
  Filled 2017-06-10: qty 2

## 2017-06-10 MED ORDER — PREDNISONE 20 MG PO TABS: 40.0000 mg | ORAL_TABLET | Freq: Every day | ORAL | Status: DC

## 2017-06-10 MED ORDER — ONDANSETRON HCL 4 MG PO TABS: 4.0000 mg | ORAL_TABLET | Freq: Four times a day (QID) | ORAL | Status: DC | PRN

## 2017-06-10 MED ORDER — POTASSIUM CHLORIDE CRYS ER 20 MEQ PO TBCR
40.0000 meq | EXTENDED_RELEASE_TABLET | Freq: Once | ORAL | Status: AC
Start: 2017-06-10 — End: 2017-06-10
  Administered 2017-06-10: 40 meq via ORAL
  Filled 2017-06-10: qty 2

## 2017-06-10 MED ORDER — GABAPENTIN 300 MG PO CAPS
600.0000 mg | ORAL_CAPSULE | Freq: Every day | ORAL | Status: DC
  Administered 2017-06-10 – 2017-06-11 (×2): 600 mg via ORAL
  Filled 2017-06-10 (×2): qty 2

## 2017-06-10 MED ORDER — POLYETHYLENE GLYCOL 3350 17 G PO PACK
17.0000 g | PACK | Freq: Every day | ORAL | Status: DC | PRN
  Administered 2017-06-11: 17 g via ORAL
  Filled 2017-06-10: qty 1

## 2017-06-10 MED ORDER — MAGNESIUM OXIDE 400 (241.3 MG) MG PO TABS: 400.0000 mg | ORAL_TABLET | Freq: Every day | ORAL | Status: DC

## 2017-06-10 MED ORDER — IPRATROPIUM BROMIDE 0.02 % IN SOLN
0.5000 mg | RESPIRATORY_TRACT | Status: DC
  Administered 2017-06-10: 0.5 mg via RESPIRATORY_TRACT
  Filled 2017-06-10: qty 2.5

## 2017-06-10 MED ORDER — ACETAMINOPHEN 500 MG PO TABS: 1000.0000 mg | ORAL_TABLET | Freq: Every evening | ORAL | Status: DC | PRN

## 2017-06-10 MED ORDER — ATORVASTATIN CALCIUM 10 MG PO TABS: 20.0000 mg | ORAL_TABLET | Freq: Every evening | ORAL | Status: DC

## 2017-06-10 MED ORDER — PHENOL 1.4 % MT LIQD
1.0000 | OROMUCOSAL | Status: DC | PRN
  Administered 2017-06-10: 1 via OROMUCOSAL
  Filled 2017-06-10: qty 177

## 2017-06-10 MED ORDER — IPRATROPIUM BROMIDE 0.02 % IN SOLN
0.5000 mg | RESPIRATORY_TRACT | Status: DC | PRN
  Administered 2017-06-11: 0.5 mg via RESPIRATORY_TRACT
  Filled 2017-06-10: qty 2.5

## 2017-06-10 MED ORDER — LEVALBUTEROL HCL 0.63 MG/3ML IN NEBU
0.6300 mg | INHALATION_SOLUTION | RESPIRATORY_TRACT | Status: DC
  Administered 2017-06-10 (×3): 0.63 mg via RESPIRATORY_TRACT
  Filled 2017-06-10 (×2): qty 3

## 2017-06-10 MED ORDER — RIVAROXABAN 15 MG PO TABS
15.0000 mg | ORAL_TABLET | Freq: Every day | ORAL | Status: DC
  Administered 2017-06-10 – 2017-06-11 (×2): 15 mg via ORAL
  Filled 2017-06-10 (×3): qty 1

## 2017-06-10 MED ORDER — DIPHENHYDRAMINE HCL 25 MG PO CAPS
50.0000 mg | ORAL_CAPSULE | Freq: Every evening | ORAL | Status: DC | PRN
Start: 2017-06-10 — End: 2017-06-10

## 2017-06-10 MED ORDER — IPRATROPIUM-ALBUTEROL 0.5-2.5 (3) MG/3ML IN SOLN
3.0000 mL | RESPIRATORY_TRACT | Status: DC
  Administered 2017-06-10: 3 mL via RESPIRATORY_TRACT
  Filled 2017-06-10: qty 3

## 2017-06-10 MED ORDER — LEVALBUTEROL HCL 0.63 MG/3ML IN NEBU
0.6300 mg | INHALATION_SOLUTION | RESPIRATORY_TRACT | Status: DC | PRN
  Administered 2017-06-11: 0.63 mg via RESPIRATORY_TRACT
  Filled 2017-06-10: qty 3

## 2017-06-10 MED ORDER — ZOLPIDEM TARTRATE 5 MG PO TABS
5.0000 mg | ORAL_TABLET | Freq: Every evening | ORAL | Status: DC | PRN
  Administered 2017-06-10 – 2017-06-11 (×2): 5 mg via ORAL
  Filled 2017-06-10 (×2): qty 1

## 2017-06-10 MED ORDER — BUPROPION HCL 75 MG PO TABS
75.0000 mg | ORAL_TABLET | Freq: Two times a day (BID) | ORAL | Status: DC
  Administered 2017-06-10 – 2017-06-12 (×5): 75 mg via ORAL
  Filled 2017-06-10 (×7): qty 1

## 2017-06-10 MED ORDER — PANTOPRAZOLE SODIUM 40 MG PO TBEC
40.0000 mg | DELAYED_RELEASE_TABLET | Freq: Every day | ORAL | Status: DC
  Administered 2017-06-10 – 2017-06-12 (×3): 40 mg via ORAL
  Filled 2017-06-10 (×3): qty 1

## 2017-06-10 MED ORDER — VANCOMYCIN HCL IN DEXTROSE 1-5 GM/200ML-% IV SOLN: 1000.0000 mg | Freq: Two times a day (BID) | INTRAVENOUS | Status: DC

## 2017-06-10 MED ORDER — METHYLPREDNISOLONE SODIUM SUCC 125 MG IJ SOLR
60.0000 mg | Freq: Every day | INTRAMUSCULAR | Status: DC
  Administered 2017-06-11: 60 mg via INTRAVENOUS
  Filled 2017-06-10 (×2): qty 2

## 2017-06-10 MED ORDER — METOPROLOL TARTRATE 50 MG PO TABS
50.0000 mg | ORAL_TABLET | Freq: Two times a day (BID) | ORAL | Status: DC
  Administered 2017-06-10 – 2017-06-12 (×6): 50 mg via ORAL
  Filled 2017-06-10 (×2): qty 2
  Filled 2017-06-10 (×4): qty 1

## 2017-06-10 NOTE — ED Notes (Signed)
Pt CBG 160, notified Martie Round [employer] banker, [employer])

## 2017-06-10 NOTE — Progress Notes (Signed)
Took pt. Off bipap for rest. Pt. Oxygenation much improved at this time and pt. Also wanted to come off the bipap. RN aware that if pt. Needs to go back on to call RT.

## 2017-06-10 NOTE — ED Notes (Signed)
Pt appears much more comfortable after Xopenex neb tx

## 2017-06-10 NOTE — ED Notes (Signed)
Respiratory trialing pt off BiPAP on 4L [place] at this time. Pt tolerating well

## 2017-06-10 NOTE — Progress Notes (Signed)
[facility] Progress Note  Patient: Angela Hines [id number]   PCP: Kelton Pillar, MD DOB: [birth year]   DOA: 06/09/2017   DOS: 06/10/2017   Date of Service: the patient was seen and examined on 06/10/2017  Subjective: Eating is better.  No nausea no abdominal pain no chest pain.  Brief hospital course: Pt. with PMH of AVR, A. fib, COPD, HLD, type II DM, pacemaker; admitted on 06/09/2017, presented with complaint of shortness of breath, was found to have acute hypercarbic respiratory failure. Currently further plan is continue current treatment.  Assessment and Plan: ##) Acute on chronic hypercarbic respiratory failure: Suspect that her respiratory failure is multifactorial.  She was recently hospitalized for what appears to be hospital-acquired pneumonia approximately 3 weeks ago.  She also has diffuse wheezing on exam.  With her chest x-ray showing patchy opacities and a BNP that was lower than prior admission suspect this is most likely pneumonia possibly H Superimposed on COPD exacerbation. As needed BiPAP -Vancomycin and Zosyn -IV methylprednisolone 60 mg every 6 - Levalbuterol and ipratropium every 4 hours and as needed  ##) Paroxysmal atrial fibrillation with rapid ventricular response: likely exacerbated by both beta agonists and patient's illness. -Continue metoprolol tartrate 50 mg twice daily -Continue rivaroxaban 15 mg daily -Continue amiodarone 200 mg twice daily per cardiology note.  ##) COPD: -Per above  ##) Hyperlipidemia: -Continue atorvastatin 20 mg  ##) Diastolic heart failure: Chronic, does not appear to be in exacerbation Resume furosemide tomorrow.  ##) Type 2 diabetes on oral medication: -Hold metformin -Sliding scale insulin and before meals at bedtime  ##) Psych/pain: -Continue lorazepam 0.5 mg at at bedtime -Continue gabapentin 600 mg nightly -Hold zolpidem 5 mg nightly -Appears to have significant anxiety and using lorazepam for that  for a while, suspect that the patient requires definitive treatment for her anxiety disorder and depression.  Given patient's history of QT prolongation patient will be started on Wellbutrin.   Diet:  DVT Prophylaxis: on therapeutic anticoagulation.  Advance goals of care discussion: DNR DNI  Family Communication: family was present at bedside, at the time of interview. The pt provided permission to discuss medical plan with the family. Opportunity was given to ask question and all questions were answered satisfactorily.   Disposition:  Discharge to home.  Consultants: none Procedures: nonoe  Antibiotics: Anti-infectives (From admission, onward)   Start     Dose/Rate Route Frequency Ordered Stop   06/10/17 2000  vancomycin (VANCOCIN) IVPB 750 mg/150 ml premix     750 mg 150 mL/hr over 60 Minutes Intravenous Every 24 hours 06/09/17 2124     06/10/17 0400  piperacillin-tazobactam (ZOSYN) IVPB 3.375 g     3.375 g 12.5 mL/hr over 240 Minutes Intravenous Every 8 hours 06/09/17 2131     06/10/17 0141  vancomycin (VANCOCIN) IVPB 1000 mg/200 mL premix  Status:  Discontinued     1,000 mg 200 mL/hr over 60 Minutes Intravenous Every 12 hours 06/10/17 0141 06/10/17 1108   06/09/17 2200  piperacillin-tazobactam (ZOSYN) IVPB 3.375 g  Status:  Discontinued     3.375 g 12.5 mL/hr over 240 Minutes Intravenous Every 8 hours 06/09/17 2130 06/09/17 2131   06/09/17 1945  vancomycin (VANCOCIN) IVPB 1000 mg/200 mL premix     1,000 mg 200 mL/hr over 60 Minutes Intravenous  Once 06/09/17 1932 06/09/17 2108   06/09/17 1945  piperacillin-tazobactam (ZOSYN) IVPB 3.375 g     3.375 g 100 mL/hr over 30 Minutes Intravenous  Once 06/09/17 1932 06/09/17  2038       Objective: Physical Exam: Vitals:   06/10/17 1200 06/10/17 1230 06/10/17 1306 06/10/17 1638  BP: 122/67 (!) 129/95 (!) 145/98 (!) 138/95  Pulse: 98 (!) 105    Resp: (!) 24 (!) 30    Temp:   98 F (36.7 C) 97.8 F (36.6 C)  TempSrc:   Oral  Oral  SpO2: 97% 97% 96% 99%  Weight:   51.1 kg (112 lb 9.6 oz)   Height:   5\' 7"  (1.702 m)     Intake/Output Summary (Last 24 hours) at 06/10/2017 1935 Last data filed at 06/10/2017 1600 Gross per 24 hour  Intake 300 ml  Output 300 ml  Net 0 ml   Filed Weights   06/09/17 1825 06/10/17 1306  Weight: 51.7 kg (114 lb) 51.1 kg (112 lb 9.6 oz)   General: Alert, Awake and Oriented to Time, Place and Person. Appear in mild distress, affect appropriate Eyes: PERRL, Conjunctiva normal ENT: Oral Mucosa clear moist. Neck: no JVD, no Abnormal Mass Or lumps Cardiovascular: S1 and S2 Present, no Murmur, Peripheral Pulses Present Respiratory: normal respiratory effort, Bilateral Air entry equal and Decreased, no use of accessory muscle, Clear to Auscultation, no Crackles, no wheezes Abdomen: Bowel Sound present, Soft and no tenderness, no hernia Skin: no redness, no Rash, no induration Extremities: no Pedal edema, no calf tenderness Neurologic: Grossly no focal neuro deficit. Bilaterally Equal motor strength  Data Reviewed: CBC: Recent Labs  Lab 06/07/17 1120 06/09/17 1855 06/10/17 0251  WBC 6.6 17.4* 8.4  NEUTROABS  --  6.6  --   HGB 11.0* 11.9* 10.4*  HCT 35.5* 38.9 33.4*  MCV 96.5 99.7 96.5  PLT 278 339 287   Basic Metabolic Panel: Recent Labs  Lab 06/07/17 1120 06/09/17 1855 06/10/17 0251  NA 142 142 140  K 2.9* 3.9 3.6  CL 108 112* 108  CO2 24 18* 18*  GLUCOSE 89 259* 174*  BUN 13 17 17   CREATININE 0.80 0.81 0.70  CALCIUM 9.4 8.9 8.8*  MG  --  1.8 1.5*    Liver Function Tests: Recent Labs  Lab 06/09/17 1855 06/10/17 0251  AST 138* 95*  ALT 79* 90*  ALKPHOS 154* 137*  BILITOT 0.7 0.8  PROT 6.7 6.5  ALBUMIN 3.3* 3.2*   No results for input(s): LIPASE, AMYLASE in the last 168 hours. No results for input(s): AMMONIA in the last 168 hours. Coagulation Profile: No results for input(s): INR, PROTIME in the last 168 hours. Cardiac Enzymes: Recent Labs  Lab  06/09/17 1855  TROPONINI <0.03   BNP (last 3 results) No results for input(s): PROBNP in the last 8760 hours. CBG: Recent Labs  Lab 06/10/17 0623 06/10/17 1129 06/10/17 1710  GLUCAP 195* 160* 183*   Studies: No results found.  Scheduled Meds: . amiodarone  200 mg Oral BID  . buPROPion  75 mg Oral BID  . gabapentin  600 mg Oral QHS  . insulin aspart  0-15 Units Subcutaneous TID WC  . insulin aspart  0-5 Units Subcutaneous QHS  . LORazepam  0.5 mg Oral QHS  . [START ON 06/11/2017] methylPREDNISolone (SOLU-MEDROL) injection  60 mg Intravenous Daily  . metoprolol tartrate  50 mg Oral BID  . pantoprazole  40 mg Oral Daily  . Rivaroxaban  15 mg Oral Q supper   Continuous Infusions: . piperacillin-tazobactam (ZOSYN)  IV Stopped (06/10/17 1538)  . vancomycin     PRN Meds: acetaminophen **OR** acetaminophen, HYDROcodone-acetaminophen, hydrOXYzine, ipratropium, levalbuterol,  loratadine, ondansetron **OR** ondansetron (ZOFRAN) IV, polyethylene glycol  Time spent: 35 minutes  Author: Berle Mull, MD [facility] Pager: [phone] 06/10/2017 7:35 PM  If 7PM-7AM, please contact night-coverage at [email or web address], password [facility]

## 2017-06-11 ENCOUNTER — Inpatient Hospital Stay (HOSPITAL_COMMUNITY): Payer: Medicare Other

## 2017-06-11 DIAGNOSIS — E43 Unspecified severe protein-calorie malnutrition: Secondary | ICD-10-CM

## 2017-06-11 DIAGNOSIS — I340 Nonrheumatic mitral (valve) insufficiency: Secondary | ICD-10-CM

## 2017-06-11 DIAGNOSIS — I361 Nonrheumatic tricuspid (valve) insufficiency: Secondary | ICD-10-CM

## 2017-06-11 LAB — COMPREHENSIVE METABOLIC PANEL
ALK PHOS: 115 U/L (ref 38–126)
ALT: 95 U/L — ABNORMAL HIGH (ref 14–54)
ANION GAP: 10 (ref 5–15)
AST: 94 U/L — ABNORMAL HIGH (ref 15–41)
Albumin: 3.1 g/dL — ABNORMAL LOW (ref 3.5–5.0)
BILIRUBIN TOTAL: 0.6 mg/dL (ref 0.3–1.2)
BUN: 21 mg/dL — ABNORMAL HIGH (ref 6–20)
CO2: 21 mmol/L — ABNORMAL LOW (ref 22–32)
Calcium: 8.8 mg/dL — ABNORMAL LOW (ref 8.9–10.3)
Chloride: 109 mmol/L (ref 101–111)
Creatinine, Ser: 0.76 mg/dL (ref 0.44–1.00)
GLUCOSE: 145 mg/dL — AB (ref 65–99)
POTASSIUM: 4.7 mmol/L (ref 3.5–5.1)
Sodium: 140 mmol/L (ref 135–145)
TOTAL PROTEIN: 6.1 g/dL — AB (ref 6.5–8.1)

## 2017-06-11 LAB — CBC WITH DIFFERENTIAL/PLATELET
BASOS ABS: 0 10*3/uL (ref 0.0–0.1)
BASOS PCT: 0 %
Eosinophils Absolute: 0 10*3/uL (ref 0.0–0.7)
Eosinophils Relative: 0 %
HEMATOCRIT: 33.6 % — AB (ref 36.0–46.0)
Hemoglobin: 10.3 g/dL — ABNORMAL LOW (ref 12.0–15.0)
LYMPHS PCT: 16 %
Lymphs Abs: 2.8 10*3/uL (ref 0.7–4.0)
MCH: 29.1 pg (ref 26.0–34.0)
MCHC: 30.7 g/dL (ref 30.0–36.0)
MCV: 94.9 fL (ref 78.0–100.0)
MONO ABS: 1.5 10*3/uL — AB (ref 0.1–1.0)
Monocytes Relative: 8 %
NEUTROS ABS: 13.1 10*3/uL — AB (ref 1.7–7.7)
NEUTROS PCT: 76 %
Platelets: 332 10*3/uL (ref 150–400)
RBC: 3.54 MIL/uL — AB (ref 3.87–5.11)
RDW: 15.5 % (ref 11.5–15.5)
WBC: 17.4 10*3/uL — AB (ref 4.0–10.5)

## 2017-06-11 LAB — GLUCOSE, CAPILLARY
Glucose-Capillary: 178 mg/dL — ABNORMAL HIGH (ref 65–99)
Glucose-Capillary: 208 mg/dL — ABNORMAL HIGH (ref 65–99)
Glucose-Capillary: 178 mg/dL — ABNORMAL HIGH (ref 65–99)
Glucose-Capillary: 206 mg/dL — ABNORMAL HIGH (ref 65–99)

## 2017-06-11 LAB — MAGNESIUM: Magnesium: 2.2 mg/dL (ref 1.7–2.4)

## 2017-06-11 LAB — ECHOCARDIOGRAM COMPLETE
HEIGHTINCHES: 67 in
WEIGHTICAEL: 1788.8 [oz_av]

## 2017-06-11 LAB — PATHOLOGIST SMEAR REVIEW: Path Review: REACTIVE

## 2017-06-11 MED ORDER — FUROSEMIDE 10 MG/ML IJ SOLN
20.0000 mg | Freq: Two times a day (BID) | INTRAMUSCULAR | Status: DC
  Administered 2017-06-11 – 2017-06-12 (×2): 20 mg via INTRAVENOUS
  Filled 2017-06-11 (×2): qty 2

## 2017-06-11 MED ORDER — ENSURE ENLIVE PO LIQD
237.0000 mL | Freq: Three times a day (TID) | ORAL | Status: DC
  Administered 2017-06-11 – 2017-06-12 (×2): 237 mL via ORAL

## 2017-06-11 MED ORDER — PREDNISONE 20 MG PO TABS
30.0000 mg | ORAL_TABLET | Freq: Every day | ORAL | Status: DC
  Administered 2017-06-12: 10:00:00 30 mg via ORAL
  Filled 2017-06-11: qty 1

## 2017-06-11 MED ORDER — FUROSEMIDE 10 MG/ML IJ SOLN
40.0000 mg | Freq: Once | INTRAMUSCULAR | Status: AC
  Administered 2017-06-11: 40 mg via INTRAVENOUS
  Filled 2017-06-11: qty 4

## 2017-06-11 NOTE — Evaluation (Signed)
Physical Therapy Evaluation Patient Details Name: Angela Hines MRN: [id number] DOB: 1932/06/04 Today's Date: 06/11/2017   History of Present Illness  Patient is a 84 y/o female who presents with SOB. Found to have Acute on chronic hypercarbic respiratory failure and A-fib. CXR-Increasing opacities in the lungs, right greater than left which may represent multifocal infiltrate versus asymmetric edema. PMH includes CHF, bioprosthetic AVR, COPD, HTN, DM, PAF.  Clinical Impression  Patient presents with generalized weakness, impaired balance, dyspnea on exertion and impaired mobility s/p above. Pt tolerated gait training with min A-Min guard for balance/safety. Would do well with RW for support. Sp02 dropped to mid 80s on RA but recovered quickly with cues for pursed lip breathing. HR up to 117 bpm A-fib. Pt with questionable safety awareness and staggering present during mobility. Will follow acutely to maximize independence and mobility prior to return home.     Follow Up Recommendations Home health PT;Supervision for mobility/OOB    Equipment Recommendations  Rolling walker with 5" wheels    Recommendations for Other Services       Precautions / Restrictions Precautions Precautions: Fall Precaution Comments: watch HR Restrictions Weight Bearing Restrictions: No      Mobility  Bed Mobility Overal bed mobility: Modified Independent Bed Mobility: Supine to Sit;Sit to Supine     Supine to sit: Modified independent (Device/Increase time) Sit to supine: Modified independent (Device/Increase time)   General bed mobility comments: No assist needed.   Transfers Overall transfer level: Needs assistance Equipment used: 1 person hand held assist Transfers: Sit to/from Stand Sit to Stand: Min guard         General transfer comment: Min guard for steady. Stood from [employer], from toilet x1.   Ambulation/Gait Ambulation/Gait assistance: Min guard;Min assist Ambulation Distance  (Feet): 200 Feet Assistive device: 1 person hand held assist;None Gait Pattern/deviations: Step-through pattern;Trunk flexed;Narrow base of support;Staggering right;Drifts right/left;Staggering left Gait velocity: decreased   General Gait Details: Slow, mildly unsteady gait with staggering at times but no overt LOB. Sp02 dropped to mid 80s on RA but recovered quickly with pursed lip breathing. HR up to 117 bpm A-fib.  Stairs            Wheelchair Mobility    Modified Rankin (Stroke Patients Only)       Balance Overall balance assessment: Needs assistance Sitting-balance support: Feet supported;No upper extremity supported Sitting balance-Leahy Scale: Fair     Standing balance support: During functional activity Standing balance-Leahy Scale: Fair                               Pertinent Vitals/Pain Pain Assessment: No/denies pain    Home Living Family/patient expects to be discharged to:: Other (Comment)(Independent living at [facility])               Home Equipment: None      Prior Function Level of Independence: Independent   Gait / Transfers Assistance Needed: used no AD; no falls  ADL's / Homemaking Assistance Needed: Does her own. Goes to dining hall for meals.        Hand Dominance   Dominant Hand: Right    Extremity/Trunk Assessment   Upper Extremity Assessment Upper Extremity Assessment: Defer to OT evaluation    Lower Extremity Assessment Lower Extremity Assessment: Generalized weakness       Communication   Communication: No difficulties  Cognition Arousal/Alertness: Awake/alert Behavior During Therapy: WFL for tasks assessed/performed Overall Cognitive  Status: Within Functional Limits for tasks assessed Area of Impairment: Safety/judgement                         Safety/Judgement: Decreased awareness of safety;Decreased awareness of deficits     General Comments: Pt staggering without device.  But seems  unaware of her deficits.        General Comments General comments (skin integrity, edema, etc.): Sons present during session.    Exercises     Assessment/Plan    PT Assessment Patient needs continued PT services  PT Problem List Decreased strength;Decreased range of motion;Decreased activity tolerance;Decreased balance;Decreased mobility;Decreased coordination;Decreased cognition;Decreased safety awareness;Cardiopulmonary status limiting activity       PT Treatment Interventions DME instruction;Gait training;Functional mobility training;Therapeutic activities;Therapeutic exercise;Balance training;Neuromuscular re-education;Patient/family education    PT Goals (Current goals can be found in the Care Plan section)  Acute Rehab PT Goals Patient Stated Goal: to go home PT Goal Formulation: With patient Time For Goal Achievement: 06/25/17 Potential to Achieve Goals: Good    Frequency Min 2X/week   Barriers to discharge Decreased caregiver support      Co-evaluation               AM-PAC PT "6 Clicks" Daily Activity  Outcome Measure Difficulty turning over in bed (including adjusting bedclothes, sheets and blankets)?: None Difficulty moving from lying on back to sitting on the side of the bed? : None Difficulty sitting down on and standing up from a chair with arms (e.g., wheelchair, bedside commode, etc,.)?: Unable Help needed moving to and from a bed to chair (including a wheelchair)?: A Little Help needed walking in hospital room?: A Little Help needed climbing 3-5 steps with a railing? : A Little 6 Click Score: 18    End of Session Equipment Utilized During Treatment: Gait belt Activity Tolerance: Patient tolerated treatment well;Treatment limited secondary to medical complications ([id number])(HE17) Patient left: in bed;with call bell/phone within reach;with bed alarm set;with family/visitor present Nurse Communication: Mobility status PT Visit Diagnosis: Unsteadiness  on feet (R26.81);Other abnormalities of gait and mobility (R26.89);Muscle weakness (generalized) (M62.81);Difficulty in walking, not elsewhere classified (R26.2)    Time: 4081-4481 PT Time Calculation (min) (ACUTE ONLY): 23 min   Charges:   PT Evaluation $PT Eval Moderate Complexity: 1 Mod PT Treatments $Gait Training: 8-22 mins   PT G Codes:        Wray Kearns, PT, DPT [phone]    Marguarite Arbour A Kamel Haven 06/11/2017, 4:20 PM

## 2017-06-11 NOTE — Progress Notes (Signed)
[facility] Progress Note  Patient: Angela Hines HKV:[id number]   PCP: Kelton Pillar, MD DOB: 1932/06/11   DOA: 06/09/2017   DOS: 06/11/2017   Date of Service: the patient was seen and examined on 06/11/2017  Subjective: Breathing is better.  No nausea no abdominal pain no chest pain.  No acute event overnight.  Brief hospital course: Pt. with PMH of AVR, A. fib, COPD, HLD, type II DM, pacemaker; admitted on 06/09/2017, presented with complaint of shortness of breath, was found to have acute hypercarbic respiratory failure. Currently further plan is continue current treatment.  Assessment and Plan: ##) Acute on chronic hypercarbic respiratory failure: Suspect that her respiratory failure is multifactorial.  She was recently hospitalized for what appears to be hospital-acquired pneumonia approximately 3 weeks ago.  She also has diffuse wheezing on exam.    Antibiotic but suspicious for a possible pneumonia.  I do not suspect that this is actually an active pneumonia and therefore will discontinue antibiotic.  Patient shows significant improvement and is currently on room air.  Suspect polypharmacy playing a role. - Levalbuterol and ipratropium every 4 hours and as needed Change steroids to p.o. as well.  ##) Paroxysmal atrial fibrillation with rapid ventricular response: likely exacerbated by both beta agonists and patient's illness. -Continue metoprolol tartrate 50 mg twice daily -Continue rivaroxaban 15 mg daily -Continue amiodarone 200 mg twice daily per cardiology note.  May require going up on metoprolol dose.  Will discuss with cardiology regarding further workup if remains RVR overnight.  ##) COPD: -Per above  ##) Hyperlipidemia: -Continue atorvastatin 20 mg  ##) Diastolic heart failure: Chronic, does not appear to be in exacerbation Resume furosemide.  ##) Type 2 diabetes on oral medication: -Hold metformin -Sliding scale insulin and before meals at  bedtime  ##) Psych/pain: -Continue lorazepam 0.5 mg at at bedtime -Continue gabapentin 600 mg nightly -Hold zolpidem 5 mg nightly -Appears to have significant anxiety and using lorazepam for that for a while, suspect that the patient requires definitive treatment for her anxiety disorder and depression.  Given patient's history of QT prolongation patient will be started on Wellbutrin.   Diet: Cardiac diet DVT Prophylaxis: on therapeutic anticoagulation.  Advance goals of care discussion: DNR DNI  Family Communication: no family was present at bedside, at the time of interview.    Disposition:  Discharge to home.  Consultants: none Procedures: nonoe  Antibiotics: Anti-infectives (From admission, onward)   Start     Dose/Rate Route Frequency Ordered Stop   06/10/17 2000  vancomycin (VANCOCIN) IVPB 750 mg/150 ml premix  Status:  Discontinued     750 mg 150 mL/hr over 60 Minutes Intravenous Every 24 hours 06/09/17 2124 06/11/17 1239   06/10/17 0400  piperacillin-tazobactam (ZOSYN) IVPB 3.375 g  Status:  Discontinued     3.375 g 12.5 mL/hr over 240 Minutes Intravenous Every 8 hours 06/09/17 2131 06/11/17 1239   06/10/17 0141  vancomycin (VANCOCIN) IVPB 1000 mg/200 mL premix  Status:  Discontinued     1,000 mg 200 mL/hr over 60 Minutes Intravenous Every 12 hours 06/10/17 0141 06/10/17 1108   06/09/17 2200  piperacillin-tazobactam (ZOSYN) IVPB 3.375 g  Status:  Discontinued     3.375 g 12.5 mL/hr over 240 Minutes Intravenous Every 8 hours 06/09/17 2130 06/09/17 2131   06/09/17 1945  vancomycin (VANCOCIN) IVPB 1000 mg/200 mL premix     1,000 mg 200 mL/hr over 60 Minutes Intravenous  Once 06/09/17 1932 06/09/17 2108   06/09/17 1945  piperacillin-tazobactam (  ZOSYN) IVPB 3.375 g     3.375 g 100 mL/hr over 30 Minutes Intravenous  Once 06/09/17 1932 06/09/17 2038       Objective: Physical Exam: Vitals:   06/11/17 0742 06/11/17 0849 06/11/17 1236 06/11/17 1618  BP: (!) 123/100  134/84  (!) 146/101  Pulse:  (!) 108    Resp:      Temp: 97.9 F (36.6 C)  98 F (36.7 C) 98 F (36.7 C)  TempSrc: Axillary  Oral Oral  SpO2: 100%     Weight:      Height:        Intake/Output Summary (Last 24 hours) at 06/11/2017 1713 Last data filed at 06/11/2017 1100 Gross per 24 hour  Intake 1210 ml  Output 1900 ml  Net -690 ml   Filed Weights   06/09/17 1825 06/10/17 1306 06/11/17 0429  Weight: 51.7 kg (114 lb) 51.1 kg (112 lb 9.6 oz) 50.7 kg (111 lb 12.8 oz)   General: Alert, Awake and Oriented to Time, Place and Person. Appear in mild distress, affect appropriate Eyes: PERRL, Conjunctiva normal ENT: Oral Mucosa clear moist. Neck: no JVD, no Abnormal Mass Or lumps Cardiovascular: S1 and S2 Present, no Murmur, Peripheral Pulses Present Respiratory: normal respiratory effort, Bilateral Air entry equal and Decreased, no use of accessory muscle, Clear to Auscultation, no Crackles, no wheezes Abdomen: Bowel Sound present, Soft and no tenderness, no hernia Skin: no redness, no Rash, no induration Extremities: no Pedal edema, no calf tenderness Neurologic: Grossly no focal neuro deficit. Bilaterally Equal motor strength  Data Reviewed: CBC: Recent Labs  Lab 06/07/17 1120 06/09/17 1855 06/10/17 0251 06/11/17 0423  WBC 6.6 17.4* 8.4 17.4*  NEUTROABS  --  6.6  --  13.1*  HGB 11.0* 11.9* 10.4* 10.3*  HCT 35.5* 38.9 33.4* 33.6*  MCV 96.5 99.7 96.5 94.9  PLT 278 339 252 809   Basic Metabolic Panel: Recent Labs  Lab 06/07/17 1120 06/09/17 1855 06/10/17 0251 06/11/17 0423  NA 142 142 140 140  K 2.9* 3.9 3.6 4.7  CL 108 112* 108 109  CO2 24 18* 18* 21*  GLUCOSE 89 259* 174* 145*  BUN 13 17 17  21*  CREATININE 0.80 0.81 0.70 0.76  CALCIUM 9.4 8.9 8.8* 8.8*  MG  --  1.8 1.5* 2.2    Liver Function Tests: Recent Labs  Lab 06/09/17 1855 06/10/17 0251 06/11/17 0423  AST 138* 95* 94*  ALT 79* 90* 95*  ALKPHOS 154* 137* 115  BILITOT 0.7 0.8 0.6  PROT 6.7 6.5  6.1*  ALBUMIN 3.3* 3.2* 3.1*   No results for input(s): LIPASE, AMYLASE in the last 168 hours. No results for input(s): AMMONIA in the last 168 hours. Coagulation Profile: No results for input(s): INR, PROTIME in the last 168 hours. Cardiac Enzymes: Recent Labs  Lab 06/09/17 1855  TROPONINI <0.03   BNP (last 3 results) No results for input(s): PROBNP in the last 8760 hours. CBG: Recent Labs  Lab 06/10/17 1710 06/10/17 2104 06/11/17 0741 06/11/17 1234 06/11/17 1617  GLUCAP 183* 136* 178* 208* 178*   Studies: No results found.  Scheduled Meds: . amiodarone  200 mg Oral BID  . buPROPion  75 mg Oral BID  . feeding supplement (ENSURE ENLIVE)  237 mL Oral TID BM  . furosemide  20 mg Intravenous BID  . gabapentin  600 mg Oral QHS  . insulin aspart  0-15 Units Subcutaneous TID WC  . insulin aspart  0-5 Units Subcutaneous QHS  .  LORazepam  0.5 mg Oral QHS  . metoprolol tartrate  50 mg Oral BID  . pantoprazole  40 mg Oral Daily  . [START ON 06/12/2017] predniSONE  30 mg Oral Q breakfast  . Rivaroxaban  15 mg Oral Q supper   Continuous Infusions:  PRN Meds: acetaminophen **OR** acetaminophen, HYDROcodone-acetaminophen, hydrOXYzine, ipratropium, levalbuterol, loratadine, ondansetron **OR** ondansetron (ZOFRAN) IV, phenol, polyethylene glycol, zolpidem  Time spent: 35 minutes  Author: Berle Mull, MD [facility] Pager: [phone] 06/11/2017 5:13 PM  If 7PM-7AM, please contact night-coverage at [email or web address], password [facility]

## 2017-06-11 NOTE — Progress Notes (Signed)
Pt noted to be hypertensive, tachpneic and with increased work of breathing after returning to bed from bathroom.  O2 sat on room air 90-92%, lungs with crackles in bases, expiratory wheezes throughout.  PRN nebulizer given and IV solumedrol per orders.  O2 at 2L applied after neb with sats 95, RR 34.  Pt with slight relief.  MD notified.  Will administer 40 IV lasix per new orders and cont to monitor closely.

## 2017-06-11 NOTE — Clinical Social Work Note (Signed)
Clinical Social Work Assessment  Patient Details  Name: Angela Hines MRN: [id number] Date of Birth: [birth year]  Date of referral:  06/11/17               Reason for consult:  (from facility - [facility])                Permission sought to share information with:  Chartered certified accountant granted to share information::  Yes, Verbal Permission Granted  Name::        Agency::  [facility]  Relationship::     Contact Information:     Housing/Transportation Living arrangements for the past 2 months:  [place] of Information:  Patient Patient Interpreter Needed:  None Criminal Activity/Legal Involvement Pertinent to Current Situation/Hospitalization:  No - Comment as needed Significant Relationships:  Adult Children Lives with:  Self Do you feel safe going back to the place where you live?  Yes Need for family participation in patient care:  Yes (Comment)  Care giving concerns: Patient from [place] at [facility]. Recently admitted in SNF at [facility]. Not assessed by PT yet this admission.   Social Worker assessment / plan: CSW met with patient at bedside. Patient indicated she went to [facility] for about a week after last admission in January. Patient reports she is able to ambulate independently and had gotten set up with home health PT after discharge from [facility], though she could not recall the agency name. CSW noted PT eval from last admission documented patient walking 150 ft. Patient prefers to return home with HHPT, or possibly stay with her son for a few days after this discharge. CSW signing off, will follow if PT recommends SNF again.   Employment status:  Retired Forensic scientist:  Medicare PT Recommendations:  Not assessed at this time [place] / Referral to community resources:  Other (Comment Required)(back to [facility] unless otherwise indicated by PT)  Patient/Family's Response to care: Patient appreciative of  care.  Patient/Family's Understanding of and Emotional Response to Diagnosis, Current Treatment, and Prognosis: Patient with understanding of her conditions and hopeful to return home with HHPT.  Emotional Assessment Appearance:  Appears stated age Attitude/Demeanor/Rapport:  Engaged Affect (typically observed):  Calm, Pleasant Orientation:  Oriented to Self, Oriented to Place, Oriented to  Time, Oriented to Situation Alcohol / Substance use:  Not Applicable Psych involvement (Current and /or in the community):  No (Comment)  Discharge Needs  Concerns to be addressed:  Discharge Planning Concerns, Care Coordination Readmission within the last 30 days:  Yes Current discharge risk:  Lives alone Barriers to Discharge:  Continued Medical Work up   Estanislado Emms, LCSW 06/11/2017, 3:50 PM

## 2017-06-11 NOTE — Progress Notes (Signed)
  Echocardiogram 2D Echocardiogram has been performed.  Angela Hines F 06/11/2017, 11:39 AM

## 2017-06-11 NOTE — Progress Notes (Signed)
Initial Nutrition Assessment  DOCUMENTATION CODES:   Severe malnutrition in context of chronic illness, Underweight  INTERVENTION:    Ensure Enlive po TID, each supplement provides 350 kcal and 20 grams of protein  NUTRITION DIAGNOSIS:   Severe Malnutrition related to chronic illness(COPD, CHF) as evidenced by severe muscle depletion, severe fat depletion.  GOAL:   Patient will meet greater than or equal to 90% of their needs  MONITOR:   PO intake, Supplement acceptance, Labs, I & O's  REASON FOR ASSESSMENT:   Consult COPD Protocol  ASSESSMENT:   84 yo female with PMH of DM, A fib, COPD, HTN, HLD, CHF who was admitted on 2/16 with SOB r/t acute hypercarbic respiratory failure, COPD exacerbation, PNA.  Patient reports that she has been eating okay. Meal completion 100% since yesterday. She prefers more salt on her food. She moved into an independent living facility a few months ago and likes the food there. She thinks she has been losing weight, but unable to quantify amount of weight loss. Per review of usual weights, weight has fluctuated between 104 and 114 lbs for the past 4 months. She loves Ensure, requested it between meals. Labs and medications reviewed. CBG's: [phone]  NUTRITION - FOCUSED PHYSICAL EXAM:    Most Recent Value  Orbital Region  Moderate depletion  Upper Arm Region  Severe depletion  Thoracic and Lumbar Region  Moderate depletion  Buccal Region  Severe depletion  Temple Region  Moderate depletion  Clavicle Bone Region  Severe depletion  Clavicle and Acromion Bone Region  Moderate depletion  Scapular Bone Region  Moderate depletion  Dorsal Hand  Severe depletion  Patellar Region  Severe depletion  Anterior Thigh Region  Severe depletion  Posterior Calf Region  Moderate depletion  Edema (RD Assessment)  None  Hair  Reviewed  Eyes  Reviewed  Mouth  Reviewed  Skin  Reviewed  Nails  Reviewed       Diet Order:  Aspiration  precautions Diet heart healthy/carb modified Room service appropriate? Yes; Fluid consistency: Thin  EDUCATION NEEDS:   No education needs have been identified at this time  Skin:  Skin Assessment: Reviewed RN Assessment  Last BM:  2/17  Height:   Ht Readings from Last 1 Encounters:  06/10/17 5\' 7"  (1.702 m)    Weight:   Wt Readings from Last 1 Encounters:  06/11/17 111 lb 12.8 oz (50.7 kg)    Ideal Body Weight:  61.4 kg  BMI:  Body mass index is 17.51 kg/m.  Estimated Nutritional Needs:   Kcal:  1500-1700  Protein:  75-85 gm  Fluid:  1.5 L    Molli Barrows, RD, LDN, [place] Pager [phone] After Hours Pager [phone]

## 2017-06-12 LAB — GLUCOSE, CAPILLARY: Glucose-Capillary: 124 mg/dL — ABNORMAL HIGH (ref 65–99)

## 2017-06-12 LAB — CBC WITH DIFFERENTIAL/PLATELET
BASOS ABS: 0 10*3/uL (ref 0.0–0.1)
BASOS PCT: 0 %
Eosinophils Absolute: 0 10*3/uL (ref 0.0–0.7)
Eosinophils Relative: 0 %
HEMATOCRIT: 29.1 % — AB (ref 36.0–46.0)
HEMOGLOBIN: 9.3 g/dL — AB (ref 12.0–15.0)
Lymphocytes Relative: 29 %
Lymphs Abs: 3.4 10*3/uL (ref 0.7–4.0)
MCH: 30.5 pg (ref 26.0–34.0)
MCHC: 32 g/dL (ref 30.0–36.0)
MCV: 95.4 fL (ref 78.0–100.0)
Monocytes Absolute: 0.7 10*3/uL (ref 0.1–1.0)
Monocytes Relative: 6 %
NEUTROS ABS: 7.5 10*3/uL (ref 1.7–7.7)
NEUTROS PCT: 65 %
Platelets: 256 10*3/uL (ref 150–400)
RBC: 3.05 MIL/uL — AB (ref 3.87–5.11)
RDW: 15.8 % — ABNORMAL HIGH (ref 11.5–15.5)
WBC: 11.6 10*3/uL — ABNORMAL HIGH (ref 4.0–10.5)

## 2017-06-12 LAB — BASIC METABOLIC PANEL
ANION GAP: 11 (ref 5–15)
BUN: 27 mg/dL — ABNORMAL HIGH (ref 6–20)
CALCIUM: 8.5 mg/dL — AB (ref 8.9–10.3)
CO2: 26 mmol/L (ref 22–32)
Chloride: 104 mmol/L (ref 101–111)
Creatinine, Ser: 0.73 mg/dL (ref 0.44–1.00)
GFR calc non Af Amer: >60 mL/min (ref >60)
Glucose, Bld: 126 mg/dL — ABNORMAL HIGH (ref 65–99)
POTASSIUM: 3.9 mmol/L (ref 3.5–5.1)
Sodium: 141 mmol/L (ref 135–145)

## 2017-06-12 LAB — MAGNESIUM: Magnesium: 1.9 mg/dL (ref 1.7–2.4)

## 2017-06-12 MED ORDER — BUPROPION HCL 75 MG PO TABS: 75.0000 mg | ORAL_TABLET | Freq: Two times a day (BID) | ORAL | 0 refills | Status: DC

## 2017-06-12 MED ORDER — AMIODARONE HCL 200 MG PO TABS: ORAL_TABLET | ORAL | Status: DC

## 2017-06-12 MED ORDER — POTASSIUM CHLORIDE CRYS ER 10 MEQ PO TBCR: 20.0000 meq | EXTENDED_RELEASE_TABLET | Freq: Every day | ORAL | 0 refills | Status: DC

## 2017-06-12 MED ORDER — POLYETHYLENE GLYCOL 3350 17 G PO PACK: 17.0000 g | PACK | Freq: Every day | ORAL | 0 refills | Status: DC | PRN

## 2017-06-12 MED ORDER — FUROSEMIDE 20 MG PO TABS: 40.0000 mg | ORAL_TABLET | Freq: Every day | ORAL | 0 refills | Status: DC

## 2017-06-12 MED ORDER — HYDROXYZINE HCL 10 MG PO TABS: 10.0000 mg | ORAL_TABLET | Freq: Three times a day (TID) | ORAL | 0 refills | Status: DC | PRN

## 2017-06-12 MED ORDER — ENSURE ENLIVE PO LIQD: 237.0000 mL | Freq: Three times a day (TID) | ORAL | 12 refills | Status: DC

## 2017-06-12 MED ORDER — PREDNISONE 10 MG PO TABS: ORAL_TABLET | ORAL | 0 refills | Status: DC

## 2017-06-12 NOTE — Care Management Note (Addendum)
Case Management Note  Patient Details  Name: Angela Hines MRN: [id number] Date of Birth: 11-21-1932  Subjective/Objective:  Pt presented for Acute on Chronic Respiratory Failure. Pt is from [facility] and wants to return to [facility]. Agency list provided and pt wants to utilize the PT onsite via [employer].                   Action/Plan: CM did call [facility] to make referral. CM to fax information to [facility]. RW to be delivered to room via [facility] Patient to call son for transportation home. No further needs from CM at this time.   Expected Discharge Date:  06/12/17               Expected Discharge Plan:  [place]  In-House Referral:  NA  Discharge planning Services  CM Consult  Post Acute Care Choice:  Durable Medical Equipment, Home Health Choice offered to:  Patient  DME Arranged:  [facility] rolling DME Agency:  [place]:  PT [facility] Agency:  Other - See comment([facility] via [place])  Status of Service:  Completed, signed off  If discussed at [place] of Stay Meetings, dates discussed:    Additional Comments: 1028 06-12-17 Jacqlyn Krauss, RN,BSN [phone] Patient just wants PT Services- [facility] RN and [employer]. No further needs from CM at this time.  Bethena Roys, RN 06/12/2017, 10:15 AM

## 2017-06-12 NOTE — Plan of Care (Signed)
  Progressing Clinical Measurements: Will remain free from infection 06/12/2017 0407 - Progressing by Colonel Bald, RN Note No s/s of infection noted. Pain Managment: General experience of comfort will improve 06/12/2017 0407 - Progressing by Colonel Bald, RN Note Denies c/o pain or discomfort.

## 2017-06-12 NOTE — Discharge Instructions (Signed)
Cardioversion scheduled for Friday, February 22nd             - Arrive at the [facility] and go to admitting at 10:30AM             -Do not eat or drink anything after midnight the night prior to your procedure.             - Take all your medication with a sip of water prior to arrival.             - Do NOT miss any doses of Xarelto - please call us at [phone] if you should miss a dose.             - You will not be able to drive home after your procedure.  ---- Decrease Amiodarone to 200mg  ONCE A DAY on Monday, February 25th

## 2017-06-14 ENCOUNTER — Other Ambulatory Visit (HOSPITAL_COMMUNITY): Payer: Medicare Other | Admitting: Nurse Practitioner

## 2017-06-14 LAB — CULTURE, BLOOD (ROUTINE X 2)
CULTURE: NO GROWTH
Culture: NO GROWTH
Special Requests: ADEQUATE
Special Requests: ADEQUATE

## 2017-06-15 ENCOUNTER — Other Ambulatory Visit: Payer: Self-pay

## 2017-06-15 ENCOUNTER — Ambulatory Visit (HOSPITAL_COMMUNITY)
Admission: RE | Admit: 2017-06-15 | Discharge: 2017-06-15 | Disposition: A | Discharge status: Home / Self Care | Payer: Medicare Other | Source: Ambulatory Visit | Attending: Cardiology | Admitting: Cardiology

## 2017-06-15 ENCOUNTER — Encounter (HOSPITAL_COMMUNITY)
Admission: RE | Disposition: A | Discharge status: Home / Self Care | Payer: Self-pay | Source: Ambulatory Visit | Attending: Cardiology

## 2017-06-15 ENCOUNTER — Encounter (HOSPITAL_COMMUNITY): Payer: Self-pay | Admitting: Cardiology

## 2017-06-15 ENCOUNTER — Ambulatory Visit (HOSPITAL_COMMUNITY): Payer: Medicare Other | Admitting: Anesthesiology

## 2017-06-15 DIAGNOSIS — Z7901 Long term (current) use of anticoagulants: Secondary | ICD-10-CM | POA: Diagnosis not present

## 2017-06-15 DIAGNOSIS — Z952 Presence of prosthetic heart valve: Secondary | ICD-10-CM | POA: Insufficient documentation

## 2017-06-15 DIAGNOSIS — C9000 Multiple myeloma not having achieved remission: Secondary | ICD-10-CM | POA: Diagnosis not present

## 2017-06-15 DIAGNOSIS — Z950 Presence of cardiac pacemaker: Secondary | ICD-10-CM | POA: Diagnosis not present

## 2017-06-15 DIAGNOSIS — I480 Paroxysmal atrial fibrillation: Secondary | ICD-10-CM | POA: Diagnosis present

## 2017-06-15 DIAGNOSIS — I110 Hypertensive heart disease with heart failure: Secondary | ICD-10-CM | POA: Insufficient documentation

## 2017-06-15 DIAGNOSIS — I481 Persistent atrial fibrillation: Secondary | ICD-10-CM | POA: Diagnosis not present

## 2017-06-15 DIAGNOSIS — Z7984 Long term (current) use of oral hypoglycemic drugs: Secondary | ICD-10-CM | POA: Diagnosis not present

## 2017-06-15 DIAGNOSIS — E7800 Pure hypercholesterolemia, unspecified: Secondary | ICD-10-CM | POA: Insufficient documentation

## 2017-06-15 DIAGNOSIS — Z885 Allergy status to narcotic agent status: Secondary | ICD-10-CM | POA: Diagnosis not present

## 2017-06-15 DIAGNOSIS — I4892 Unspecified atrial flutter: Secondary | ICD-10-CM | POA: Diagnosis not present

## 2017-06-15 DIAGNOSIS — Z79899 Other long term (current) drug therapy: Secondary | ICD-10-CM | POA: Diagnosis not present

## 2017-06-15 DIAGNOSIS — J449 Chronic obstructive pulmonary disease, unspecified: Secondary | ICD-10-CM | POA: Insufficient documentation

## 2017-06-15 DIAGNOSIS — Z881 Allergy status to other antibiotic agents status: Secondary | ICD-10-CM | POA: Diagnosis not present

## 2017-06-15 DIAGNOSIS — I509 Heart failure, unspecified: Secondary | ICD-10-CM | POA: Insufficient documentation

## 2017-06-15 DIAGNOSIS — E119 Type 2 diabetes mellitus without complications: Secondary | ICD-10-CM | POA: Diagnosis not present

## 2017-06-15 HISTORY — PX: CARDIOVERSION: SHX1299

## 2017-06-15 SURGERY — CARDIOVERSION
Anesthesia: General

## 2017-06-15 MED ORDER — LIDOCAINE HCL (CARDIAC) 20 MG/ML IV SOLN
INTRAVENOUS | Status: DC | PRN
  Administered 2017-06-15: 60 mg via INTRATRACHEAL

## 2017-06-15 MED ORDER — SODIUM CHLORIDE 0.9 % IV SOLN: INTRAVENOUS | Status: DC

## 2017-06-15 MED ORDER — PROPOFOL 10 MG/ML IV BOLUS
INTRAVENOUS | Status: DC | PRN
  Administered 2017-06-15: 50 mg via INTRAVENOUS

## 2017-06-15 MED ORDER — SODIUM CHLORIDE 0.9 % IV SOLN
INTRAVENOUS | Status: DC | PRN
  Administered 2017-06-15: 11:00:00 via INTRAVENOUS
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
## 2017-06-15 NOTE — Procedures (Signed)
Electrical Cardioversion Procedure Note Sera Hitsman [id number] 02-10-1933  Procedure: Electrical Cardioversion Indications:  Atrial Fibrillation  Procedure Details Consent: Risks of procedure as well as the alternatives and risks of each were explained to the (patient/caregiver).  Consent for procedure obtained. Time Out: Verified patient identification, verified procedure, site/side was marked, verified correct patient position, special equipment/implants available, medications/allergies/relevent history reviewed, required imaging and test results available.  Performed  Patient placed on cardiac monitor, pulse oximetry, supplemental oxygen as necessary.  Sedation given: Pt sedated with lidocaine 60 mg and diprovan 50 mg IV. Pacer pads placed anterior and posterior chest.  Cardioverted 1 time(s).  Cardioverted at 120J.  Evaluation Findings: Post procedure EKG shows: Sinus bradycardia Complications: None Patient did tolerate procedure well.   Kirk Ruths 06/15/2017, 8:28 AM

## 2017-06-15 NOTE — Anesthesia Preprocedure Evaluation (Signed)
Anesthesia Evaluation  Patient identified by MRN, date of birth, ID band Patient awake    Reviewed: Allergy & Precautions, NPO status , Patient's Chart, lab work & pertinent test results, reviewed documented beta blocker date and time   Airway Mallampati: II  TM Distance: >3 FB Neck ROM: Full    Dental   Pulmonary COPD,    Pulmonary exam normal breath sounds clear to auscultation       Cardiovascular hypertension, Pt. on medications and Pt. on home beta blockers +CHF  Normal cardiovascular exam+ dysrhythmias Atrial Fibrillation + Valvular Problems/Murmurs AS  Rhythm:Irregular Rate:Normal  S/p AVR  '19 TTE - moderate concentric LVH. EF was in the range of 40% to 45%. Diffuse hypokinesis. A bioprosthetic valve is present in the aortic position. There was moderate stenosis. There was moderate MR directed posteriorly. Left atrium was moderately dilated. RV systolic function was moderately reduced. Moderate TR. PASP mildly increased at 33 mm Hg (S).   Neuro/Psych Anxiety negative neurological ROS     GI/Hepatic Neg liver ROS, GERD  Controlled and Medicated,  Endo/Other  diabetes, Type 2, Oral Hypoglycemic Agents  Renal/GU negative Renal ROS  negative genitourinary   Musculoskeletal negative musculoskeletal ROS (+)   Abdominal   Peds  Hematology negative hematology ROS (+)   Anesthesia Other Findings   Reproductive/Obstetrics                             Anesthesia Physical Anesthesia Plan  ASA: III  Anesthesia Plan: General   Post-op Pain Management:    Induction: Intravenous  PONV Risk Score and Plan: Treatment may vary due to age or medical condition  Airway Management Planned: Natural Airway and Mask  Additional Equipment: None  Intra-op Plan:   Post-operative Plan:   Informed Consent: I have reviewed the patients History and Physical, chart, labs and discussed the procedure  including the risks, benefits and alternatives for the proposed anesthesia with the patient or authorized representative who has indicated his/her understanding and acceptance.     Plan Discussed with: CRNA  Anesthesia Plan Comments:         Anesthesia Quick Evaluation

## 2017-06-15 NOTE — Discharge Instructions (Signed)
Electrical Cardioversion, Care After °This sheet gives you information about how to care for yourself after your procedure. Your health care provider may also give you more specific instructions. If you have problems or questions, contact your health care provider. °What can I expect after the procedure? °After the procedure, it is common to have: °· Some redness on the skin where the shocks were given. ° °Follow these instructions at home: °· Do not drive for 24 hours if you were given a medicine to help you relax (sedative). °· Take over-the-counter and prescription medicines only as told by your health care provider. °· Ask your health care provider how to check your pulse. Check it often. °· Rest for 48 hours after the procedure or as told by your health care provider. °· Avoid or limit your caffeine use as told by your health care provider. °Contact a health care provider if: °· You feel like your heart is beating too quickly or your pulse is not regular. °· You have a serious muscle cramp that does not go away. °Get help right away if: °· You have discomfort in your chest. °· You are dizzy or you feel faint. °· You have trouble breathing or you are short of breath. °· Your speech is slurred. °· You have trouble moving an arm or leg on one side of your body. °· Your fingers or toes turn cold or blue. °This information is not intended to replace advice given to you by your health care provider. Make sure you discuss any questions you have with your health care provider. °Document Released: 01/29/2013 Document Revised: 11/12/2015 Document Reviewed: 10/15/2015 °Elsevier Interactive Patient Education © 2018 [employer] Inc[employer] ° °

## 2017-06-15 NOTE — Transfer of Care (Signed)
Immediate Anesthesia Transfer of Care Note  Patient: Angela Hines  Procedure(s) Performed: CARDIOVERSION (N/A )  Patient Location: Endoscopy Unit  Anesthesia Type:General  Level of Consciousness: awake, alert  and oriented  Airway & Oxygen Therapy: Patient Spontanous Breathing and Patient connected to nasal cannula oxygen  Post-op Assessment: Report given to RN and Post -op Vital signs reviewed and stable  Post vital signs: Reviewed and stable  Last Vitals:  Vitals:   06/15/17 1043  BP: (!) 135/102  Pulse: 91  Resp: 14  Temp: 36.8 C  SpO2: 100%    Last Pain:  Vitals:   06/15/17 1043  TempSrc: Oral         Complications: No apparent anesthesia complications

## 2017-06-15 NOTE — Anesthesia Postprocedure Evaluation (Signed)
Anesthesia Post Note  Patient: Angela Hines  Procedure(s) Performed: CARDIOVERSION (N/A )     Patient location during evaluation: PACU Anesthesia Type: General Level of consciousness: awake and alert Pain management: pain level controlled Vital Signs Assessment: post-procedure vital signs reviewed and stable Respiratory status: spontaneous breathing, nonlabored ventilation and respiratory function stable Cardiovascular status: blood pressure returned to baseline and stable Postop Assessment: no apparent nausea or vomiting Anesthetic complications: no    Last Vitals:  Vitals:   06/15/17 1200 06/15/17 1210  BP: (!) 93/52 96/60  Pulse: (!) 53 (!) 55  Resp: 20 (!) 21  Temp: 36.6 C   SpO2: 100% 99%    Last Pain:  Vitals:   06/15/17 1200  TempSrc: Oral                 Audry Pili

## 2017-06-15 NOTE — Interval H&P Note (Signed)
History and Physical Interval Note:  06/15/2017 11:48 AM  Angela Hines  has presented today for surgery, with the diagnosis of A-FIB  The various methods of treatment have been discussed with the patient and family. After consideration of risks, benefits and other options for treatment, the patient has consented to  Procedure(s): CARDIOVERSION (N/A) as a surgical intervention .  The patient's history has been reviewed, patient examined, no change in status, stable for surgery.  I have reviewed the patient's chart and labs.  Questions were answered to the patient's satisfaction.     Kirk Ruths

## 2017-06-16 ENCOUNTER — Encounter (HOSPITAL_COMMUNITY): Payer: Self-pay | Admitting: Cardiology

## 2017-06-17 NOTE — Discharge Summary (Signed)
[facility] Discharge Summary   Patient: Angela Hines CVE:[id number]   PCP: Kelton Pillar, MD DOB: January 02, 1933   Date of admission: 06/09/2017   Date of discharge: 06/12/2017     Discharge Diagnoses:  Active Problems:   Atrial fibrillation with RVR (HCC)   COPD with acute exacerbation (HCC)   Diabetes mellitus (HCC)   Generalized anxiety disorder   Hypercholesterolemia   Hypertension   AF (paroxysmal atrial fibrillation) (HCC)   S/P AVR (aortic valve replacement)   Acute and chronic respiratory failure with hypoxia (HCC)   Healthcare-associated pneumonia   Protein-calorie malnutrition, severe   Admitted From: home Disposition:  home  Recommendations for Outpatient Follow-up:  1. Please follow up with PCP in 1 week   Follow-up Information    Kelton Pillar, MD. Schedule an appointment as soon as possible for a visit in 1 week(s).   Specialty:  Family Medicine Contact information: [address] [place] [address] [phone]        [facility] - Dme Follow up.   Why:  Best boy information: [address] [place] [address] [phone]          Diet recommendation: cardiac diet  Activity: The patient is advised to gradually reintroduce usual activities.  Discharge Condition: good  Code Status: DNR DNI  History of present illness: As per the H and P dictated on admission, "Angela Hines is a 84 y.o. female with medical history significant of bioprosthetic aortic valve replacement, paroxysmal atrial fibrillation on rivaroxaban status post TEE/DC cardioversion in 2016 with loop placed for syncope, diastolic congestive heart failure, COPD, hyperlipidemia, type 2 diabetes on oral medication who comes in in respiratory distress.  Unable to obtain history as patient is currently on BiPAP and quite sleepy as Ativan was given in the ED.  Per review of the chart patient began to have shortness of breath  starting today which then triggered her anxiety which worsened her shortness of breath.  She reportedly trialed multiple doses of her albuterol inhaler without much improvement.  EMS came and gave her an albuterol nebulizer as well as a dose of metoprolol for atrial fibrillation with RVR.  Cannot participate in review of system"  Hospital Course:  Summary of her active problems in the hospital is as following. ##) Acute on chronic hypercarbic respiratory failure: Suspect that her respiratory failure is multifactorial. She was recently hospitalized for what appears to be hospital-acquired pneumonia approximately 3 weeks ago. She also has diffuse wheezing on exam.    Antibiotics were given for suspicion for a possible pneumonia.  I do not suspect that this is actually an active pneumonia and therefore will discontinue antibiotic.  Patient shows significant improvement and is currently on room air.  Suspect polypharmacy playing a role. - Levalbuterol and ipratropium every 4 hours and as needed Change steroids to p.o. as well.  ##) Paroxysmal atrial fibrillation with rapid ventricular response:likely exacerbated by both beta agonists and patient's illness. -Continue metoprolol tartrate 50 mg twice daily -Continue rivaroxaban 15 mg daily -Continue amiodarone 200 mg twice daily per cardiology note.   ##) COPD: -Per above  ##) Hyperlipidemia: -Continue atorvastatin 20 mg  ##) Diastolic heart failure: Chronic, does not appear to be in exacerbation Resume furosemide.  ##) Type 2 diabetes on oral medication: Resume home meds  ##) Psych/pain: -Continue lorazepam 0.5 mg at at bedtime -Continue gabapentin 600 mg nightly -Hold zolpidem 5 mg nightly  -Appears to have significant anxiety and  using lorazepam for that for a while, suspect that the patient requires definitive treatment for her anxiety disorder and depression.  Given patient's history of QT prolongation patient will be started  on Wellbutrin.  All other chronic medical condition were stable during the hospitalization.  Patient was seen by physical therapy, who recommended home health, which was arranged by Education officer, museum and case Freight forwarder. On the day of the discharge the patient's vitals were stable, and no other acute medical condition were reported by patient. the patient was felt safe to be discharge at home with home health.  Procedures and Results:  Echocardiogram   Consultations:  none  DISCHARGE MEDICATION: Allergies as of 06/12/2017      Reactions   Clonidine Swelling   "head swelling"   Captopril Swelling   Codeine Nausea And Vomiting   GI Intolerance   Ceclor [cefaclor] Rash      Medication List    STOP taking these medications   amoxicillin 500 MG capsule Commonly known as:  AMOXIL   diphenhydramine-acetaminophen 25-500 MG Tabs tablet Commonly known as:  TYLENOL PM     TAKE these medications   albuterol 108 (90 Base) MCG/ACT inhaler Commonly known as:  PROVENTIL HFA;VENTOLIN HFA Inhale 2 puffs into the lungs every 6 (six) hours as needed for shortness of breath.   amiodarone 200 MG tablet Commonly known as:  PACERONE Take bid till 06/18/2017, then take daily What changed:    how much to take  how to take this  when to take this  additional instructions   atorvastatin 20 MG tablet Commonly known as:  LIPITOR Take 20 mg by mouth every evening.   bismuth subsalicylate 767 HA/19FX suspension Commonly known as:  PEPTO BISMOL Take 30 mLs by mouth every 6 (six) hours as needed for indigestion.   buPROPion 75 MG tablet Commonly known as:  WELLBUTRIN Take 1 tablet (75 mg total) by mouth 2 (two) times daily.   feeding supplement (ENSURE ENLIVE) Liqd Take 237 mLs by mouth 3 (three) times daily between meals.   furosemide 20 MG tablet Commonly known as:  LASIX Take 2 tablets (40 mg total) by mouth daily. What changed:  how much to take   gabapentin 300 MG  capsule Commonly known as:  NEURONTIN Take 600 mg by mouth at bedtime.   hydrOXYzine 10 MG tablet Commonly known as:  ATARAX/VISTARIL Take 1 tablet (10 mg total) by mouth 3 (three) times daily as needed for anxiety.   loratadine 10 MG tablet Commonly known as:  CLARITIN Take 10 mg by mouth daily.   LORazepam 0.5 MG tablet Commonly known as:  ATIVAN Take 0.5 mg by mouth See admin instructions. Take one tablet (0.5 mg) by mouth daily at bedtime, may also take one tablet (0.5 mg) during the day as needed for anxiety   magnesium oxide 400 (241.3 Mg) MG tablet Commonly known as:  MAG-OX Take 1 tablet (400 mg total) by mouth daily.   metFORMIN 1000 MG tablet Commonly known as:  GLUCOPHAGE Take 1,000 mg by mouth daily.   metoprolol tartrate 50 MG tablet Commonly known as:  LOPRESSOR Take 1 tablet (50 mg total) by mouth 2 (two) times daily.   omeprazole 20 MG capsule Commonly known as:  PRILOSEC Take 20 mg by mouth daily.   polyethylene glycol packet Commonly known as:  MIRALAX / GLYCOLAX Take 17 g by mouth daily as needed for mild constipation.   potassium chloride 10 MEQ tablet Commonly known as:  K-DUR,KLOR-CON  Take 2 tablets (20 mEq total) by mouth daily. What changed:    how much to take  when to take this   predniSONE 10 MG tablet Commonly known as:  DELTASONE Take 30mg  daily for 3days,Take 20mg  daily for 3days,Take 10mg  daily for 3days, then stop.   Rivaroxaban 15 MG Tabs tablet Commonly known as:  XARELTO Take 15 mg by mouth daily with supper.   zolpidem 5 MG tablet Commonly known as:  AMBIEN Take 1 tablet (5 mg total) by mouth at bedtime as needed for sleep.      Allergies  Allergen Reactions  . Clonidine Swelling    "head swelling"  . Captopril Swelling  . Codeine Nausea And Vomiting    GI Intolerance   . Ceclor [Cefaclor] Rash   Discharge Instructions    Diet - low sodium heart healthy   Complete by:  As directed    Increase activity slowly    Complete by:  As directed      Discharge Exam: Filed Weights   06/10/17 1306 06/11/17 0429 06/12/17 0436  Weight: 51.1 kg (112 lb 9.6 oz) 50.7 kg (111 lb 12.8 oz) 50.5 kg (111 lb 6.4 oz)   Vitals:   06/12/17 0726 06/12/17 0823  BP: (!) 106/91   Pulse: 99 61  Resp: (!) 21 19  Temp: 98 F (36.7 C)   SpO2: 98% 96%   General: Appear in no distress, no Rash; Oral Mucosa moist. Cardiovascular: S1 and S2 Present, no Murmur, no JVD Respiratory: Bilateral Air entry present and Clear to Auscultation, no Crackles, no wheezes Abdomen: Bowel Sound present, Soft and no tenderness Extremities: no Pedal edema, no calf tenderness Neurology: Grossly no focal neuro deficit.  The results of significant diagnostics from this hospitalization (including imaging, microbiology, ancillary and laboratory) are listed below for reference.    Significant Diagnostic Studies: Dg Chest Port 1 View  Result Date: 06/09/2017 CLINICAL DATA:  Shortness of breath and difficulty breathing. EXAM: PORTABLE CHEST 1 VIEW COMPARISON:  May 17, 2017 FINDINGS: No pneumothorax. Increasing hazy opacity in the right mid lower lung. Minimal increased density in the left perihilar region. The cardiomediastinal silhouette is stable. No other acute abnormalities. IMPRESSION: Increasing opacities in the lungs, right greater than left may represent developing multifocal infiltrate versus asymmetric edema. Recommend clinical correlation. Electronically Signed   By: Dorise Bullion III M.D   On: 06/09/2017 19:17    Microbiology: Recent Results (from the past 240 hour(s))  Culture, blood (Routine X 2) w Reflex to ID Panel     Status: None   Collection Time: 06/09/17  8:00 PM  Result Value Ref Range Status   Specimen Description BLOOD RIGHT ARM  Final   Special Requests   Final    BOTTLES DRAWN AEROBIC AND ANAEROBIC Blood Culture adequate volume   Culture   Final    NO GROWTH 5 DAYS Performed at [facility], [address], [place], [place] [address]    Report Status 06/14/2017 FINAL  Final  Culture, blood (Routine X 2) w Reflex to ID Panel     Status: None   Collection Time: 06/09/17  8:00 PM  Result Value Ref Range Status   Specimen Description BLOOD LEFT ARM  Final   Special Requests   Final    BOTTLES DRAWN AEROBIC AND ANAEROBIC Blood Culture adequate volume   Culture   Final    NO GROWTH 5 DAYS Performed at [facility], [place] [address]  [place], [place] [address]    Report Status 06/14/2017 FINAL  Final  MRSA PCR Screening     Status: None   Collection Time: 06/10/17  1:22 PM  Result Value Ref Range Status   MRSA by PCR NEGATIVE NEGATIVE Final    Comment:        The GeneXpert MRSA Assay (FDA approved for NASAL specimens only), is one component of a comprehensive MRSA colonization surveillance program. It is not intended to diagnose MRSA infection nor to guide or monitor treatment for MRSA infections. Performed at [facility], [place] [address], [place], [place] [address]      Labs: CBC: Recent Labs  Lab 06/11/17 0423 06/12/17 0759  WBC 17.4* 11.6*  NEUTROABS 13.1* 7.5  HGB 10.3* 9.3*  HCT 33.6* 29.1*  MCV 94.9 95.4  PLT 332 177   Basic Metabolic Panel: Recent Labs  Lab 06/11/17 0423 06/12/17 0759  NA 140 141  K 4.7 3.9  CL 109 104  CO2 21* 26  GLUCOSE 145* 126*  BUN 21* 27*  CREATININE 0.76 0.73  CALCIUM 8.8* 8.5*  MG 2.2 1.9   Liver Function Tests: Recent Labs  Lab 06/11/17 0423  AST 94*  ALT 95*  ALKPHOS 115  BILITOT 0.6  PROT 6.1*  ALBUMIN 3.1*   No results for input(s): LIPASE, AMYLASE in the last 168 hours. No results for input(s): AMMONIA in the last 168 hours. Cardiac Enzymes: No results for input(s): CKTOTAL, CKMB, CKMBINDEX, TROPONINI in the last 168 hours. BNP (last 3 results) Recent Labs    05/15/17 2035 06/09/17 1855  BNP 831.6* 626.8*   CBG: Recent Labs  Lab 06/11/17 0741 06/11/17 1234 06/11/17 1617 06/11/17 2025  06/12/17 0725  GLUCAP 178* 208* 178* 206* 124*   Time spent: 35 minutes  Signed:  Berle Mull  [facility] 06/12/2017 , 7:34 PM

## 2017-06-22 ENCOUNTER — Telehealth (HOSPITAL_COMMUNITY): Payer: Self-pay | Admitting: *Deleted

## 2017-06-22 NOTE — Telephone Encounter (Signed)
Pt daughter Heddy called in to let us know that when physical therapy came in patients  HR was 49.  She was unable to tell if HR went up with exercise so she is going to call the assisted living and check.  Pt has appt with [facility] Monday.  Pt is not experiencing any distress.

## 2017-06-25 ENCOUNTER — Encounter: Payer: Self-pay | Admitting: Internal Medicine

## 2017-06-25 ENCOUNTER — Ambulatory Visit (INDEPENDENT_AMBULATORY_CARE_PROVIDER_SITE_OTHER): Payer: Medicare Other | Admitting: Internal Medicine

## 2017-06-25 VITALS — BP 126/80 | HR 55 | Ht 67.0 in | Wt 112.0 lb

## 2017-06-25 DIAGNOSIS — I480 Paroxysmal atrial fibrillation: Secondary | ICD-10-CM

## 2017-06-25 DIAGNOSIS — I10 Essential (primary) hypertension: Secondary | ICD-10-CM

## 2017-06-25 LAB — CUP PACEART INCLINIC DEVICE CHECK
Implantable Pulse Generator Implant Date: 20160407
MDC IDC SESS DTM: 20181029142842

## 2017-06-25 MED ORDER — METOPROLOL TARTRATE 50 MG PO TABS: 25.0000 mg | ORAL_TABLET | Freq: Two times a day (BID) | ORAL | 3 refills | Status: DC

## 2017-06-25 NOTE — Patient Instructions (Addendum)
Medication Instructions:  Your physician has recommended you make the following change in your medication: 1.  Decrease your metoprolol 50 mg.  Take 1/2 tablet by mouth twice a day. 2.  You may stop taking Lipitor  Labwork: None ordered.  Testing/Procedures: None ordered.  Follow-Up: Your physician wants you to follow-up in: 4 weeks with Roderic Palau, NP at [facility] clinic.  You will follow up with Dr. Rayann Heman in 3 months.  Any Other Special Instructions Will Be Listed Below (If Applicable).  If you need a refill on your cardiac medications before your next appointment, please call your pharmacy.

## 2017-06-25 NOTE — Progress Notes (Signed)
PCP: Kelton Pillar, MD Primary Cardiologist:  Dr Johnsie Cancel Primary EP:  Dr Rayann Heman  Angela Hines is a 84 y.o. female who presents today for routine electrophysiology followup.  Since last being seen in our clinic, the patient reports doing very well.  Today, she denies symptoms of palpitations, chest pain, shortness of breath,  lower extremity edema, dizziness, presyncope, or syncope.  The patient is otherwise without complaint today.   Past Medical History:  Diagnosis Date  . Atrial fibrillation ([place])   . CHF (congestive heart failure) ([place])   . COPD (chronic obstructive pulmonary disease) ([place])   . Diabetes mellitus without complication ([place])   . High cholesterol   . Hypertension    Past Surgical History:  Procedure Laterality Date  . CARDIAC PACEMAKER PLACEMENT  07/30/2014  . 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 CARDIOVERSION N/A 06/15/2017   Procedure: CARDIOVERSION;  Surgeon: Lelon Perla, MD;  Location: [facility] ENDOSCOPY;  Service: Cardiovascular;  Laterality: N/A;  . CHOLECYSTECTOMY    . EXCISIONAL HEMORRHOIDECTOMY      ROS- all systems are reviewed and negative except as per HPI above  Current Outpatient Medications  Medication Sig Dispense Refill  . albuterol (PROVENTIL HFA;VENTOLIN HFA) 108 (90 Base) MCG/ACT inhaler Inhale 2 puffs into the lungs every 6 (six) hours as needed for shortness of breath.    Marland Kitchen amiodarone (PACERONE) 200 MG tablet Take bid till 06/18/2017, then take daily (Patient taking differently: Take 200 mg by mouth daily. Take bid till 06/18/2017, then take daily)    . bismuth subsalicylate (PEPTO BISMOL) 262 MG/15ML suspension Take 30 mLs by mouth every 6 (six) hours as needed for indigestion.    Marland Kitchen buPROPion (WELLBUTRIN) 75 MG tablet Take 1 tablet (75 mg total) by mouth 2 (two) times daily. 60 tablet 0  . feeding supplement, ENSURE ENLIVE, (ENSURE ENLIVE) LIQD Take 237 mLs by mouth 3 (three) times daily between meals. 237 mL 12  .  fluticasone (FLONASE) 50 MCG/ACT nasal spray Place 1 spray into both nostrils as needed for allergies or rhinitis.    . furosemide (LASIX) 20 MG tablet Take 2 tablets (40 mg total) by mouth daily. 30 tablet 0  . gabapentin (NEURONTIN) 300 MG capsule Take 600 mg by mouth at bedtime.     . hydrOXYzine (ATARAX/VISTARIL) 10 MG tablet Take 1 tablet (10 mg total) by mouth 3 (three) times daily as needed for anxiety. 30 tablet 0  . LORazepam (ATIVAN) 0.5 MG tablet Take 0.5 mg by mouth See admin instructions. Take one tablet (0.5 mg) by mouth daily at bedtime, may also take one tablet (0.5 mg) during the day as needed for anxiety    . magnesium oxide (MAG-OX) 400 (241.3 Mg) MG tablet Take 1 tablet (400 mg total) by mouth daily. 30 tablet 0  . metFORMIN (GLUCOPHAGE) 1000 MG tablet Take 1,000 mg by mouth daily.    . metoprolol tartrate (LOPRESSOR) 50 MG tablet Take 1 tablet (50 mg total) by mouth 2 (two) times daily. 60 tablet 0  . omeprazole (PRILOSEC) 20 MG capsule Take 20 mg by mouth daily.     . polyethylene glycol (MIRALAX / GLYCOLAX) packet Take 17 g by mouth daily as needed for mild constipation. 14 each 0  . potassium chloride (K-DUR,KLOR-CON) 10 MEQ tablet Take 2 tablets (20 mEq total) by mouth daily. 30 tablet 0  . Rivaroxaban (XARELTO) 15 MG TABS tablet Take 15 mg by mouth daily with  supper.    Marland Kitchen atorvastatin (LIPITOR) 20 MG tablet Take 20 mg by mouth every evening.     No current facility-administered medications for this visit.     Physical Exam: Vitals:   06/25/17 1409  BP: 126/80  Pulse: (!) 55  Weight: 112 lb (50.8 kg)  Height: 5\' 7"  (1.702 m)    GEN- The patient is elderly appearing, alert and oriented x 3 today.   Head- normocephalic, atraumatic Eyes-  Sclera clear, conjunctiva pink Ears- hearing intact Oropharynx- clear Lungs- Clear to ausculation bilaterally, normal work of breathing Heart- Regular rate and rhythm, no murmurs, rubs or gallops, PMI not laterally  displaced GI- soft, NT, ND, + BS Extremities- no clubbing, cyanosis, or edema  ILR interrogation- reviewed in detail today,  See PACEART report  ekg tracing ordered today is personally reviewed and shows sinus bradycardia 55 bpm, PR 204 msec, LVH, Qtc 493 msec  Assessment and Plan:  1. Persistent atrial fibrillation S/p recent cardioversion Now maintaining sinus rhythm (AF burden is 31.3%), afib burden 6.1% last visit Sotalol has been switched to amiodarone recently by Dr Curt Bears See Claudia Desanctis Art report No changes today chads2vasc score is 6.  Continue on xarelto She has had some sinus bradycardia.  Reduce metoprolol to 25mg  BID today  2. HTN Stable No change required today  3. S/p AVR Per Dr Meryle Ready Follow-up in AF clinic in 4 weeks I will see in 3 months  [name] MD, [facility] 06/25/2017 2:30 PM

## 2017-06-27 LAB — CUP PACEART REMOTE DEVICE CHECK
Implantable Pulse Generator Implant Date: 20160407
MDC IDC SESS DTM: 20190212233841

## 2017-07-09 ENCOUNTER — Ambulatory Visit (INDEPENDENT_AMBULATORY_CARE_PROVIDER_SITE_OTHER): Payer: Medicare Other | Admitting: *Deleted

## 2017-07-09 DIAGNOSIS — I480 Paroxysmal atrial fibrillation: Secondary | ICD-10-CM | POA: Diagnosis not present

## 2017-07-09 NOTE — Progress Notes (Signed)
Carelink Summary Report / Loop Recorder 

## 2017-07-16 ENCOUNTER — Telehealth: Payer: Self-pay | Admitting: *Deleted

## 2017-07-16 NOTE — Telephone Encounter (Signed)
Spoke with patient's son, [name] ([place]), as both numbers on file are his.  Advised of LINQ at [facility] as of 07/06/17.  Patient is already scheduled with Roderic Palau, NP, on 07/24/17, and with Dr. Rayann Heman on 09/26/17, so will plan to discuss at upcoming appointment.  [name] feels that patient may benefit from another ILR for AF management.  Patient's son will relay information to patient and call back if she has any questions.

## 2017-07-19 NOTE — Telephone Encounter (Signed)
Message sent to Roderic Palau, NP, to make her aware of RRT.  Plan to discuss at upcoming appointment.  Will await outcome of that discussion prior to unenrolling from [facility].

## 2017-07-24 ENCOUNTER — Encounter (HOSPITAL_COMMUNITY): Payer: Self-pay | Admitting: Nurse Practitioner

## 2017-07-24 ENCOUNTER — Ambulatory Visit (HOSPITAL_COMMUNITY)
Admission: RE | Admit: 2017-07-24 | Discharge: 2017-07-24 | Disposition: A | Discharge status: Home / Self Care | Payer: Medicare Other | Source: Ambulatory Visit | Attending: Nurse Practitioner | Admitting: Nurse Practitioner

## 2017-07-24 VITALS — BP 112/64 | HR 49 | Ht 67.0 in | Wt 112.0 lb

## 2017-07-24 DIAGNOSIS — Z7984 Long term (current) use of oral hypoglycemic drugs: Secondary | ICD-10-CM | POA: Diagnosis not present

## 2017-07-24 DIAGNOSIS — J449 Chronic obstructive pulmonary disease, unspecified: Secondary | ICD-10-CM | POA: Diagnosis not present

## 2017-07-24 DIAGNOSIS — Z79899 Other long term (current) drug therapy: Secondary | ICD-10-CM | POA: Diagnosis not present

## 2017-07-24 DIAGNOSIS — Z885 Allergy status to narcotic agent status: Secondary | ICD-10-CM | POA: Insufficient documentation

## 2017-07-24 DIAGNOSIS — Z9049 Acquired absence of other specified parts of digestive tract: Secondary | ICD-10-CM | POA: Diagnosis not present

## 2017-07-24 DIAGNOSIS — Z823 Family history of stroke: Secondary | ICD-10-CM | POA: Insufficient documentation

## 2017-07-24 DIAGNOSIS — Z8249 Family history of ischemic heart disease and other diseases of the circulatory system: Secondary | ICD-10-CM | POA: Diagnosis not present

## 2017-07-24 DIAGNOSIS — I509 Heart failure, unspecified: Secondary | ICD-10-CM | POA: Insufficient documentation

## 2017-07-24 DIAGNOSIS — Z950 Presence of cardiac pacemaker: Secondary | ICD-10-CM | POA: Insufficient documentation

## 2017-07-24 DIAGNOSIS — Z9889 Other specified postprocedural states: Secondary | ICD-10-CM | POA: Insufficient documentation

## 2017-07-24 DIAGNOSIS — Z7901 Long term (current) use of anticoagulants: Secondary | ICD-10-CM | POA: Insufficient documentation

## 2017-07-24 DIAGNOSIS — I110 Hypertensive heart disease with heart failure: Secondary | ICD-10-CM | POA: Diagnosis not present

## 2017-07-24 DIAGNOSIS — I480 Paroxysmal atrial fibrillation: Secondary | ICD-10-CM | POA: Diagnosis present

## 2017-07-24 DIAGNOSIS — Z801 Family history of malignant neoplasm of trachea, bronchus and lung: Secondary | ICD-10-CM | POA: Insufficient documentation

## 2017-07-24 DIAGNOSIS — E119 Type 2 diabetes mellitus without complications: Secondary | ICD-10-CM | POA: Insufficient documentation

## 2017-07-24 LAB — CBC
HEMATOCRIT: 32.5 % — AB (ref 36.0–46.0)
Hemoglobin: 9.8 g/dL — ABNORMAL LOW (ref 12.0–15.0)
MCH: 26.8 pg (ref 26.0–34.0)
MCHC: 30.2 g/dL (ref 30.0–36.0)
MCV: 89 fL (ref 78.0–100.0)
Platelets: 259 10*3/uL (ref 150–400)
RBC: 3.65 MIL/uL — ABNORMAL LOW (ref 3.87–5.11)
RDW: 15.3 % (ref 11.5–15.5)
WBC: 9.3 10*3/uL (ref 4.0–10.5)

## 2017-07-24 LAB — BASIC METABOLIC PANEL
Anion gap: 9 (ref 5–15)
BUN: 17 mg/dL (ref 6–20)
CO2: 26 mmol/L (ref 22–32)
Calcium: 9.1 mg/dL (ref 8.9–10.3)
Chloride: 97 mmol/L — ABNORMAL LOW (ref 101–111)
Creatinine, Ser: 0.93 mg/dL (ref 0.44–1.00)
GFR calc Af Amer: >60 mL/min (ref >60)
GFR, EST NON AFRICAN AMERICAN: 56 mL/min — AB (ref >60)
GLUCOSE: 108 mg/dL — AB (ref 65–99)
Potassium: 4.4 mmol/L (ref 3.5–5.1)
Sodium: 132 mmol/L — ABNORMAL LOW (ref 135–145)

## 2017-07-24 MED ORDER — METOPROLOL TARTRATE 25 MG PO TABS: 12.5000 mg | ORAL_TABLET | Freq: Two times a day (BID) | ORAL | 3 refills | Status: DC

## 2017-07-24 NOTE — Patient Instructions (Addendum)
Your physician has recommended you make the following change in your medication:  1)Decrease metoprolol to 12.5mg  twice a day (1/2 tablet of the 25mg  tablet I called into the drug store)   Sonia Baller will be in touch with you to schedule linq explant and reimplant.

## 2017-07-24 NOTE — H&P (View-Only) (Signed)
Primary Care Physician: Kelton Pillar, MD Referring Physician: Dr. Leonides Grills Angela Hines is a 84 y.o. female with a h/o afib now on amiodarone, previously on sotalol. She appears to be doing well staying in SR although her HR is slow today. She has a tendency to be inconsistent with her eating and drinking habits and can easily get dehydrated. A nurse that saw her at [facility] yesterday felt she may have been dehydrated and encouraged her to increase her oral intake. Her LInq recently came to  RRT and the family feels it would be beneficial for her to have it replaced.  Today, she denies symptoms of palpitations, chest pain, shortness of breath, orthopnea, PND, lower extremity edema, dizziness, presyncope, syncope, or neurologic sequela. The patient is tolerating medications without difficulties and is otherwise without complaint today.   Past Medical History:  Diagnosis Date  . Atrial fibrillation ([place])   . CHF (congestive heart failure) ([place])   . COPD (chronic obstructive pulmonary disease) ([place])   . Diabetes mellitus without complication ([place])   . High cholesterol   . Hypertension    Past Surgical History:  Procedure Laterality Date  . CARDIAC PACEMAKER PLACEMENT  07/30/2014  . 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 CARDIOVERSION N/A 06/15/2017   Procedure: CARDIOVERSION;  Surgeon: Lelon Perla, MD;  Location: [facility] ENDOSCOPY;  Service: Cardiovascular;  Laterality: N/A;  . CHOLECYSTECTOMY    . EXCISIONAL HEMORRHOIDECTOMY      Current Outpatient Medications  Medication Sig Dispense Refill  . albuterol (PROVENTIL HFA;VENTOLIN HFA) 108 (90 Base) MCG/ACT inhaler Inhale 2 puffs into the lungs every 6 (six) hours as needed for shortness of breath.    Marland Kitchen amiodarone (PACERONE) 200 MG tablet Take 200 mg by mouth daily.    Marland Kitchen bismuth subsalicylate (PEPTO BISMOL) 262 MG/15ML suspension Take 30 mLs by mouth every 6 (six) hours as needed for indigestion.    Marland Kitchen  buPROPion (WELLBUTRIN) 75 MG tablet Take 1 tablet (75 mg total) by mouth 2 (two) times daily. 60 tablet 0  . feeding supplement, ENSURE ENLIVE, (ENSURE ENLIVE) LIQD Take 237 mLs by mouth 3 (three) times daily between meals. 237 mL 12  . fluticasone (FLONASE) 50 MCG/ACT nasal spray Place 1 spray into both nostrils as needed for allergies or rhinitis.    . furosemide (LASIX) 20 MG tablet Take 2 tablets (40 mg total) by mouth daily. 30 tablet 0  . gabapentin (NEURONTIN) 300 MG capsule Take 600 mg by mouth at bedtime.     Marland Kitchen LORazepam (ATIVAN) 0.5 MG tablet Take 0.5 mg by mouth See admin instructions. Take one tablet (0.5 mg) by mouth daily at bedtime, may also take one tablet (0.5 mg) during the day as needed for anxiety    . magnesium oxide (MAG-OX) 400 (241.3 Mg) MG tablet Take 1 tablet (400 mg total) by mouth daily. 30 tablet 0  . metFORMIN (GLUCOPHAGE) 1000 MG tablet Take 1,000 mg by mouth daily.    . metoprolol tartrate (LOPRESSOR) 25 MG tablet Take 0.5 tablets (12.5 mg total) by mouth 2 (two) times daily. 345 tablet 3  . omeprazole (PRILOSEC) 20 MG capsule Take 20 mg by mouth daily.     . polyethylene glycol (MIRALAX / GLYCOLAX) packet Take 17 g by mouth daily as needed for mild constipation. 14 each 0  . potassium chloride (K-DUR,KLOR-CON) 10 MEQ tablet Take 2 tablets (20 mEq total) by mouth daily. 30 tablet 0  .  Rivaroxaban (XARELTO) 15 MG TABS tablet Take 15 mg by mouth daily with supper.     No current facility-administered medications for this encounter.     Allergies  Allergen Reactions  . Clonidine Swelling    "head swelling"  . Captopril Swelling  . Codeine Nausea And Vomiting    GI Intolerance   . Ceclor [Cefaclor] Rash    Social History   Socioeconomic History  . Marital status: Single    Spouse name: Not on file  . Number of children: Not on file  . Years of education: Not on file  . Highest education level: Not on file  Occupational History  . Not on file  Social  Needs  . Financial resource strain: Not on file  . Food insecurity:    Worry: Not on file    Inability: Not on file  . Transportation needs:    Medical: Not on file    Non-medical: Not on file  Tobacco Use  . Smoking status: Never Smoker  . Smokeless tobacco: Never Used  Substance and Sexual Activity  . Alcohol use: No  . Drug use: No  . Sexual activity: Never  Lifestyle  . Physical activity:    Days per week: Not on file    Minutes per session: Not on file  . Stress: Not on file  Relationships  . Social connections:    Talks on phone: Not on file    Gets together: Not on file    Attends religious service: Not on file    Active member of club or organization: Not on file    Attends meetings of clubs or organizations: Not on file    Relationship status: Not on file  . Intimate partner violence:    Fear of current or ex partner: Not on file    Emotionally abused: Not on file    Physically abused: Not on file    Forced sexual activity: Not on file  Other Topics Concern  . Not on file  Social History Narrative  . Not on file    Family History  Problem Relation Age of Onset  . Emphysema Father   . Hypertension Sister   . Lung disease Sister   . Hypertension Brother   . Hypertension Brother   . Stroke Brother   . Other Son        one spleen removed, one with chronic pain from MVA  . Diabetes Son   . Multiple myeloma Son   . Heart disease Son   . Heart attack Son     ROS- All systems are reviewed and negative except as per the HPI above  Physical Exam: Vitals:   07/24/17 1441  BP: 112/64  Pulse: (!) 49  Weight: 112 lb (50.8 kg)  Height: 5' 7"  (1.702 m)   Wt Readings from Last 3 Encounters:  07/24/17 112 lb (50.8 kg)  06/25/17 112 lb (50.8 kg)  06/15/17 114 lb (51.7 kg)    Labs: Lab Results  Component Value Date   NA 132 (L) 07/24/2017   K 4.4 07/24/2017   CL 97 (L) 07/24/2017   CO2 26 07/24/2017   GLUCOSE 108 (H) 07/24/2017   BUN 17 07/24/2017     CREATININE 0.93 07/24/2017   CALCIUM 9.1 07/24/2017   MG 1.9 06/12/2017   No results found for: INR No results found for: CHOL, HDL, LDLCALC, TRIG   GEN- The patient is well appearing, alert and oriented x 3 today.   Head-  normocephalic, atraumatic Eyes-  Sclera clear, conjunctiva pink Ears- hearing intact Oropharynx- clear Neck- supple, no JVP Lymph- no cervical lymphadenopathy Lungs- Clear to ausculation bilaterally, normal work of breathing Heart- Regular rate and rhythm, no murmurs, rubs or gallops, PMI not laterally displaced GI- soft, NT, ND, + BS Extremities- no clubbing, cyanosis, or edema MS- no significant deformity or atrophy Skin- no rash or lesion Psych- euthymic mood, full affect Neuro- strength and sensation are intact  EKG-Sinus brady at 49 bpm, PR int 202 msd, qrs int 112 ms, qtc 513 ms    Assessment and Plan: 1. Paroxysmal afib Continue amiodarone at 200 mg qd Reduce metoprolol to 25 mg 1/2 tab bid for brady Continue xarelto 15 mg qd with chadsvasc score of at least 6 Will be in touch with Dr. Jackalyn Lombard office to get scheduled for explant/implant of Linq Encouraged to increase oral intake and to eat consistent small meals Bmet/cbc today  Butch Penny C. Danylle Ouk, [facility] [address] [place], [place] [address] [phone]

## 2017-07-24 NOTE — Progress Notes (Signed)
Primary Care Physician: Kelton Pillar, MD Referring Physician: Dr. Leonides Grills Omar is a 84 y.o. female with a h/o afib now on amiodarone, previously on sotalol. She appears to be doing well staying in SR although her HR is slow today. She has a tendency to be inconsistent with her eating and drinking habits and can easily get dehydrated. A nurse that saw her at [facility] yesterday felt she may have been dehydrated and encouraged her to increase her oral intake. Her LInq recently came to  RRT and the family feels it would be beneficial for her to have it replaced.  Today, she denies symptoms of palpitations, chest pain, shortness of breath, orthopnea, PND, lower extremity edema, dizziness, presyncope, syncope, or neurologic sequela. The patient is tolerating medications without difficulties and is otherwise without complaint today.   Past Medical History:  Diagnosis Date  . Atrial fibrillation ([place])   . CHF (congestive heart failure) ([place])   . COPD (chronic obstructive pulmonary disease) ([place])   . Diabetes mellitus without complication ([place])   . High cholesterol   . Hypertension    Past Surgical History:  Procedure Laterality Date  . CARDIAC PACEMAKER PLACEMENT  07/30/2014  . 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 CARDIOVERSION N/A 06/15/2017   Procedure: CARDIOVERSION;  Surgeon: Lelon Perla, MD;  Location: [facility] ENDOSCOPY;  Service: Cardiovascular;  Laterality: N/A;  . CHOLECYSTECTOMY    . EXCISIONAL HEMORRHOIDECTOMY      Current Outpatient Medications  Medication Sig Dispense Refill  . albuterol (PROVENTIL HFA;VENTOLIN HFA) 108 (90 Base) MCG/ACT inhaler Inhale 2 puffs into the lungs every 6 (six) hours as needed for shortness of breath.    Marland Kitchen amiodarone (PACERONE) 200 MG tablet Take 200 mg by mouth daily.    Marland Kitchen bismuth subsalicylate (PEPTO BISMOL) 262 MG/15ML suspension Take 30 mLs by mouth every 6 (six) hours as needed for indigestion.    Marland Kitchen  buPROPion (WELLBUTRIN) 75 MG tablet Take 1 tablet (75 mg total) by mouth 2 (two) times daily. 60 tablet 0  . feeding supplement, ENSURE ENLIVE, (ENSURE ENLIVE) LIQD Take 237 mLs by mouth 3 (three) times daily between meals. 237 mL 12  . fluticasone (FLONASE) 50 MCG/ACT nasal spray Place 1 spray into both nostrils as needed for allergies or rhinitis.    . furosemide (LASIX) 20 MG tablet Take 2 tablets (40 mg total) by mouth daily. 30 tablet 0  . gabapentin (NEURONTIN) 300 MG capsule Take 600 mg by mouth at bedtime.     Marland Kitchen LORazepam (ATIVAN) 0.5 MG tablet Take 0.5 mg by mouth See admin instructions. Take one tablet (0.5 mg) by mouth daily at bedtime, may also take one tablet (0.5 mg) during the day as needed for anxiety    . magnesium oxide (MAG-OX) 400 (241.3 Mg) MG tablet Take 1 tablet (400 mg total) by mouth daily. 30 tablet 0  . metFORMIN (GLUCOPHAGE) 1000 MG tablet Take 1,000 mg by mouth daily.    . metoprolol tartrate (LOPRESSOR) 25 MG tablet Take 0.5 tablets (12.5 mg total) by mouth 2 (two) times daily. 345 tablet 3  . omeprazole (PRILOSEC) 20 MG capsule Take 20 mg by mouth daily.     . polyethylene glycol (MIRALAX / GLYCOLAX) packet Take 17 g by mouth daily as needed for mild constipation. 14 each 0  . potassium chloride (K-DUR,KLOR-CON) 10 MEQ tablet Take 2 tablets (20 mEq total) by mouth daily. 30 tablet 0  .  Rivaroxaban (XARELTO) 15 MG TABS tablet Take 15 mg by mouth daily with supper.     No current facility-administered medications for this encounter.     Allergies  Allergen Reactions  . Clonidine Swelling    "head swelling"  . Captopril Swelling  . Codeine Nausea And Vomiting    GI Intolerance   . Ceclor [Cefaclor] Rash    Social History   Socioeconomic History  . Marital status: Single    Spouse name: Not on file  . Number of children: Not on file  . Years of education: Not on file  . Highest education level: Not on file  Occupational History  . Not on file  Social  Needs  . Financial resource strain: Not on file  . Food insecurity:    Worry: Not on file    Inability: Not on file  . Transportation needs:    Medical: Not on file    Non-medical: Not on file  Tobacco Use  . Smoking status: Never Smoker  . Smokeless tobacco: Never Used  Substance and Sexual Activity  . Alcohol use: No  . Drug use: No  . Sexual activity: Never  Lifestyle  . Physical activity:    Days per week: Not on file    Minutes per session: Not on file  . Stress: Not on file  Relationships  . Social connections:    Talks on phone: Not on file    Gets together: Not on file    Attends religious service: Not on file    Active member of club or organization: Not on file    Attends meetings of clubs or organizations: Not on file    Relationship status: Not on file  . Intimate partner violence:    Fear of current or ex partner: Not on file    Emotionally abused: Not on file    Physically abused: Not on file    Forced sexual activity: Not on file  Other Topics Concern  . Not on file  Social History Narrative  . Not on file    Family History  Problem Relation Age of Onset  . Emphysema Father   . Hypertension Sister   . Lung disease Sister   . Hypertension Brother   . Hypertension Brother   . Stroke Brother   . Other Son        one spleen removed, one with chronic pain from MVA  . Diabetes Son   . Multiple myeloma Son   . Heart disease Son   . Heart attack Son     ROS- All systems are reviewed and negative except as per the HPI above  Physical Exam: Vitals:   07/24/17 1441  BP: 112/64  Pulse: (!) 49  Weight: 112 lb (50.8 kg)  Height: 5' 7"  (1.702 m)   Wt Readings from Last 3 Encounters:  07/24/17 112 lb (50.8 kg)  06/25/17 112 lb (50.8 kg)  06/15/17 114 lb (51.7 kg)    Labs: Lab Results  Component Value Date   NA 132 (L) 07/24/2017   K 4.4 07/24/2017   CL 97 (L) 07/24/2017   CO2 26 07/24/2017   GLUCOSE 108 (H) 07/24/2017   BUN 17 07/24/2017     CREATININE 0.93 07/24/2017   CALCIUM 9.1 07/24/2017   MG 1.9 06/12/2017   No results found for: INR No results found for: CHOL, HDL, LDLCALC, TRIG   GEN- The patient is well appearing, alert and oriented x 3 today.   Head-  normocephalic, atraumatic Eyes-  Sclera clear, conjunctiva pink Ears- hearing intact Oropharynx- clear Neck- supple, no JVP Lymph- no cervical lymphadenopathy Lungs- Clear to ausculation bilaterally, normal work of breathing Heart- Regular rate and rhythm, no murmurs, rubs or gallops, PMI not laterally displaced GI- soft, NT, ND, + BS Extremities- no clubbing, cyanosis, or edema MS- no significant deformity or atrophy Skin- no rash or lesion Psych- euthymic mood, full affect Neuro- strength and sensation are intact  EKG-Sinus brady at 49 bpm, PR int 202 msd, qrs int 112 ms, qtc 513 ms    Assessment and Plan: 1. Paroxysmal afib Continue amiodarone at 200 mg qd Reduce metoprolol to 25 mg 1/2 tab bid for brady Continue xarelto 15 mg qd with chadsvasc score of at least 6 Will be in touch with Dr. Jackalyn Lombard office to get scheduled for explant/implant of Linq Encouraged to increase oral intake and to eat consistent small meals Bmet/cbc today  Butch Penny C. Rosemaria Inabinet, [facility] [address] [place], [place] [address] [phone]

## 2017-07-25 ENCOUNTER — Telehealth: Payer: Self-pay

## 2017-07-25 NOTE — Telephone Encounter (Signed)
Spoke with pt's daughter in [employer]. Linq removal and new insertion scheduled for May 1st at 0830 with a 0730 arrival. Pt's daughter in law educated on where to check in the morning of her procedure. She had no additional questions.

## 2017-07-31 ENCOUNTER — Other Ambulatory Visit (HOSPITAL_COMMUNITY): Payer: Self-pay | Admitting: *Deleted

## 2017-07-31 MED ORDER — AMIODARONE HCL 200 MG PO TABS: 200.0000 mg | ORAL_TABLET | Freq: Every day | ORAL | 2 refills | Status: DC

## 2017-08-01 ENCOUNTER — Other Ambulatory Visit: Payer: Self-pay | Admitting: Internal Medicine

## 2017-08-10 ENCOUNTER — Ambulatory Visit (INDEPENDENT_AMBULATORY_CARE_PROVIDER_SITE_OTHER): Payer: Medicare Other | Admitting: *Deleted

## 2017-08-10 DIAGNOSIS — I480 Paroxysmal atrial fibrillation: Secondary | ICD-10-CM

## 2017-08-13 NOTE — Progress Notes (Signed)
Carelink Summary Report / Loop Recorder 

## 2017-08-18 LAB — CUP PACEART REMOTE DEVICE CHECK
Date Time Interrogation Session: 20190318004111
Implantable Pulse Generator Implant Date: 20160407

## 2017-08-22 ENCOUNTER — Encounter (HOSPITAL_COMMUNITY)
Admission: RE | Disposition: A | Discharge status: Home / Self Care | Payer: Self-pay | Source: Ambulatory Visit | Attending: Internal Medicine

## 2017-08-22 ENCOUNTER — Ambulatory Visit (HOSPITAL_COMMUNITY)
Admission: RE | Admit: 2017-08-22 | Discharge: 2017-08-22 | Disposition: A | Discharge status: Home / Self Care | Payer: Medicare Other | Source: Ambulatory Visit | Attending: Internal Medicine | Admitting: Internal Medicine

## 2017-08-22 ENCOUNTER — Encounter (HOSPITAL_COMMUNITY): Payer: Self-pay | Admitting: Internal Medicine

## 2017-08-22 DIAGNOSIS — Z9049 Acquired absence of other specified parts of digestive tract: Secondary | ICD-10-CM | POA: Diagnosis not present

## 2017-08-22 DIAGNOSIS — J449 Chronic obstructive pulmonary disease, unspecified: Secondary | ICD-10-CM | POA: Diagnosis not present

## 2017-08-22 DIAGNOSIS — I509 Heart failure, unspecified: Secondary | ICD-10-CM | POA: Insufficient documentation

## 2017-08-22 DIAGNOSIS — Z9889 Other specified postprocedural states: Secondary | ICD-10-CM | POA: Insufficient documentation

## 2017-08-22 DIAGNOSIS — Z833 Family history of diabetes mellitus: Secondary | ICD-10-CM | POA: Insufficient documentation

## 2017-08-22 DIAGNOSIS — Z950 Presence of cardiac pacemaker: Secondary | ICD-10-CM | POA: Insufficient documentation

## 2017-08-22 DIAGNOSIS — Z79899 Other long term (current) drug therapy: Secondary | ICD-10-CM | POA: Insufficient documentation

## 2017-08-22 DIAGNOSIS — Z823 Family history of stroke: Secondary | ICD-10-CM | POA: Insufficient documentation

## 2017-08-22 DIAGNOSIS — Z885 Allergy status to narcotic agent status: Secondary | ICD-10-CM | POA: Diagnosis not present

## 2017-08-22 DIAGNOSIS — E7800 Pure hypercholesterolemia, unspecified: Secondary | ICD-10-CM | POA: Diagnosis not present

## 2017-08-22 DIAGNOSIS — I480 Paroxysmal atrial fibrillation: Secondary | ICD-10-CM | POA: Diagnosis not present

## 2017-08-22 DIAGNOSIS — Z7984 Long term (current) use of oral hypoglycemic drugs: Secondary | ICD-10-CM | POA: Insufficient documentation

## 2017-08-22 DIAGNOSIS — E119 Type 2 diabetes mellitus without complications: Secondary | ICD-10-CM | POA: Insufficient documentation

## 2017-08-22 DIAGNOSIS — R002 Palpitations: Secondary | ICD-10-CM | POA: Diagnosis not present

## 2017-08-22 DIAGNOSIS — Z8249 Family history of ischemic heart disease and other diseases of the circulatory system: Secondary | ICD-10-CM | POA: Insufficient documentation

## 2017-08-22 DIAGNOSIS — Z7901 Long term (current) use of anticoagulants: Secondary | ICD-10-CM | POA: Diagnosis not present

## 2017-08-22 DIAGNOSIS — I4891 Unspecified atrial fibrillation: Secondary | ICD-10-CM | POA: Diagnosis present

## 2017-08-22 DIAGNOSIS — Z881 Allergy status to other antibiotic agents status: Secondary | ICD-10-CM | POA: Insufficient documentation

## 2017-08-22 DIAGNOSIS — I110 Hypertensive heart disease with heart failure: Secondary | ICD-10-CM | POA: Insufficient documentation

## 2017-08-22 HISTORY — PX: LOOP RECORDER INSERTION: EP1214

## 2017-08-22 HISTORY — PX: LOOP RECORDER REMOVAL: EP1215

## 2017-08-22 LAB — GLUCOSE, CAPILLARY: GLUCOSE-CAPILLARY: 141 mg/dL — AB (ref 65–99)

## 2017-08-22 SURGERY — LOOP RECORDER REMOVAL

## 2017-08-22 MED ORDER — LIDOCAINE-EPINEPHRINE 1 %-1:100000 IJ SOLN
INTRAMUSCULAR | Status: DC | PRN
  Administered 2017-08-22: 20 mL

## 2017-08-22 MED ORDER — LIDOCAINE-EPINEPHRINE 1 %-1:100000 IJ SOLN
INTRAMUSCULAR | Status: AC
  Filled 2017-08-22: qty 1

## 2017-08-22 SURGICAL SUPPLY — 2 items
LOOP REVEAL LINQSYS (Prosthesis & Implant Heart) ×3 IMPLANT
PACK LOOP INSERTION (CUSTOM PROCEDURE TRAY) ×3 IMPLANT

## 2017-08-22 NOTE — Interval H&P Note (Signed)
History and Physical Interval Note:  08/22/2017 8:47 AM  Angela Hines  has presented today for surgery, with the diagnosis of afib  The various methods of treatment have been discussed with the patient and family. After consideration of risks, benefits and other options for treatment, the patient has consented to  Procedure(s): LOOP RECORDER REMOVAL (N/A) LOOP RECORDER INSERTION (N/A) as a surgical intervention .  The patient's history has been reviewed, patient examined, no change in status, stable for surgery.  I have reviewed the patient's chart and labs.  Questions were answered to the patient's satisfaction.    Risks and benefits to ILR removal with new device implantation were discussed at length with patient and family today who wish to proceed.  Angela Hines

## 2017-09-06 ENCOUNTER — Ambulatory Visit: Payer: Medicare Other

## 2017-09-06 LAB — CUP PACEART REMOTE DEVICE CHECK
Date Time Interrogation Session: 20190420013600
MDC IDC PG IMPLANT DT: 20160407

## 2017-09-08 ENCOUNTER — Other Ambulatory Visit: Payer: Self-pay

## 2017-09-08 ENCOUNTER — Emergency Department (HOSPITAL_BASED_OUTPATIENT_CLINIC_OR_DEPARTMENT_OTHER): Payer: Medicare Other

## 2017-09-08 ENCOUNTER — Emergency Department (HOSPITAL_BASED_OUTPATIENT_CLINIC_OR_DEPARTMENT_OTHER)
Admission: EM | Admit: 2017-09-08 | Discharge: 2017-09-08 | Disposition: A | Discharge status: Home / Self Care | Payer: Medicare Other | Attending: Emergency Medicine | Admitting: Emergency Medicine

## 2017-09-08 ENCOUNTER — Encounter (HOSPITAL_BASED_OUTPATIENT_CLINIC_OR_DEPARTMENT_OTHER): Payer: Self-pay | Admitting: *Deleted

## 2017-09-08 DIAGNOSIS — Z7984 Long term (current) use of oral hypoglycemic drugs: Secondary | ICD-10-CM | POA: Insufficient documentation

## 2017-09-08 DIAGNOSIS — Y999 Unspecified external cause status: Secondary | ICD-10-CM | POA: Diagnosis not present

## 2017-09-08 DIAGNOSIS — Y9389 Activity, other specified: Secondary | ICD-10-CM | POA: Diagnosis not present

## 2017-09-08 DIAGNOSIS — I110 Hypertensive heart disease with heart failure: Secondary | ICD-10-CM | POA: Diagnosis not present

## 2017-09-08 DIAGNOSIS — Y929 Unspecified place or not applicable: Secondary | ICD-10-CM | POA: Diagnosis not present

## 2017-09-08 DIAGNOSIS — E119 Type 2 diabetes mellitus without complications: Secondary | ICD-10-CM | POA: Diagnosis not present

## 2017-09-08 DIAGNOSIS — J449 Chronic obstructive pulmonary disease, unspecified: Secondary | ICD-10-CM | POA: Diagnosis not present

## 2017-09-08 DIAGNOSIS — S0083XA Contusion of other part of head, initial encounter: Secondary | ICD-10-CM | POA: Insufficient documentation

## 2017-09-08 DIAGNOSIS — Z7901 Long term (current) use of anticoagulants: Secondary | ICD-10-CM | POA: Diagnosis not present

## 2017-09-08 DIAGNOSIS — W19XXXA Unspecified fall, initial encounter: Secondary | ICD-10-CM

## 2017-09-08 DIAGNOSIS — I5032 Chronic diastolic (congestive) heart failure: Secondary | ICD-10-CM | POA: Insufficient documentation

## 2017-09-08 DIAGNOSIS — Z950 Presence of cardiac pacemaker: Secondary | ICD-10-CM | POA: Diagnosis not present

## 2017-09-08 DIAGNOSIS — W0110XA Fall on same level from slipping, tripping and stumbling with subsequent striking against unspecified object, initial encounter: Secondary | ICD-10-CM | POA: Diagnosis not present

## 2017-09-08 DIAGNOSIS — S098XXA Other specified injuries of head, initial encounter: Secondary | ICD-10-CM | POA: Diagnosis present

## 2017-09-08 NOTE — ED Notes (Signed)
Pt on auto VS  

## 2017-09-08 NOTE — ED Notes (Signed)
ED Provider at bedside. 

## 2017-09-08 NOTE — ED Triage Notes (Signed)
Pt states that she fell 2 days ago. Significant bruising to face, bilateral eyes.  Pt also noted to have multiple abrasions to left hand.

## 2017-09-08 NOTE — ED Provider Notes (Signed)
[facility] EMERGENCY DEPARTMENT Provider Note   CSN: [id number] Arrival date & time: 09/08/17  1613     History   Chief Complaint Chief Complaint  Patient presents with  . Fall    HPI Angela Hines is a 84 y.o. female.  The history is provided by the patient and a relative. No language interpreter was used.  Fall     Angela Hines is a 84 y.o. female who presents to the Emergency Department complaining of fall.  She was visiting her sister in [place] for the last week.  On Thursday she woke and slipped on the hardwood floor and struck her forehead.  She denies any LOC, headache, N/V, neck pain, chest pain, sob.  Her son picked her up this morning and noticed significant bruising to her face and brought her in for evaluation.  She does take Xarelto for atrial fibrillation.    Past Medical History:  Diagnosis Date  . Atrial fibrillation ([place])   . CHF (congestive heart failure) ([place])   . COPD (chronic obstructive pulmonary disease) ([place])   . Diabetes mellitus without complication ([place])   . High cholesterol   . Hypertension     Patient Active Problem List   Diagnosis Date Noted  . Protein-calorie malnutrition, severe 06/11/2017  . Acute on chronic diastolic CHF (congestive heart failure) ([place])   . Prolonged QT interval   . Sepsis ([place]) 05/15/2017  . Acute and chronic respiratory failure with hypoxia ([place]) 05/15/2017  . Healthcare-associated pneumonia 05/15/2017  . Community acquired pneumonia 05/15/2017  . Acute respiratory failure with hypoxia ([place]) 05/15/2017  . COPD with acute exacerbation ([place]) 07/17/2016  . Diabetes mellitus ([place]) 07/17/2016  . Gastroesophageal reflux disease without esophagitis 07/17/2016  . Generalized anxiety disorder 07/17/2016  . Hypercholesterolemia 07/17/2016  . Hypertension 07/17/2016  . Status post placement of implantable loop recorder 07/17/2016  . AF (paroxysmal atrial fibrillation) ([place]) 07/17/2016  . Primary  insomnia 07/17/2016  . S/P AVR (aortic valve replacement) 07/17/2016  . History of syncope 08/07/2014  . Atrial fibrillation with RVR ([place]) 07/29/2014    Past Surgical History:  Procedure Laterality Date  . CARDIAC PACEMAKER PLACEMENT  07/30/2014  . 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 CARDIOVERSION N/A 06/15/2017   Procedure: CARDIOVERSION;  Surgeon: Lelon Perla, MD;  Location: [facility] ENDOSCOPY;  Service: Cardiovascular;  Laterality: N/A;  . CHOLECYSTECTOMY    . EXCISIONAL HEMORRHOIDECTOMY    . LOOP RECORDER INSERTION N/A 08/22/2017   Procedure: LOOP RECORDER INSERTION;  Surgeon: [name], MD;  Location: [facility] LAB;  Service: Cardiovascular;  Laterality: N/A;  . LOOP RECORDER REMOVAL N/A 08/22/2017   Procedure: LOOP RECORDER REMOVAL;  Surgeon: [name], MD;  Location: [facility];  Service: Cardiovascular;  Laterality: N/A;     OB History   None      Home Medications    Prior to Admission medications   Medication Sig Start Date End Date Taking? Authorizing Provider  albuterol (PROVENTIL HFA;VENTOLIN HFA) 108 (90 Base) MCG/ACT inhaler Inhale 2 puffs into the lungs every 6 (six) hours as needed for shortness of breath.    [provider]  amiodarone (PACERONE) 200 MG tablet Take 1 tablet (200 mg total) by mouth daily. 07/31/17   Sherran Needs, NP  bismuth subsalicylate (PEPTO BISMOL) 262 MG/15ML suspension Take 30 mLs by mouth every 6 (six) hours as needed for indigestion.    [provider]  buPROPion (  WELLBUTRIN XL) 150 MG 24 hr tablet Take 150 mg by mouth daily. 06/20/17   [provider]  feeding supplement, ENSURE ENLIVE, (ENSURE ENLIVE) LIQD Take 237 mLs by mouth 3 (three) times daily between meals. Patient taking differently: Take 237 mLs by mouth daily.  06/12/17   Lavina Hamman, MD  fluticasone Asencion Islam) 50 MCG/ACT nasal spray Place 1 spray into both nostrils as needed for allergies or rhinitis.     [provider]  furosemide (LASIX) 20 MG tablet Take 2 tablets (40 mg total) by mouth daily. 06/12/17   Lavina Hamman, MD  gabapentin (NEURONTIN) 300 MG capsule Take 600 mg by mouth at bedtime.  07/17/16   [provider]  LORazepam (ATIVAN) 0.5 MG tablet Take 0.5 mg by mouth at bedtime.  10/26/16   [provider]  magnesium oxide (MAG-OX) 400 (241.3 Mg) MG tablet Take 1 tablet (400 mg total) by mouth daily. 05/24/17   Nita Sells, MD  metFORMIN (GLUCOPHAGE) 1000 MG tablet Take 1,000 mg by mouth daily. 12/01/16   [provider]  metoprolol tartrate (LOPRESSOR) 25 MG tablet Take 0.5 tablets (12.5 mg total) by mouth 2 (two) times daily. 07/24/17 10/22/17  Sherran Needs, NP  omeprazole (PRILOSEC) 40 MG capsule Take 40 mg by mouth daily.     [provider]  polyethylene glycol (MIRALAX / GLYCOLAX) packet Take 17 g by mouth daily as needed for mild constipation. 06/12/17   Lavina Hamman, MD  potassium chloride (K-DUR,KLOR-CON) 10 MEQ tablet Take 2 tablets (20 mEq total) by mouth daily. 06/12/17   Lavina Hamman, MD  Rivaroxaban (XARELTO) 15 MG TABS tablet Take 15 mg by mouth daily with supper. 07/17/16   [provider]    Family History Family History  Problem Relation Age of Onset  . Emphysema Father   . Hypertension Sister   . Lung disease Sister   . Hypertension Brother   . Hypertension Brother   . Stroke Brother   . Other Son        one spleen removed, one with chronic pain from MVA  . Diabetes Son   . Multiple myeloma Son   . Heart disease Son   . Heart attack Son     Social History Social History   Tobacco Use  . Smoking status: Never Smoker  . Smokeless tobacco: Never Used  Substance Use Topics  . Alcohol use: No  . Drug use: No     Allergies   Clonidine; Captopril; Codeine; and Ceclor [cefaclor]   Review of Systems Review of Systems  All other systems reviewed and are negative.    Physical  Exam Updated Vital Signs BP (!) 170/84 (BP Location: Right Arm)   Pulse 68   Temp 98.5 F (36.9 C) (Oral)   Resp 16   Ht 5' 7"  (1.702 m)   Wt 49.9 kg (110 lb)   SpO2 98%   BMI 17.23 kg/m   Physical Exam  Constitutional: She is oriented to person, place, and time. She appears well-developed and well-nourished.  HENT:  Head: Normocephalic.  Large hematoma to the left forehead. Significant ecchymosis across the forehead and around bilateral orbits extending to the upper lip. There is mild tenderness over the left forehead hematoma, no additional facial tenderness.  Eyes: Pupils are equal, round, and reactive to light. EOM are normal.  Neck: Neck supple.  Cardiovascular: Normal rate and regular rhythm.  Murmur heard. SEM  Pulmonary/Chest: Effort normal and breath sounds  normal. No respiratory distress.  Abdominal: Soft. There is no tenderness. There is no rebound and no guarding.  Musculoskeletal: She exhibits no edema or tenderness.  Healing abrasions to the dorsal left hand without any local tenderness. Full range of motion intact throughout the hand.  Neurological: She is alert and oriented to person, place, and time.  Five out of five strength in all four extremities with sensation to light touch intact in all four extremities. Slight resting tremor in the left upper extremity (patient states this is chronic). No facial asymmetry.  Skin: Skin is warm and dry.  Psychiatric: She has a normal mood and affect. Her behavior is normal.  Nursing note and vitals reviewed.    ED Treatments / Results  Labs (all labs ordered are listed, but only abnormal results are displayed) Labs Reviewed - No data to display  EKG None  Radiology Ct Head Wo Contrast  Result Date: 09/08/2017 CLINICAL DATA:  Pt fell 2 days ago, left facial bruising and hematoma to left side of head, on blood thinners, denies headache/dizziness/blurred vision/nausea, fell forward onto hardwood floor No hx CVA EXAM:  CT HEAD WITHOUT CONTRAST CT CERVICAL SPINE WITHOUT CONTRAST TECHNIQUE: Multidetector CT imaging of the head and cervical spine was performed following the standard protocol without intravenous contrast. Multiplanar CT image reconstructions of the cervical spine were also generated. COMPARISON:  None. FINDINGS: CT HEAD FINDINGS Brain: No acute intracranial hemorrhage. No focal mass lesion. No CT evidence of acute infarction. No midline shift or mass effect. No hydrocephalus. Basilar cisterns are patent. Vascular: No hyperdense vessel or unexpected calcification. Skull: Normal. Negative for fracture or focal lesion. Sinuses/Orbits: Paranasal sinuses and mastoid air cells are clear. Orbits are clear. Other: Small scalp hematoma over the central LEFT frontal bone measures 6 mm in depth. No associated skull fracture. CT CERVICAL SPINE FINDINGS Alignment: Normal alignment of the cervical vertebral bodies. Skull base and vertebrae: Normal craniocervical junction. No loss of vertebral body height or disc height. Normal facet articulation. No evidence of fracture. Soft tissues and spinal canal: No prevertebral soft tissue swelling. No perispinal or epidural hematoma. Disc levels: Endplate spurring and joint space narrowing at C6-C7. Multiple levels of uncovertebral hypertrophy and facet hypertrophy. Upper chest: Clear Other: There is interstitial thickening in the upper lobes and apical nodularity which is similar to comparison chest radiograph from 06/09/2017 IMPRESSION: 1. No intracranial trauma. 2. Small LEFT frontal scalp hematoma. 3. No skull fracture. 4. No cervical spine fracture 5. Multilevel disc osteophytic disease. 6. Chronic appearing interstitial lung disease in the upper lobes. Findings comparable to chest radiograph 06/09/2017. Electronically Signed   By: Suzy Bouchard M.D.   On: 09/08/2017 17:19   Ct Cervical Spine Wo Contrast  Result Date: 09/08/2017 CLINICAL DATA:  Pt fell 2 days ago, left facial  bruising and hematoma to left side of head, on blood thinners, denies headache/dizziness/blurred vision/nausea, fell forward onto hardwood floor No hx CVA EXAM: CT HEAD WITHOUT CONTRAST CT CERVICAL SPINE WITHOUT CONTRAST TECHNIQUE: Multidetector CT imaging of the head and cervical spine was performed following the standard protocol without intravenous contrast. Multiplanar CT image reconstructions of the cervical spine were also generated. COMPARISON:  None. FINDINGS: CT HEAD FINDINGS Brain: No acute intracranial hemorrhage. No focal mass lesion. No CT evidence of acute infarction. No midline shift or mass effect. No hydrocephalus. Basilar cisterns are patent. Vascular: No hyperdense vessel or unexpected calcification. Skull: Normal. Negative for fracture or focal lesion. Sinuses/Orbits: Paranasal sinuses and mastoid air cells  are clear. Orbits are clear. Other: Small scalp hematoma over the central LEFT frontal bone measures 6 mm in depth. No associated skull fracture. CT CERVICAL SPINE FINDINGS Alignment: Normal alignment of the cervical vertebral bodies. Skull base and vertebrae: Normal craniocervical junction. No loss of vertebral body height or disc height. Normal facet articulation. No evidence of fracture. Soft tissues and spinal canal: No prevertebral soft tissue swelling. No perispinal or epidural hematoma. Disc levels: Endplate spurring and joint space narrowing at C6-C7. Multiple levels of uncovertebral hypertrophy and facet hypertrophy. Upper chest: Clear Other: There is interstitial thickening in the upper lobes and apical nodularity which is similar to comparison chest radiograph from 06/09/2017 IMPRESSION: 1. No intracranial trauma. 2. Small LEFT frontal scalp hematoma. 3. No skull fracture. 4. No cervical spine fracture 5. Multilevel disc osteophytic disease. 6. Chronic appearing interstitial lung disease in the upper lobes. Findings comparable to chest radiograph 06/09/2017. Electronically Signed    By: Suzy Bouchard M.D.   On: 09/08/2017 17:19    Procedures Procedures (including critical care time)  Medications Ordered in ED Medications - No data to display   Initial Impression / Assessment and Plan / ED Course  I have reviewed the triage vital signs and the nursing notes.  Pertinent labs & imaging results that were available during my care of the patient were reviewed by me and considered in my medical decision making (see chart for details).     Patient here for evaluation of injuries following a mechanical fall several days ago. She does have marked ecchymosis over her face as well as forehead hematoma. Given her anticoagulation CT head and neck obtained. CT is negative for acute intracranial abnormality. Discussed with patient home care following fall. Discussed outpatient follow-up and return precautions.  Final Clinical Impressions(s) / ED Diagnoses   Final diagnoses:  Fall, initial encounter  Contusion of face, initial encounter    ED Discharge Orders    None       Quintella Reichert, MD 09/09/17 [phone]

## 2017-09-08 NOTE — ED Notes (Signed)
Patient transported to CT 

## 2017-09-10 ENCOUNTER — Observation Stay (HOSPITAL_COMMUNITY)
Admission: EM | Admit: 2017-09-10 | Discharge: 2017-09-11 | Disposition: A | Discharge status: Home / Self Care | Payer: Medicare Other | Attending: Internal Medicine | Admitting: Internal Medicine

## 2017-09-10 ENCOUNTER — Other Ambulatory Visit: Payer: Self-pay

## 2017-09-10 ENCOUNTER — Emergency Department (HOSPITAL_COMMUNITY): Payer: Medicare Other

## 2017-09-10 ENCOUNTER — Encounter (HOSPITAL_COMMUNITY): Payer: Self-pay | Admitting: Emergency Medicine

## 2017-09-10 DIAGNOSIS — Z8673 Personal history of transient ischemic attack (TIA), and cerebral infarction without residual deficits: Secondary | ICD-10-CM | POA: Insufficient documentation

## 2017-09-10 DIAGNOSIS — E872 Acidosis: Secondary | ICD-10-CM | POA: Insufficient documentation

## 2017-09-10 DIAGNOSIS — E860 Dehydration: Secondary | ICD-10-CM | POA: Insufficient documentation

## 2017-09-10 DIAGNOSIS — R0789 Other chest pain: Principal | ICD-10-CM | POA: Insufficient documentation

## 2017-09-10 DIAGNOSIS — Z794 Long term (current) use of insulin: Secondary | ICD-10-CM | POA: Diagnosis not present

## 2017-09-10 DIAGNOSIS — Z952 Presence of prosthetic heart valve: Secondary | ICD-10-CM | POA: Insufficient documentation

## 2017-09-10 DIAGNOSIS — R9431 Abnormal electrocardiogram [ECG] [EKG]: Secondary | ICD-10-CM | POA: Diagnosis not present

## 2017-09-10 DIAGNOSIS — K862 Cyst of pancreas: Secondary | ICD-10-CM | POA: Diagnosis not present

## 2017-09-10 DIAGNOSIS — R079 Chest pain, unspecified: Secondary | ICD-10-CM

## 2017-09-10 DIAGNOSIS — E878 Other disorders of electrolyte and fluid balance, not elsewhere classified: Secondary | ICD-10-CM

## 2017-09-10 DIAGNOSIS — I480 Paroxysmal atrial fibrillation: Secondary | ICD-10-CM | POA: Insufficient documentation

## 2017-09-10 DIAGNOSIS — Z881 Allergy status to other antibiotic agents status: Secondary | ICD-10-CM | POA: Insufficient documentation

## 2017-09-10 DIAGNOSIS — I5022 Chronic systolic (congestive) heart failure: Secondary | ICD-10-CM | POA: Insufficient documentation

## 2017-09-10 DIAGNOSIS — Z885 Allergy status to narcotic agent status: Secondary | ICD-10-CM | POA: Insufficient documentation

## 2017-09-10 DIAGNOSIS — Z7901 Long term (current) use of anticoagulants: Secondary | ICD-10-CM | POA: Insufficient documentation

## 2017-09-10 DIAGNOSIS — K7689 Other specified diseases of liver: Secondary | ICD-10-CM | POA: Diagnosis not present

## 2017-09-10 DIAGNOSIS — D1800 Hemangioma unspecified site: Secondary | ICD-10-CM | POA: Diagnosis not present

## 2017-09-10 DIAGNOSIS — I110 Hypertensive heart disease with heart failure: Secondary | ICD-10-CM | POA: Diagnosis not present

## 2017-09-10 DIAGNOSIS — R072 Precordial pain: Secondary | ICD-10-CM

## 2017-09-10 DIAGNOSIS — F411 Generalized anxiety disorder: Secondary | ICD-10-CM | POA: Diagnosis not present

## 2017-09-10 DIAGNOSIS — E119 Type 2 diabetes mellitus without complications: Secondary | ICD-10-CM | POA: Diagnosis not present

## 2017-09-10 DIAGNOSIS — Z950 Presence of cardiac pacemaker: Secondary | ICD-10-CM | POA: Diagnosis not present

## 2017-09-10 DIAGNOSIS — E876 Hypokalemia: Secondary | ICD-10-CM | POA: Diagnosis not present

## 2017-09-10 DIAGNOSIS — S0990XA Unspecified injury of head, initial encounter: Secondary | ICD-10-CM | POA: Diagnosis not present

## 2017-09-10 DIAGNOSIS — K219 Gastro-esophageal reflux disease without esophagitis: Secondary | ICD-10-CM | POA: Insufficient documentation

## 2017-09-10 DIAGNOSIS — Z66 Do not resuscitate: Secondary | ICD-10-CM | POA: Insufficient documentation

## 2017-09-10 DIAGNOSIS — R52 Pain, unspecified: Secondary | ICD-10-CM

## 2017-09-10 DIAGNOSIS — J449 Chronic obstructive pulmonary disease, unspecified: Secondary | ICD-10-CM | POA: Diagnosis not present

## 2017-09-10 DIAGNOSIS — E7800 Pure hypercholesterolemia, unspecified: Secondary | ICD-10-CM | POA: Insufficient documentation

## 2017-09-10 DIAGNOSIS — Z79899 Other long term (current) drug therapy: Secondary | ICD-10-CM | POA: Diagnosis not present

## 2017-09-10 DIAGNOSIS — J849 Interstitial pulmonary disease, unspecified: Secondary | ICD-10-CM | POA: Diagnosis not present

## 2017-09-10 DIAGNOSIS — E8729 Other acidosis: Secondary | ICD-10-CM

## 2017-09-10 DIAGNOSIS — W19XXXA Unspecified fall, initial encounter: Secondary | ICD-10-CM | POA: Diagnosis not present

## 2017-09-10 DIAGNOSIS — R112 Nausea with vomiting, unspecified: Secondary | ICD-10-CM | POA: Diagnosis not present

## 2017-09-10 DIAGNOSIS — E871 Hypo-osmolality and hyponatremia: Secondary | ICD-10-CM | POA: Diagnosis not present

## 2017-09-10 LAB — BASIC METABOLIC PANEL
ANION GAP: 9 (ref 5–15)
Anion gap: 18 — ABNORMAL HIGH (ref 5–15)
BUN: 11 mg/dL (ref 6–20)
BUN: 12 mg/dL (ref 6–20)
CALCIUM: 9.8 mg/dL (ref 8.9–10.3)
CALCIUM: 8.1 mg/dL — AB (ref 8.9–10.3)
CO2: 22 mmol/L (ref 22–32)
CO2: 26 mmol/L (ref 22–32)
Chloride: 88 mmol/L — ABNORMAL LOW (ref 101–111)
Chloride: 98 mmol/L — ABNORMAL LOW (ref 101–111)
Creatinine, Ser: 0.87 mg/dL (ref 0.44–1.00)
Creatinine, Ser: 0.87 mg/dL (ref 0.44–1.00)
GFR calc Af Amer: >60 mL/min (ref >60)
GFR calc Af Amer: >60 mL/min (ref >60)
GFR calc non Af Amer: 60 mL/min — ABNORMAL LOW (ref >60)
GFR, EST NON AFRICAN AMERICAN: 60 mL/min — AB (ref >60)
GLUCOSE: 210 mg/dL — AB (ref 65–99)
GLUCOSE: 160 mg/dL — AB (ref 65–99)
POTASSIUM: 3.1 mmol/L — AB (ref 3.5–5.1)
Potassium: 3.1 mmol/L — ABNORMAL LOW (ref 3.5–5.1)
Sodium: 128 mmol/L — ABNORMAL LOW (ref 135–145)
Sodium: 133 mmol/L — ABNORMAL LOW (ref 135–145)

## 2017-09-10 LAB — URINALYSIS, ROUTINE W REFLEX MICROSCOPIC
BACTERIA UA: NONE SEEN
BILIRUBIN URINE: NEGATIVE
Glucose, UA: 150 mg/dL — AB
Hgb urine dipstick: NEGATIVE
KETONES UR: 20 mg/dL — AB
Nitrite: NEGATIVE
PH: 8 (ref 5.0–8.0)
Protein, ur: 30 mg/dL — AB
Specific Gravity, Urine: 1.017 (ref 1.005–1.030)

## 2017-09-10 LAB — CBC
HEMATOCRIT: 39.4 % (ref 36.0–46.0)
Hemoglobin: 12.5 g/dL (ref 12.0–15.0)
MCH: 26 pg (ref 26.0–34.0)
MCHC: 31.7 g/dL (ref 30.0–36.0)
MCV: 82.1 fL (ref 78.0–100.0)
Platelets: 403 10*3/uL — ABNORMAL HIGH (ref 150–400)
RBC: 4.8 MIL/uL (ref 3.87–5.11)
RDW: 17.9 % — AB (ref 11.5–15.5)
WBC: 9 10*3/uL (ref 4.0–10.5)

## 2017-09-10 LAB — I-STAT VENOUS BLOOD GAS, ED
ACID-BASE EXCESS: 4 mmol/L — AB (ref 0.0–2.0)
BICARBONATE: 30 mmol/L — AB (ref 20.0–28.0)
O2 Saturation: 60 %
PH VEN: 7.398 (ref 7.250–7.430)
TCO2: 31 mmol/L (ref 22–32)
pCO2, Ven: 48.7 mmHg (ref 44.0–60.0)
pO2, Ven: 32 mmHg (ref 32.0–45.0)

## 2017-09-10 LAB — HEPATIC FUNCTION PANEL
ALT: 18 U/L (ref 14–54)
AST: 24 U/L (ref 15–41)
Albumin: 4.1 g/dL (ref 3.5–5.0)
Alkaline Phosphatase: 82 U/L (ref 38–126)
BILIRUBIN DIRECT: 0.1 mg/dL (ref 0.1–0.5)
BILIRUBIN INDIRECT: 0.8 mg/dL (ref 0.3–0.9)
BILIRUBIN TOTAL: 0.9 mg/dL (ref 0.3–1.2)
Total Protein: 8 g/dL (ref 6.5–8.1)

## 2017-09-10 LAB — TROPONIN I: Troponin I: <0.03 ng/mL (ref <0.03)

## 2017-09-10 LAB — LIPASE, BLOOD: LIPASE: 24 U/L (ref 11–51)

## 2017-09-10 LAB — I-STAT TROPONIN, ED
TROPONIN I, POC: 0 ng/mL (ref 0.00–0.08)
Troponin i, poc: 0 ng/mL (ref 0.00–0.08)
Troponin i, poc: 0.01 ng/mL (ref 0.00–0.08)

## 2017-09-10 LAB — I-STAT CG4 LACTIC ACID, ED: LACTIC ACID, VENOUS: 1.37 mmol/L (ref 0.5–1.9)

## 2017-09-10 LAB — CBG MONITORING, ED: Glucose-Capillary: 156 mg/dL — ABNORMAL HIGH (ref 65–99)

## 2017-09-10 LAB — GLUCOSE, CAPILLARY: Glucose-Capillary: 130 mg/dL — ABNORMAL HIGH (ref 65–99)

## 2017-09-10 MED ORDER — ALBUTEROL SULFATE (2.5 MG/3ML) 0.083% IN NEBU: 2.5000 mg | INHALATION_SOLUTION | RESPIRATORY_TRACT | Status: DC | PRN

## 2017-09-10 MED ORDER — SODIUM CHLORIDE 0.9 % IV BOLUS
500.0000 mL | Freq: Once | INTRAVENOUS | Status: AC
  Administered 2017-09-10: 500 mL via INTRAVENOUS

## 2017-09-10 MED ORDER — GABAPENTIN 300 MG PO CAPS
600.0000 mg | ORAL_CAPSULE | Freq: Every day | ORAL | Status: DC
  Administered 2017-09-10: 600 mg via ORAL
  Filled 2017-09-10: qty 2

## 2017-09-10 MED ORDER — GI COCKTAIL ~~LOC~~: 30.0000 mL | Freq: Four times a day (QID) | ORAL | Status: DC | PRN

## 2017-09-10 MED ORDER — FUROSEMIDE 40 MG PO TABS
40.0000 mg | ORAL_TABLET | Freq: Every day | ORAL | Status: DC
  Administered 2017-09-10 – 2017-09-11 (×2): 40 mg via ORAL
  Filled 2017-09-10: qty 1
  Filled 2017-09-10: qty 2

## 2017-09-10 MED ORDER — LORAZEPAM 2 MG/ML IJ SOLN
1.0000 mg | Freq: Once | INTRAMUSCULAR | Status: AC
  Administered 2017-09-10: 1 mg via INTRAVENOUS
  Filled 2017-09-10: qty 1

## 2017-09-10 MED ORDER — IOHEXOL 300 MG/ML  SOLN
75.0000 mL | Freq: Once | INTRAMUSCULAR | Status: AC | PRN
  Administered 2017-09-10: 75 mL via INTRAVENOUS

## 2017-09-10 MED ORDER — ACETAMINOPHEN 650 MG RE SUPP: 650.0000 mg | Freq: Four times a day (QID) | RECTAL | Status: DC | PRN

## 2017-09-10 MED ORDER — SENNA 8.6 MG PO TABS
1.0000 | ORAL_TABLET | Freq: Two times a day (BID) | ORAL | Status: DC
  Administered 2017-09-10 – 2017-09-11 (×3): 8.6 mg via ORAL
  Filled 2017-09-10 (×3): qty 1

## 2017-09-10 MED ORDER — ACETAMINOPHEN 325 MG PO TABS: 650.0000 mg | ORAL_TABLET | Freq: Four times a day (QID) | ORAL | Status: DC | PRN

## 2017-09-10 MED ORDER — INSULIN ASPART 100 UNIT/ML ~~LOC~~ SOLN
0.0000 [IU] | Freq: Three times a day (TID) | SUBCUTANEOUS | Status: DC
  Administered 2017-09-11: 2 [IU] via SUBCUTANEOUS

## 2017-09-10 MED ORDER — FLUTICASONE PROPIONATE 50 MCG/ACT NA SUSP
1.0000 | NASAL | Status: DC | PRN
  Filled 2017-09-10: qty 16

## 2017-09-10 MED ORDER — SODIUM CHLORIDE 0.9 % IV SOLN
Freq: Once | INTRAVENOUS | Status: AC
  Administered 2017-09-10: 14:00:00 via INTRAVENOUS

## 2017-09-10 MED ORDER — RIVAROXABAN 15 MG PO TABS
15.0000 mg | ORAL_TABLET | Freq: Every day | ORAL | Status: DC
  Administered 2017-09-10: 15 mg via ORAL
  Filled 2017-09-10 (×2): qty 1

## 2017-09-10 MED ORDER — LACTATED RINGERS IV SOLN
INTRAVENOUS | Status: DC
  Administered 2017-09-10 – 2017-09-11 (×3): via INTRAVENOUS

## 2017-09-10 MED ORDER — MAGNESIUM OXIDE 400 (241.3 MG) MG PO TABS
400.0000 mg | ORAL_TABLET | Freq: Every day | ORAL | Status: DC
  Administered 2017-09-10: 400 mg via ORAL
  Filled 2017-09-10 (×2): qty 1

## 2017-09-10 MED ORDER — METOPROLOL TARTRATE 12.5 MG HALF TABLET
12.5000 mg | ORAL_TABLET | Freq: Two times a day (BID) | ORAL | Status: DC
  Administered 2017-09-10: 12.5 mg via ORAL
  Filled 2017-09-10: qty 1

## 2017-09-10 MED ORDER — LORAZEPAM 0.5 MG PO TABS
0.5000 mg | ORAL_TABLET | Freq: Every day | ORAL | Status: DC
  Administered 2017-09-10: 0.5 mg via ORAL
  Filled 2017-09-10: qty 1

## 2017-09-10 MED ORDER — PANTOPRAZOLE SODIUM 40 MG PO TBEC
40.0000 mg | DELAYED_RELEASE_TABLET | Freq: Every day | ORAL | Status: DC
  Administered 2017-09-10: 40 mg via ORAL
  Filled 2017-09-10: qty 1

## 2017-09-10 MED ORDER — POTASSIUM CHLORIDE CRYS ER 20 MEQ PO TBCR
40.0000 meq | EXTENDED_RELEASE_TABLET | Freq: Once | ORAL | Status: AC
  Administered 2017-09-10: 40 meq via ORAL
  Filled 2017-09-10: qty 2

## 2017-09-10 MED ORDER — BUPROPION HCL ER (XL) 150 MG PO TB24
150.0000 mg | ORAL_TABLET | Freq: Every day | ORAL | Status: DC
  Administered 2017-09-10 – 2017-09-11 (×2): 150 mg via ORAL
  Filled 2017-09-10 (×2): qty 1

## 2017-09-10 MED ORDER — SODIUM CHLORIDE 0.9% FLUSH
3.0000 mL | Freq: Two times a day (BID) | INTRAVENOUS | Status: DC
  Administered 2017-09-10: 3 mL via INTRAVENOUS

## 2017-09-10 MED ORDER — POLYETHYLENE GLYCOL 3350 17 G PO PACK
17.0000 g | PACK | Freq: Two times a day (BID) | ORAL | Status: DC
  Administered 2017-09-11 (×2): 17 g via ORAL
  Filled 2017-09-10 (×3): qty 1

## 2017-09-10 MED ORDER — ONDANSETRON HCL 4 MG/2ML IJ SOLN: 4.0000 mg | Freq: Four times a day (QID) | INTRAMUSCULAR | Status: DC | PRN

## 2017-09-10 MED ORDER — POTASSIUM CHLORIDE 10 MEQ/100ML IV SOLN
10.0000 meq | INTRAVENOUS | Status: AC
  Administered 2017-09-10 (×4): 10 meq via INTRAVENOUS
  Filled 2017-09-10 (×4): qty 100

## 2017-09-10 MED ORDER — POLYETHYLENE GLYCOL 3350 17 G PO PACK
17.0000 g | PACK | Freq: Two times a day (BID) | ORAL | Status: DC
  Administered 2017-09-10: 17 g via ORAL
  Filled 2017-09-10: qty 1

## 2017-09-10 NOTE — ED Notes (Signed)
Patient transported to CT 

## 2017-09-10 NOTE — H&P (Signed)
History and Physical  Angela Hines WUX:[id number] DOB: [birth year] DOA: 09/10/2017  PCP: Kelton Pillar, MD   Chief Complaint: Nausea, chest pain  HPI:  84 year old woman PMH atrial fibrillation, COPD, diabetes mellitus presented to the emergency department with nausea, vomiting and chest pain beginning last night.  Also noted shortness of breath.  Patient reports she recently fell and was seen in the emergency department.  After going home she felt well and never really had any soreness.  She was feeling well until last evening when she developed nausea and vomiting and chest pain.  No specific aggravating or alleviating factors and her history is a bit vague.  This morning she continued to have nausea but denies vomiting.  She does have chest pain earlier which she reports was more in her neck but is now resolved.  Currently she feels quite well, has no pain no nausea.  In fact she denies any complaints at this time.  She did note some shortness of breath earlier.  ED Course: Patient was treated with Ativan for nausea given her prolonged QT.  This was thought to have caused mild hypotension for which the patient was completely asymptomatic.  Extensive investigation including CT imaging was unremarkable.  She was given fluids for her high anion gap metabolic acidosis of unclear etiology.  Review of Systems:  Negative for fever, new visual changes, sore throat, rash, new muscle aches,dysuria, bleeding, n/v/abdominal pain now.  Positive for constipation with last bowel movement at least 4 days ago.  Past Medical History:  Diagnosis Date  . Atrial fibrillation ([place])   . CHF (congestive heart failure) ([place])   . COPD (chronic obstructive pulmonary disease) ([place])   . Diabetes mellitus without complication ([place])   . High cholesterol   . Hypertension     Past Surgical History:  Procedure Laterality Date  . CARDIAC PACEMAKER PLACEMENT  07/30/2014  . CARDIAC VALVE SURGERY     cow valve  placed in heart  . CARDIOVERSION N/A 06/15/2017   Procedure: CARDIOVERSION;  Surgeon: Lelon Perla, MD;  Location: [facility] ENDOSCOPY;  Service: Cardiovascular;  Laterality: N/A;  . CHOLECYSTECTOMY    . EXCISIONAL HEMORRHOIDECTOMY    . LOOP RECORDER INSERTION N/A 08/22/2017   Procedure: LOOP RECORDER INSERTION;  Surgeon: [name], MD;  Location: [facility] CV LAB;  Service: Cardiovascular;  Laterality: N/A;  . LOOP RECORDER REMOVAL N/A 08/22/2017   Procedure: LOOP RECORDER REMOVAL;  Surgeon: [name], MD;  Location: [facility];  Service: Cardiovascular;  Laterality: N/A;     reports that she has never smoked. She has never used smokeless tobacco. She reports that she does not drink alcohol or use drugs.   Allergies  Allergen Reactions  . Clonidine Swelling and Other (See Comments)    "head swelling"  . Captopril Swelling  . Codeine Nausea And Vomiting and Other (See Comments)    GI Intolerance   . Ceclor [Cefaclor] Rash    Family History  Problem Relation Age of Onset  . Emphysema Father   . Hypertension Sister   . Lung disease Sister   . Hypertension Brother   . Hypertension Brother   . Stroke Brother   . Other Son        one spleen removed, one with chronic pain from MVA  . Diabetes Son   . Multiple myeloma Son   . Heart disease Son   . Heart attack Son      Prior to Admission medications   Medication  Sig Start Date End Date Taking? Authorizing Provider  albuterol (PROVENTIL HFA;VENTOLIN HFA) 108 (90 Base) MCG/ACT inhaler Inhale 2 puffs into the lungs every 6 (six) hours as needed for shortness of breath.   Yes [provider]  amiodarone (PACERONE) 200 MG tablet Take 1 tablet (200 mg total) by mouth daily. 07/31/17  Yes Sherran Needs, NP  bismuth subsalicylate (PEPTO BISMOL) 262 MG/15ML suspension Take 30 mLs by mouth every 6 (six) hours as needed for indigestion.   Yes [provider]  buPROPion (WELLBUTRIN XL) 150 MG 24 hr tablet  Take 150 mg by mouth daily. 06/20/17  Yes [provider]  diphenhydramine-acetaminophen (TYLENOL PM EXTRA STRENGTH) 25-500 MG TABS tablet Take 1 tablet by mouth at bedtime as needed (sleep).   Yes [provider]  feeding supplement, ENSURE ENLIVE, (ENSURE ENLIVE) LIQD Take 237 mLs by mouth 3 (three) times daily between meals. Patient taking differently: Take 237 mLs by mouth daily.  06/12/17  Yes Lavina Hamman, MD  FLUoxetine (PROZAC) 10 MG capsule Take 10 mg by mouth daily. 08/17/17  Yes [provider]  fluticasone (FLONASE) 50 MCG/ACT nasal spray Place 1 spray into both nostrils as needed for allergies or rhinitis.   Yes [provider]  furosemide (LASIX) 20 MG tablet Take 2 tablets (40 mg total) by mouth daily. 06/12/17  Yes Lavina Hamman, MD  gabapentin (NEURONTIN) 300 MG capsule Take 600 mg by mouth at bedtime.  07/17/16  Yes [provider]  hydrOXYzine (ATARAX/VISTARIL) 10 MG tablet Take 10 mg by mouth at bedtime as needed for sleep or anxiety. 08/17/17  Yes [provider]  LORazepam (ATIVAN) 0.5 MG tablet Take 0.5 mg by mouth at bedtime.  10/26/16  Yes [provider]  magnesium oxide (MAG-OX) 400 (241.3 Mg) MG tablet Take 1 tablet (400 mg total) by mouth daily. 05/24/17  Yes Nita Sells, MD  metFORMIN (GLUCOPHAGE) 1000 MG tablet Take 1,000 mg by mouth daily. 12/01/16  Yes [provider]  metoprolol tartrate (LOPRESSOR) 25 MG tablet Take 0.5 tablets (12.5 mg total) by mouth 2 (two) times daily. 07/24/17 10/22/17 Yes Sherran Needs, NP  omeprazole (PRILOSEC) 40 MG capsule Take 40 mg by mouth daily.    Yes [provider]  oxymetazoline (AFRIN) 0.05 % nasal spray Place 1 spray into both nostrils as needed for congestion.   Yes [provider]  polyethylene glycol (MIRALAX / GLYCOLAX) packet Take 17 g by mouth daily as needed for mild constipation. 06/12/17  Yes Lavina Hamman, MD  potassium chloride  (K-DUR,KLOR-CON) 10 MEQ tablet Take 2 tablets (20 mEq total) by mouth daily. 06/12/17  Yes Lavina Hamman, MD  Rivaroxaban (XARELTO) 15 MG TABS tablet Take 15 mg by mouth daily with supper. 07/17/16  Yes [provider]    Physical Exam: Vitals:   09/10/17 1400 09/10/17 1431  BP: 94/63 (!) 81/54  Pulse: 65 66  Resp: 19 15  Temp:    SpO2: 100% 100%    Constitutional:   . Appears calm and comfortable Eyes:  . pupils and irises appear normal . Normal lids  ENMT:  . grossly normal hearing  . Lips appear normal Neck:  . neck appears normal, no masses . no thyromegaly Respiratory:  . CTA bilaterally, no w/r/r.  . Respiratory effort normal . DP pulses bilaterally 2+ Cardiovascular:  . RRR, no m/r/g . No LE extremity edema   Abdomen:  . Soft, nontender, nondistended.  No hepatomegaly.  No. Musculoskeletal:  . Digits/nails BUE: no clubbing, cyanosis, petechiae, infection . RUE, LUE, RLE, LLE   o strength and tone normal, no atrophy, no abnormal movements o No tenderness, masses Skin:  . Sensitive ecchymosis over the face . palpation of skin: no induration or nodules Neurologic:  . Appears grossly intact Psychiatric:  . Mental status o Mood, affect appropriate . judgment and insight appear intact    I have personally reviewed following labs and imaging studies  Labs:   Sodium 120, potassium 3.1, remainder BMP unremarkable except for chloride of 88 and anion gap of 18.  LFTs unremarkable.  Troponins negative over 5 hours.  Lactic acid within normal limits.  CBC unremarkable.  Imaging studies:   Chest x-ray independently reviewed, no acute disease.  CT head no acute changes.  Abdominal x-ray showed large stool burden  CT abdomen and pelvis no acute findings.  No evidence of bowel obstruction or inflammation.  Medical tests:   EKG independently reviewed: Sinus rhythm, right atrial enlargement, LVH with repolarization abnormality, old; prolonged QT,  old compared to previous study 07/24/2017.   Principal Problem:   Nausea & vomiting Active Problems:   Diabetes mellitus (HCC)   AF (paroxysmal atrial fibrillation) (HCC)   S/P AVR (aortic valve replacement)   Prolonged QT interval   Chest pain   High anion gap metabolic acidosis   Chronic systolic CHF (congestive heart failure) (HCC)   Assessment/Plan Nausea, vomiting, chest pain.  Resolved now.  Etiology unclear.  CT abdomen pelvis is negative.  Troponins over 5 hours are negative.  EKG is nonacute.  She is currently pain-free and nausea free.  May have been gastroenteritis.  Consider esophagitis.  Doubt ACS given above. --Supportive care.  IV fluids. --Trend troponins  Asymptomatic hypotension status post Ativan. --Completely asymptomatic.  History and lab work suggest dehydration.  Lactic acid within normal limits, normal WBC and no evidence of sepsis. --IV fluids.  Follow clinically.  Hyponatremia, hypochloremia.  Likely secondary to poor oral intake over the last 24 hours with associated vomiting. --IV fluids.  Repeat BMP in a.m.  Anion gap metabolic acidosis, etiology unclear.  Minimal elevation of blood sugar.  Only on metformin for diabetes.  No reason to suspect euglycemic DKA.  Suspect will clear with IV fluids. --IV fluids.  Recheck BMP later today and in the morning.  Hypokalemia. --Replete.  Atrial fibrillation.  Currently in sinus rhythm. --Continue Xarelto.  Chronic systolic congestive heart failure, LVEF 40-45%, diffuse hypokinesis by echocardiogram February 2019.  Bioprosthetic aortic valve noted that time with moderate stenosis.  Moderate reduction of systolic function also noted at that time.  Moderate tricuspid regurgitation. --Appears dry  Chronic prolonged QT --Avoid medications that may prolong QT.  Diabetes mellitus type 2.  Hold metformin during hospitalization. --See discussion above.  Sliding scale insulin.  Repeat BMP this p.m.   Severity of  Illness: The appropriate patient status for this patient is OBSERVATION. Observation status is judged to be reasonable and necessary in order to provide the required intensity of service to ensure the patient's safety. The patient's presenting symptoms, physical exam findings, and initial radiographic and laboratory data in the context of their medical condition is felt to place them at decreased risk for further clinical deterioration. Furthermore, it is anticipated that the patient will be medically stable for discharge from the hospital within 2 midnights of admission. The following factors support the patient status of observation.   See immediately above  DVT prophylaxis: Xarelto Code Status:  DNR Family Communication: none Consults called: none    Time spent: 60 minutes  Murray Hodgkins, MD  [facility] Direct contact: [phone] --[place] [place]  --[email or web address]; password [email or web address]  7PM-7AM contact night coverage as above  09/10/2017, 3:09 PM

## 2017-09-10 NOTE — ED Provider Notes (Signed)
Emergency Department Provider Note   I have reviewed the triage vital signs and the nursing notes.   HISTORY  Chief Complaint Emesis and Chest Pain   HPI Angela Hines is a 84 y.o. female with PMH of PAF on Xarelto, CHD, COPD, DM, HLD, and HTN presents to the emergency department for evaluation of nausea, vomiting, and CP.  The patient's symptoms began at 11 PM last night.  She states that she initially had nausea and vomiting which was severe.  She denies any blood in the emesis.  At some point during the night she developed central chest discomfort which she describes as pressure and nonradiating.  No modifying factors.  She denies chest pain only with vomiting.  She called family this morning and states that she was having some difficulty breathing and is known to have COPD exacerbations in the past.  She denies any fevers, chills, sick contacts.  No recent travel.  No chest pain currently.  Patient did have a fall with head/face trauma and was seen in the emergency department 2 days ago with a normal CT scan at that time.    Past Medical History:  Diagnosis Date  . Atrial fibrillation ([place])   . CHF (congestive heart failure) ([place])   . COPD (chronic obstructive pulmonary disease) ([place])   . Diabetes mellitus without complication ([place])   . High cholesterol   . Hypertension     Patient Active Problem List   Diagnosis Date Noted  . Nausea & vomiting 09/10/2017  . Chest pain 09/10/2017  . High anion gap metabolic acidosis 76/73/4193  . Chronic systolic CHF (congestive heart failure) ([place]) 09/10/2017  . Protein-calorie malnutrition, severe 06/11/2017  . Prolonged QT interval   . Diabetes mellitus ([place]) 07/17/2016  . Gastroesophageal reflux disease without esophagitis 07/17/2016  . Generalized anxiety disorder 07/17/2016  . Hypercholesterolemia 07/17/2016  . Hypertension 07/17/2016  . Status post placement of implantable loop recorder 07/17/2016  . AF (paroxysmal atrial  fibrillation) ([place]) 07/17/2016  . Primary insomnia 07/17/2016  . S/P AVR (aortic valve replacement) 07/17/2016  . History of syncope 08/07/2014    Past Surgical History:  Procedure Laterality Date  . CARDIAC PACEMAKER PLACEMENT  07/30/2014  . 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 CARDIOVERSION N/A 06/15/2017   Procedure: CARDIOVERSION;  Surgeon: Lelon Perla, MD;  Location[facility] ENDOSCOPY;  Service: Cardiovascular;  Laterality: N/A;  . CHOLECYSTECTOMY    . EXCISIONAL HEMORRHOIDECTOMY    . LOOP RECORDER INSERTION N/A 08/22/2017   Procedure: LOOP RECORDER INSERTION;  Surgeon: [name], MD;  Location: [facility];  Service: Cardiovascular;  Laterality: N/A;  . LOOP RECORDER REMOVAL N/A 08/22/2017   Procedure: LOOP RECORDER REMOVAL;  Surgeon: [name], MD;  Location: [facility];  Service: Cardiovascular;  Laterality: N/A;    Current Outpatient Rx  . Order #: [id number] Class: Historical Med  . Order #: [id number] Class: Normal  . Order #: [id number] Class: Historical Med  . Order #: [id number] Class: Historical Med  . Order #: [id number] Class: Historical Med  . Order #: [id number] Class: Normal  . Order #: [id number] Class: Historical Med  . Order #: [id number] Class: Historical Med  . Order #: [id number] Class: Normal  . Order #: [id number] Class: Historical Med  . Order #: [id number] Class: Historical Med  . Order #: [id number] Class: Historical Med  . Order #: [id number] Class: Normal  . Order #: [id number] Class: Historical Med  . Order #: [id number] Class: Normal  . Order #: [id number] Class: Historical  Med  . Order #: [id number] Class: Historical Med  . Order #: [id number] Class: Normal  . Order #: [id number] Class: Normal  . Order #: [id number] Class: Historical Med    Allergies Clonidine; Captopril; Codeine; and Ceclor [cefaclor]  Family History  Problem Relation Age of Onset  . Emphysema Father   . Hypertension Sister   . Lung disease Sister   . Hypertension  Brother   . Hypertension Brother   . Stroke Brother   . Other Son        one spleen removed, one with chronic pain from MVA  . Diabetes Son   . Multiple myeloma Son   . Heart disease Son   . Heart attack Son     Social History Social History   Tobacco Use  . Smoking status: Never Smoker  . Smokeless tobacco: Never Used  Substance Use Topics  . Alcohol use: No  . Drug use: No    Review of Systems  Constitutional: No fever/chills Eyes: No visual changes. ENT: No sore throat. Cardiovascular: Positive chest pain. Respiratory: Denies shortness of breath. Gastrointestinal: No abdominal pain. Positive nausea and vomiting.  No diarrhea.  No constipation. Genitourinary: Negative for dysuria. Musculoskeletal: Negative for back pain. Skin: Bruising to the face/forehead (old) Neurological: Negative for headaches, focal weakness or numbness.  10-point ROS otherwise negative.  ____________________________________________   PHYSICAL EXAM:  VITAL SIGNS: ED Triage Vitals  Enc Vitals Group     BP 09/10/17 0803 (!) 137/107     Pulse Rate 09/10/17 0803 80     Resp 09/10/17 0803 (!) 22     Temp 09/10/17 0803 (!) 97.3 F (36.3 C)     Temp Source 09/10/17 0803 Axillary     SpO2 09/10/17 0803 100 %     Pain Score 09/10/17 0802 0   Constitutional: Alert and oriented. Well appearing and in no acute distress. Eyes: Conjunctivae are normal. Head: Bruising to the forehead and face with old appearing hematoma to the left forehead.  Nose: No congestion/rhinnorhea. Mouth/Throat: Mucous membranes are moist.  Oropharynx non-erythematous. Neck: No stridor.   Cardiovascular: Normal rate, regular rhythm. Good peripheral circulation. Grossly normal heart sounds.   Respiratory: Normal respiratory effort.  No retractions. Lungs CTAB. Gastrointestinal: Soft and nontender. No distention.  Musculoskeletal: No lower extremity tenderness nor edema. No gross deformities of extremities. Neurologic:   Normal speech and language. No gross focal neurologic deficits are appreciated.  Skin:  Skin is warm, dry and intact. No rash noted.  ____________________________________________   LABS (all labs ordered are listed, but only abnormal results are displayed)  Labs Reviewed  BASIC METABOLIC PANEL - Abnormal; Notable for the following components:      Result Value   Sodium 128 (*)    Potassium 3.1 (*)    Chloride 88 (*)    Glucose, Bld 210 (*)    GFR calc non Af Amer 60 (*)    Anion gap 18 (*)    All other components within normal limits  CBC - Abnormal; Notable for the following components:   RDW 17.9 (*)    Platelets 403 (*)    All other components within normal limits  URINALYSIS, ROUTINE W REFLEX MICROSCOPIC - Abnormal; Notable for the following components:   Color, Urine STRAW (*)    Glucose, UA 150 (*)    Ketones, ur 20 (*)    Protein, ur 30 (*)    Leukocytes, UA SMALL (*)    All other components within normal limits  I-STAT  VENOUS BLOOD GAS, ED - Abnormal; Notable for the following components:   Bicarbonate 30.0 (*)    Acid-Base Excess 4.0 (*)    All other components within normal limits  HEPATIC FUNCTION PANEL  LIPASE, BLOOD  TROPONIN I  TROPONIN I  BASIC METABOLIC PANEL  I-STAT TROPONIN, ED  I-STAT TROPONIN, ED  I-STAT CG4 LACTIC ACID, ED  I-STAT TROPONIN, ED   ____________________________________________  EKG   EKG Interpretation  Date/Time:  Monday Sep 10 2017 07:58:43 EDT Ventricular Rate:  81 PR Interval:  194 QRS Duration: 106 QT Interval:  436 QTC Calculation: 506 R Axis:   -63 Text Interpretation:  Normal sinus rhythm with sinus arrhythmia Right atrial enlargement Pulmonary disease pattern Left anterior fascicular block Left ventricular hypertrophy with repolarization abnormality Prolonged QT Abnormal ECG No STEMI.  Confirmed by Nanda Quinton [phone]) on 09/10/2017 9:50:26 AM       ____________________________________________  RADIOLOGY  Dg  Chest 2 View  Result Date: 09/10/2017 CLINICAL DATA:  Vomiting.  Chest pain fall 3 days ago. EXAM: CHEST - 2 VIEW COMPARISON:  One-view chest x-ray 06/09/2017 and 05/17/2017. FINDINGS: The heart size is normal. Aortic valve replacement is noted. Fractured upper sternal wire is again noted. There is no edema or effusion. No focal airspace disease present. Emphysematous changes are noted. Healed posterior right eighth rib fracture is noted. IMPRESSION: 1. No acute cardiopulmonary disease. 2. Emphysema Electronically Signed   By: San Morelle M.D.   On: 09/10/2017 09:10   Ct Head Wo Contrast  Result Date: 09/10/2017 CLINICAL DATA:  Fall 4 days ago. Head trauma. Patient is on anticoagulants. Scalp hematoma at that time. EXAM: CT HEAD WITHOUT CONTRAST TECHNIQUE: Contiguous axial images were obtained from the base of the skull through the vertex without intravenous contrast. COMPARISON:  CT head without contrast 09/08/2017 FINDINGS: Brain: Mild atrophy and white matter changes are stable, within normal limits for age. No acute infarct, hemorrhage, or mass lesion is present. No significant extraaxial fluid collection is present. The ventricles are of normal size. Brainstem is normal. A remote lacunar infarct is again seen in the posterior right cerebellum. The cerebellum is otherwise unremarkable. Vascular: Atherosclerotic calcifications are present within the cavernous internal carotid arteries bilaterally. There is no hyperdense vessel. Skull: Calvarium is intact. Left frontal scalp hematoma is stable at 7 mm. There is no underlying fracture. No new scalp hematoma is present. Sinuses/Orbits: Chronic right sphenoid sinus disease is present. The paranasal sinuses and mastoid air cells are otherwise clear. Globes and orbits are with in normal limits for age. Bilateral lens replacements are noted. IMPRESSION: 1. Normal CT appearance of the head for age. 2. Stable left frontal scalp hematoma without underlying  fracture. 3. No acute trauma to the head otherwise. Electronically Signed   By: San Morelle M.D.   On: 09/10/2017 11:01   Ct Abdomen Pelvis W Contrast  Result Date: 09/10/2017 CLINICAL DATA:  Acute abdominal pain, generalized EXAM: CT ABDOMEN AND PELVIS WITH CONTRAST TECHNIQUE: Multidetector CT imaging of the abdomen and pelvis was performed using the standard protocol following bolus administration of intravenous contrast. CONTRAST:  39m OMNIPAQUE IOHEXOL 300 MG/ML  SOLN COMPARISON:  01/10/2017 FINDINGS: Lower chest: Chronic small nodules in the right lower lobe, inflammatory based on clustering and prior. Aortic valve replacement. No acute finding. Hepatobiliary: Mass in the lower right lobe measuring 11 mm. Initially there is peripheral nodular enhancement and there is some fill in on the delayed phase, stable from prior and again favoring hemangioma.  A smaller and simple cyst is seen in the central right liver.Cholecystectomy with stable intra and extrahepatic bile duct dilatation without visible obstructive process. Pancreas: 2 notable pancreatic cysts, 10 mm in the body and 14 by 9 mm in the uncinate. Even smaller cysts are seen in the body and tail on coronal reformats, marked on series 6. No detected enhancement/nodularity. The pancreas is diffusely atrophic. No main duct dilatation. Spleen: Unremarkable. Adrenals/Urinary Tract: Negative adrenals. No hydronephrosis or stone. Probable mild renal cortical scarring. Full urinary bladder without acute finding. Stomach/Bowel:  No obstruction. No appendicitis. Vascular/Lymphatic: No acute vascular abnormality. Atherosclerotic calcification. No mass or adenopathy. Reproductive:Dystrophic. Calcification in the uterus considered incidental Other: No ascites or pneumoperitoneum. Musculoskeletal: No acute abnormalities. Osteopenia and focal L5-S1 disc degeneration. IMPRESSION: 1. No acute finding. Negative for bowel obstruction or visible intra-abdominal  inflammation. 2. Stable 11 mm lesion in hepatic segment 6, features again suggesting hemangioma. 3. Chronic intra and extrahepatic bile duct dilatation after cholecystectomy, likely reservoir effect given normal biliary labs. 4. Multiple simple appearing pancreatic cysts measuring up to 14 x 9 mm. If appropriate for comorbidities, follow-up pancreas MRI or CT could be obtained in 2 years. Electronically Signed   By: Monte Fantasia M.D.   On: 09/10/2017 12:44   Dg Abd 2 Views  Result Date: 09/10/2017 CLINICAL DATA:  Nausea and vomiting.  Abdominal pain. EXAM: ABDOMEN - 2 VIEW COMPARISON:  None. FINDINGS: Large amount of stool in the colon. Gas is seen in the rectum. Dilated loops of bowel in the central abdomen measure up to 7.8 cm and may represent sigmoid colon. No free air. IMPRESSION: 1. Gaseous distention of bowel in central abdomen may represent the sigmoid colon. CT abdomen pelvis with contrast would likely be helpful in further evaluation, as clinically indicated. 2. Large amount of stool in the colon is indicative of constipation. Electronically Signed   By: Lorin Picket M.D.   On: 09/10/2017 11:21    ____________________________________________   PROCEDURES  Procedure(s) performed:   .Critical Care Performed by: Margette Fast, MD Authorized by: Margette Fast, MD   Critical care provider statement:    Critical care time (minutes):  35   Critical care time was exclusive of:  Separately billable procedures and treating other patients and teaching time   Critical care was necessary to treat or prevent imminent or life-threatening deterioration of the following conditions:  Shock and circulatory failure   Critical care was time spent personally by me on the following activities:  Blood draw for specimens, development of treatment plan with patient or surrogate, evaluation of patient's response to treatment, examination of patient, obtaining history from patient or surrogate, ordering  and performing treatments and interventions, ordering and review of laboratory studies, pulse oximetry, ordering and review of radiographic studies, re-evaluation of patient's condition and review of old charts   I assumed direction of critical care for this patient from another provider in my specialty: no     _________________________________________   INITIAL IMPRESSION / [place] / ED COURSE  Pertinent labs & imaging results that were available during my care of the patient were reviewed by me and considered in my medical decision making (see chart for details).  Patient presents to the emergency department for evaluation of nausea and vomiting which started last night.  She also endorses some substernal chest pain and dyspnea this morning.  She was given Zofran prior to arrival but continues to have nausea and vomiting.  EKG shows  a prolonged QT interval.  The patient has significant bruising to the forehead and face from earlier fall.  Initial CT was negative.  Plan for repeat CT to rule out delayed bleed but have low suspicion for this clinically.  Normal neurological exam.  The patient's abdomen is diffusely soft and nontender.  Plan for Ativan to control nausea, IV fluids, reassess after imaging and labs.  Patient labs and imaging including CT reviewed. Patient developed some mild hypotension in the ED but has remained clinically unchanged. Doubt sepsis. CT imaging with no acute findings. Labs show hypochloremia and mild hyperglycemia with gap acidosis. Doubt euglycemic DKA. Suspect that gap is from low chloride. Patient BP improving with IVF and nausea improved with Ativan. Plan for admit for enzyme trending given CP symptoms and further IV hydration.   Discussed patient's case with Hospitalist to request admission. Patient and family (if present) updated with plan. Care transferred to Hospitalist service.  I reviewed all nursing notes, vitals, pertinent old records, EKGs,  labs, imaging (as available).   ____________________________________________  FINAL CLINICAL IMPRESSION(S) / ED DIAGNOSES  Final diagnoses:  Precordial chest pain  Non-intractable vomiting with nausea, unspecified vomiting type  Dehydration  Hypochloremia     MEDICATIONS GIVEN DURING THIS VISIT:  Medications  albuterol (PROVENTIL) (2.5 MG/3ML) 0.083% nebulizer solution 2.5 mg (has no administration in time range)  buPROPion (WELLBUTRIN XL) 24 hr tablet 150 mg (has no administration in time range)  fluticasone (FLONASE) 50 MCG/ACT nasal spray 1 spray (has no administration in time range)  furosemide (LASIX) tablet 40 mg (has no administration in time range)  gabapentin (NEURONTIN) capsule 600 mg (has no administration in time range)  LORazepam (ATIVAN) tablet 0.5 mg (has no administration in time range)  magnesium oxide (MAG-OX) tablet 400 mg (has no administration in time range)  metoprolol tartrate (LOPRESSOR) tablet 12.5 mg (has no administration in time range)  pantoprazole (PROTONIX) EC tablet 40 mg (has no administration in time range)  polyethylene glycol (MIRALAX / GLYCOLAX) packet 17 g (has no administration in time range)  Rivaroxaban (XARELTO) tablet 15 mg (has no administration in time range)  insulin aspart (novoLOG) injection 0-9 Units (has no administration in time range)  sodium chloride flush (NS) 0.9 % injection 3 mL (has no administration in time range)  acetaminophen (TYLENOL) tablet 650 mg (has no administration in time range)    Or  acetaminophen (TYLENOL) suppository 650 mg (has no administration in time range)  lactated ringers infusion (has no administration in time range)  senna (SENOKOT) tablet 8.6 mg (has no administration in time range)  polyethylene glycol (MIRALAX / GLYCOLAX) packet 17 g (has no administration in time range)  gi cocktail (Maalox,Lidocaine,Donnatal) (has no administration in time range)  potassium chloride 10 mEq in 100 mL IVPB (has  no administration in time range)  sodium chloride 0.9 % bolus 500 mL (0 mLs Intravenous Stopped 09/10/17 1136)  LORazepam (ATIVAN) injection 1 mg (1 mg Intravenous Given 09/10/17 1034)  sodium chloride 0.9 % bolus 500 mL (0 mLs Intravenous Stopped 09/10/17 1258)  iohexol (OMNIPAQUE) 300 MG/ML solution 75 mL (75 mLs Intravenous Contrast Given 09/10/17 1233)  0.9 %  sodium chloride infusion ( Intravenous New Bag/Given 09/10/17 1414)    Note:  This document was prepared using Dragon voice recognition software and may include unintentional dictation errors.  Nanda Quinton, MD Emergency Medicine    Toni Hoffmeister, [facility], MD 09/10/17 1910

## 2017-09-10 NOTE — ED Notes (Signed)
Pt c.o nausea, admitting MD paged for prn order

## 2017-09-10 NOTE — ED Triage Notes (Signed)
Pt arrives from [facility], c/o nausea and vomiting that began last night around 11pm, initially c/o some mid sternal chest pain with ems.  pt received 4mg  zofran pta, reports ongoing nausea. Recent fall on Thursday with significant bruising to face that pt was seen in ED for. Pt a/ox4.

## 2017-09-10 NOTE — ED Notes (Signed)
Pt ambulatory to the restroom with 1 assist

## 2017-09-11 ENCOUNTER — Other Ambulatory Visit: Payer: Self-pay

## 2017-09-11 DIAGNOSIS — I5022 Chronic systolic (congestive) heart failure: Secondary | ICD-10-CM | POA: Diagnosis not present

## 2017-09-11 DIAGNOSIS — R112 Nausea with vomiting, unspecified: Secondary | ICD-10-CM | POA: Diagnosis not present

## 2017-09-11 DIAGNOSIS — E872 Acidosis: Secondary | ICD-10-CM

## 2017-09-11 DIAGNOSIS — R9431 Abnormal electrocardiogram [ECG] [EKG]: Secondary | ICD-10-CM

## 2017-09-11 DIAGNOSIS — Z952 Presence of prosthetic heart valve: Secondary | ICD-10-CM

## 2017-09-11 DIAGNOSIS — R0789 Other chest pain: Secondary | ICD-10-CM | POA: Diagnosis not present

## 2017-09-11 DIAGNOSIS — I480 Paroxysmal atrial fibrillation: Secondary | ICD-10-CM

## 2017-09-11 DIAGNOSIS — E119 Type 2 diabetes mellitus without complications: Secondary | ICD-10-CM | POA: Diagnosis not present

## 2017-09-11 LAB — BASIC METABOLIC PANEL
Anion gap: 11 (ref 5–15)
BUN: 7 mg/dL (ref 6–20)
CALCIUM: 8.2 mg/dL — AB (ref 8.9–10.3)
CO2: 26 mmol/L (ref 22–32)
CREATININE: 0.78 mg/dL (ref 0.44–1.00)
Chloride: 96 mmol/L — ABNORMAL LOW (ref 101–111)
GFR calc non Af Amer: >60 mL/min (ref >60)
Glucose, Bld: 140 mg/dL — ABNORMAL HIGH (ref 65–99)
Potassium: 3 mmol/L — ABNORMAL LOW (ref 3.5–5.1)
Sodium: 133 mmol/L — ABNORMAL LOW (ref 135–145)

## 2017-09-11 LAB — MRSA PCR SCREENING: MRSA by PCR: NEGATIVE

## 2017-09-11 LAB — CBC
HCT: 32.6 % — ABNORMAL LOW (ref 36.0–46.0)
Hemoglobin: 10.3 g/dL — ABNORMAL LOW (ref 12.0–15.0)
MCH: 26.7 pg (ref 26.0–34.0)
MCHC: 31.6 g/dL (ref 30.0–36.0)
MCV: 84.5 fL (ref 78.0–100.0)
PLATELETS: 312 10*3/uL (ref 150–400)
RBC: 3.86 MIL/uL — ABNORMAL LOW (ref 3.87–5.11)
RDW: 18.1 % — AB (ref 11.5–15.5)
WBC: 8.9 10*3/uL (ref 4.0–10.5)

## 2017-09-11 LAB — GLUCOSE, CAPILLARY
GLUCOSE-CAPILLARY: 97 mg/dL (ref 65–99)
Glucose-Capillary: 145 mg/dL — ABNORMAL HIGH (ref 65–99)

## 2017-09-11 LAB — TROPONIN I: Troponin I: <0.03 ng/mL (ref <0.03)

## 2017-09-11 MED ORDER — POTASSIUM CHLORIDE 10 MEQ/100ML IV SOLN
10.0000 meq | INTRAVENOUS | Status: AC
  Administered 2017-09-11 (×4): 10 meq via INTRAVENOUS
  Filled 2017-09-11 (×4): qty 100

## 2017-09-11 MED ORDER — SODIUM CHLORIDE 0.9 % IV BOLUS
500.0000 mL | Freq: Once | INTRAVENOUS | Status: AC
  Administered 2017-09-11: 500 mL via INTRAVENOUS

## 2017-09-11 MED ORDER — POTASSIUM CHLORIDE CRYS ER 20 MEQ PO TBCR
40.0000 meq | EXTENDED_RELEASE_TABLET | Freq: Once | ORAL | Status: AC
  Administered 2017-09-11: 40 meq via ORAL
  Filled 2017-09-11: qty 2

## 2017-09-11 MED ORDER — MAGNESIUM OXIDE 400 (241.3 MG) MG PO TABS
800.0000 mg | ORAL_TABLET | Freq: Once | ORAL | Status: AC
  Administered 2017-09-11: 800 mg via ORAL
  Filled 2017-09-11: qty 2

## 2017-09-11 MED ORDER — PANTOPRAZOLE SODIUM 40 MG PO TBEC: 40.0000 mg | DELAYED_RELEASE_TABLET | Freq: Two times a day (BID) | ORAL | Status: DC

## 2017-09-11 NOTE — Progress Notes (Signed)
MD paged through [facility] that BP is low and Magnesium orders are duplicated.  Awaiting call back

## 2017-09-11 NOTE — Progress Notes (Signed)
Pt had low BP during the shift twice. NP on call notified. Bolus ordered twice, final BP 93/56 after both boluses. Will continue to monitor.

## 2017-09-11 NOTE — Discharge Summary (Signed)
Physician Discharge Summary  Angela Hines LPF:[id number] DOB: [birth year] DOA: 09/10/2017  PCP: Kelton Pillar, MD  Admit date: 09/10/2017 Discharge date: 09/11/2017  Admitted From: Home  Disposition:  Home   Recommendations for Outpatient Follow-up:  1. Follow up with PCP in 1-2 weeks 2. Please obtain BMP/CBC in one week your next doctors visit.   Home Health: None Equipment/Devices: None Discharge Condition: Stable CODE STATUS: DO NOT RESUSCITATE Diet recommendation: Diabetic  Brief/Interim Summary: 84 year old female with history of atrial fibrillation, COPD, diabetes mellitus type 2 came to the hospital with complaints of nausea, vomiting and atypical chest pain.  She reported she had been feeling some nauseous and episode of nonbloody vomiting a day prior to her admission but upon admission her symptoms had resolved.  She was noted to have slightly prolonged QT which has been chronic on the EKG and was initially slightly hypotensive as well this improved after getting IV fluids.  CT of the abdomen pelvis did not show any acute intra-abdominal pathology.  Initial labs showed high anion gap metabolic acidosis likely secondary to dehydration.  Patient was admitted for overnight monitoring and she received IV fluids, following day her labs normalized, anion gap resolved and her symptoms of nausea resolved as well. She felt back to her baseline and did not have any further complaints.  She had reached maximum benefit from an hospital stay and no further inpatient work-up is indicated at this time.  She can follow-up outpatient with above recommendations.   Discharge Diagnoses:  Principal Problem:   Nausea & vomiting Active Problems:   Diabetes mellitus (HCC)   AF (paroxysmal atrial fibrillation) (HCC)   S/P AVR (aortic valve replacement)   Prolonged QT interval   Chest pain   High anion gap metabolic acidosis   Chronic systolic CHF (congestive heart failure) (HCC)  Nausea and  nonbloody vomiting, resolved Atypical chest pain, resolved - CT of the abdomen pelvis is negative for any acute pathology -Troponins remain flat and negative - Advised to continue hydrate herself orally at home  Mild to moderate dehydration, resolved Hyponatremia with hypoCl Anion gap metabolic acidosis -All of this is resolved with IV fluids.  At this point she is advised to continue to orally hydrate herself at home.  History of atrial fibrillation -Currently she is in sinus rhythm.  Continue all her home regimen along with Xarelto.  She is on amiodarone and metoprolol  Chronic systolic congestive heart failure, ejection fraction 40-45% -Echocardiogram done in February 2019 showed ejection fraction 40-45% with diffuse hypokinesis.  Currently she appears more euvolemic after overnight hydration.  Continue home metoprolol.  Use Lasix with caution at home due to risk of dehydration  Diabetes mellitus type 2 Peripheral neuropathy secondary to diabetes mellitus type 2 -Continue home regimen of metformin, gabapentin  GERD -Continue PPI  Prolonged QTC - QTC around 510 range which has been chronic.  Xarelto- DVT   Discharge Instructions   Allergies as of 09/11/2017      Reactions   Clonidine Swelling, Other (See Comments)   "head swelling"   Captopril Swelling   Codeine Nausea And Vomiting, Other (See Comments)   GI Intolerance   Ceclor [cefaclor] Rash      Medication List    TAKE these medications   albuterol 108 (90 Base) MCG/ACT inhaler Commonly known as:  PROVENTIL HFA;VENTOLIN HFA Inhale 2 puffs into the lungs every 6 (six) hours as needed for shortness of breath.   amiodarone 200 MG tablet Commonly known as:  PACERONE  Take 1 tablet (200 mg total) by mouth daily.   bismuth subsalicylate 161 WR/60AV suspension Commonly known as:  PEPTO BISMOL Take 30 mLs by mouth every 6 (six) hours as needed for indigestion.   buPROPion 150 MG 24 hr tablet Commonly known as:   WELLBUTRIN XL Take 150 mg by mouth daily.   feeding supplement (ENSURE ENLIVE) Liqd Take 237 mLs by mouth 3 (three) times daily between meals. What changed:  when to take this   FLUoxetine 10 MG capsule Commonly known as:  PROZAC Take 10 mg by mouth daily.   fluticasone 50 MCG/ACT nasal spray Commonly known as:  FLONASE Place 1 spray into both nostrils as needed for allergies or rhinitis.   furosemide 20 MG tablet Commonly known as:  LASIX Take 2 tablets (40 mg total) by mouth daily.   gabapentin 300 MG capsule Commonly known as:  NEURONTIN Take 600 mg by mouth at bedtime.   hydrOXYzine 10 MG tablet Commonly known as:  ATARAX/VISTARIL Take 10 mg by mouth at bedtime as needed for sleep or anxiety.   LORazepam 0.5 MG tablet Commonly known as:  ATIVAN Take 0.5 mg by mouth at bedtime.   magnesium oxide 400 (241.3 Mg) MG tablet Commonly known as:  MAG-OX Take 1 tablet (400 mg total) by mouth daily.   metFORMIN 1000 MG tablet Commonly known as:  GLUCOPHAGE Take 1,000 mg by mouth daily.   metoprolol tartrate 25 MG tablet Commonly known as:  LOPRESSOR Take 0.5 tablets (12.5 mg total) by mouth 2 (two) times daily.   omeprazole 40 MG capsule Commonly known as:  PRILOSEC Take 40 mg by mouth daily.   oxymetazoline 0.05 % nasal spray Commonly known as:  AFRIN Place 1 spray into both nostrils as needed for congestion.   polyethylene glycol packet Commonly known as:  MIRALAX / GLYCOLAX Take 17 g by mouth daily as needed for mild constipation.   potassium chloride 10 MEQ tablet Commonly known as:  K-DUR,KLOR-CON Take 2 tablets (20 mEq total) by mouth daily.   Rivaroxaban 15 MG Tabs tablet Commonly known as:  XARELTO Take 15 mg by mouth daily with supper.   TYLENOL PM EXTRA STRENGTH 25-500 MG Tabs tablet Generic drug:  diphenhydramine-acetaminophen Take 1 tablet by mouth at bedtime as needed (sleep).      Follow-up Information    Kelton Pillar, MD. Schedule an  appointment as soon as possible for a visit in 1 week(s).   Specialty:  Family Medicine Contact information: [address] [place]  [address] [phone]          Allergies  Allergen Reactions  . Clonidine Swelling and Other (See Comments)    "head swelling"  . Captopril Swelling  . Codeine Nausea And Vomiting and Other (See Comments)    GI Intolerance   . Ceclor [Cefaclor] Rash    You were cared for by a hospitalist during your hospital stay. If you have any questions about your discharge medications or the care you received while you were in the hospital after you are discharged, you can call the unit and asked to speak with the hospitalist on call if the hospitalist that took care of you is not available. Once you are discharged, your primary care physician will handle any further medical issues. Please note that no refills for any discharge medications will be authorized once you are discharged, as it is imperative that you return to your primary care physician (or establish a relationship with a  primary care physician if you do not have one) for your aftercare needs so that they can reassess your need for medications and monitor your lab values.  Consultations:  None   Procedures/Studies: Dg Chest 2 View  Result Date: 09/10/2017 CLINICAL DATA:  Vomiting.  Chest pain fall 3 days ago. EXAM: CHEST - 2 VIEW COMPARISON:  One-view chest x-ray 06/09/2017 and 05/17/2017. FINDINGS: The heart size is normal. Aortic valve replacement is noted. Fractured upper sternal wire is again noted. There is no edema or effusion. No focal airspace disease present. Emphysematous changes are noted. Healed posterior right eighth rib fracture is noted. IMPRESSION: 1. No acute cardiopulmonary disease. 2. Emphysema Electronically Signed   By: San Morelle M.D.   On: 09/10/2017 09:10   Ct Head Wo Contrast  Result Date: 09/10/2017 CLINICAL DATA:  Fall 4 days ago. Head trauma.  Patient is on anticoagulants. Scalp hematoma at that time. EXAM: CT HEAD WITHOUT CONTRAST TECHNIQUE: Contiguous axial images were obtained from the base of the skull through the vertex without intravenous contrast. COMPARISON:  CT head without contrast 09/08/2017 FINDINGS: Brain: Mild atrophy and white matter changes are stable, within normal limits for age. No acute infarct, hemorrhage, or mass lesion is present. No significant extraaxial fluid collection is present. The ventricles are of normal size. Brainstem is normal. A remote lacunar infarct is again seen in the posterior right cerebellum. The cerebellum is otherwise unremarkable. Vascular: Atherosclerotic calcifications are present within the cavernous internal carotid arteries bilaterally. There is no hyperdense vessel. Skull: Calvarium is intact. Left frontal scalp hematoma is stable at 7 mm. There is no underlying fracture. No new scalp hematoma is present. Sinuses/Orbits: Chronic right sphenoid sinus disease is present. The paranasal sinuses and mastoid air cells are otherwise clear. Globes and orbits are with in normal limits for age. Bilateral lens replacements are noted. IMPRESSION: 1. Normal CT appearance of the head for age. 2. Stable left frontal scalp hematoma without underlying fracture. 3. No acute trauma to the head otherwise. Electronically Signed   By: San Morelle M.D.   On: 09/10/2017 11:01   Ct Head Wo Contrast  Result Date: 09/08/2017 CLINICAL DATA:  Pt fell 2 days ago, left facial bruising and hematoma to left side of head, on blood thinners, denies headache/dizziness/blurred vision/nausea, fell forward onto hardwood floor No hx CVA EXAM: CT HEAD WITHOUT CONTRAST CT CERVICAL SPINE WITHOUT CONTRAST TECHNIQUE: Multidetector CT imaging of the head and cervical spine was performed following the standard protocol without intravenous contrast. Multiplanar CT image reconstructions of the cervical spine were also generated.  COMPARISON:  None. FINDINGS: CT HEAD FINDINGS Brain: No acute intracranial hemorrhage. No focal mass lesion. No CT evidence of acute infarction. No midline shift or mass effect. No hydrocephalus. Basilar cisterns are patent. Vascular: No hyperdense vessel or unexpected calcification. Skull: Normal. Negative for fracture or focal lesion. Sinuses/Orbits: Paranasal sinuses and mastoid air cells are clear. Orbits are clear. Other: Small scalp hematoma over the central LEFT frontal bone measures 6 mm in depth. No associated skull fracture. CT CERVICAL SPINE FINDINGS Alignment: Normal alignment of the cervical vertebral bodies. Skull base and vertebrae: Normal craniocervical junction. No loss of vertebral body height or disc height. Normal facet articulation. No evidence of fracture. Soft tissues and spinal canal: No prevertebral soft tissue swelling. No perispinal or epidural hematoma. Disc levels: Endplate spurring and joint space narrowing at C6-C7. Multiple levels of uncovertebral hypertrophy and facet hypertrophy. Upper chest: Clear Other: There is interstitial thickening in  the upper lobes and apical nodularity which is similar to comparison chest radiograph from 06/09/2017 IMPRESSION: 1. No intracranial trauma. 2. Small LEFT frontal scalp hematoma. 3. No skull fracture. 4. No cervical spine fracture 5. Multilevel disc osteophytic disease. 6. Chronic appearing interstitial lung disease in the upper lobes. Findings comparable to chest radiograph 06/09/2017. Electronically Signed   By: Suzy Bouchard M.D.   On: 09/08/2017 17:19   Ct Cervical Spine Wo Contrast  Result Date: 09/08/2017 CLINICAL DATA:  Pt fell 2 days ago, left facial bruising and hematoma to left side of head, on blood thinners, denies headache/dizziness/blurred vision/nausea, fell forward onto hardwood floor No hx CVA EXAM: CT HEAD WITHOUT CONTRAST CT CERVICAL SPINE WITHOUT CONTRAST TECHNIQUE: Multidetector CT imaging of the head and cervical  spine was performed following the standard protocol without intravenous contrast. Multiplanar CT image reconstructions of the cervical spine were also generated. COMPARISON:  None. FINDINGS: CT HEAD FINDINGS Brain: No acute intracranial hemorrhage. No focal mass lesion. No CT evidence of acute infarction. No midline shift or mass effect. No hydrocephalus. Basilar cisterns are patent. Vascular: No hyperdense vessel or unexpected calcification. Skull: Normal. Negative for fracture or focal lesion. Sinuses/Orbits: Paranasal sinuses and mastoid air cells are clear. Orbits are clear. Other: Small scalp hematoma over the central LEFT frontal bone measures 6 mm in depth. No associated skull fracture. CT CERVICAL SPINE FINDINGS Alignment: Normal alignment of the cervical vertebral bodies. Skull base and vertebrae: Normal craniocervical junction. No loss of vertebral body height or disc height. Normal facet articulation. No evidence of fracture. Soft tissues and spinal canal: No prevertebral soft tissue swelling. No perispinal or epidural hematoma. Disc levels: Endplate spurring and joint space narrowing at C6-C7. Multiple levels of uncovertebral hypertrophy and facet hypertrophy. Upper chest: Clear Other: There is interstitial thickening in the upper lobes and apical nodularity which is similar to comparison chest radiograph from 06/09/2017 IMPRESSION: 1. No intracranial trauma. 2. Small LEFT frontal scalp hematoma. 3. No skull fracture. 4. No cervical spine fracture 5. Multilevel disc osteophytic disease. 6. Chronic appearing interstitial lung disease in the upper lobes. Findings comparable to chest radiograph 06/09/2017. Electronically Signed   By: Suzy Bouchard M.D.   On: 09/08/2017 17:19   Ct Abdomen Pelvis W Contrast  Result Date: 09/10/2017 CLINICAL DATA:  Acute abdominal pain, generalized EXAM: CT ABDOMEN AND PELVIS WITH CONTRAST TECHNIQUE: Multidetector CT imaging of the abdomen and pelvis was performed using  the standard protocol following bolus administration of intravenous contrast. CONTRAST:  52mL OMNIPAQUE IOHEXOL 300 MG/ML  SOLN COMPARISON:  01/10/2017 FINDINGS: Lower chest: Chronic small nodules in the right lower lobe, inflammatory based on clustering and prior. Aortic valve replacement. No acute finding. Hepatobiliary: Mass in the lower right lobe measuring 11 mm. Initially there is peripheral nodular enhancement and there is some fill in on the delayed phase, stable from prior and again favoring hemangioma. A smaller and simple cyst is seen in the central right liver.Cholecystectomy with stable intra and extrahepatic bile duct dilatation without visible obstructive process. Pancreas: 2 notable pancreatic cysts, 10 mm in the body and 14 by 9 mm in the uncinate. Even smaller cysts are seen in the body and tail on coronal reformats, marked on series 6. No detected enhancement/nodularity. The pancreas is diffusely atrophic. No main duct dilatation. Spleen: Unremarkable. Adrenals/Urinary Tract: Negative adrenals. No hydronephrosis or stone. Probable mild renal cortical scarring. Full urinary bladder without acute finding. Stomach/Bowel:  No obstruction. No appendicitis. Vascular/Lymphatic: No acute vascular abnormality. Atherosclerotic  calcification. No mass or adenopathy. Reproductive:Dystrophic. Calcification in the uterus considered incidental Other: No ascites or pneumoperitoneum. Musculoskeletal: No acute abnormalities. Osteopenia and focal L5-S1 disc degeneration. IMPRESSION: 1. No acute finding. Negative for bowel obstruction or visible intra-abdominal inflammation. 2. Stable 11 mm lesion in hepatic segment 6, features again suggesting hemangioma. 3. Chronic intra and extrahepatic bile duct dilatation after cholecystectomy, likely reservoir effect given normal biliary labs. 4. Multiple simple appearing pancreatic cysts measuring up to 14 x 9 mm. If appropriate for comorbidities, follow-up pancreas MRI or CT  could be obtained in 2 years. Electronically Signed   By: Monte Fantasia M.D.   On: 09/10/2017 12:44   Dg Abd 2 Views  Result Date: 09/10/2017 CLINICAL DATA:  Nausea and vomiting.  Abdominal pain. EXAM: ABDOMEN - 2 VIEW COMPARISON:  None. FINDINGS: Large amount of stool in the colon. Gas is seen in the rectum. Dilated loops of bowel in the central abdomen measure up to 7.8 cm and may represent sigmoid colon. No free air. IMPRESSION: 1. Gaseous distention of bowel in central abdomen may represent the sigmoid colon. CT abdomen pelvis with contrast would likely be helpful in further evaluation, as clinically indicated. 2. Large amount of stool in the colon is indicative of constipation. Electronically Signed   By: Lorin Picket M.D.   On: 09/10/2017 11:21      Subjective: No complaints, feels back to herself. No nausea, vomiting.   General = no fevers, chills, dizziness, malaise, fatigue HEENT/EYES = negative for pain, redness, loss of vision, double vision, blurred vision, loss of hearing, sore throat, hoarseness, dysphagia Cardiovascular= negative for chest pain, palpitation, murmurs, lower extremity swelling Respiratory/lungs= negative for shortness of breath, cough, hemoptysis, wheezing, mucus production Gastrointestinal= negative for nausea, vomiting,, abdominal pain, melena, hematemesis Genitourinary= negative for Dysuria, Hematuria, Change in Urinary Frequency MSK = Negative for arthralgia, myalgias, Back Pain, Joint swelling  Neurology= Negative for headache, seizures, numbness, tingling  Psychiatry= Negative for anxiety, depression, suicidal and homocidal ideation Allergy/Immunology= Medication/Food allergy as listed  Skin= Negative for Rash, lesions, ulcers, itching   Discharge Exam: Vitals:   09/11/17 0448 09/11/17 0700  BP: (!) 93/56 (!) 85/59  Pulse: 66 64  Resp: 18   Temp: 98.1 F (36.7 C) (!) 97.5 F (36.4 C)  SpO2: 96% 96%   Vitals:   09/11/17 0320 09/11/17 0448  09/11/17 0555 09/11/17 0700  BP: (!) 78/53 (!) 93/56  (!) 85/59  Pulse: 66 66  64  Resp:  18    Temp:  98.1 F (36.7 C)  (!) 97.5 F (36.4 C)  TempSrc:  Axillary  Oral  SpO2:  96%  96%  Weight:   46.1 kg (101 lb 9.6 oz)   Height:   5\' 7"  (1.702 m)     General: Pt is alert, awake, not in acute distress; elderly frail appearing. Ecchymosis on left side of the face from recent fall.  Cardiovascular: RRR, S1/S2 +, no rubs, no gallops Respiratory: CTA bilaterally, no wheezing, no rhonchi Abdominal: Soft, NT, ND, bowel sounds + Extremities: no edema, no cyanosis    The results of significant diagnostics from this hospitalization (including imaging, microbiology, ancillary and laboratory) are listed below for reference.     Microbiology: Recent Results (from the past 240 hour(s))  MRSA PCR Screening     Status: None   Collection Time: 09/10/17  8:40 PM  Result Value Ref Range Status   MRSA by PCR NEGATIVE NEGATIVE Final    Comment:  The GeneXpert MRSA Assay (FDA approved for NASAL specimens only), is one component of a comprehensive MRSA colonization surveillance program. It is not intended to diagnose MRSA infection nor to guide or monitor treatment for MRSA infections. Performed at [facility], [address] [place], [place] [address]      Labs: BNP (last 3 results) Recent Labs    05/15/17 2035 06/09/17 1855  BNP 831.6* 810.1*   Basic Metabolic Panel: Recent Labs  Lab 09/10/17 0803 09/10/17 1641 09/11/17 0140  NA 128* 133* 133*  K 3.1* 3.1* 3.0*  CL 88* 98* 96*  CO2 22 26 26   GLUCOSE 210* 160* 140*  BUN 11 12 7   CREATININE 0.87 0.87 0.78  CALCIUM 9.8 8.1* 8.2*   Liver Function Tests: Recent Labs  Lab 09/10/17 0803  AST 24  ALT 18  ALKPHOS 82  BILITOT 0.9  PROT 8.0  ALBUMIN 4.1   Recent Labs  Lab 09/10/17 0803  LIPASE 24   No results for input(s): AMMONIA in the last 168 hours. CBC: Recent Labs  Lab 09/10/17 0803  09/11/17 0140  WBC 9.0 8.9  HGB 12.5 10.3*  HCT 39.4 32.6*  MCV 82.1 84.5  PLT 403* 312   Cardiac Enzymes: Recent Labs  Lab 09/10/17 2056 09/11/17 0140 09/11/17 0713  TROPONINI <0.03 <0.03 <0.03   BNP: Invalid input(s): POCBNP CBG: Recent Labs  Lab 09/10/17 1640 09/10/17 2246 09/11/17 0810  GLUCAP 156* 130* 97   D-Dimer No results for input(s): DDIMER in the last 72 hours. Hgb A1c No results for input(s): HGBA1C in the last 72 hours. Lipid Profile No results for input(s): CHOL, HDL, LDLCALC, TRIG, CHOLHDL, LDLDIRECT in the last 72 hours. Thyroid function studies No results for input(s): TSH, T4TOTAL, T3FREE, THYROIDAB in the last 72 hours.  Invalid input(s): FREET3 Anemia work up No results for input(s): VITAMINB12, FOLATE, FERRITIN, TIBC, IRON, RETICCTPCT in the last 72 hours. Urinalysis    Component Value Date/Time   COLORURINE STRAW (A) 09/10/2017 1413   APPEARANCEUR CLEAR 09/10/2017 1413   LABSPEC 1.017 09/10/2017 1413   PHURINE 8.0 09/10/2017 1413   GLUCOSEU 150 (A) 09/10/2017 1413   HGBUR NEGATIVE 09/10/2017 1413   BILIRUBINUR NEGATIVE 09/10/2017 1413   KETONESUR 20 (A) 09/10/2017 1413   PROTEINUR 30 (A) 09/10/2017 1413   NITRITE NEGATIVE 09/10/2017 1413   LEUKOCYTESUR SMALL (A) 09/10/2017 1413   Sepsis Labs Invalid input(s): PROCALCITONIN,  WBC,  LACTICIDVEN Microbiology Recent Results (from the past 240 hour(s))  MRSA PCR Screening     Status: None   Collection Time: 09/10/17  8:40 PM  Result Value Ref Range Status   MRSA by PCR NEGATIVE NEGATIVE Final    Comment:        The GeneXpert MRSA Assay (FDA approved for NASAL specimens only), is one component of a comprehensive MRSA colonization surveillance program. It is not intended to diagnose MRSA infection nor to guide or monitor treatment for MRSA infections. Performed at [facility], [place] [address] [place], [place] [address]      Time coordinating discharge:  I have spent  35 minutes face to face with the patient and on the ward discussing the patients care, assessment, plan and disposition with other care givers. >50% of the time was devoted counseling the patient about the risks and benefits of treatment/Discharge disposition and coordinating care.   SIGNED:   Damita Lack, MD  [facility] 09/11/2017, 11:16 AM Pager   If 7PM-7AM, please contact  night-coverage [email or web address] Password [email or web address]

## 2017-09-11 NOTE — Care Management Obs Status (Signed)
[facility] NOTIFICATION   Patient Details  Name: Dakoda Bassette MRN: [id number] Date of Birth: [birth year]   Medicare Observation Status Notification Given:  Yes    Sharin Mons, RN 09/11/2017, 2:17 PM

## 2017-09-12 ENCOUNTER — Ambulatory Visit (INDEPENDENT_AMBULATORY_CARE_PROVIDER_SITE_OTHER): Payer: Medicare Other | Admitting: *Deleted

## 2017-09-12 ENCOUNTER — Telehealth: Payer: Self-pay | Admitting: Cardiology

## 2017-09-12 ENCOUNTER — Ambulatory Visit: Payer: Medicare Other

## 2017-09-12 DIAGNOSIS — I480 Paroxysmal atrial fibrillation: Secondary | ICD-10-CM

## 2017-09-12 NOTE — Telephone Encounter (Signed)
LMOVM reminding pt to send remote transmission.   

## 2017-09-13 NOTE — Progress Notes (Signed)
Carelink Summary Report / Loop Recorder 

## 2017-09-20 ENCOUNTER — Ambulatory Visit (INDEPENDENT_AMBULATORY_CARE_PROVIDER_SITE_OTHER): Payer: Self-pay | Admitting: *Deleted

## 2017-09-20 DIAGNOSIS — I480 Paroxysmal atrial fibrillation: Secondary | ICD-10-CM

## 2017-09-20 LAB — CUP PACEART INCLINIC DEVICE CHECK
Date Time Interrogation Session: 20190530165930
Implantable Pulse Generator Implant Date: 20190501

## 2017-09-20 NOTE — Progress Notes (Signed)
Pacemaker check in clinic. Normal device function. Thresholds, sensing, impedances consistent with previous measurements. Loop check in clinic. Battery status: good. R-waves 0.65mV. 0 symptom episodes, 0 tachy episodes, 4 pause episodes - undersensing, 0 brady episodes. 0 AF episodes. Monthly summary reports and ROV with JA 6/5

## 2017-09-26 ENCOUNTER — Ambulatory Visit (INDEPENDENT_AMBULATORY_CARE_PROVIDER_SITE_OTHER): Payer: Medicare Other | Admitting: Internal Medicine

## 2017-09-26 ENCOUNTER — Encounter: Payer: Self-pay | Admitting: Internal Medicine

## 2017-09-26 VITALS — BP 132/74 | HR 89 | Ht 67.0 in | Wt 106.0 lb

## 2017-09-26 DIAGNOSIS — I4819 Other persistent atrial fibrillation: Secondary | ICD-10-CM

## 2017-09-26 DIAGNOSIS — I10 Essential (primary) hypertension: Secondary | ICD-10-CM

## 2017-09-26 DIAGNOSIS — I481 Persistent atrial fibrillation: Secondary | ICD-10-CM

## 2017-09-26 MED ORDER — AMIODARONE HCL 200 MG PO TABS: 100.0000 mg | ORAL_TABLET | Freq: Every day | ORAL | 3 refills | Status: DC

## 2017-09-26 NOTE — Patient Instructions (Addendum)
Medication Instructions:  Your physician has recommended you make the following change in your medication:  1.  Reduce your amiodarone 200 mg tablets- Take 1/2 tablet (100 mg) by mouth once daily.  Labwork: None ordered.  Testing/Procedures: None ordered.  Follow-Up: Your physician wants you to follow-up in: 3 months with Tommye Standard, PA.  Any Other Special Instructions Will Be Listed Below (If Applicable).  Dr. [facility]nee [facility]G [facility]  If you need a refill on your cardiac medications before your next appointment, please call your pharmacy.

## 2017-09-26 NOTE — Progress Notes (Signed)
PCP: Kelton Pillar, MD Primary Cardiologist: Dr Johnsie Cancel Primary EP:  Dr Rayann Heman  Angela Hines is a 84 y.o. female who presents today for routine electrophysiology followup.  Since last being seen in our clinic, the patient reports doing reasonably well.  She tripped several weeks ago getting out of bed and fell on her face.  She had significant facial ecchymosis but is improving.  She also has a chronic tremor, for which she saw neurology at [place] 20 years ago.  She thinks that this has recently worsened.  Today, she denies symptoms of palpitations, chest pain, shortness of breath,  lower extremity edema, dizziness, presyncope, or syncope.  The patient is otherwise without complaint today.   Past Medical History:  Diagnosis Date  . Atrial fibrillation ([place])   . CHF (congestive heart failure) ([place])   . COPD (chronic obstructive pulmonary disease) ([place])   . Diabetes mellitus without complication ([place])   . High cholesterol   . Hypertension    Past Surgical History:  Procedure Laterality Date  . CARDIAC PACEMAKER PLACEMENT  07/30/2014  . 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 CARDIOVERSION N/A 06/15/2017   Procedure: CARDIOVERSION;  Surgeon: Lelon Perla, MD;  Location: [facility] ENDOSCOPY;  Service: Cardiovascular;  Laterality: N/A;  . CHOLECYSTECTOMY    . EXCISIONAL HEMORRHOIDECTOMY    . LOOP RECORDER INSERTION N/A 08/22/2017   Procedure: LOOP RECORDER INSERTION;  Surgeon: [name], MD;  Location: [place] CV LAB;  Service: Cardiovascular;  Laterality: N/A;  . LOOP RECORDER REMOVAL N/A 08/22/2017   Procedure: LOOP RECORDER REMOVAL;  Surgeon: [name], MD;  Location: [place] CV LAB;  Service: Cardiovascular;  Laterality: N/A;    ROS- all systems are reviewed and negative except as per HPI above  Current Outpatient Medications  Medication Sig Dispense Refill  . albuterol (PROVENTIL HFA;VENTOLIN HFA) 108 (90 Base) MCG/ACT inhaler Inhale 2 puffs into the  lungs every 6 (six) hours as needed for shortness of breath.    Marland Kitchen amiodarone (PACERONE) 200 MG tablet Take 1 tablet (200 mg total) by mouth daily. 90 tablet 2  . bismuth subsalicylate (PEPTO BISMOL) 262 MG/15ML suspension Take 30 mLs by mouth every 6 (six) hours as needed for indigestion.    Marland Kitchen buPROPion (WELLBUTRIN XL) 150 MG 24 hr tablet Take 150 mg by mouth daily.  5  . diphenhydramine-acetaminophen (TYLENOL PM EXTRA STRENGTH) 25-500 MG TABS tablet Take 1 tablet by mouth at bedtime as needed (sleep).    . feeding supplement, ENSURE ENLIVE, (ENSURE ENLIVE) LIQD Take 237 mLs by mouth 3 (three) times daily between meals. (Patient taking differently: Take 237 mLs by mouth daily. ) 237 mL 12  . FLUoxetine (PROZAC) 10 MG capsule Take 10 mg by mouth daily.  2  . fluticasone (FLONASE) 50 MCG/ACT nasal spray Place 1 spray into both nostrils as needed for allergies or rhinitis.    . furosemide (LASIX) 20 MG tablet Take 2 tablets (40 mg total) by mouth daily. 30 tablet 0  . gabapentin (NEURONTIN) 300 MG capsule Take 600 mg by mouth at bedtime.     . hydrOXYzine (ATARAX/VISTARIL) 10 MG tablet Take 10 mg by mouth at bedtime as needed for sleep or anxiety.  2  . LORazepam (ATIVAN) 0.5 MG tablet Take 0.5 mg by mouth at bedtime.     . magnesium oxide (MAG-OX) 400 (241.3 Mg) MG tablet Take 1 tablet (400 mg total) by mouth daily. 30 tablet 0  .  metFORMIN (GLUCOPHAGE) 1000 MG tablet Take 1,000 mg by mouth daily.    . metoprolol tartrate (LOPRESSOR) 25 MG tablet Take 0.5 tablets (12.5 mg total) by mouth 2 (two) times daily. (Patient taking differently: Take 25 mg by mouth daily. ) 345 tablet 3  . omeprazole (PRILOSEC) 40 MG capsule Take 40 mg by mouth daily.     Marland Kitchen oxymetazoline (AFRIN) 0.05 % nasal spray Place 1 spray into both nostrils as needed for congestion.    . polyethylene glycol (MIRALAX / GLYCOLAX) packet Take 17 g by mouth daily as needed for mild constipation. 14 each 0  . potassium chloride  (K-DUR,KLOR-CON) 10 MEQ tablet Take 2 tablets (20 mEq total) by mouth daily. 30 tablet 0  . Rivaroxaban (XARELTO) 15 MG TABS tablet Take 15 mg by mouth daily with supper.     No current facility-administered medications for this visit.     Physical Exam: Vitals:   09/26/17 1431  BP: 132/74  Pulse: 89  Weight: 106 lb (48.1 kg)  Height: 5\' 7"  (1.702 m)    GEN- The patient is well appearing, alert and oriented x 3 today.   Head- extensive ecchymosis over her face Eyes-  Sclera clear, conjunctiva pink Ears- hearing intact Oropharynx- clear Lungs- Clear to ausculation bilaterally, normal work of breathing Chest- ILR pocket is well healed Heart- Regular rate and rhythm, 3/6 SEM LUSB which is late peaking GI- soft, NT, ND, + BS Extremities- no clubbing, cyanosis, or edema  ILR interrogation- reviewed in detail today,  See PACEART report  ekg tracing ordered today is personally reviewed and shows sinus rhythm, LAHB, LVH  Assessment and Plan:  1. Persistent afib No afib since ILR reimplantation See Pace Art report No changes today Continue xarelto for chads2vasc score of 6.  Given very low afib burden, would have a low threshold to stop xarelto.  We could restart it if her afib returns. Given tremors and no recent afib, will reduce amiodarone to 100mg  daily today  2. HTN Stable No change required today  3. S/p AVR Per Dr Johnsie Cancel Echo 06/11/17 reviewed and elevated gradient/ velocities noted  4. Tremor Will reduce amiodarone to 100mg  daily though her tremor is chronic and proceeds initiation of amiodarone.    [facility] Return to see EP PA in 3 months Follow-up with Dr Johnsie Cancel as scheduled  [name] MD, [facility] 09/26/2017 2:48 PM

## 2017-10-08 LAB — CUP PACEART REMOTE DEVICE CHECK
MDC IDC PG IMPLANT DT: 20190501
MDC IDC SESS DTM: 20190523013610

## 2017-10-09 LAB — CUP PACEART INCLINIC DEVICE CHECK
Date Time Interrogation Session: 20190618171653
MDC IDC PG IMPLANT DT: 20190501

## 2017-10-15 ENCOUNTER — Ambulatory Visit (INDEPENDENT_AMBULATORY_CARE_PROVIDER_SITE_OTHER): Payer: Medicare Other | Admitting: *Deleted

## 2017-10-15 DIAGNOSIS — I481 Persistent atrial fibrillation: Secondary | ICD-10-CM

## 2017-10-15 DIAGNOSIS — I4819 Other persistent atrial fibrillation: Secondary | ICD-10-CM

## 2017-10-16 NOTE — Progress Notes (Signed)
Carelink Summary Report / Loop Recorder 

## 2017-10-23 ENCOUNTER — Encounter: Payer: Self-pay | Admitting: Cardiology

## 2017-10-23 ENCOUNTER — Ambulatory Visit (INDEPENDENT_AMBULATORY_CARE_PROVIDER_SITE_OTHER): Payer: Medicare Other | Admitting: Cardiology

## 2017-10-23 VITALS — BP 186/98 | HR 67 | Ht 67.0 in | Wt 110.8 lb

## 2017-10-23 DIAGNOSIS — I5022 Chronic systolic (congestive) heart failure: Secondary | ICD-10-CM | POA: Diagnosis not present

## 2017-10-23 DIAGNOSIS — I480 Paroxysmal atrial fibrillation: Secondary | ICD-10-CM

## 2017-10-23 DIAGNOSIS — I10 Essential (primary) hypertension: Secondary | ICD-10-CM

## 2017-10-23 DIAGNOSIS — Z952 Presence of prosthetic heart valve: Secondary | ICD-10-CM

## 2017-10-23 MED ORDER — AMLODIPINE BESYLATE 2.5 MG PO TABS: 2.5000 mg | ORAL_TABLET | Freq: Every day | ORAL | 3 refills | Status: DC

## 2017-10-23 MED ORDER — FUROSEMIDE 20 MG PO TABS: 20.0000 mg | ORAL_TABLET | Freq: Every day | ORAL | 3 refills | Status: DC

## 2017-10-23 NOTE — Progress Notes (Signed)
Cardiology Office Note:    Date:  10/23/2017   ID:  Angela Hines, DOB 1932/06/18, MRN [id number]  PCP:  Kelton Pillar, MD  Cardiologist:  Jenkins Rouge, MD  Electrophysiologist: Dr. Rayann Heman  Referring MD: Kelton Pillar, MD   Chief Complaint  Patient presents with  . Hypertension    History of Present Illness:    Angela Hines is a 84 y.o. female with a past medical history significant for paroxysmal atrial fibrillation on Sotalol and Xarelto, CHF, bioprosthetic AVR 2005, COPD, diabetes type 2, hypertension and hyperlipidemia. Angela Hines relocated from the [place] fear area and establish care with Dr. Johnsie Cancel in 01/2017.  She has a history of PAF with TEE/DCC April 2016 with loop recorder placed for syncope, replaced in 08/2017. PAF is treated with sotalol and coumadin. She had a 21 mm Edwards Pericardial 2700 series bioprosthetic AVR by Dr Evelina Dun in September 2005.  The patient was recently admitted to the hospital 09/10/2017 -09/11/2017 with nausea vomiting and atypical chest pain.  She was hypotensive, improved with IV fluids. She was discharged with instructions to maintain hydration.  She was advised to use Lasix with caution at home due to risk of dehydration. She was also seen in the ED on 09/08/17 after a fall which resulted in significant ecchymosis of the face (her daughter in law has a picture on her phone which is quite striking). CT of the head and neck showed no acute abnormality.   Ms Hines is here today for evaluation of uncontrolled hypertension with her son and daughter-in-law. She tells me about her fall last month. Angela Hines reports that she had been asleep in bed, woke up and tried to get up, started to walk and fell. She does not recall any lightheadedness, blacking out, palpitations, chest pain. She hit her face on the floor and she was able to get up on her own. She has had another fall early this year when she tripped. Her son says that she has fallen several times in  the past year. She lives at [facility] in independent care, gets PT and medications given. Her family repots that she gets mildly confused at times.   There has been concern about her not getting enough to drink or eat. I do not see any low blood pressures or significant lightheadedness to suggest hypotension related falling. Her BP has actually been running on the higher side.  Dr. Rayann Heman increased the metoprolol. Dr. Laurann Montana decreased the amiodarone to 100 mg daily. She has been havign no palpitations or awareness of any recurrent afib.   They have a BP log with them.  Home BPs per log 130's-160's/80's-90's with occasional 180/110. All reading  are in the am before her meds.   She has an intermittent had tremor that sometimes interferes with holding things. This has been present for about 15 years.   Drinks ensure and several glasses of orange juice per day. She is eating better now that one of her sons is staying with her.    Past Medical History:  Diagnosis Date  . Atrial fibrillation ([place])   . CHF (congestive heart failure) ([place])   . COPD (chronic obstructive pulmonary disease) ([place])   . Diabetes mellitus without complication ([place])   . High cholesterol   . Hypertension     Past Surgical History:  Procedure Laterality Date  . CARDIAC PACEMAKER PLACEMENT  07/30/2014  . 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 CARDIOVERSION N/A 06/15/2017  Procedure: CARDIOVERSION;  Surgeon: Lelon Perla, MD;  Location: [facility] ENDOSCOPY;  Service: Cardiovascular;  Laterality: N/A;  . CHOLECYSTECTOMY    . EXCISIONAL HEMORRHOIDECTOMY    . LOOP RECORDER INSERTION N/A 08/22/2017   Procedure: LOOP RECORDER INSERTION;  Surgeon: [name], MD;  Location: [place] CV LAB;  Service: Cardiovascular;  Laterality: N/A;  . LOOP RECORDER REMOVAL N/A 08/22/2017   Procedure: LOOP RECORDER REMOVAL;  Surgeon: [name], MD;  Location: [place] CV LAB;  Service: Cardiovascular;  Laterality:  N/A;    Current Medications: Current Meds  Medication Sig  . albuterol (PROVENTIL HFA;VENTOLIN HFA) 108 (90 Base) MCG/ACT inhaler Inhale 2 puffs into the lungs every 6 (six) hours as needed for shortness of breath.  Marland Kitchen amiodarone (PACERONE) 200 MG tablet Take 0.5 tablets (100 mg total) by mouth daily.  Marland Kitchen bismuth subsalicylate (PEPTO BISMOL) 262 MG/15ML suspension Take 30 mLs by mouth every 6 (six) hours as needed for indigestion.  Marland Kitchen buPROPion (WELLBUTRIN XL) 150 MG 24 hr tablet Take 150 mg by mouth daily.  . diphenhydramine-acetaminophen (TYLENOL PM EXTRA STRENGTH) 25-500 MG TABS tablet Take 1 tablet by mouth at bedtime as needed (sleep).  . feeding supplement, ENSURE ENLIVE, (ENSURE ENLIVE) LIQD Take 237 mLs by mouth 3 (three) times daily between meals. (Patient taking differently: Take 237 mLs by mouth daily. )  . FLUoxetine (PROZAC) 10 MG capsule Take 10 mg by mouth daily.  . fluticasone (FLONASE) 50 MCG/ACT nasal spray Place 1 spray into both nostrils as needed for allergies or rhinitis.  . furosemide (LASIX) 20 MG tablet Take 1 tablet (20 mg total) by mouth daily.  Marland Kitchen gabapentin (NEURONTIN) 300 MG capsule Take 600 mg by mouth at bedtime.   . hydrOXYzine (ATARAX/VISTARIL) 10 MG tablet Take 10 mg by mouth at bedtime as needed for sleep or anxiety.  Marland Kitchen LORazepam (ATIVAN) 0.5 MG tablet Take 0.5 mg by mouth at bedtime.   . magnesium oxide (MAG-OX) 400 (241.3 Mg) MG tablet Take 1 tablet (400 mg total) by mouth daily.  . metFORMIN (GLUCOPHAGE) 1000 MG tablet Take 1,000 mg by mouth daily.  . metoprolol tartrate (LOPRESSOR) 25 MG tablet Take 25 mg by mouth 2 (two) times daily.  Marland Kitchen omeprazole (PRILOSEC) 40 MG capsule Take 40 mg by mouth daily.   Marland Kitchen oxymetazoline (AFRIN) 0.05 % nasal spray Place 1 spray into both nostrils as needed for congestion.  . polyethylene glycol (MIRALAX / GLYCOLAX) packet Take 17 g by mouth daily as needed for mild constipation.  . potassium chloride (K-DUR,KLOR-CON) 10 MEQ  tablet Take 2 tablets (20 mEq total) by mouth daily.  . Rivaroxaban (XARELTO) 15 MG TABS tablet Take 15 mg by mouth daily with supper.  . [DISCONTINUED] furosemide (LASIX) 20 MG tablet Take 2 tablets (40 mg total) by mouth daily.     Allergies:   Clonidine; Captopril; Codeine; and Ceclor [cefaclor]   Social History   Socioeconomic History  . Marital status: Single    Spouse name: Not on file  . Number of children: Not on file  . Years of education: Not on file  . Highest education level: Not on file  Occupational History  . Not on file  Social Needs  . Financial resource strain: Not on file  . Food insecurity:    Worry: Not on file    Inability: Not on file  . Transportation needs:    Medical: Not on file    Non-medical: Not on file  Tobacco Use  .  Smoking status: Never Smoker  . Smokeless tobacco: Never Used  Substance and Sexual Activity  . Alcohol use: No  . Drug use: No  . Sexual activity: Never  Lifestyle  . Physical activity:    Days per week: Not on file    Minutes per session: Not on file  . Stress: Not on file  Relationships  . Social connections:    Talks on phone: Not on file    Gets together: Not on file    Attends religious service: Not on file    Active member of club or organization: Not on file    Attends meetings of clubs or organizations: Not on file    Relationship status: Not on file  Other Topics Concern  . Not on file  Social History Narrative  . Not on file     Family History: The patient's family history includes Diabetes in her son; Emphysema in her father; Heart attack in her son; Heart disease in her son; Hypertension in her brother, brother, and sister; Lung disease in her sister; Multiple myeloma in her son; Other in her son; Stroke in her brother. ROS:   Please see the history of present illness.     All other systems reviewed and are negative.  EKGs/Labs/Other Studies Reviewed:    The following studies were reviewed  today:  Echocardiogram 06/11/2017 Study Conclusions  - Left ventricle: The cavity size was normal. There was moderate   concentric hypertrophy. Systolic function was mildly to   moderately reduced. The estimated ejection fraction was in the   range of 40% to 45%. Diffuse hypokinesis. - Aortic valve: A bioprosthetic valve is present in the aortic   position. There was moderate stenosis. Valve area (VTI): 0.31   cm^2. Valve area (Vmax): 0.39 cm^2. Valve area (Vmean): 0.39   cm^2. - Mitral valve: There was moderate regurgitation directed   posteriorly. - Left atrium: The atrium was moderately dilated. - Right ventricle: Systolic function was moderately reduced. - Tricuspid valve: There was moderate regurgitation. - Pulmonary arteries: Systolic pressure was mildly increased. PA   peak pressure: 33 mm Hg (S). - Inferior vena cava: The vessel was dilated. The respirophasic   diameter changes were blunted (< 50%), consistent with elevated   central venous pressure.  Impressions: - No prior echocardiogram is available for comparison.   LVEF is mildly to moderately decrased, LVEF 40-45%.   RVEF is moderately decreased.   Velocities and gradients across the bioprosthetic aortic valve   are elevated (2.9 m/sec, mean gradient 21 mmHg).   Moderate mitral and tricuspid regurgitation.   EKG:  EKG is not ordered today.    Recent Labs: 05/22/2017: TSH 0.659 06/09/2017: B Natriuretic Peptide 626.8 06/12/2017: Magnesium 1.9 09/10/2017: ALT 18 09/11/2017: BUN 7; Creatinine, Ser 0.78; Hemoglobin 10.3; Platelets 312; Potassium 3.0; Sodium 133   Recent Lipid Panel No results found for: CHOL, TRIG, HDL, CHOLHDL, VLDL, LDLCALC, LDLDIRECT  Physical Exam:    VS:  BP (!) 186/98   Pulse 67   Ht 5' 7"  (1.702 m)   Wt 110 lb 12.8 oz (50.3 kg)   SpO2 97%   BMI 17.35 kg/m     Wt Readings from Last 3 Encounters:  10/23/17 110 lb 12.8 oz (50.3 kg)  09/26/17 106 lb (48.1 kg)  09/11/17 101 lb 9.6 oz  (46.1 kg)     Physical Exam  Constitutional: She is oriented to person, place, and time. She appears well-developed.  Thin, frail female  HENT:  Head: Normocephalic and atraumatic.  Neck: Normal range of motion. Neck supple. No JVD present.  Cardiovascular: Normal rate, regular rhythm and intact distal pulses. Exam reveals no gallop and no friction rub.  Murmur heard.  Harsh midsystolic murmur is present with a grade of 3/6 at the upper right sternal border radiating to the neck. Pulmonary/Chest: Effort normal and breath sounds normal. No respiratory distress. She has no wheezes. She has no rales.  Abdominal: Soft. Bowel sounds are normal.  Musculoskeletal: Normal range of motion. She exhibits no edema.  Neurological: She is alert and oriented to person, place, and time.  Skin: Skin is warm and dry.  Psychiatric: She has a normal mood and affect. Her behavior is normal. Thought content normal.  Vitals reviewed.    ASSESSMENT:    1. AF (paroxysmal atrial fibrillation) ([place])   2. Chronic systolic CHF (congestive heart failure) ([place])   3. Essential hypertension   4. S/P AVR (aortic valve replacement)    PLAN:    In order of problems listed above:  Hypertension: Lasix 40 mg daily, metoprolol 12.5 mg twice daily.  Normal renal function on labs 08/2017. BP has been elevated. Recent visit with dehydration and pt has had falls. She has no history of volume overload. Will decrease her lasix and add low dose amlodipine for improved BP control with room to increase as needed. She has a documented allergy to an ACE-I.  Has PCP visit scheduled for next week and the amlodipine can be titrated if BP still elevated.   AVR: Bioprosthetic aortic valve replacement in 2005.  Noted to have elevated velocities and gradients across the bioprosthetic 05/2017.  Advised follow-up echo in 1 year by Dr. Johnsie Cancel. No chest pain, heart failure symptoms or syncope. (her fall does not appear to be related to  syncope)  Paroxysmal atrial fibrillation: Maintaining sinus rhythm on amiodarone and low-dose metoprolol. No afib recently on loop recorder, last interrogation was 10/09/17. Last afib was in March. the patient is anticoagulated with Xarelto. With her repeated falls, we may have to consider the risks and benefits of continuing anticoagulation. I briefly brought this up tot he patient and her family. We will continue A/C for stroke risk reduction for now.   Chronic systolic heart failure: EF 40-45% 05/2017.  On beta-blocker and Lasix.  No ACE-I dt allergy. Recently admitted to the hospital with dehydration.  Cautioned on use of Lasix. Appears euvolemic today. Will decrease lasix from 40 mg to 20 mg.    Medication Adjustments/Labs and Tests Ordered: Current medicines are reviewed at length with the patient today.  Concerns regarding medicines are outlined above. Labs and tests ordered and medication changes are outlined in the patient instructions below:  Patient Instructions  Medication Instructions: Your physician has recommended you make the following change in your medication:  DECREASE: Lasix to 20 mg daily START: Amlodipine 2.5 mg daily    Labwork: None Ordered  Procedures/Testing: None Ordered  Follow-Up: Your physician recommends that you schedule a follow-up appointment in: 1 month with Dr.Nishan or Daune Perch NP   Any Additional Special Instructions Will Be Listed Below (If Applicable).     If you need a refill on your cardiac medications before your next appointment, please call your pharmacy.      Signed, Daune Perch, NP  10/23/2017 6:45 PM    [place]

## 2017-10-23 NOTE — Patient Instructions (Signed)
Medication Instructions: Your physician has recommended you make the following change in your medication:  DECREASE: Lasix to 20 mg daily START: Amlodipine 2.5 mg daily    Labwork: None Ordered  Procedures/Testing: None Ordered  Follow-Up: Your physician recommends that you schedule a follow-up appointment in: 1 month with Dr.Nishan or Daune Perch NP   Any Additional Special Instructions Will Be Listed Below (If Applicable).     If you need a refill on your cardiac medications before your next appointment, please call your pharmacy.

## 2017-11-13 ENCOUNTER — Emergency Department (HOSPITAL_COMMUNITY): Payer: Medicare Other

## 2017-11-13 ENCOUNTER — Encounter (HOSPITAL_COMMUNITY): Payer: Self-pay | Admitting: Emergency Medicine

## 2017-11-13 ENCOUNTER — Emergency Department (HOSPITAL_COMMUNITY)
Admission: EM | Admit: 2017-11-13 | Discharge: 2017-11-14 | Disposition: A | Discharge status: Home / Self Care | Payer: Medicare Other | Attending: Emergency Medicine | Admitting: Emergency Medicine

## 2017-11-13 DIAGNOSIS — Y9389 Activity, other specified: Secondary | ICD-10-CM | POA: Insufficient documentation

## 2017-11-13 DIAGNOSIS — I110 Hypertensive heart disease with heart failure: Secondary | ICD-10-CM | POA: Diagnosis not present

## 2017-11-13 DIAGNOSIS — E119 Type 2 diabetes mellitus without complications: Secondary | ICD-10-CM | POA: Diagnosis not present

## 2017-11-13 DIAGNOSIS — Z950 Presence of cardiac pacemaker: Secondary | ICD-10-CM | POA: Insufficient documentation

## 2017-11-13 DIAGNOSIS — Y998 Other external cause status: Secondary | ICD-10-CM | POA: Diagnosis not present

## 2017-11-13 DIAGNOSIS — W01198A Fall on same level from slipping, tripping and stumbling with subsequent striking against other object, initial encounter: Secondary | ICD-10-CM | POA: Diagnosis not present

## 2017-11-13 DIAGNOSIS — Z79899 Other long term (current) drug therapy: Secondary | ICD-10-CM | POA: Insufficient documentation

## 2017-11-13 DIAGNOSIS — J449 Chronic obstructive pulmonary disease, unspecified: Secondary | ICD-10-CM | POA: Diagnosis not present

## 2017-11-13 DIAGNOSIS — Z7901 Long term (current) use of anticoagulants: Secondary | ICD-10-CM | POA: Insufficient documentation

## 2017-11-13 DIAGNOSIS — Y92013 Bedroom of single-family (private) house as the place of occurrence of the external cause: Secondary | ICD-10-CM | POA: Insufficient documentation

## 2017-11-13 DIAGNOSIS — I5022 Chronic systolic (congestive) heart failure: Secondary | ICD-10-CM | POA: Insufficient documentation

## 2017-11-13 DIAGNOSIS — Z7984 Long term (current) use of oral hypoglycemic drugs: Secondary | ICD-10-CM | POA: Diagnosis not present

## 2017-11-13 DIAGNOSIS — W19XXXA Unspecified fall, initial encounter: Secondary | ICD-10-CM

## 2017-11-13 DIAGNOSIS — Z043 Encounter for examination and observation following other accident: Secondary | ICD-10-CM | POA: Insufficient documentation

## 2017-11-13 NOTE — ED Triage Notes (Signed)
Pt presents with injuries from a fall where she was trying to get out of bed, mechanical fall, fell back and struck posterior head; pt on thinners for Afib; no obvious hematoma or bleeding noted to head; pt A+O and denies LOC

## 2017-11-14 MED ORDER — LORAZEPAM 0.5 MG PO TABS: 0.5000 mg | ORAL_TABLET | Freq: Three times a day (TID) | ORAL | 0 refills | Status: DC | PRN

## 2017-11-14 NOTE — Discharge Instructions (Addendum)
You can be discharged home and should follow up with your doctor as needed. Tylenol for any soreness that may develop as the result of this most recent fall.

## 2017-11-14 NOTE — ED Provider Notes (Signed)
[facility] EMERGENCY DEPARTMENT Provider Note   CSN: [id number] Arrival date & time: 11/13/17  2111     History   Chief Complaint Chief Complaint  Patient presents with  . Fall    HPI Angela Hines is a 84 y.o. female.  Patient here for evaluation after a fall that occurred while at home tonight around 7:00 pm. She states she got up from her bed, using her walker as usual, and lost her balance, falling backward. She hit her head against the door frame. Her son was immediately available and confirms there was no LOC. She has been awake, alert and at baseline, per son, since the fall. No nausea, vomiting, neck pain, chest/abdominal pain. She has not been ambulatory since the fall.   The history is provided by the patient and a relative. No language interpreter was used.  Fall  Pertinent negatives include no chest pain, no abdominal pain, no headaches and no shortness of breath.    Past Medical History:  Diagnosis Date  . Atrial fibrillation ([place])   . CHF (congestive heart failure) ([place])   . COPD (chronic obstructive pulmonary disease) ([place])   . Diabetes mellitus without complication ([place])   . High cholesterol   . Hypertension     Patient Active Problem List   Diagnosis Date Noted  . Nausea & vomiting 09/10/2017  . Chest pain 09/10/2017  . High anion gap metabolic acidosis 31/54/0086  . Chronic systolic CHF (congestive heart failure) ([place]) 09/10/2017  . Protein-calorie malnutrition, severe 06/11/2017  . Prolonged QT interval   . Diabetes mellitus ([place]) 07/17/2016  . Gastroesophageal reflux disease without esophagitis 07/17/2016  . Generalized anxiety disorder 07/17/2016  . Hypercholesterolemia 07/17/2016  . Hypertension 07/17/2016  . Status post placement of implantable loop recorder 07/17/2016  . AF (paroxysmal atrial fibrillation) ([place]) 07/17/2016  . Primary insomnia 07/17/2016  . S/P AVR (aortic valve replacement) 07/17/2016  . History of  syncope 08/07/2014    Past Surgical History:  Procedure Laterality Date  . CARDIAC PACEMAKER PLACEMENT  07/30/2014  . 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 CARDIOVERSION N/A 06/15/2017   Procedure: CARDIOVERSION;  Surgeon: Lelon Perla, MD;  Location: [facility] ENDOSCOPY;  Service: Cardiovascular;  Laterality: N/A;  . CHOLECYSTECTOMY    . EXCISIONAL HEMORRHOIDECTOMY    . LOOP RECORDER INSERTION N/A 08/22/2017   Procedure: LOOP RECORDER INSERTION;  Surgeon: [name], MD;  Location: [place] CV LAB;  Service: Cardiovascular;  Laterality: N/A;  . LOOP RECORDER REMOVAL N/A 08/22/2017   Procedure: LOOP RECORDER REMOVAL;  Surgeon: [name], MD;  Location: [place] CV LAB;  Service: Cardiovascular;  Laterality: N/A;     OB History   None      Home Medications    Prior to Admission medications   Medication Sig Start Date End Date Taking? Authorizing Provider  albuterol (PROVENTIL HFA;VENTOLIN HFA) 108 (90 Base) MCG/ACT inhaler Inhale 2 puffs into the lungs every 6 (six) hours as needed for shortness of breath.    [provider]  amiodarone (PACERONE) 200 MG tablet Take 0.5 tablets (100 mg total) by mouth daily. 09/26/17   Allred, Jeneen Rinks, MD  amLODipine (NORVASC) 2.5 MG tablet Take 1 tablet (2.5 mg total) by mouth daily. 10/23/17 01/21/18  Daune Perch, NP  bismuth subsalicylate (PEPTO BISMOL) 262 MG/15ML suspension Take 30 mLs by mouth every 6 (six) hours as needed for indigestion.    [provider]  buPROPion [facility]  XL) 150 MG 24 hr tablet Take 150 mg by mouth daily. 06/20/17   [provider]  diphenhydramine-acetaminophen (TYLENOL PM EXTRA STRENGTH) 25-500 MG TABS tablet Take 1 tablet by mouth at bedtime as needed (sleep).    [provider]  feeding supplement, ENSURE ENLIVE, (ENSURE ENLIVE) LIQD Take 237 mLs by mouth 3 (three) times daily between meals. Patient taking differently: Take 237 mLs by mouth daily.  06/12/17    Lavina Hamman, MD  FLUoxetine (PROZAC) 10 MG capsule Take 10 mg by mouth daily. 08/17/17   [provider]  fluticasone (FLONASE) 50 MCG/ACT nasal spray Place 1 spray into both nostrils as needed for allergies or rhinitis.    [provider]  furosemide (LASIX) 20 MG tablet Take 1 tablet (20 mg total) by mouth daily. 10/23/17   Daune Perch, NP  gabapentin (NEURONTIN) 300 MG capsule Take 600 mg by mouth at bedtime.  07/17/16   [provider]  hydrOXYzine (ATARAX/VISTARIL) 10 MG tablet Take 10 mg by mouth at bedtime as needed for sleep or anxiety. 08/17/17   [provider]  LORazepam (ATIVAN) 0.5 MG tablet Take 0.5 mg by mouth at bedtime.  10/26/16   [provider]  magnesium oxide (MAG-OX) 400 (241.3 Mg) MG tablet Take 1 tablet (400 mg total) by mouth daily. 05/24/17   Nita Sells, MD  metFORMIN (GLUCOPHAGE) 1000 MG tablet Take 1,000 mg by mouth daily. 12/01/16   [provider]  metoprolol tartrate (LOPRESSOR) 25 MG tablet Take 25 mg by mouth 2 (two) times daily.    [provider]  omeprazole (PRILOSEC) 40 MG capsule Take 40 mg by mouth daily.     [provider]  oxymetazoline (AFRIN) 0.05 % nasal spray Place 1 spray into both nostrils as needed for congestion.    [provider]  polyethylene glycol (MIRALAX / GLYCOLAX) packet Take 17 g by mouth daily as needed for mild constipation. 06/12/17   Lavina Hamman, MD  potassium chloride (K-DUR,KLOR-CON) 10 MEQ tablet Take 2 tablets (20 mEq total) by mouth daily. 06/12/17   Lavina Hamman, MD  Rivaroxaban (XARELTO) 15 MG TABS tablet Take 15 mg by mouth daily with supper. 07/17/16   [provider]    Family History Family History  Problem Relation Age of Onset  . Emphysema Father   . Hypertension Sister   . Lung disease Sister   . Hypertension Brother   . Hypertension Brother   . Stroke Brother   . Other Son        one spleen removed, one with  chronic pain from MVA  . Diabetes Son   . Multiple myeloma Son   . Heart disease Son   . Heart attack Son     Social History Social History   Tobacco Use  . Smoking status: Never Smoker  . Smokeless tobacco: Never Used  Substance Use Topics  . Alcohol use: No  . Drug use: No     Allergies   Clonidine; Captopril; Codeine; and Ceclor [cefaclor]   Review of Systems Review of Systems  Constitutional: Negative for chills and fever.  Respiratory: Negative.  Negative for shortness of breath.   Cardiovascular: Negative.  Negative for chest pain.  Gastrointestinal: Negative.  Negative for abdominal pain and vomiting.  Musculoskeletal: Negative.  Negative for back pain and neck pain.  Skin: Negative.   Neurological: Negative.  Negative for syncope, facial asymmetry, speech difficulty, weakness and headaches.     Physical  Exam Updated Vital Signs BP 128/77   Pulse (!) 58   Temp 98.2 F (36.8 C) (Oral)   Resp 16   SpO2 98%   Physical Exam  Constitutional: She is oriented to person, place, and time. She appears well-developed and well-nourished. No distress.  HENT:  Head: Normocephalic and atraumatic.  Neck: Normal range of motion. Neck supple.  Cardiovascular: Normal rate and regular rhythm.  No murmur heard. Pulmonary/Chest: Effort normal and breath sounds normal. She has no wheezes. She has no rales. She exhibits no tenderness.  Abdominal: Soft. Bowel sounds are normal. There is no tenderness. There is no rebound and no guarding.  Musculoskeletal: Normal range of motion. She exhibits no edema, tenderness or deformity.  Neurological: She is alert and oriented to person, place, and time. She has normal strength. No sensory deficit. She displays a negative Romberg sign. Coordination and gait normal. GCS eye subscore is 4. GCS verbal subscore is 5. GCS motor subscore is 6.  CN's 3-12 intact.   Skin: Skin is warm and dry. No rash noted.  Psychiatric: She has a normal mood  and affect.  Nursing note and vitals reviewed.    ED Treatments / Results  Labs (all labs ordered are listed, but only abnormal results are displayed) Labs Reviewed - No data to display  EKG None  Radiology Ct Head Wo Contrast  Result Date: 11/13/2017 CLINICAL DATA:  Mechanical fall while getting out of bed. Struck posterior head. Patient is on blood thinners. No loss of consciousness. EXAM: CT HEAD WITHOUT CONTRAST CT CERVICAL SPINE WITHOUT CONTRAST TECHNIQUE: Multidetector CT imaging of the head and cervical spine was performed following the standard protocol without intravenous contrast. Multiplanar CT image reconstructions of the cervical spine were also generated. COMPARISON:  CT head 09/10/2017. CT head and cervical spine 09/08/2017 FINDINGS: CT HEAD FINDINGS Brain: Mild cerebral atrophy. Old lacunar infarct in the left basal ganglia. No ventricular dilatation. No mass effect or midline shift. No abnormal extra-axial fluid collections. Gray-white matter junctions are distinct. Basal cisterns are not effaced. No acute intracranial hemorrhage. Vascular: Intracranial arterial vascular calcifications are present. Skull: Calvarium appears intact. Sinuses/Orbits: Paranasal sinuses and mastoid air cells are clear. Other: None. CT CERVICAL SPINE FINDINGS Alignment: Straightening of usual cervical lordosis with slight anterior subluxation at C5-6. This is unchanged since previous study and is likely degenerative. Normal alignment of the facet joints. C1-2 articulation appears intact. Skull base and vertebrae: Skull base appears intact. No vertebral compression deformities. No focal bone lesion or bone destruction. Soft tissues and spinal canal: No prevertebral fluid or swelling. No visible canal hematoma. Disc levels: Degenerative changes throughout the cervical spine with narrowed interspaces and endplate hypertrophic changes. Vertebral degenerative changes are most prominent at C1-2, C3-4, and C6-7  levels. 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 Upper chest: Emphysematous changes and fibrosis in the lung apices. Nodular scarring. Other: None. IMPRESSION: 1. No acute intracranial abnormalities. Mild cerebral atrophy and old lacunar infarcts in the deep white matter. 2. Unchanged alignment of the cervical spine since previous study. Prominent diffuse degenerative changes. No acute displaced fractures identified. 3. Emphysematous changes, fibrosis, and nodular scarring in the lung apices. Electronically Signed   By: Lucienne Capers M.D.   On: 11/13/2017 22:29   Ct Cervical Spine Wo Contrast  Result Date: 11/13/2017 CLINICAL DATA:  Mechanical fall while getting out of bed. Struck posterior head. Patient is on blood thinners. No loss  of consciousness. EXAM: CT HEAD WITHOUT CONTRAST CT CERVICAL SPINE WITHOUT CONTRAST TECHNIQUE: Multidetector CT imaging of the head and cervical spine was performed following the standard protocol without intravenous contrast. Multiplanar CT image reconstructions of the cervical spine were also generated. COMPARISON:  CT head 09/10/2017. CT head and cervical spine 09/08/2017 FINDINGS: CT HEAD FINDINGS Brain: Mild cerebral atrophy. Old lacunar infarct in the left basal ganglia. No ventricular dilatation. No mass effect or midline shift. No abnormal extra-axial fluid collections. Gray-white matter junctions are distinct. Basal cisterns are not effaced. No acute intracranial hemorrhage. Vascular: Intracranial arterial vascular calcifications are present. Skull: Calvarium appears intact. Sinuses/Orbits: Paranasal sinuses and mastoid air cells are clear. Other: None. CT CERVICAL SPINE FINDINGS Alignment: Straightening of usual cervical lordosis with slight anterior subluxation at C5-6. This is unchanged since previous study and is likely degenerative. Normal alignment of the facet joints.  C1-2 articulation appears intact. Skull base and vertebrae: Skull base appears intact. No vertebral compression deformities. No focal bone lesion or bone destruction. Soft tissues and spinal canal: No prevertebral fluid or swelling. No visible canal hematoma. Disc levels: Degenerative changes throughout the cervical spine with narrowed interspaces and endplate hypertrophic changes. Vertebral degenerative changes are most prominent at C1-2, C3-4, and C6-7 levels. 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 Upper chest: Emphysematous changes and fibrosis in the lung apices. Nodular scarring. Other: None. IMPRESSION: 1. No acute intracranial abnormalities. Mild cerebral atrophy and old lacunar infarcts in the deep white matter. 2. Unchanged alignment of the cervical spine since previous study. Prominent diffuse degenerative changes. No acute displaced fractures identified. 3. Emphysematous changes, fibrosis, and nodular scarring in the lung apices. Electronically Signed   By: Lucienne Capers M.D.   On: 11/13/2017 22:29    Procedures Procedures (including critical care time)  Medications Ordered in ED Medications - No data to display   Initial Impression / Assessment and Plan / ED Course  I have reviewed the triage vital signs and the nursing notes.  Pertinent labs & imaging results that were available during my care of the patient were reviewed by me and considered in my medical decision making (see chart for details).     Patient presents after fall at home. No dizziness prior to fall. She feels she lost her balance and fell backward.   She appears well, in NAD. Exam is without abnormal finding. She ambulates and, per son, is at her ambulatory baseline. Usually walks with a walker.   She is examined by Dr. Christy Gentles. CT's of head and neck are negative. She is felt appropriate for discharge. Son is  comfortable taking her home.   Final Clinical Impressions(s) / ED Diagnoses   Final diagnoses:  None   1. Fall  ED Discharge Orders    None       Charlann Lange, Hershal Coria 11/14/17 0721    Ripley Fraise, MD 11/14/17 [phone]

## 2017-11-14 NOTE — ED Provider Notes (Signed)
Patient seen/examined in the Emergency Department in conjunction with Midlevel Provider [place] Patient reports she had a mechanical fall just prior to arrival She reports hitting her head.  No LOC Exam : awake/alert, no distress, resting comfortably Plan: imaging negative Appropriate for d/c home Discussed risk of delayed ICH, and signs/symptoms that should prompt repeat ER visit    Ripley Fraise, MD 11/14/17 [phone]

## 2017-11-19 ENCOUNTER — Ambulatory Visit (INDEPENDENT_AMBULATORY_CARE_PROVIDER_SITE_OTHER): Payer: Medicare Other | Admitting: *Deleted

## 2017-11-19 DIAGNOSIS — I480 Paroxysmal atrial fibrillation: Secondary | ICD-10-CM

## 2017-11-19 NOTE — Progress Notes (Signed)
Carelink Summary Report / Loop Recorder 

## 2017-11-21 ENCOUNTER — Encounter (HOSPITAL_COMMUNITY): Payer: Self-pay | Admitting: Emergency Medicine

## 2017-11-21 ENCOUNTER — Inpatient Hospital Stay (HOSPITAL_COMMUNITY)
Admission: EM | Admit: 2017-11-21 | Discharge: 2017-11-24 | DRG: 917 | Disposition: A | Discharge status: Skilled Nursing Facility | Payer: Medicare Other | Attending: Internal Medicine | Admitting: Internal Medicine

## 2017-11-21 ENCOUNTER — Inpatient Hospital Stay (HOSPITAL_COMMUNITY): Payer: Medicare Other

## 2017-11-21 ENCOUNTER — Emergency Department (HOSPITAL_COMMUNITY): Payer: Medicare Other

## 2017-11-21 ENCOUNTER — Other Ambulatory Visit: Payer: Self-pay

## 2017-11-21 DIAGNOSIS — F5101 Primary insomnia: Secondary | ICD-10-CM | POA: Diagnosis present

## 2017-11-21 DIAGNOSIS — Z66 Do not resuscitate: Secondary | ICD-10-CM | POA: Diagnosis present

## 2017-11-21 DIAGNOSIS — G8929 Other chronic pain: Secondary | ICD-10-CM | POA: Diagnosis present

## 2017-11-21 DIAGNOSIS — E7800 Pure hypercholesterolemia, unspecified: Secondary | ICD-10-CM | POA: Diagnosis present

## 2017-11-21 DIAGNOSIS — Z881 Allergy status to other antibiotic agents status: Secondary | ICD-10-CM

## 2017-11-21 DIAGNOSIS — E785 Hyperlipidemia, unspecified: Secondary | ICD-10-CM | POA: Diagnosis present

## 2017-11-21 DIAGNOSIS — F411 Generalized anxiety disorder: Secondary | ICD-10-CM | POA: Diagnosis present

## 2017-11-21 DIAGNOSIS — R400 Somnolence: Secondary | ICD-10-CM | POA: Diagnosis present

## 2017-11-21 DIAGNOSIS — Z681 Body mass index (BMI) 19 or less, adult: Secondary | ICD-10-CM | POA: Diagnosis not present

## 2017-11-21 DIAGNOSIS — K219 Gastro-esophageal reflux disease without esophagitis: Secondary | ICD-10-CM | POA: Diagnosis present

## 2017-11-21 DIAGNOSIS — F419 Anxiety disorder, unspecified: Secondary | ICD-10-CM | POA: Diagnosis present

## 2017-11-21 DIAGNOSIS — Z79899 Other long term (current) drug therapy: Secondary | ICD-10-CM

## 2017-11-21 DIAGNOSIS — Z950 Presence of cardiac pacemaker: Secondary | ICD-10-CM

## 2017-11-21 DIAGNOSIS — F329 Major depressive disorder, single episode, unspecified: Secondary | ICD-10-CM | POA: Diagnosis present

## 2017-11-21 DIAGNOSIS — I480 Paroxysmal atrial fibrillation: Secondary | ICD-10-CM | POA: Diagnosis present

## 2017-11-21 DIAGNOSIS — Z8249 Family history of ischemic heart disease and other diseases of the circulatory system: Secondary | ICD-10-CM | POA: Diagnosis not present

## 2017-11-21 DIAGNOSIS — Z7984 Long term (current) use of oral hypoglycemic drugs: Secondary | ICD-10-CM | POA: Diagnosis not present

## 2017-11-21 DIAGNOSIS — I4581 Long QT syndrome: Secondary | ICD-10-CM | POA: Diagnosis present

## 2017-11-21 DIAGNOSIS — R413 Other amnesia: Secondary | ICD-10-CM | POA: Diagnosis present

## 2017-11-21 DIAGNOSIS — M549 Dorsalgia, unspecified: Secondary | ICD-10-CM

## 2017-11-21 DIAGNOSIS — Z7901 Long term (current) use of anticoagulants: Secondary | ICD-10-CM | POA: Diagnosis not present

## 2017-11-21 DIAGNOSIS — T391X1A Poisoning by 4-Aminophenol derivatives, accidental (unintentional), initial encounter: Principal | ICD-10-CM | POA: Diagnosis present

## 2017-11-21 DIAGNOSIS — I10 Essential (primary) hypertension: Secondary | ICD-10-CM | POA: Diagnosis present

## 2017-11-21 DIAGNOSIS — J449 Chronic obstructive pulmonary disease, unspecified: Secondary | ICD-10-CM | POA: Diagnosis present

## 2017-11-21 DIAGNOSIS — I110 Hypertensive heart disease with heart failure: Secondary | ICD-10-CM | POA: Diagnosis present

## 2017-11-21 DIAGNOSIS — E43 Unspecified severe protein-calorie malnutrition: Secondary | ICD-10-CM | POA: Diagnosis present

## 2017-11-21 DIAGNOSIS — I5022 Chronic systolic (congestive) heart failure: Secondary | ICD-10-CM | POA: Diagnosis present

## 2017-11-21 DIAGNOSIS — Z888 Allergy status to other drugs, medicaments and biological substances status: Secondary | ICD-10-CM

## 2017-11-21 DIAGNOSIS — Z885 Allergy status to narcotic agent status: Secondary | ICD-10-CM

## 2017-11-21 DIAGNOSIS — R9431 Abnormal electrocardiogram [ECG] [EKG]: Secondary | ICD-10-CM | POA: Diagnosis present

## 2017-11-21 DIAGNOSIS — Z952 Presence of prosthetic heart valve: Secondary | ICD-10-CM

## 2017-11-21 DIAGNOSIS — Z953 Presence of xenogenic heart valve: Secondary | ICD-10-CM

## 2017-11-21 DIAGNOSIS — E119 Type 2 diabetes mellitus without complications: Secondary | ICD-10-CM | POA: Diagnosis present

## 2017-11-21 LAB — PROTIME-INR
INR: 1.18
Prothrombin Time: 14.9 seconds (ref 11.4–15.2)

## 2017-11-21 LAB — URINALYSIS, ROUTINE W REFLEX MICROSCOPIC
Bilirubin Urine: NEGATIVE
GLUCOSE, UA: 50 mg/dL — AB
Hgb urine dipstick: NEGATIVE
Ketones, ur: NEGATIVE mg/dL
LEUKOCYTES UA: NEGATIVE
Nitrite: NEGATIVE
PH: 7 (ref 5.0–8.0)
PROTEIN: NEGATIVE mg/dL
Specific Gravity, Urine: 1.015 (ref 1.005–1.030)

## 2017-11-21 LAB — CBC
HCT: 35.5 % — ABNORMAL LOW (ref 36.0–46.0)
HEMOGLOBIN: 10.6 g/dL — AB (ref 12.0–15.0)
MCH: 26.2 pg (ref 26.0–34.0)
MCHC: 29.9 g/dL — AB (ref 30.0–36.0)
MCV: 87.9 fL (ref 78.0–100.0)
Platelets: 388 10*3/uL (ref 150–400)
RBC: 4.04 MIL/uL (ref 3.87–5.11)
RDW: 16.3 % — AB (ref 11.5–15.5)
WBC: 8.2 10*3/uL (ref 4.0–10.5)

## 2017-11-21 LAB — BASIC METABOLIC PANEL
Anion gap: 14 (ref 5–15)
BUN: 17 mg/dL (ref 8–23)
CALCIUM: 9.8 mg/dL (ref 8.9–10.3)
CO2: 24 mmol/L (ref 22–32)
CREATININE: 0.78 mg/dL (ref 0.44–1.00)
Chloride: 96 mmol/L — ABNORMAL LOW (ref 98–111)
GFR calc non Af Amer: >60 mL/min (ref >60)
Glucose, Bld: 148 mg/dL — ABNORMAL HIGH (ref 70–99)
Potassium: 3.8 mmol/L (ref 3.5–5.1)
SODIUM: 134 mmol/L — AB (ref 135–145)

## 2017-11-21 LAB — ACETAMINOPHEN LEVEL: ACETAMINOPHEN (TYLENOL), SERUM: 103 ug/mL — AB (ref 10–30)

## 2017-11-21 LAB — HEPATIC FUNCTION PANEL
ALT: 16 U/L (ref 0–44)
AST: 26 U/L (ref 15–41)
Albumin: 4.1 g/dL (ref 3.5–5.0)
Alkaline Phosphatase: 89 U/L (ref 38–126)
Bilirubin, Direct: <0.1 mg/dL (ref 0.0–0.2)
TOTAL PROTEIN: 8.1 g/dL (ref 6.5–8.1)
Total Bilirubin: 0.5 mg/dL (ref 0.3–1.2)

## 2017-11-21 LAB — RAPID URINE DRUG SCREEN, HOSP PERFORMED
Amphetamines: NOT DETECTED
Barbiturates: NOT DETECTED
Benzodiazepines: NOT DETECTED
COCAINE: NOT DETECTED
OPIATES: NOT DETECTED
Tetrahydrocannabinol: NOT DETECTED

## 2017-11-21 LAB — GLUCOSE, CAPILLARY
Glucose-Capillary: 142 mg/dL — ABNORMAL HIGH (ref 70–99)
Glucose-Capillary: 110 mg/dL — ABNORMAL HIGH (ref 70–99)

## 2017-11-21 LAB — ETHANOL

## 2017-11-21 LAB — SALICYLATE LEVEL: Salicylate Lvl: <7 mg/dL (ref 2.8–30.0)

## 2017-11-21 LAB — CBG MONITORING, ED: Glucose-Capillary: 124 mg/dL — ABNORMAL HIGH (ref 70–99)

## 2017-11-21 LAB — AMMONIA

## 2017-11-21 MED ORDER — PANTOPRAZOLE SODIUM 40 MG PO TBEC
40.0000 mg | DELAYED_RELEASE_TABLET | Freq: Every day | ORAL | Status: DC
  Administered 2017-11-21 – 2017-11-24 (×4): 40 mg via ORAL
  Filled 2017-11-21 (×4): qty 1

## 2017-11-21 MED ORDER — ALBUTEROL SULFATE (2.5 MG/3ML) 0.083% IN NEBU: 3.0000 mL | INHALATION_SOLUTION | Freq: Four times a day (QID) | RESPIRATORY_TRACT | Status: DC | PRN

## 2017-11-21 MED ORDER — INSULIN ASPART 100 UNIT/ML ~~LOC~~ SOLN
0.0000 [IU] | Freq: Three times a day (TID) | SUBCUTANEOUS | Status: DC
  Administered 2017-11-21: 1 [IU] via SUBCUTANEOUS
  Administered 2017-11-22: 2 [IU] via SUBCUTANEOUS
  Administered 2017-11-23: 1 [IU] via SUBCUTANEOUS

## 2017-11-21 MED ORDER — LORAZEPAM 0.5 MG PO TABS: 0.5000 mg | ORAL_TABLET | Freq: Three times a day (TID) | ORAL | Status: DC | PRN

## 2017-11-21 MED ORDER — ACETYLCYSTEINE LOAD VIA INFUSION
150.0000 mg/kg | Freq: Once | INTRAVENOUS | Status: AC
  Administered 2017-11-21: 7545 mg via INTRAVENOUS
  Filled 2017-11-21: qty 189

## 2017-11-21 MED ORDER — AMLODIPINE BESYLATE 5 MG PO TABS
2.5000 mg | ORAL_TABLET | Freq: Every day | ORAL | Status: DC
  Administered 2017-11-21 – 2017-11-24 (×4): 2.5 mg via ORAL
  Filled 2017-11-21 (×4): qty 1

## 2017-11-21 MED ORDER — MAGNESIUM OXIDE 400 (241.3 MG) MG PO TABS
400.0000 mg | ORAL_TABLET | Freq: Every day | ORAL | Status: DC
  Administered 2017-11-21 – 2017-11-24 (×4): 400 mg via ORAL
  Filled 2017-11-21 (×4): qty 1

## 2017-11-21 MED ORDER — DEXTROSE 5 % IV SOLN
15.0000 mg/kg/h | INTRAVENOUS | Status: DC
  Administered 2017-11-21: 15 mg/kg/h via INTRAVENOUS
  Filled 2017-11-21: qty 150

## 2017-11-21 MED ORDER — FLUOXETINE HCL 10 MG PO CAPS: 10.0000 mg | ORAL_CAPSULE | Freq: Every day | ORAL | Status: DC

## 2017-11-21 MED ORDER — ENSURE ENLIVE PO LIQD
237.0000 mL | ORAL | Status: DC
  Administered 2017-11-22 – 2017-11-24 (×3): 237 mL via ORAL

## 2017-11-21 MED ORDER — AMIODARONE HCL 200 MG PO TABS: 100.0000 mg | ORAL_TABLET | Freq: Every day | ORAL | Status: DC

## 2017-11-21 MED ORDER — POLYETHYLENE GLYCOL 3350 17 G PO PACK
17.0000 g | PACK | Freq: Every day | ORAL | Status: DC | PRN
  Administered 2017-11-22 – 2017-11-24 (×2): 17 g via ORAL
  Filled 2017-11-21 (×2): qty 1

## 2017-11-21 MED ORDER — METOPROLOL TARTRATE 25 MG PO TABS
25.0000 mg | ORAL_TABLET | Freq: Two times a day (BID) | ORAL | Status: DC
  Administered 2017-11-21 – 2017-11-24 (×7): 25 mg via ORAL
  Filled 2017-11-21 (×8): qty 1

## 2017-11-21 MED ORDER — MIRTAZAPINE 15 MG PO TABS
7.5000 mg | ORAL_TABLET | Freq: Every day | ORAL | Status: DC
  Administered 2017-11-21 – 2017-11-23 (×3): 7.5 mg via ORAL
  Filled 2017-11-21 (×4): qty 1

## 2017-11-21 MED ORDER — TRAMADOL HCL 50 MG PO TABS
50.0000 mg | ORAL_TABLET | Freq: Four times a day (QID) | ORAL | Status: DC | PRN
  Administered 2017-11-21 – 2017-11-24 (×4): 50 mg via ORAL
  Filled 2017-11-21 (×4): qty 1

## 2017-11-21 MED ORDER — HYDROXYZINE HCL 10 MG PO TABS
10.0000 mg | ORAL_TABLET | Freq: Every evening | ORAL | Status: DC | PRN
  Filled 2017-11-21: qty 1

## 2017-11-21 MED ORDER — BUPROPION HCL ER (XL) 150 MG PO TB24
150.0000 mg | ORAL_TABLET | Freq: Every day | ORAL | Status: DC
  Administered 2017-11-21 – 2017-11-24 (×4): 150 mg via ORAL
  Filled 2017-11-21 (×5): qty 1

## 2017-11-21 MED ORDER — RIVAROXABAN 15 MG PO TABS
15.0000 mg | ORAL_TABLET | Freq: Every day | ORAL | Status: DC
  Administered 2017-11-21 – 2017-11-24 (×4): 15 mg via ORAL
  Filled 2017-11-21 (×4): qty 1

## 2017-11-21 MED ORDER — FUROSEMIDE 20 MG PO TABS
20.0000 mg | ORAL_TABLET | Freq: Every day | ORAL | Status: DC
  Administered 2017-11-21 – 2017-11-24 (×4): 20 mg via ORAL
  Filled 2017-11-21 (×4): qty 1

## 2017-11-21 MED ORDER — GABAPENTIN 300 MG PO CAPS
600.0000 mg | ORAL_CAPSULE | Freq: Every day | ORAL | Status: DC
  Administered 2017-11-21 – 2017-11-23 (×3): 600 mg via ORAL
  Filled 2017-11-21 (×3): qty 2

## 2017-11-21 MED ORDER — INSULIN ASPART 100 UNIT/ML ~~LOC~~ SOLN: 0.0000 [IU] | Freq: Every day | SUBCUTANEOUS | Status: DC

## 2017-11-21 MED ORDER — SODIUM CHLORIDE 0.9 % IV SOLN: INTRAVENOUS | Status: DC

## 2017-11-21 MED ORDER — FLUTICASONE PROPIONATE 50 MCG/ACT NA SUSP
1.0000 | NASAL | Status: DC | PRN
  Filled 2017-11-21: qty 16

## 2017-11-21 MED ORDER — BISMUTH SUBSALICYLATE 262 MG/15ML PO SUSP
30.0000 mL | Freq: Four times a day (QID) | ORAL | Status: DC | PRN
  Filled 2017-11-21: qty 236

## 2017-11-21 MED ORDER — BISACODYL 5 MG PO TBEC
5.0000 mg | DELAYED_RELEASE_TABLET | ORAL | Status: DC | PRN
  Administered 2017-11-23 – 2017-11-24 (×2): 5 mg via ORAL
  Filled 2017-11-21 (×2): qty 1

## 2017-11-21 MED ORDER — METFORMIN HCL 500 MG PO TABS
1000.0000 mg | ORAL_TABLET | Freq: Every day | ORAL | Status: DC
  Administered 2017-11-22 – 2017-11-24 (×3): 1000 mg via ORAL
  Filled 2017-11-21 (×3): qty 2

## 2017-11-21 MED ORDER — POTASSIUM CHLORIDE CRYS ER 20 MEQ PO TBCR: 20.0000 meq | EXTENDED_RELEASE_TABLET | Freq: Every day | ORAL | Status: DC

## 2017-11-21 NOTE — ED Provider Notes (Signed)
[facility] EMERGENCY DEPARTMENT Provider Note   CSN: [id number] Arrival date & time: 11/21/17  2836     History   Chief Complaint Chief Complaint  Patient presents with  . Fall    HPI Angela Hines is a 84 y.o. female.  HPI  Angela Hines is an 84yo female with a history of paroxysmal atrial fibrillation (on Xarelto), COPD, CHF, hypertension, hyperlipidemia, GAD, prolonged QT who presents to the emergency department via EMS for evaluation of drowsiness and presumed overdose on Ativan.  History is provided by her son at bedside who lives with her in her nursing facility.  He states that she has had slowly progressive mental decline for the past 6 months.  Reports that she has had frequent falls, most recently a week and a half ago.  Per chart review she had CT head and CT cervical spine studies at the time which were negative for acute or emergent process. He states that she takes 2 Ativan daily which is provided by her nursing facility and also she is able to give herself 2 Ativan daily as needed in a pill box on her nightstand.  His brother fills her pillbox weekly on Sundays.  He states that this morning when he checked on his mother she was laying on the ground and was difficult to arouse.  He looked at her nightstand and saw that all of the Ativan was gone for the rest of the week.  She has no prior history of doing this. He reports she is at her mental baseline, just drowsy. Patient initially sleeping at bedside. When woken she states that she doesn't remember taking extra ativan or any other pills. She denies any complaints. She denies SI/HI or depressed mood. No prior history of suicide attempts. She then goes on to talk about a vacation in [place] which her son states she never took.  Past Medical History:  Diagnosis Date  . Atrial fibrillation ([place])   . CHF (congestive heart failure) ([place])   . COPD (chronic obstructive pulmonary disease) ([place])   . Diabetes  mellitus without complication ([place])   . High cholesterol   . Hypertension     Patient Active Problem List   Diagnosis Date Noted  . Nausea & vomiting 09/10/2017  . Chest pain 09/10/2017  . High anion gap metabolic acidosis 62/94/7654  . Chronic systolic CHF (congestive heart failure) ([place]) 09/10/2017  . Protein-calorie malnutrition, severe 06/11/2017  . Prolonged QT interval   . Diabetes mellitus ([place]) 07/17/2016  . Gastroesophageal reflux disease without esophagitis 07/17/2016  . Generalized anxiety disorder 07/17/2016  . Hypercholesterolemia 07/17/2016  . Hypertension 07/17/2016  . Status post placement of implantable loop recorder 07/17/2016  . AF (paroxysmal atrial fibrillation) ([place]) 07/17/2016  . Primary insomnia 07/17/2016  . S/P AVR (aortic valve replacement) 07/17/2016  . History of syncope 08/07/2014    Past Surgical History:  Procedure Laterality Date  . CARDIAC PACEMAKER PLACEMENT  07/30/2014  . 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 CARDIOVERSION N/A 06/15/2017   Procedure: CARDIOVERSION;  Surgeon: Lelon Perla, MD;  Location: [facility] ENDOSCOPY;  Service: Cardiovascular;  Laterality: N/A;  . CHOLECYSTECTOMY    . EXCISIONAL HEMORRHOIDECTOMY    . LOOP RECORDER INSERTION N/A 08/22/2017   Procedure: LOOP RECORDER INSERTION;  Surgeon: [name], MD;  Location: [facility];  Service: Cardiovascular;  Laterality: N/A;  . LOOP RECORDER REMOVAL N/A 08/22/2017   Procedure: LOOP RECORDER REMOVAL;  Surgeon: [name], MD;  Location: [facility] CV LAB;  Service: Cardiovascular;  Laterality: N/A;     OB History   None      Home Medications    Prior to Admission medications   Medication Sig Start Date End Date Taking? Authorizing Provider  albuterol (PROVENTIL HFA;VENTOLIN HFA) 108 (90 Base) MCG/ACT inhaler Inhale 2 puffs into the lungs every 6 (six) hours as needed for shortness of breath.    [provider]  amiodarone (PACERONE)  200 MG tablet Take 0.5 tablets (100 mg total) by mouth daily. 09/26/17   Allred, Jeneen Rinks, MD  amLODipine (NORVASC) 2.5 MG tablet Take 1 tablet (2.5 mg total) by mouth daily. 10/23/17 01/21/18  Daune Perch, NP  bismuth subsalicylate (PEPTO BISMOL) 262 MG/15ML suspension Take 30 mLs by mouth every 6 (six) hours as needed for indigestion.    [provider]  buPROPion (WELLBUTRIN XL) 150 MG 24 hr tablet Take 150 mg by mouth daily. 06/20/17   [provider]  diphenhydramine-acetaminophen (TYLENOL PM EXTRA STRENGTH) 25-500 MG TABS tablet Take 1 tablet by mouth at bedtime as needed (sleep).    [provider]  feeding supplement, ENSURE ENLIVE, (ENSURE ENLIVE) LIQD Take 237 mLs by mouth 3 (three) times daily between meals. Patient taking differently: Take 237 mLs by mouth daily.  06/12/17   Lavina Hamman, MD  FLUoxetine (PROZAC) 10 MG capsule Take 10 mg by mouth daily. 08/17/17   [provider]  fluticasone (FLONASE) 50 MCG/ACT nasal spray Place 1 spray into both nostrils as needed for allergies or rhinitis.    [provider]  furosemide (LASIX) 20 MG tablet Take 1 tablet (20 mg total) by mouth daily. 10/23/17   Daune Perch, NP  gabapentin (NEURONTIN) 300 MG capsule Take 600 mg by mouth at bedtime.  07/17/16   [provider]  hydrOXYzine (ATARAX/VISTARIL) 10 MG tablet Take 10 mg by mouth at bedtime as needed for sleep or anxiety. 08/17/17   [provider]  LORazepam (ATIVAN) 0.5 MG tablet Take 1 tablet (0.5 mg total) by mouth 3 (three) times daily as needed for anxiety. 11/14/17   Charlann Lange, PA-C  magnesium oxide (MAG-OX) 400 (241.3 Mg) MG tablet Take 1 tablet (400 mg total) by mouth daily. 05/24/17   Nita Sells, MD  metFORMIN (GLUCOPHAGE) 1000 MG tablet Take 1,000 mg by mouth daily. 12/01/16   [provider]  metoprolol tartrate (LOPRESSOR) 25 MG tablet Take 25 mg by mouth 2 (two) times daily.    [provider]    omeprazole (PRILOSEC) 40 MG capsule Take 40 mg by mouth daily.     [provider]  oxymetazoline (AFRIN) 0.05 % nasal spray Place 1 spray into both nostrils as needed for congestion.    [provider]  polyethylene glycol (MIRALAX / GLYCOLAX) packet Take 17 g by mouth daily as needed for mild constipation. 06/12/17   Lavina Hamman, MD  potassium chloride (K-DUR,KLOR-CON) 10 MEQ tablet Take 2 tablets (20 mEq total) by mouth daily. 06/12/17   Lavina Hamman, MD  Rivaroxaban (XARELTO) 15 MG TABS tablet Take 15 mg by mouth daily with supper. 07/17/16   [provider]    Family History Family History  Problem Relation Age of Onset  . Emphysema Father   . Hypertension Sister   . Lung disease Sister   . Hypertension Brother   . Hypertension Brother   . Stroke Brother   . Other Son  one spleen removed, one with chronic pain from MVA  . Diabetes Son   . Multiple myeloma Son   . Heart disease Son   . Heart attack Son     Social History Social History   Tobacco Use  . Smoking status: Never Smoker  . Smokeless tobacco: Never Used  Substance Use Topics  . Alcohol use: No  . Drug use: No     Allergies   Clonidine; Captopril; Codeine; and Ceclor [cefaclor]   Review of Systems Review of Systems  Unable to perform ROS: Dementia     Physical Exam Updated Vital Signs BP (!) 145/73   Pulse 64   Temp 98.1 F (36.7 C) (Oral)   Resp (!) 21   SpO2 99%   Physical Exam  Constitutional: She appears well-developed and well-nourished. No distress.  Sleeping at bedside, wakened with verbal stimuli. No apparent distress.   HENT:  Head: Normocephalic and atraumatic.  Mouth/Throat: Oropharynx is clear and moist.  No signs of head trauma.  No laceration or ecchymosis noted over the face or scalp.  Eyes: Conjunctivae are normal. Right eye exhibits no discharge. Left eye exhibits no discharge.  Pupils 107m bilaterally. Reactive slowly to light.   Neck:  Normal range of motion. Neck supple.  No midline cervical spine tenderness.  Cardiovascular: Normal rate, regular rhythm and intact distal pulses.  Murmur (systolic 4/6 blowing murmur) heard. Pulmonary/Chest: Effort normal and breath sounds normal. No stridor. No respiratory distress. She has no wheezes. She has no rales.  Abdominal: Soft. Bowel sounds are normal. There is no tenderness.  Neurological: She is alert. Coordination normal.  Oriented to person, place.  Knows that it is July, but disoriented to year.  No apparent aphasia or facial droop.  Moving all extremities.  Skin: Skin is warm and dry. Capillary refill takes less than 2 seconds. She is not diaphoretic.  Psychiatric: She has a normal mood and affect. Her behavior is normal.  Denies SI/HI.  Nursing note and vitals reviewed.    ED Treatments / Results  Labs (all labs ordered are listed, but only abnormal results are displayed) Labs Reviewed  ACETAMINOPHEN LEVEL - Abnormal; Notable for the following components:      Result Value   Acetaminophen (Tylenol), Serum 103 (*)    All other components within normal limits  CBC - Abnormal; Notable for the following components:   Hemoglobin 10.6 (*)    HCT 35.5 (*)    MCHC 29.9 (*)    RDW 16.3 (*)    All other components within normal limits  BASIC METABOLIC PANEL - Abnormal; Notable for the following components:   Sodium 134 (*)    Chloride 96 (*)    Glucose, Bld 148 (*)    All other components within normal limits  URINALYSIS, ROUTINE W REFLEX MICROSCOPIC - Abnormal; Notable for the following components:   Color, Urine STRAW (*)    Glucose, UA 50 (*)    All other components within normal limits  AMMONIA - Abnormal; Notable for the following components:   Ammonia <9 (*)    All other components within normal limits  GLUCOSE, CAPILLARY - Abnormal; Notable for the following components:   Glucose-Capillary 142 (*)    All other components within normal limits  CBG  MONITORING, ED - Abnormal; Notable for the following components:   Glucose-Capillary 124 (*)    All other components within normal limits  SALICYLATE LEVEL  ETHANOL  RAPID URINE DRUG SCREEN, HOSP PERFORMED  PROTIME-INR  HEPATIC FUNCTION PANEL    EKG EKG Interpretation  Date/Time:  Wednesday November 21 2017 08:32:22 EDT Ventricular Rate:  63 PR Interval:    QRS Duration: 112 QT Interval:  560 QTC Calculation: 574 R Axis:   -47 Text Interpretation:  Sinus rhythm Short PR interval LVH with IVCD, LAD and secondary repol abnrm Prolonged QT interval When comapred to prior, slower rate and longer QTc.  No STEMI Confirmed by Antony Blackbird [phone]) on 11/21/2017 9:38:28 AM   Radiology Ct Head Wo Contrast  Result Date: 11/21/2017 CLINICAL DATA:  84 year old female with altered mental status. Possible overdose. EXAM: CT HEAD WITHOUT CONTRAST TECHNIQUE: Contiguous axial images were obtained from the base of the skull through the vertex without intravenous contrast. COMPARISON:  11/13/2017 and prior CTs FINDINGS: Brain: No evidence of acute infarction, hemorrhage, hydrocephalus, extra-axial collection or mass lesion/mass effect. Mild atrophy and moderate chronic small-vessel white matter ischemic changes are again noted. Vascular: Carotid atherosclerotic calcifications again noted. Skull: Normal. Negative for fracture or focal lesion. Sinuses/Orbits: No acute abnormality Other: None IMPRESSION: 1. No evidence of acute intracranial abnormality 2. Atrophy and chronic small-vessel white matter ischemic changes. Electronically Signed   By: Margarette Canada M.D.   On: 11/21/2017 10:52    Procedures Procedures (including critical care time)  Medications Ordered in ED Medications - No data to display   Initial Impression / Assessment and Plan / ED Course  I have reviewed the triage vital signs and the nursing notes.  Pertinent labs & imaging results that were available during my care of the patient were  reviewed by me and considered in my medical decision making (see chart for details).     Presents to the emergency department for presumed Ativan overdose, found on the ground at 6AM and very slow to wake.  She has a history of memory loss, her brother at at bedside states that other than being drowsy her mental status is baseline.  On exam patient is drowsy but arousable to verbal stimuli.  She denies depression or suicide attempt.  She cannot remember taking Ativan last night.  She seems somewhat confused talking about vacation in [place] and the birth of her husband.  No focal neurological deficits.  No evidence of head trauma.    CT head without acute intracranial abnormality.  Rapid urine drug screen negative, including benzodiazepines.  Acetaminophen level elevated at 103, unclear what time patient took Tylenol.  Family at bedside states that she takes Tylenol PM's nightly and sometimes takes 4-6 of them to help with sleep.  They are concerned given the benzodiazepine level was negative, that there has been followed play at her nursing facility.  There also asking for more information about placement in a rehab facility where she will have more supervision.    I discussed this patient with poison control who recommends treatment with IV N- acetylcysteine.  This medication was started in the ED. Liver enzymes within normal limits.  Ammonia negative and INR WNL.  Discussed this patient with Dr. Sherry Ruffing who also saw the patient and agrees with admission and treatment plan.  Discussed this patient with hospitalist Dr. Steffanie Dunn who will admit.   Final Clinical Impressions(s) / ED Diagnoses   Final diagnoses:  None    ED Discharge Orders    None       Bernarda Caffey 11/21/17 1806    Tegeler, Gwenyth Allegra, MD 11/22/17 [phone]

## 2017-11-21 NOTE — ED Notes (Addendum)
Spoke with Angela Hines from poison control and would like [place] to continue acetadote for 22 hours after the start of the bolus ;  Will re draw acetaminophen, liver function tests  and INR at 1243 pm tomorrow

## 2017-11-21 NOTE — ED Notes (Signed)
Pt placed on bedpan

## 2017-11-21 NOTE — Plan of Care (Signed)
Discussed plan of care for the evening with the patient.  Emphasized the importance of using the call button if assistance is needed.   Good teach back displayed.

## 2017-11-21 NOTE — ED Triage Notes (Addendum)
Pt was found by son beside bed. Ativan 8-10 ativans missing from pill bottle. Lives in [place] at [place]. She says she remembers taking 4 but no more. Hx of this happening before. A&Ox4. No complaints of anything. States she just feels tired. Last thing she remembers is going to bed around 2330. Small hematoma on R arm. On thinners. Is supposed to use a cane but doesn't and ambulates well at baseline.

## 2017-11-21 NOTE — ED Notes (Signed)
Dr. tegeler aware of tylenol level ; no orders received

## 2017-11-21 NOTE — ED Notes (Signed)
Pt going to CT scan at this time.   

## 2017-11-21 NOTE — Plan of Care (Signed)
I spoke with Fabian Sharp, cardiology PA about stopping pt's amiodarone given her worsening QT prolongation. As pt is only on 100 mg daily, she recommended that we stop it and that she would put a message in to clinic to have pt seen in the next 2-3 weeks. Amiodarone d/c'd off her list.

## 2017-11-21 NOTE — H&P (Addendum)
History and Physical    Angela Hines QIH:[id number] DOB: July 26, 1932 DOA: 11/21/2017  PCP: Angela Pillar, MD Consultants:  Poison control Patient coming from: [facility], lives with son  Chief Complaint: Acetaminophen toxicity   HPI: Angela Hines is a 84 y.o. female with medical history significant for DM, GERD, HLD, HTN, PAF, s/p AVR on chronic anticoagulation, GAD, who was found beside her bed at her independent living facility by her son this morning around 0600. Per report she has been suffering from memory loss for the past 2-3 months. Staff at facility gives her meds but she has access to tylenol PM and ativan at nighttime prn for sleep. Son found her 0600 lying beside her bed. All of the ativan was gone for the week (filled every Sunday with 2 tablets daily, so presumably 8 were taken). Son says this has been going on almost every week for several months. She has been on lorazepam for 10 years since her husband died for insomnia and anxiety. Apparently the insomnia is refractory to several interventions and pt tends to "take whatever she can get her hands on" to sleep (ie anything with drowsiness as a side effect, such as benadryl, dramamine.) She has not been taking the atarax as it was not effective. Her back has been hurting (lower) since her fall about 10 days ago. During that ED visit she had a head CT that was OK but had no spinal imaging. She has been taking the tylenol due to that back pain. APAP level was 103. Unsure how much she took or what time she took it.   ED Course:  She is easily arousable to voice. She states she has not been depressed, has not been trying to hurt herself. Not suicidal ideation. Family concerned that no benzos detected in her urine. Facility gives her 2 ativan daily scheduled. LFTs, INR OK. Ammonia level <9. Poison control recommended treating with IV NAC for 23 hours per protocol.  Review of Systems: As per HPI; otherwise review of systems  reviewed and negative. No abdominal pain, no N/V/D. No yellowing of skin or eyes.  Ambulatory Status:  Ambulates with assistance  Past Medical History:  Diagnosis Date  . Atrial fibrillation ([place])   . CHF (congestive heart failure) ([place])   . COPD (chronic obstructive pulmonary disease) ([place])   . Diabetes mellitus without complication ([place])   . High cholesterol   . Hypertension     Past Surgical History:  Procedure Laterality Date  . CARDIAC PACEMAKER PLACEMENT  07/30/2014  . 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 CARDIOVERSION N/A 06/15/2017   Procedure: CARDIOVERSION;  Surgeon: Lelon Perla, MD;  Location[facility] ENDOSCOPY;  Service: Cardiovascular;  Laterality: N/A;  . CHOLECYSTECTOMY    . EXCISIONAL HEMORRHOIDECTOMY    . LOOP RECORDER INSERTION N/A 08/22/2017   Procedure: LOOP RECORDER INSERTION;  Surgeon: [name], MD;  Location: [facility] CV LAB;  Service: Cardiovascular;  Laterality: N/A;  . LOOP RECORDER REMOVAL N/A 08/22/2017   Procedure: LOOP RECORDER REMOVAL;  Surgeon: [name], MD;  Location: [facility];  Service: Cardiovascular;  Laterality: N/A;    Social History   Socioeconomic History  . Marital status: Single    Spouse name: Not on file  . Number of children: Not on file  . Years of education: Not on file  . Highest education level: Not on file  Occupational History  . Not on file  Social Needs  .  Financial resource strain: Not on file  . Food insecurity:    Worry: Not on file    Inability: Not on file  . Transportation needs:    Medical: Not on file    Non-medical: Not on file  Tobacco Use  . Smoking status: Never Smoker  . Smokeless tobacco: Never Used  Substance and Sexual Activity  . Alcohol use: No  . Drug use: No  . Sexual activity: Never  Lifestyle  . Physical activity:    Days per week: Not on file    Minutes per session: Not on file  . Stress: Not on file  Relationships  . Social connections:     Talks on phone: Not on file    Gets together: Not on file    Attends religious service: Not on file    Active member of club or organization: Not on file    Attends meetings of clubs or organizations: Not on file    Relationship status: Not on file  . Intimate partner violence:    Fear of current or ex partner: Not on file    Emotionally abused: Not on file    Physically abused: Not on file    Forced sexual activity: Not on file  Other Topics Concern  . Not on file  Social History Narrative  . Not on file    Allergies  Allergen Reactions  . Clonidine Swelling and Other (See Comments)    "head swelling"  . Captopril Swelling  . Codeine Nausea And Vomiting and Other (See Comments)    GI Intolerance   . Ceclor [Cefaclor] Rash    Family History  Problem Relation Age of Onset  . Emphysema Father   . Hypertension Sister   . Lung disease Sister   . Hypertension Brother   . Hypertension Brother   . Stroke Brother   . Other Son        one spleen removed, one with chronic pain from MVA  . Diabetes Son   . Multiple myeloma Son   . Heart disease Son   . Heart attack Son     Prior to Admission medications   Medication Sig Start Date End Date Taking? Authorizing Provider  albuterol (PROVENTIL HFA;VENTOLIN HFA) 108 (90 Base) MCG/ACT inhaler Inhale 2 puffs into the lungs every 6 (six) hours as needed for shortness of breath.   Yes [provider]  amiodarone (PACERONE) 200 MG tablet Take 0.5 tablets (100 mg total) by mouth daily. 09/26/17  Yes Allred, Jeneen Rinks, MD  amLODipine (NORVASC) 2.5 MG tablet Take 1 tablet (2.5 mg total) by mouth daily. 10/23/17 01/21/18 Yes Daune Perch, NP  bisacodyl (DULCOLAX) 5 MG EC tablet Take 5 mg by mouth as needed for moderate constipation.   Yes [provider]  bismuth subsalicylate (PEPTO BISMOL) 262 MG/15ML suspension Take 30 mLs by mouth every 6 (six) hours as needed for indigestion.   Yes [provider]  buPROPion  (WELLBUTRIN XL) 150 MG 24 hr tablet Take 150 mg by mouth daily. 06/20/17  Yes [provider]  diphenhydramine-acetaminophen (TYLENOL PM EXTRA STRENGTH) 25-500 MG TABS tablet Take 1 tablet by mouth at bedtime as needed (sleep).   Yes [provider]  feeding supplement, ENSURE ENLIVE, (ENSURE ENLIVE) LIQD Take 237 mLs by mouth 3 (three) times daily between meals. Patient taking differently: Take 237 mLs by mouth daily.  06/12/17  Yes Lavina Hamman, MD  FLUoxetine (PROZAC) 10 MG capsule Take 10 mg by mouth daily.  08/17/17  Yes [provider]  fluticasone (FLONASE) 50 MCG/ACT nasal spray Place 1 spray into both nostrils as needed for allergies or rhinitis.   Yes [provider]  furosemide (LASIX) 20 MG tablet Take 1 tablet (20 mg total) by mouth daily. 10/23/17  Yes Daune Perch, NP  gabapentin (NEURONTIN) 300 MG capsule Take 600 mg by mouth at bedtime.  07/17/16  Yes [provider]  hydrOXYzine (ATARAX/VISTARIL) 10 MG tablet Take 10 mg by mouth at bedtime as needed for sleep or anxiety. 08/17/17  Yes [provider]  LORazepam (ATIVAN) 0.5 MG tablet Take 1 tablet (0.5 mg total) by mouth 3 (three) times daily as needed for anxiety. 11/14/17  Yes Upstill, Nehemiah Settle, PA-C  magnesium oxide (MAG-OX) 400 (241.3 Mg) MG tablet Take 1 tablet (400 mg total) by mouth daily. 05/24/17  Yes Nita Sells, MD  metFORMIN (GLUCOPHAGE) 1000 MG tablet Take 1,000 mg by mouth daily. 12/01/16  Yes [provider]  metoprolol tartrate (LOPRESSOR) 25 MG tablet Take 25 mg by mouth 2 (two) times daily.   Yes [provider]  omeprazole (PRILOSEC) 40 MG capsule Take 40 mg by mouth daily.    Yes [provider]  oxymetazoline (AFRIN) 0.05 % nasal spray Place 1 spray into both nostrils as needed for congestion.   Yes [provider]  polyethylene glycol (MIRALAX / GLYCOLAX) packet Take 17 g by mouth daily as needed for mild constipation.  06/12/17  Yes Lavina Hamman, MD  potassium chloride (K-DUR,KLOR-CON) 10 MEQ tablet Take 2 tablets (20 mEq total) by mouth daily. 06/12/17  Yes Lavina Hamman, MD  Rivaroxaban (XARELTO) 15 MG TABS tablet Take 15 mg by mouth daily with supper. 07/17/16  Yes [provider]    Physical Exam: Vitals:   11/21/17 1013 11/21/17 1100 11/21/17 1200 11/21/17 1217  BP: (!) 158/79 (!) 167/90 (!) 164/84   Pulse: 64 73 65   Resp: 15 19 17    Temp:      TempSrc:      SpO2: 100% 99% 100%   Weight:    50.3 kg (110 lb 14.3 oz)     . General: Elderly female who appears calm and comfortable and is NAD . Eyes:  PERRL, EOMI, normal lids, iris . ENT:  grossly normal hearing, lips & tongue. Lips dry and peeling; adentulous . Neck:  no LAD, masses or thyromegaly; no carotid bruits . Cardiovascular:  Normal rate. Reg rhythm. 4/6 systolic M at RUSB. No /r/g. No LE edema.  Marland Kitchen Respiratory:   CTA bilaterally with no wheezes/rales/rhonchi.  Normal respiratory effort. . Abdomen:  soft, NT, ND, NABS . Back:   normal alignment, no CVAT . Skin:  no rash or induration seen on limited exam . Musculoskeletal:  grossly normal tone BUE/BLE, good ROM, no bony abnormality . Lower extremity:  No LE edema.  Limited foot exam with no ulcerations.  2+ distal pulses. Marland Kitchen Psychiatric:  grossly normal mood and affect, speech fluent and appropriate, AOx3 . Neurologic:  CN 2-12 grossly intact, moves all extremities in coordinated fashion, sensation intact. Does show some difficulty with short term recall   Radiological Exams on Admission: Ct Head Wo Contrast  Result Date: 11/21/2017 CLINICAL DATA:  84 year old female with altered mental status. Possible overdose. EXAM: CT HEAD WITHOUT CONTRAST TECHNIQUE: Contiguous axial images were obtained from the base of the skull through the vertex without intravenous contrast. COMPARISON:  11/13/2017 and prior CTs FINDINGS: Brain: No evidence of acute infarction,  hemorrhage,  hydrocephalus, extra-axial collection or mass lesion/mass effect. Mild atrophy and moderate chronic small-vessel white matter ischemic changes are again noted. Vascular: Carotid atherosclerotic calcifications again noted. Skull: Normal. Negative for fracture or focal lesion. Sinuses/Orbits: No acute abnormality Other: None IMPRESSION: 1. No evidence of acute intracranial abnormality 2. Atrophy and chronic small-vessel white matter ischemic changes. Electronically Signed   By: Margarette Canada M.D.   On: 11/21/2017 10:52    EKG: Independently reviewed.  NSR with rate 63; nonspecific ST changes with no evidence of acute ischemia, QTc 574   Labs on Admission: I have personally reviewed the available labs and imaging studies at the time of the admission.  Pertinent labs:  APAP 962 EtOH <95 Salicylate <7 LFTs normal INR 1.18 Hgb 10.6 Creat 0.78 CO2 24  Assessment/Plan Principal Problem:   Paracetamol (acetaminophen) overdose, accidental or unintentional, initial encounter Active Problems:   Diabetes mellitus (HCC)   Gastroesophageal reflux disease without esophagitis   Generalized anxiety disorder   Hypercholesterolemia   Hypertension   AF (paroxysmal atrial fibrillation) (HCC)   Primary insomnia   S/P AVR (aortic valve replacement)   Prolonged QT interval   Protein-calorie malnutrition, severe   Chronic systolic CHF (congestive heart failure) (HCC)   Acetaminophen OD, accidental / unintentional: pt has been taking unknown quantities of this as she has had access to a bottle, and is unsure of how much she has been taking, but this is a relatively acute problem as she has been taking it for back pain that started after a fall 10 days ago. LFTs are WNL on initial check.  -NAC protocol initiated per poison control with loading dose and maintenance gtt (see below image) -INR 1.18 on presentation to ED -As pt is tolerating po and VSS, and AST/ALT were normal on presentation, pt should have  ALT/AST and APAP level checked again 22 hours into protocol (around 1:45 pm tomorrow) -EKG showed QTc prolongation, which is chronic; however it is more prolonged today than it has been even in the recent past. Will ask cardiology if amiodarone may be reduced/ discontinued since in their last note they noted that she was not having any atrial fib. -Monitor EKG - repeat tomorrow a.m.     Back pain -will check lumbar plain films as pt has had continued lower back pain since her fall -alternate analgesia will need to be pursued at least in the short term / until pt can be monitored with her medications. Will order tramadol prn for now.  Insomnia, chronic, GAD -Spoke with family about long-term BZDs not being a great option for this elderly patient with h/o falls and memory concerns. For the same reason, anticholinergics should also be used sparingly. -Ambien was d/c'd by pt's PCP; atarax has not been effective (although unclear if she's actually tried it) -as pt has been on ativan for 10 years will not stop this abruptly; rather she will need a slow wean. Discussed this in depth with pt and family -Will try small dose of mirtazapine, 7.5 mg tonight. Should also help with her appetite and depression / anxiety. Spoke with Pharmacy who did not feel it was a significant risk as far as QT prolongation, although risk could not be entirely ruled out. If this is not effective or tolerated, another option could be trazodone. -EKG in a.m. to ensure no worsened QT prolongation -cont buproprion 150 mg daily -will need to discern why pt has a negative BZD on urine tox. Is possible to have false  negative but given pt's age and chronic use, would be highly unusual. May need to involve risk management/legal.  PAF: on amiodarone -as above, will ask cards if this can be reduced or discontinued given QTc prolongation  S/p AVR on Xarelto -continue xarelto  HTN -cont norvasc, lopressor, lasix  DM2 -cont  metformin -SSI -carb controlled diet  GERD -cont PPI   DVT prophylaxis:  Xarelto Code Status: DNR - confirmed with patient/family Family Communication: Son [name] and his wife Hettie at bedside. [phone]  Disposition Plan: TBD once clinically improved Consults called: Social Work consulted for placement concerns - pt may no longer be good candidate for independent living given frequent falls and unintentional medication overdose. PT/OT consulted as well.  Admission status: Admit - It is my clinical opinion that admission to INPATIENT is reasonable and necessary because of the expectation that this patient will require hospital care that crosses at least 2 midnights to treat this condition based on the medical complexity of the problems presented.  Given the aforementioned information, the predictability of an adverse outcome is felt to be significant.    Janora Norlander MD [facility]  If note is complete, please contact covering daytime or nighttime physician. [email or web address] Password [facility]  11/21/2017, 12:57 PM

## 2017-11-21 NOTE — ED Notes (Signed)
Pt. On Purewick

## 2017-11-22 DIAGNOSIS — T391X1A Poisoning by 4-Aminophenol derivatives, accidental (unintentional), initial encounter: Principal | ICD-10-CM

## 2017-11-22 LAB — HEPATIC FUNCTION PANEL
ALT: 14 U/L (ref 0–44)
AST: 18 U/L (ref 15–41)
Albumin: 3.3 g/dL — ABNORMAL LOW (ref 3.5–5.0)
Alkaline Phosphatase: 73 U/L (ref 38–126)
Bilirubin, Direct: <0.1 mg/dL (ref 0.0–0.2)
Total Bilirubin: 0.6 mg/dL (ref 0.3–1.2)
Total Protein: 7 g/dL (ref 6.5–8.1)

## 2017-11-22 LAB — GLUCOSE, CAPILLARY
Glucose-Capillary: 105 mg/dL — ABNORMAL HIGH (ref 70–99)
Glucose-Capillary: 166 mg/dL — ABNORMAL HIGH (ref 70–99)
Glucose-Capillary: 98 mg/dL (ref 70–99)
Glucose-Capillary: 120 mg/dL — ABNORMAL HIGH (ref 70–99)

## 2017-11-22 LAB — ACETAMINOPHEN LEVEL: Acetaminophen (Tylenol), Serum: <10 ug/mL — ABNORMAL LOW (ref 10–30)

## 2017-11-22 NOTE — Clinical Social Work Note (Signed)
Clinical Social Work Assessment  Patient Details  Name: Angela Hines MRN: [id number] Date of Birth: [birth year]  Date of referral:  11/22/17               Reason for consult:  Facility Placement                Permission sought to share information with:  Facility Sport and exercise psychologist, Family Supports Permission granted to share information::  Yes, Verbal Permission Granted  Name::     Angela Hines  Agency::  snf  Relationship::  son  Contact Information:     Housing/Transportation Living arrangements for the past 2 months:  [place] of Information:  Patient, Adult Children Patient Interpreter Needed:  None Criminal Activity/Legal Involvement Pertinent to Current Situation/Hospitalization:  No - Comment as needed Significant Relationships:  Adult Children Lives with:  Self Do you feel safe going back to the place where you live?  No Need for family participation in patient care:  No (Coment)  Care giving concerns:  Pt lives at independent living facility and has been having falls leading up to admission.  Cannot get sufficient supervision/assistnace in current level of care.   Social Worker assessment / plan:  CSW met with pt to discuss issues leading up to admission and staff feeling that she would benefit from SNF.  CSW explained that PT would be evaluating her and that likely they would recommend SNF stay- pt reports she was at SNF earlier this year and had a good experience.  Employment status:  Retired Forensic scientist:  Medicare PT Recommendations:  Not assessed at this time [place] / Referral to community resources:  [place]  Patient/Family's Response to care:  Agreeable to SNF stay and [facility] before  Patient/Family's Understanding of and Emotional Response to Diagnosis, Current Treatment, and Prognosis:  Pt seems to have good understanding of condition and is realistic about increased  needs at this time- good humored despite new limitations.  Emotional Assessment Appearance:  Appears stated age Attitude/Demeanor/Rapport:    Affect (typically observed):  Appropriate, Accepting, Pleasant Orientation:  Oriented to Situation, Oriented to  Time, Oriented to Place, Oriented to Self Alcohol / Substance use:  Not Applicable Psych involvement (Current and /or in the community):  No (Comment)  Discharge Needs  Concerns to be addressed:  Care Coordination Readmission within the last 30 days:  No Current discharge risk:  Physical Impairment Barriers to Discharge:  Continued Medical Work up   Jorge Ny, LCSW 11/22/2017, 2:58 PM

## 2017-11-22 NOTE — Progress Notes (Signed)
PHARMACIST - PHYSICIAN COMMUNICATION  CONCERNING:  Mucomyst   RECOMMENDATION: To stop Mucomyst  DESCRIPTION: Talked with Poison control about recent labs Acetaminophen level < 10 INR ok LFTs normal  Thank you Anette Guarneri, PharmD

## 2017-11-22 NOTE — Evaluation (Signed)
Occupational Therapy Evaluation Patient Details Name: Angela Hines MRN: [id number] DOB: 1932/10/02 Today's Date: 11/22/2017    History of Present Illness 84 year old with past medical history relevant for paroxysmal atrial fibrillation on rivaroxaban, type 2 diabetes on oral hypoglycemics, anxiety/depression, hyperlipidemia, chronic pain, chronic systolic heart failure with EF of 40 to 45% with diffuse hypokinesis by echo on 7 06/11/2017, status post bioprosthetic aortic valve replacement complicated by moderate stenosis by echo on 06/11/2017, admitted with accidental overdose of acetaminophen   Clinical Impression   Pt with decline in function and safety with ADLs and ADL mobility with decreased strength, balance, endurance and cognition. Pt with Poor safety awareness with inappropriate answers to questions and she admitted to not safely using her RW at her ILF. Pt with LOB upon sit - stand from bed to RW and reports feeling weak. Pt required moderate multimodal cues during functional mobility with RW despite multiple corrections from therapists. Recommending [facility] SNF for rehab after d/c for safest venue before return to independent living facility, however based upon cognitive impairments and reason(s) for this hospital admission, ALF placement transition may need to be considered in the future for safety. Pt would benefit from acute OT services to address impairments to maximize level of function and safety    Follow Up Recommendations  SNF;Supervision/Assistance - 24 hour    Equipment Recommendations  Other (comment)(TBD at next venue of care)    Recommendations for Other Services       Precautions / Restrictions Precautions Precautions: Fall Precaution Comments: Poor safety awareness Restrictions Weight Bearing Restrictions: No      Mobility Bed Mobility Overal bed mobility: Needs Assistance Bed Mobility: Supine to Sit;Sit to Supine     Supine to sit: Supervision Sit to supine:  Supervision   General bed mobility comments: no physical assist  Transfers Overall transfer level: Needs assistance   Transfers: Sit to/from Stand;Stand Pivot Transfers Sit to Stand: Min assist Stand pivot transfers: Min guard       General transfer comment: LOB during sit - stand at EOB, used RW and required mod verbal and physical cues for corect hand placement and safety    Balance Overall balance assessment: Needs assistance Sitting-balance support: No upper extremity supported;Feet supported Sitting balance-Leahy Scale: Fair     Standing balance support: Single extremity supported;Bilateral upper extremity supported;During functional activity Standing balance-Leahy Scale: Poor                             ADL either performed or assessed with clinical judgement   ADL Overall ADL's : Needs assistance/impaired Eating/Feeding: Independent;Sitting   Grooming: Wash/dry hands;Wash/dry face;Min guard;Standing   Upper Body Bathing: Min guard;Sitting   Lower Body Bathing: Minimal assistance;Sitting/lateral leans;Sit to/from stand;Cueing for safety   Upper Body Dressing : Min guard;Sitting   Lower Body Dressing: Minimal assistance;Sitting/lateral leans;Sit to/from stand;Cueing for safety   Toilet Transfer: Minimal assistance;Min guard;Ambulation;RW;Comfort height toilet;Grab bars;Cueing for safety   Toileting- Clothing Manipulation and Hygiene: Minimal assistance;Sit to/from stand;Cueing for safety   Tub/ Shower Transfer: Minimal assistance;Min guard;Ambulation;Rolling walker;Grab bars;Cueing for sequencing;Cueing for safety   Functional mobility during ADLs: Minimal assistance;Min guard;Cueing for safety;Cueing for sequencing;Rolling walker General ADL Comments: pt impulsive, unsafe hand placement depsite being corected by therapists multiple times     Vision Baseline Vision/History: Wears glasses Wears Glasses: Reading only Patient Visual Report: No change  from baseline       Perception     Praxis  Pertinent Vitals/Pain Pain Assessment: No/denies pain     Hand Dominance Right   Extremity/Trunk Assessment Upper Extremity Assessment Upper Extremity Assessment: Generalized weakness   Lower Extremity Assessment Lower Extremity Assessment: Defer to PT evaluation   Cervical / Trunk Assessment Cervical / Trunk Assessment: Normal   Communication Communication Communication: No difficulties   Cognition Arousal/Alertness: Awake/alert Behavior During Therapy: Impulsive Overall Cognitive Status: No family/caregiver present to determine baseline cognitive functioning                                 General Comments: Pt with inappropriate answers to questions, admits to unsafe use of RW. Required mod verbal and physical cues for corect hand placement and safety using RW despite multiple corrections by OT/PT   General Comments       Exercises     Shoulder Instructions      Home Living Family/patient expects to be discharged to:: Skilled nursing facility(ILF ([facility])) Living Arrangements: Other (Comment)(ILF) Available Help at Discharge: Available PRN/intermittently Type of Home: Independent living facility Home Access: Elevator     Home Layout: One level     Bathroom Shower/Tub: Tub/shower unit;Walk-in shower   Bathroom Toilet: Standard     Home Equipment: Grab bars - tub/shower;Grab bars - toilet;Tub bench;Walker - 4 wheels(rollater)          Prior Functioning/Environment Level of Independence: Independent with assistive device(s)  Gait / Transfers Assistance Needed: uses RW for ambulation (rollater) ADL's / Homemaking Assistance Needed: Does her own ADLs per pt report. Goes to dining hall for meals., assist for medicine             OT Problem List: Decreased strength;Decreased activity tolerance;Decreased cognition;Decreased knowledge of use of DME or AE;Impaired balance (sitting and/or  standing);Decreased coordination;Decreased safety awareness;Decreased knowledge of precautions      OT Treatment/Interventions: Self-care/ADL training;Balance training;Therapeutic exercise;DME and/or AE instruction;Therapeutic activities;Patient/family education    OT Goals(Current goals can be found in the care plan section) Acute Rehab OT Goals Patient Stated Goal: get better OT Goal Formulation: With patient Time For Goal Achievement: 12/06/17 Potential to Achieve Goals: Good ADL Goals Pt Will Perform Grooming: with supervision;with set-up;standing Pt Will Perform Upper Body Bathing: with supervision;with set-up;sitting Pt Will Perform Lower Body Bathing: with min guard assist;sit to/from stand Pt Will Perform Upper Body Dressing: with supervision;with set-up;sitting Pt Will Transfer to Toilet: with min guard assist;with supervision;ambulating;regular height toilet;grab bars Pt Will Perform Toileting - Clothing Manipulation and hygiene: with min guard assist;sit to/from stand Additional ADL Goal #1: Pt will use RW safely during transfer/transition to toilet and standing at sink for grooming with min verbal/physical cues  OT Frequency: Min 2X/week   Barriers to D/C: Decreased caregiver support  pt live sat ILF wihtout direct sup daily       Co-evaluation PT/OT/SLP Co-Evaluation/Treatment: Yes Reason for Co-Treatment: Necessary to address cognition/behavior during functional activity   OT goals addressed during session: ADL's and self-care;Proper use of Adaptive equipment and DME      AM-PAC PT "6 Clicks" Daily Activity     Outcome Measure Help from another person eating meals?: None Help from another person taking care of personal grooming?: A Little Help from another person toileting, which includes using toliet, bedpan, or urinal?: A Little Help from another person bathing (including washing, rinsing, drying)?: A Lot Help from another person to put on and taking off regular  upper body clothing?: A Little Help  from another person to put on and taking off regular lower body clothing?: A Lot 6 Click Score: 17   End of Session Equipment Utilized During Treatment: Gait belt;Rolling walker;Other (comment)(3 in 1)  Activity Tolerance: Patient limited by fatigue Patient left: in bed;with call bell/phone within reach;with bed alarm set  OT Visit Diagnosis: Unsteadiness on feet (R26.81);Other abnormalities of gait and mobility (R26.89);Muscle weakness (generalized) (M62.81);Other symptoms and signs involving cognitive function                Time: 1354-1413 OT Time Calculation (min): 19 min Charges:  OT General Charges $OT Visit: 1 Visit OT Evaluation $OT Eval Low Complexity: 1 Low    Britt Bottom 11/22/2017, 2:33 PM

## 2017-11-22 NOTE — Evaluation (Signed)
Physical Therapy Evaluation Patient Details Name: Angela Hines MRN: [id number] DOB: Sep 01, 1932 Today's Date: 11/22/2017   History of Present Illness  84 year old with past medical history relevant for paroxysmal atrial fibrillation on rivaroxaban, type 2 diabetes on oral hypoglycemics, anxiety/depression, hyperlipidemia, chronic pain, chronic systolic heart failure with EF of 40 to 45% with diffuse hypokinesis by echo on 7 06/11/2017, status post bioprosthetic aortic valve replacement complicated by moderate stenosis by echo on 06/11/2017, admitted with accidental overdose of acetaminophen  Clinical Impression  PTA pt living in independent living facility, where she ambulated with a Rollator and received assist with medication and meals. Pt currently limited in safe mobility by decreased strength, balance, endurance and cognition/safety awareness. Pt requires supervision for bed mobility but requires minA for transfers and ambulation of household distances requiring multimodal cuing for use of RW and safe transfer to/from toilet. PT recommends SNF level rehab at discharge for improving strength, balance and endurance as well as to improve cognition and safety awareness. PT will continue to follow acutely to maximize mobility.     Follow Up Recommendations SNF    Equipment Recommendations  None recommended by PT       Precautions / Restrictions Precautions Precautions: Fall Precaution Comments: Poor safety awareness Restrictions Weight Bearing Restrictions: No      Mobility  Bed Mobility Overal bed mobility: Needs Assistance Bed Mobility: Supine to Sit;Sit to Supine     Supine to sit: Supervision Sit to supine: Supervision   General bed mobility comments: no physical assist  Transfers Overall transfer level: Needs assistance   Transfers: Sit to/from Stand;Stand Pivot Transfers Sit to Stand: Min assist Stand pivot transfers: Min guard       General transfer comment: LOB  during sit - stand at EOB, used RW and required mod verbal and physical cues for corect hand placement and safety  Ambulation/Gait Ambulation/Gait assistance: Min assist Gait Distance (Feet): 80 Feet Assistive device: Rolling walker (2 wheeled) Gait Pattern/deviations: Step-through pattern;Decreased stride length;Shuffle Gait velocity: decreased Gait velocity interpretation: <1.8 ft/sec, indicate of risk for recurrent falls General Gait Details: minA for steadying with ambulation, vc for proximity to RW, and for increased BoS, pt with 1 x LoB requiring minA to steady, after 40 feet of ambulation pt states, "I feel like my legs are going to give out. ", returned to room with mildly unstable gait, no knee buckling      Balance Overall balance assessment: Needs assistance Sitting-balance support: No upper extremity supported;Feet supported Sitting balance-Leahy Scale: Fair     Standing balance support: Single extremity supported;Bilateral upper extremity supported;During functional activity Standing balance-Leahy Scale: Poor                               Pertinent Vitals/Pain Pain Assessment: No/denies pain    Home Living Family/patient expects to be discharged to:: Skilled nursing facility(ILF ([facility])) Living Arrangements: Other (Comment)(ILF) Available Help at Discharge: Available PRN/intermittently Type of Home: Independent living facility Home Access: [place]: One level Home Equipment: Grab bars - tub/shower;Grab bars - toilet;Tub bench;Walker - 4 wheels(rollater)      Prior Function Level of Independence: Independent with assistive device(s)   Gait / Transfers Assistance Needed: uses RW for ambulation (rollater)  ADL's / Homemaking Assistance Needed: Does her own ADLs per pt report. Goes to dining hall for meals., assist for medicine         Hand Dominance  Dominant Hand: Right    Extremity/Trunk Assessment   Upper Extremity  Assessment Upper Extremity Assessment: Generalized weakness    Lower Extremity Assessment Lower Extremity Assessment: Generalized weakness    Cervical / Trunk Assessment Cervical / Trunk Assessment: Normal  Communication   Communication: No difficulties  Cognition Arousal/Alertness: Awake/alert Behavior During Therapy: Impulsive Overall Cognitive Status: No family/caregiver present to determine baseline cognitive functioning                                 General Comments: Pt with inappropriate answers to questions, admits to unsafe use of RW. Required mod verbal and physical cues for corect hand placement and safety using RW despite multiple corrections by OT/PT             Assessment/Plan    PT Assessment Patient needs continued PT services  PT Problem List Decreased strength;Decreased activity tolerance;Decreased balance;Decreased mobility;Decreased safety awareness;Decreased cognition;Decreased knowledge of use of DME       PT Treatment Interventions DME instruction;Gait training;Functional mobility training;Therapeutic activities;Therapeutic exercise;Balance training;Cognitive remediation;Patient/family education    PT Goals (Current goals can be found in the Care Plan section)  Acute Rehab PT Goals Patient Stated Goal: get better PT Goal Formulation: With patient Time For Goal Achievement: 12/06/17 Potential to Achieve Goals: Fair    Frequency Min 2X/week   Barriers to discharge Decreased caregiver support      Co-evaluation PT/OT/SLP Co-Evaluation/Treatment: Yes Reason for Co-Treatment: Necessary to address cognition/behavior during functional activity PT goals addressed during session: Mobility/safety with mobility;Balance OT goals addressed during session: ADL's and self-care;Proper use of Adaptive equipment and DME       AM-PAC PT "6 Clicks" Daily Activity  Outcome Measure Difficulty turning over in bed (including adjusting bedclothes,  sheets and blankets)?: A Little Difficulty moving from lying on back to sitting on the side of the bed? : A Little Difficulty sitting down on and standing up from a chair with arms (e.g., wheelchair, bedside commode, etc,.)?: Unable Help needed moving to and from a bed to chair (including a wheelchair)?: A Little Help needed walking in hospital room?: A Little Help needed climbing 3-5 steps with a railing? : A Lot 6 Click Score: 15    End of Session Equipment Utilized During Treatment: Gait belt Activity Tolerance: Patient tolerated treatment well Patient left: in bed;with call bell/phone within reach;with bed alarm set Nurse Communication: Mobility status PT Visit Diagnosis: Unsteadiness on feet (R26.81);Other abnormalities of gait and mobility (R26.89);Muscle weakness (generalized) (M62.81);Difficulty in walking, not elsewhere classified (R26.2)    Time: 1353-1406 PT Time Calculation (min) (ACUTE ONLY): 13 min   Charges:   PT Evaluation $PT Eval Moderate Complexity: 1 Mod          Laisha Rau B. Migdalia Dk PT, DPT Acute Rehabilitation  [phone] Pager [phone]    [place] 11/22/2017, 4:53 PM

## 2017-11-22 NOTE — NC FL2 (Signed)
[place] LEVEL OF CARE SCREENING TOOL     IDENTIFICATION  Patient Name: Angela Hines Birthdate: Jul 05, 1932 Sex: female Admission Date (Current Location): 11/21/2017  [facility] and [place] Number:  Herbalist and Address:  [facility], [place] [address], [place], [place] [address]      Provider Number: [id number]  Attending Physician Name and Address:  Cristy Folks, MD  Relative Name and Phone Number:       Current Level of Care: Hospital Recommended Level of Care: [place] Prior Approval Number:    Date Approved/Denied:   PASRR Number: [id number]  Discharge Plan: SNF    Current Diagnoses: Patient Active Problem List   Diagnosis Date Noted  . Paracetamol (acetaminophen) overdose, accidental or unintentional, initial encounter 11/21/2017  . Nausea & vomiting 09/10/2017  . Chest pain 09/10/2017  . High anion gap metabolic acidosis 03/50/0938  . Chronic systolic CHF (congestive heart failure) ([place]) 09/10/2017  . Protein-calorie malnutrition, severe 06/11/2017  . Prolonged QT interval   . Diabetes mellitus ([place]) 07/17/2016  . Gastroesophageal reflux disease without esophagitis 07/17/2016  . Generalized anxiety disorder 07/17/2016  . Hypercholesterolemia 07/17/2016  . Hypertension 07/17/2016  . Status post placement of implantable loop recorder 07/17/2016  . AF (paroxysmal atrial fibrillation) ([place]) 07/17/2016  . Primary insomnia 07/17/2016  . S/P AVR (aortic valve replacement) 07/17/2016  . History of syncope 08/07/2014    Orientation RESPIRATION BLADDER Height & Weight     Time, Self, Situation, Place  Normal Continent Weight: 100 lb 4.9 oz (45.5 kg) Height:  5\' 7"  (170.2 cm)  BEHAVIORAL SYMPTOMS/MOOD NEUROLOGICAL BOWEL NUTRITION STATUS      Continent Diet(cardiac/carb modified)  AMBULATORY STATUS COMMUNICATION OF NEEDS Skin   Limited Assist Verbally Normal                        Personal Care Assistance Level of Assistance  Bathing, Dressing Bathing Assistance: Limited assistance   Dressing Assistance: Limited assistance     Functional Limitations Info             SPECIAL CARE FACTORS FREQUENCY  PT (By licensed PT), OT (By licensed OT)     PT Frequency: 5/wk OT Frequency: 5/wk            Contractures      Additional Factors Info  Code Status, Allergies Code Status Info: DNR Allergies Info: Clonidine, Captopril, Codeine, Ceclor Cefaclor           Current Medications (11/22/2017):  This is the current hospital active medication list Current Facility-Administered Medications  Medication Dose Route Frequency Provider Last Rate Last Dose  . albuterol (PROVENTIL) (2.5 MG/3ML) 0.083% nebulizer solution 3 mL  3 mL Inhalation Q6H PRN Janora Norlander, MD      . amLODipine (NORVASC) tablet 2.5 mg  2.5 mg Oral Daily Janora Norlander, MD   2.5 mg at 11/22/17 1020  . bisacodyl (DULCOLAX) EC tablet 5 mg  5 mg Oral PRN Janora Norlander, MD      . bismuth subsalicylate (PEPTO BISMOL) 262 MG/15ML suspension 30 mL  30 mL Oral Q6H PRN Janora Norlander, MD      . buPROPion (WELLBUTRIN XL) 24 hr tablet 150 mg  150 mg Oral Daily Janora Norlander, MD   150 mg at 11/22/17 1023  . feeding supplement (ENSURE ENLIVE) (ENSURE ENLIVE) liquid 237 mL  237 mL Oral Q24H Janora Norlander, MD  237 mL at 11/22/17 1026  . fluticasone (FLONASE) 50 MCG/ACT nasal spray 1 spray  1 spray Each Nare PRN Janora Norlander, MD      . furosemide (LASIX) tablet 20 mg  20 mg Oral Daily Janora Norlander, MD   20 mg at 11/22/17 1023  . gabapentin (NEURONTIN) capsule 600 mg  600 mg Oral QHS Janora Norlander, MD   600 mg at 11/21/17 2222  . insulin aspart (novoLOG) injection 0-5 Units  0-5 Units Subcutaneous QHS Janora Norlander, MD      . insulin aspart (novoLOG) injection 0-9 Units  0-9 Units Subcutaneous TID WC Janora Norlander, MD   2 Units at 11/22/17 1216  . LORazepam (ATIVAN) tablet  0.5 mg  0.5 mg Oral TID PRN Janora Norlander, MD      . magnesium oxide (MAG-OX) tablet 400 mg  400 mg Oral Daily Janora Norlander, MD   400 mg at 11/22/17 1022  . metFORMIN (GLUCOPHAGE) tablet 1,000 mg  1,000 mg Oral Q breakfast Janora Norlander, MD   1,000 mg at 11/22/17 1020  . metoprolol tartrate (LOPRESSOR) tablet 25 mg  25 mg Oral BID Janora Norlander, MD   25 mg at 11/22/17 1022  . mirtazapine (REMERON) tablet 7.5 mg  7.5 mg Oral QHS Janora Norlander, MD   7.5 mg at 11/21/17 2226  . pantoprazole (PROTONIX) EC tablet 40 mg  40 mg Oral Daily Janora Norlander, MD   40 mg at 11/22/17 1020  . polyethylene glycol (MIRALAX / GLYCOLAX) packet 17 g  17 g Oral Daily PRN Janora Norlander, MD      . Rivaroxaban Alveda Reasons) tablet 15 mg  15 mg Oral Q supper Janora Norlander, MD   15 mg at 11/21/17 1748  . traMADol (ULTRAM) tablet 50 mg  50 mg Oral Q6H PRN Janora Norlander, MD   50 mg at 11/21/17 2348     Discharge Medications: Please see discharge summary for a list of discharge medications.  Relevant Imaging Results:  Relevant Lab Results:   Additional Information SS#  [id number]  Jorge Ny, LCSW

## 2017-11-22 NOTE — Care Management Note (Signed)
Case Management Note  Patient Details  Name: Maeva Dant MRN: [id number] Date of Birth: [birth year]  Subjective/Objective:   From [facility] , presents with falls, weakness, tyelenol OD.                Action/Plan: NCM will follow for transition of care needs.   Expected Discharge Date:  11/23/17               Expected Discharge Plan:  Assisted Living / Rest Home  In-House Referral:  Clinical Social Work  Discharge planning Services  CM Consult  Post Acute Care Choice:    Choice offered to:     DME Arranged:    DME Agency:     HH Arranged:    HH Agency:     Status of Service:  In process, will continue to follow  If discussed at Long Length of Stay Meetings, dates discussed:    Additional Comments:  Zenon Mayo, RN 11/22/2017, 2:20 PM

## 2017-11-22 NOTE — Progress Notes (Signed)
PROGRESS NOTE    Angela Hines  OZH:[id number] DOB: 02-Oct-1932 DOA: 11/21/2017 PCP: Kelton Pillar, MD    Brief Narrative:  84 year old with past medical history relevant for paroxysmal atrial fibrillation on rivaroxaban, type 2 diabetes on oral hypoglycemics, anxiety/depression, hyperlipidemia, chronic pain, chronic systolic heart failure with EF of 40 to 45% with diffuse hypokinesis by echo on 7 06/11/2017, status post bioprosthetic aortic valve replacement complicated by moderate stenosis by echo on 06/11/2017, admitted with accidental overdose of acetaminophen.   Assessment & Plan:   Principal Problem:   Paracetamol (acetaminophen) overdose, accidental or unintentional, initial encounter Active Problems:   Diabetes mellitus ([place])   Gastroesophageal reflux disease without esophagitis   Generalized anxiety disorder   Hypercholesterolemia   Hypertension   AF (paroxysmal atrial fibrillation) ([facility])   Primary insomnia   S/P AVR (aortic valve replacement)   Prolonged QT interval   Protein-calorie malnutrition, severe   Chronic systolic CHF (congestive heart failure) ([place])   #) Accidental acetaminophen overdose: In the setting of very difficult to treat primary insomnia for which the patient ran out of her home lorazepam and so admitting excessive dose of Tylenol PM.  Acetaminophen level was 103 on admission however unclear when she took her last dose or with the total amount was.  Unfortunately there also appears to be evidence of chronic toxicity. -Continue IV N-acetylcysteine - Follow Rumack Matthew nomogram if possible - Monitor LFTs  #) Type 2 diabetes: -Hold metformin thousand milligrams daily -Sliding scale insulin, AC at bedtime  #) Chronic systolic heart failure: EF of 40 to 45% by echo in February 2019 -Continue metoprolol tartrate 25 mg twice daily - Continue furosemide 20 mg daily  #) Psych/anxiety/depression: -Continue fluoxetine 10 mg daily -Continue gabapentin  6 mg nightly -Continue lorazepam 0.5 mg 3 times daily as needed -Continue bupropion 150 mg daily  #) Hypertension: -Continue beta-blocker  #) Status post bioprosthetic AVR/paroxysmal atrial fibrillation: -Continue rivaroxaban 50 mg nightly -Continue beta-blocker  Fluids: Tolerating p.o. Elect lites: Monitor and supplement Nutrition: Heart healthy diet  Prophylaxis: Rivaroxaban  Disposition: Pending completion of N-acetylcysteine therapy and stabilization of LFTs  DNR -Continue amlodipine 2.5 mg daily  Consultants:   None  Procedures:   None  Antimicrobials:   None   Subjective: Patient is quite sleepy but reports she is doing well.  She denies any nausea, vomiting, diarrhea.  She denies any abdominal pain or right upper quadrant pain.  Objective: Vitals:   11/21/17 1657 11/21/17 1723 11/22/17 0018 11/22/17 0800  BP: (!) 177/86  133/71 (!) 129/59  Pulse: 74  71 65  Resp:   20 20  Temp: 98.8 F (37.1 C)  98.3 F (36.8 C) 97.8 F (36.6 C)  TempSrc: Oral   Axillary  SpO2: 98%  99% 97%  Weight:  45.5 kg (100 lb 4.9 oz)    Height:  5\' 7"  (1.702 m)      Intake/Output Summary (Last 24 hours) at 11/22/2017 1058 Last data filed at 11/21/2017 1826 Gross per 24 hour  Intake 290.2 ml  Output -  Net 290.2 ml   Filed Weights   11/21/17 1217 11/21/17 1723  Weight: 50.3 kg (110 lb 14.3 oz) 45.5 kg (100 lb 4.9 oz)    Examination:  General exam: Appears calm and comfortable  Respiratory system: Clear to auscultation. Respiratory effort normal. Cardiovascular system: Regular rate and rhythm, no murmurs Gastrointestinal system: Abdomen is nondistended, soft and nontender. No organomegaly or masses felt. Normal bowel sounds heard. Central nervous system:  Alert and oriented. No focal neurological deficits. Extremities: No lower extremity edema. Skin: Senile purpura Psychiatry: Judgement and insight appear normal. Mood & affect appropriate.     Data Reviewed: I  have personally reviewed following labs and imaging studies  CBC: Recent Labs  Lab 11/21/17 1015  WBC 8.2  HGB 10.6*  HCT 35.5*  MCV 87.9  PLT 102   Basic Metabolic Panel: Recent Labs  Lab 11/21/17 1015  NA 134*  K 3.8  CL 96*  CO2 24  GLUCOSE 148*  BUN 17  CREATININE 0.78  CALCIUM 9.8   GFR: Estimated Creatinine Clearance: 38.3 mL/min (by C-G formula based on SCr of 0.78 mg/dL). Liver Function Tests: Recent Labs  Lab 11/21/17 1015  AST 26  ALT 16  ALKPHOS 89  BILITOT 0.5  PROT 8.1  ALBUMIN 4.1   No results for input(s): LIPASE, AMYLASE in the last 168 hours. Recent Labs  Lab 11/21/17 1213  AMMONIA <9*   Coagulation Profile: Recent Labs  Lab 11/21/17 1213  INR 1.18   Cardiac Enzymes: No results for input(s): CKTOTAL, CKMB, CKMBINDEX, TROPONINI in the last 168 hours. BNP (last 3 results) No results for input(s): PROBNP in the last 8760 hours. HbA1C: No results for input(s): HGBA1C in the last 72 hours. CBG: Recent Labs  Lab 11/21/17 1552 11/21/17 1719 11/21/17 1956 11/22/17 0757  GLUCAP 124* 142* 110* 105*   Lipid Profile: No results for input(s): CHOL, HDL, LDLCALC, TRIG, CHOLHDL, LDLDIRECT in the last 72 hours. Thyroid Function Tests: No results for input(s): TSH, T4TOTAL, FREET4, T3FREE, THYROIDAB in the last 72 hours. Anemia Panel: No results for input(s): VITAMINB12, FOLATE, FERRITIN, TIBC, IRON, RETICCTPCT in the last 72 hours. Sepsis Labs: No results for input(s): PROCALCITON, LATICACIDVEN in the last 168 hours.  No results found for this or any previous visit (from the past 240 hour(s)).       Radiology Studies: Dg Lumbar Spine Complete  Result Date: 11/21/2017 CLINICAL DATA:  Low back pain for several days, no known injury, initial encounter EXAM: LUMBAR SPINE - COMPLETE 4+ VIEW COMPARISON:  09/10/2017 FINDINGS: Five lumbar type vertebral bodies are well visualized. Mild vertebral body height loss is noted at L2. This is of  uncertain chronicity. MRI would be more helpful to assess for acuity. Atherosclerotic changes are noted. Disc space narrowing at L5-S1 is seen. IMPRESSION: Mild L1 compression deformity new from the prior exam but of uncertain chronicity. Nonemergent MRI would be helpful for further evaluation as clinically necessary. Electronically Signed   By: Inez Catalina M.D.   On: 11/21/2017 14:39   Ct Head Wo Contrast  Result Date: 11/21/2017 CLINICAL DATA:  84 year old female with altered mental status. Possible overdose. EXAM: CT HEAD WITHOUT CONTRAST TECHNIQUE: Contiguous axial images were obtained from the base of the skull through the vertex without intravenous contrast. COMPARISON:  11/13/2017 and prior CTs FINDINGS: Brain: No evidence of acute infarction, hemorrhage, hydrocephalus, extra-axial collection or mass lesion/mass effect. Mild atrophy and moderate chronic small-vessel white matter ischemic changes are again noted. Vascular: Carotid atherosclerotic calcifications again noted. Skull: Normal. Negative for fracture or focal lesion. Sinuses/Orbits: No acute abnormality Other: None IMPRESSION: 1. No evidence of acute intracranial abnormality 2. Atrophy and chronic small-vessel white matter ischemic changes. Electronically Signed   By: Margarette Canada M.D.   On: 11/21/2017 10:52        Scheduled Meds: . amLODipine  2.5 mg Oral Daily  . buPROPion  150 mg Oral Daily  . feeding supplement (  ENSURE ENLIVE)  237 mL Oral Q24H  . furosemide  20 mg Oral Daily  . gabapentin  600 mg Oral QHS  . insulin aspart  0-5 Units Subcutaneous QHS  . insulin aspart  0-9 Units Subcutaneous TID WC  . magnesium oxide  400 mg Oral Daily  . metFORMIN  1,000 mg Oral Q breakfast  . metoprolol tartrate  25 mg Oral BID  . mirtazapine  7.5 mg Oral QHS  . pantoprazole  40 mg Oral Daily  . Rivaroxaban  15 mg Oral Q supper   Continuous Infusions: . acetylcysteine 15 mg/kg/hr (11/21/17 1826)     LOS: 1 day    Time spent:  [place], MD [facility]  If 7PM-7AM, please contact night-coverage [email or web address] Password [email or web address] 11/22/2017, 10:58 AM

## 2017-11-23 LAB — COMPREHENSIVE METABOLIC PANEL
AST: 18 U/L (ref 15–41)
Albumin: 3.2 g/dL — ABNORMAL LOW (ref 3.5–5.0)
BUN: 24 mg/dL — ABNORMAL HIGH (ref 8–23)
CO2: 27 mmol/L (ref 22–32)
Chloride: 97 mmol/L — ABNORMAL LOW (ref 98–111)
GFR calc non Af Amer: 58 mL/min — ABNORMAL LOW (ref >60)
Glucose, Bld: 107 mg/dL — ABNORMAL HIGH (ref 70–99)
Potassium: 2.9 mmol/L — ABNORMAL LOW (ref 3.5–5.1)
Total Protein: 6.5 g/dL (ref 6.5–8.1)

## 2017-11-23 LAB — COMPREHENSIVE METABOLIC PANEL WITH GFR
ALT: 13 U/L (ref 0–44)
Alkaline Phosphatase: 79 U/L (ref 38–126)
Anion gap: 14 (ref 5–15)
Calcium: 9.3 mg/dL (ref 8.9–10.3)
Creatinine, Ser: 0.9 mg/dL (ref 0.44–1.00)
GFR calc Af Amer: >60 mL/min (ref >60)
Sodium: 138 mmol/L (ref 135–145)
Total Bilirubin: 0.5 mg/dL (ref 0.3–1.2)

## 2017-11-23 LAB — CBC
HCT: 31.1 % — ABNORMAL LOW (ref 36.0–46.0)
Hemoglobin: 9.6 g/dL — ABNORMAL LOW (ref 12.0–15.0)
MCH: 26.6 pg (ref 26.0–34.0)
MCHC: 30.9 g/dL (ref 30.0–36.0)
MCV: 86.1 fL (ref 78.0–100.0)
Platelets: 348 10*3/uL (ref 150–400)
RBC: 3.61 MIL/uL — ABNORMAL LOW (ref 3.87–5.11)
RDW: 16 % — ABNORMAL HIGH (ref 11.5–15.5)
WBC: 9.2 K/uL (ref 4.0–10.5)

## 2017-11-23 LAB — GLUCOSE, CAPILLARY
Glucose-Capillary: 109 mg/dL — ABNORMAL HIGH (ref 70–99)
Glucose-Capillary: 126 mg/dL — ABNORMAL HIGH (ref 70–99)
Glucose-Capillary: 86 mg/dL (ref 70–99)
Glucose-Capillary: 151 mg/dL — ABNORMAL HIGH (ref 70–99)

## 2017-11-23 LAB — MAGNESIUM: Magnesium: 1.9 mg/dL (ref 1.7–2.4)

## 2017-11-23 MED ORDER — POTASSIUM CHLORIDE CRYS ER 20 MEQ PO TBCR
40.0000 meq | EXTENDED_RELEASE_TABLET | ORAL | Status: AC
  Administered 2017-11-23 (×2): 40 meq via ORAL
  Filled 2017-11-23 (×2): qty 2

## 2017-11-23 MED ORDER — POTASSIUM CHLORIDE 10 MEQ/100ML IV SOLN
10.0000 meq | INTRAVENOUS | Status: AC
  Administered 2017-11-23 (×2): 10 meq via INTRAVENOUS
  Filled 2017-11-23 (×2): qty 100

## 2017-11-23 NOTE — Discharge Summary (Addendum)
Physician Discharge Summary  Angela Hines JIR:[id number] DOB: [birth year] DOA: 11/21/2017  PCP: Kelton Pillar, MD  Admit date: 11/21/2017 Discharge date: 11/23/2017  Addendum 11/24/2017: Patient's vitals are stable and her lab work is stable.  Patient is still ready for discharge.   Admitted From: Assisted living facility Disposition: Skilled nursing facility  Recommendations for Outpatient Follow-up:  1. Follow up with PCP in 1-2 weeks 2. Please obtain CMP/CBC in one week 3.  Home Health: None Equipment/Devices: None  Discharge Condition: Stable CODE STATUS: DNR Diet recommendation: Heart Healthy   Brief/Interim Summary:  #) Accidental acetaminophen overdose: Patient apparently has a long history of insomnia and is been taking lorazepam for well over 10 years for this.  It is unclear if patient ran out of lorazepam and then attempted to use a combination of diphenhydramine with acetaminophen tablets or that her lorazepam was switched out for acetaminophen tablets and she had been taking those on mistake.  Regardless she was noted to have a Tylenol level of 103 on presentation with no clear time point.  The Rumack Matthews nomogram could not be used in this case.  Poison control was consulted and recommended N-acetylcysteine dosing.  Her liver function tests were monitored daily and they were stable.  She never had any elevations.  She was discharged to a nursing home in stable condition.  #) Type 2 diabetes: Patient's metformin was held and she was maintained on sliding scale insulin.  #) Chronic systolic heart failure: EF is 40 to 45% with global hypokinesis by echo in February 2019.  She was continued on her home metoprolol tartrate and furosemide.  #) Psych/anxiety/depression: Patient was continued on home fluoxetine, gabapentin, lorazepam, bupropion.  #) Hypertension: Patient was continued on home beta-blocker and amlodipine.  #) Status post bioprosthetic AVR/paroxysmal atrial  for ablation: Patient was continued on beta-blocker per above as well as rivaroxaban.  Discharge Diagnoses:  Principal Problem:   Paracetamol (acetaminophen) overdose, accidental or unintentional, initial encounter Active Problems:   Diabetes mellitus ([place])   Gastroesophageal reflux disease without esophagitis   Generalized anxiety disorder   Hypercholesterolemia   Hypertension   AF (paroxysmal atrial fibrillation) ([facility])   Primary insomnia   S/P AVR (aortic valve replacement)   Prolonged QT interval   Protein-calorie malnutrition, severe   Chronic systolic CHF (congestive heart failure) [facility])    Discharge Instructions  Discharge Instructions    Call MD for:  difficulty breathing, headache or visual disturbances   Complete by:  As directed    Call MD for:  hives   Complete by:  As directed    Call MD for:  persistant dizziness or light-headedness   Complete by:  As directed    Call MD for:  persistant nausea and vomiting   Complete by:  As directed    Call MD for:  redness, tenderness, or signs of infection (pain, swelling, redness, odor or green/yellow discharge around incision site)   Complete by:  As directed    Call MD for:  severe uncontrolled pain   Complete by:  As directed    Call MD for:  temperature >100.4   Complete by:  As directed    Diet - low sodium heart healthy   Complete by:  As directed    Discharge instructions   Complete by:  As directed    Please follow-up with your primary care doctor in 1 to 2 weeks.  Please limit your acetaminophen to 3 g a day.   Increase activity slowly  Complete by:  As directed      Allergies as of 11/23/2017      Reactions   Clonidine Swelling, Other (See Comments)   "head swelling"   Captopril Swelling   Codeine Nausea And Vomiting, Other (See Comments)   GI Intolerance   Ceclor [cefaclor] Rash      Medication List    STOP taking these medications   TYLENOL PM EXTRA STRENGTH 25-500 MG Tabs tablet Generic drug:   diphenhydramine-acetaminophen     TAKE these medications   albuterol 108 (90 Base) MCG/ACT inhaler Commonly known as:  PROVENTIL HFA;VENTOLIN HFA Inhale 2 puffs into the lungs every 6 (six) hours as needed for shortness of breath.   amLODipine 2.5 MG tablet Commonly known as:  NORVASC Take 1 tablet (2.5 mg total) by mouth daily.   bisacodyl 5 MG EC tablet Commonly known as:  DULCOLAX Take 5 mg by mouth as needed for moderate constipation.   bismuth subsalicylate 623 JS/28BT suspension Commonly known as:  PEPTO BISMOL Take 30 mLs by mouth every 6 (six) hours as needed for indigestion.   buPROPion 150 MG 24 hr tablet Commonly known as:  WELLBUTRIN XL Take 150 mg by mouth daily.   feeding supplement (ENSURE ENLIVE) Liqd Take 237 mLs by mouth 3 (three) times daily between meals. What changed:  when to take this   FLUoxetine 10 MG capsule Commonly known as:  PROZAC Take 10 mg by mouth daily.   fluticasone 50 MCG/ACT nasal spray Commonly known as:  FLONASE Place 1 spray into both nostrils as needed for allergies or rhinitis.   furosemide 20 MG tablet Commonly known as:  LASIX Take 1 tablet (20 mg total) by mouth daily.   gabapentin 300 MG capsule Commonly known as:  NEURONTIN Take 600 mg by mouth at bedtime.   LORazepam 0.5 MG tablet Commonly known as:  ATIVAN Take 1 tablet (0.5 mg total) by mouth 3 (three) times daily as needed for anxiety.   magnesium oxide 400 (241.3 Mg) MG tablet Commonly known as:  MAG-OX Take 1 tablet (400 mg total) by mouth daily.   metFORMIN 1000 MG tablet Commonly known as:  GLUCOPHAGE Take 1,000 mg by mouth daily.   metoprolol tartrate 25 MG tablet Commonly known as:  LOPRESSOR Take 25 mg by mouth 2 (two) times daily.   omeprazole 40 MG capsule Commonly known as:  PRILOSEC Take 40 mg by mouth daily.   oxymetazoline 0.05 % nasal spray Commonly known as:  AFRIN Place 1 spray into both nostrils as needed for congestion.    polyethylene glycol packet Commonly known as:  MIRALAX / GLYCOLAX Take 17 g by mouth daily as needed for mild constipation.   Rivaroxaban 15 MG Tabs tablet Commonly known as:  XARELTO Take 15 mg by mouth daily with supper.       Allergies  Allergen Reactions  . Clonidine Swelling and Other (See Comments)    "head swelling"  . Captopril Swelling  . Codeine Nausea And Vomiting and Other (See Comments)    GI Intolerance   . Ceclor [Cefaclor] Rash    Consultations:  None   Procedures/Studies: Dg Lumbar Spine Complete  Result Date: 11/21/2017 CLINICAL DATA:  Low back pain for several days, no known injury, initial encounter EXAM: LUMBAR SPINE - COMPLETE 4+ VIEW COMPARISON:  09/10/2017 FINDINGS: Five lumbar type vertebral bodies are well visualized. Mild vertebral body height loss is noted at L2. This is of uncertain chronicity. MRI would be more helpful  to assess for acuity. Atherosclerotic changes are noted. Disc space narrowing at L5-S1 is seen. IMPRESSION: Mild L1 compression deformity new from the prior exam but of uncertain chronicity. Nonemergent MRI would be helpful for further evaluation as clinically necessary. Electronically Signed   By: Inez Catalina M.D.   On: 11/21/2017 14:39   Ct Head Wo Contrast  Result Date: 11/21/2017 CLINICAL DATA:  84 year old female with altered mental status. Possible overdose. EXAM: CT HEAD WITHOUT CONTRAST TECHNIQUE: Contiguous axial images were obtained from the base of the skull through the vertex without intravenous contrast. COMPARISON:  11/13/2017 and prior CTs FINDINGS: Brain: No evidence of acute infarction, hemorrhage, hydrocephalus, extra-axial collection or mass lesion/mass effect. Mild atrophy and moderate chronic small-vessel white matter ischemic changes are again noted. Vascular: Carotid atherosclerotic calcifications again noted. Skull: Normal. Negative for fracture or focal lesion. Sinuses/Orbits: No acute abnormality Other: None  IMPRESSION: 1. No evidence of acute intracranial abnormality 2. Atrophy and chronic small-vessel white matter ischemic changes. Electronically Signed   By: Margarette Canada M.D.   On: 11/21/2017 10:52   Ct Head Wo Contrast  Result Date: 11/13/2017 CLINICAL DATA:  Mechanical fall while getting out of bed. Struck posterior head. Patient is on blood thinners. No loss of consciousness. EXAM: CT HEAD WITHOUT CONTRAST CT CERVICAL SPINE WITHOUT CONTRAST TECHNIQUE: Multidetector CT imaging of the head and cervical spine was performed following the standard protocol without intravenous contrast. Multiplanar CT image reconstructions of the cervical spine were also generated. COMPARISON:  CT head 09/10/2017. CT head and cervical spine 09/08/2017 FINDINGS: CT HEAD FINDINGS Brain: Mild cerebral atrophy. Old lacunar infarct in the left basal ganglia. No ventricular dilatation. No mass effect or midline shift. No abnormal extra-axial fluid collections. Gray-white matter junctions are distinct. Basal cisterns are not effaced. No acute intracranial hemorrhage. Vascular: Intracranial arterial vascular calcifications are present. Skull: Calvarium appears intact. Sinuses/Orbits: Paranasal sinuses and mastoid air cells are clear. Other: None. CT CERVICAL SPINE FINDINGS Alignment: Straightening of usual cervical lordosis with slight anterior subluxation at C5-6. This is unchanged since previous study and is likely degenerative. Normal alignment of the facet joints. C1-2 articulation appears intact. Skull base and vertebrae: Skull base appears intact. No vertebral compression deformities. No focal bone lesion or bone destruction. Soft tissues and spinal canal: No prevertebral fluid or swelling. No visible canal hematoma. Disc levels: Degenerative changes throughout the cervical spine with narrowed interspaces and endplate hypertrophic changes. Vertebral degenerative changes are most prominent at C1-2, C3-4, and C6-7 levels. 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 Upper chest: Emphysematous changes and fibrosis in the lung apices. Nodular scarring. Other: None. IMPRESSION: 1. No acute intracranial abnormalities. Mild cerebral atrophy and old lacunar infarcts in the deep white matter. 2. Unchanged alignment of the cervical spine since previous study. Prominent diffuse degenerative changes. No acute displaced fractures identified. 3. Emphysematous changes, fibrosis, and nodular scarring in the lung apices. Electronically Signed   By: Lucienne Capers M.D.   On: 11/13/2017 22:29   Ct Cervical Spine Wo Contrast  Result Date: 11/13/2017 CLINICAL DATA:  Mechanical fall while getting out of bed. Struck posterior head. Patient is on blood thinners. No loss of consciousness. EXAM: CT HEAD WITHOUT CONTRAST CT CERVICAL SPINE WITHOUT CONTRAST TECHNIQUE: Multidetector CT imaging of the head and cervical spine was performed following the standard protocol without intravenous contrast. Multiplanar CT image reconstructions of the cervical spine were also generated. COMPARISON:  CT  head 09/10/2017. CT head and cervical spine 09/08/2017 FINDINGS: CT HEAD FINDINGS Brain: Mild cerebral atrophy. Old lacunar infarct in the left basal ganglia. No ventricular dilatation. No mass effect or midline shift. No abnormal extra-axial fluid collections. Gray-white matter junctions are distinct. Basal cisterns are not effaced. No acute intracranial hemorrhage. Vascular: Intracranial arterial vascular calcifications are present. Skull: Calvarium appears intact. Sinuses/Orbits: Paranasal sinuses and mastoid air cells are clear. Other: None. CT CERVICAL SPINE FINDINGS Alignment: Straightening of usual cervical lordosis with slight anterior subluxation at C5-6. This is unchanged since previous study and is likely degenerative. Normal alignment of the facet joints. C1-2  articulation appears intact. Skull base and vertebrae: Skull base appears intact. No vertebral compression deformities. No focal bone lesion or bone destruction. Soft tissues and spinal canal: No prevertebral fluid or swelling. No visible canal hematoma. Disc levels: Degenerative changes throughout the cervical spine with narrowed interspaces and endplate hypertrophic changes. Vertebral degenerative changes are most prominent at C1-2, C3-4, and C6-7 levels. 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 Upper chest: Emphysematous changes and fibrosis in the lung apices. Nodular scarring. Other: None. IMPRESSION: 1. No acute intracranial abnormalities. Mild cerebral atrophy and old lacunar infarcts in the deep white matter. 2. Unchanged alignment of the cervical spine since previous study. Prominent diffuse degenerative changes. No acute displaced fractures identified. 3. Emphysematous changes, fibrosis, and nodular scarring in the lung apices. Electronically Signed   By: Lucienne Capers M.D.   On: 11/13/2017 22:29      Subjective:   Discharge Exam: Vitals:   11/22/17 2331 11/23/17 0727  BP: (!) 89/65 109/65  Pulse: 78 74  Resp: 20 16  Temp: 98.2 F (36.8 C) 97.6 F (36.4 C)  SpO2: 98% 99%   Vitals:   11/22/17 1639 11/22/17 2229 11/22/17 2331 11/23/17 0727  BP: (!) 103/55 113/62 (!) 89/65 109/65  Pulse: 73 81 78 74  Resp:   20 16  Temp: 99 F (37.2 C)  98.2 F (36.8 C) 97.6 F (36.4 C)  TempSrc: Oral  Oral Oral  SpO2: 98%  98% 99%  Weight:      Height:        General exam: Appears calm and comfortable  Respiratory system: Clear to auscultation. Respiratory effort normal. Cardiovascular system: Regular rate and rhythm, no murmurs Gastrointestinal system: Abdomen is nondistended, soft and nontender. No organomegaly or masses felt. Normal bowel sounds heard. Central nervous system: Alert  oriented to person, place but not to time and only somewhat to situation.. No focal neurological deficits. Extremities: No lower extremity edema. Skin: Senile purpura Psychiatry: Judgement and insight appear normal. Mood & affect appropriate.       The results of significant diagnostics from this hospitalization (including imaging, microbiology, ancillary and laboratory) are listed below for reference.     Microbiology: No results found for this or any previous visit (from the past 240 hour(s)).   Labs: BNP (last 3 results) Recent Labs    05/15/17 2035 06/09/17 1855  BNP 831.6* 662.9*   Basic Metabolic Panel: Recent Labs  Lab 11/21/17 1015 11/23/17 0319  NA 134* 138  K 3.8 2.9*  CL 96* 97*  CO2 24 27  GLUCOSE 148* 107*  BUN 17 24*  CREATININE 0.78 0.90  CALCIUM 9.8 9.3  MG  --  1.9   Liver Function Tests: Recent Labs  Lab 11/21/17 1015 11/22/17 1242 11/23/17 0319  AST 26 18 18  ALT 16 14 13   ALKPHOS 89 73 79  BILITOT 0.5 0.6 0.5  PROT 8.1 7.0 6.5  ALBUMIN 4.1 3.3* 3.2*   No results for input(s): LIPASE, AMYLASE in the last 168 hours. Recent Labs  Lab 11/21/17 1213  AMMONIA <9*   CBC: Recent Labs  Lab 11/21/17 1015 11/23/17 0319  WBC 8.2 9.2  HGB 10.6* 9.6*  HCT 35.5* 31.1*  MCV 87.9 86.1  PLT 388 348   Cardiac Enzymes: No results for input(s): CKTOTAL, CKMB, CKMBINDEX, TROPONINI in the last 168 hours. BNP: Invalid input(s): POCBNP CBG: Recent Labs  Lab 11/22/17 0757 11/22/17 1159 11/22/17 1636 11/22/17 2147 11/23/17 0726  GLUCAP 105* 166* 98 120* 109*   D-Dimer No results for input(s): DDIMER in the last 72 hours. Hgb A1c No results for input(s): HGBA1C in the last 72 hours. Lipid Profile No results for input(s): CHOL, HDL, LDLCALC, TRIG, CHOLHDL, LDLDIRECT in the last 72 hours. Thyroid function studies No results for input(s): TSH, T4TOTAL, T3FREE, THYROIDAB in the last 72 hours.  Invalid input(s): FREET3 Anemia work up No  results for input(s): VITAMINB12, FOLATE, FERRITIN, TIBC, IRON, RETICCTPCT in the last 72 hours. Urinalysis    Component Value Date/Time   COLORURINE STRAW (A) 11/21/2017 1007   APPEARANCEUR CLEAR 11/21/2017 1007   LABSPEC 1.015 11/21/2017 1007   PHURINE 7.0 11/21/2017 1007   GLUCOSEU 50 (A) 11/21/2017 1007   HGBUR NEGATIVE 11/21/2017 1007   BILIRUBINUR NEGATIVE 11/21/2017 1007   KETONESUR NEGATIVE 11/21/2017 1007   PROTEINUR NEGATIVE 11/21/2017 1007   NITRITE NEGATIVE 11/21/2017 1007   LEUKOCYTESUR NEGATIVE 11/21/2017 1007   Sepsis Labs Invalid input(s): PROCALCITONIN,  WBC,  LACTICIDVEN Microbiology No results found for this or any previous visit (from the past 240 hour(s)).   Time coordinating discharge: Over 30 minutes  SIGNED:   Cristy Folks, MD  [facility] 11/23/2017, 10:11 AM   If 7PM-7AM, please contact night-coverage [email or web address] Password [email or web address]

## 2017-11-23 NOTE — Discharge Instructions (Signed)
Acetaminophen tablets or caplets °What is this medicine? °ACETAMINOPHEN (a set a MEE noe fen) is a pain reliever. It is used to treat mild pain and fever. °This medicine may be used for other purposes; ask your health care provider or pharmacist if you have questions. °COMMON BRAND NAME(S): Aceta, Actamin, Anacin Aspirin Free, Genapap, Genebs, Mapap, Pain & Fever, Pain and Fever, PAIN RELIEF, PAIN RELIEF Extra Strength, Pain Reliever, Panadol, PHARBETOL, Q-Pap, Q-Pap Extra Strength, Tylenol, Tylenol CrushableTablet, Tylenol Extra Strength, XS No Aspirin, XS Pain Reliever °What should I tell my health care provider before I take this medicine? °They need to know if you have any of these conditions: °-if you often drink alcohol °-liver disease °-an unusual or allergic reaction to acetaminophen, other medicines, foods, dyes, or preservatives °-pregnant or trying to get pregnant °-breast-feeding °How should I use this medicine? °Take this medicine by mouth with a glass of water. Follow the directions on the package or prescription label. Take your medicine at regular intervals. Do not take your medicine more often than directed. °Talk to your pediatrician regarding the use of this medicine in children. While this drug may be prescribed for children as young as 6 years of age for selected conditions, precautions do apply. °Overdosage: If you think you have taken too much of this medicine contact a poison control center or emergency room at once. °NOTE: This medicine is only for you. Do not share this medicine with others. °What if I miss a dose? °If you miss a dose, take it as soon as you can. If it is almost time for your next dose, take only that dose. Do not take double or extra doses. °What may interact with this medicine? °-alcohol °-imatinib °-isoniazid °-other medicines with acetaminophen °This list may not describe all possible interactions. Give your health care provider a list of all the medicines, herbs,  non-prescription drugs, or dietary supplements you use. Also tell them if you smoke, drink alcohol, or use illegal drugs. Some items may interact with your medicine. °What should I watch for while using this medicine? °Tell your doctor or health care professional if the pain lasts more than 10 days (5 days for children), if it gets worse, or if there is a new or different kind of pain. Also, check with your doctor if a fever lasts for more than 3 days. °Do not take other medicines that contain acetaminophen with this medicine. Always read labels carefully. If you have questions, ask your doctor or pharmacist. °If you take too much acetaminophen get medical help right away. Too much acetaminophen can be very dangerous and cause liver damage. Even if you do not have symptoms, it is important to get help right away. °What side effects may I notice from receiving this medicine? °Side effects that you should report to your doctor or health care professional as soon as possible: °-allergic reactions like skin rash, itching or hives, swelling of the face, lips, or tongue °-breathing problems °-fever or sore throat °-redness, blistering, peeling or loosening of the skin, including inside the mouth °-trouble passing urine or change in the amount of urine °-unusual bleeding or bruising °-unusually weak or tired °-yellowing of the eyes or skin °Side effects that usually do not require medical attention (report to your doctor or health care professional if they continue or are bothersome): °-headache °-nausea, stomach upset °This list may not describe all possible side effects. Call your doctor for medical advice about side effects. You may report side effects   to FDA at 1-800-FDA-1088. °Where should I keep my medicine? °Keep out of reach of children. °Store at room temperature between 20 and 25 degrees C (68 and 77 degrees F). Protect from moisture and heat. Throw away any unused medicine after the expiration date. °NOTE: This  sheet is a summary. It may not cover all possible information. If you have questions about this medicine, talk to your doctor, pharmacist, or health care provider. °© 2018 Elsevier/Gold Standard (2012-12-02 12:54:16) ° °

## 2017-11-23 NOTE — Progress Notes (Signed)
CSW spoke with patient's son, [name] to provide update on discharge plan for Saturday. He requests [facility] take patient to [facility].  Percell Locus Arika Mainer LCSW [phone]

## 2017-11-23 NOTE — Care Management Important Message (Signed)
Important Message  Patient Details  Name: Angela Hines MRN: [id number] Date of Birth: [birth year]   Medicare Important Message Given:  Yes    Anapaola Kinsel 11/23/2017, 2:10 PM

## 2017-11-24 LAB — BASIC METABOLIC PANEL
Anion gap: 7 (ref 5–15)
BUN: 20 mg/dL (ref 8–23)
CHLORIDE: 105 mmol/L (ref 98–111)
CO2: 26 mmol/L (ref 22–32)
CREATININE: 0.71 mg/dL (ref 0.44–1.00)
Calcium: 9.2 mg/dL (ref 8.9–10.3)
GFR calc Af Amer: >60 mL/min (ref >60)
GFR calc non Af Amer: >60 mL/min (ref >60)
Glucose, Bld: 88 mg/dL (ref 70–99)
Potassium: 4.1 mmol/L (ref 3.5–5.1)
Sodium: 138 mmol/L (ref 135–145)

## 2017-11-24 LAB — GLUCOSE, CAPILLARY
Glucose-Capillary: 106 mg/dL — ABNORMAL HIGH (ref 70–99)
Glucose-Capillary: 108 mg/dL — ABNORMAL HIGH (ref 70–99)
Glucose-Capillary: 96 mg/dL (ref 70–99)

## 2017-11-24 NOTE — Progress Notes (Addendum)
Clinical Social Worker facilitated patient discharge including contacting patient family and facility to confirm patient discharge plans.  Clinical information faxed to facility and family agreeable with plan.  CSW arranged ambulance transport via [facility] to [facility], facility requested that patient to be picked up by [facility] no earlier than 6pm  .  RN to call [phone] for report prior to discharge.  Clinical Social Worker will sign off for now as social work intervention is no longer needed. Please consult [place] again if new need arises.  Rhea Pink, MSW, [place]

## 2017-11-28 ENCOUNTER — Encounter: Payer: Self-pay | Admitting: Internal Medicine

## 2017-11-29 LAB — CUP PACEART REMOTE DEVICE CHECK
Date Time Interrogation Session: 20190625020517
MDC IDC PG IMPLANT DT: 20190501

## 2017-12-14 ENCOUNTER — Ambulatory Visit (INDEPENDENT_AMBULATORY_CARE_PROVIDER_SITE_OTHER): Payer: Medicare Other | Admitting: Cardiology

## 2017-12-14 ENCOUNTER — Encounter: Payer: Self-pay | Admitting: Cardiology

## 2017-12-14 VITALS — BP 148/78 | HR 78 | Ht 67.0 in | Wt 109.0 lb

## 2017-12-14 DIAGNOSIS — I5022 Chronic systolic (congestive) heart failure: Secondary | ICD-10-CM

## 2017-12-14 DIAGNOSIS — R296 Repeated falls: Secondary | ICD-10-CM | POA: Diagnosis not present

## 2017-12-14 DIAGNOSIS — Z952 Presence of prosthetic heart valve: Secondary | ICD-10-CM | POA: Diagnosis not present

## 2017-12-14 DIAGNOSIS — I10 Essential (primary) hypertension: Secondary | ICD-10-CM

## 2017-12-14 DIAGNOSIS — I480 Paroxysmal atrial fibrillation: Secondary | ICD-10-CM | POA: Diagnosis not present

## 2017-12-14 NOTE — Progress Notes (Signed)
Cardiology Office Note:    Date:  12/14/2017   ID:  Angela Hines, DOB [birth year], MRN [id number]  PCP:  Kelton Pillar, MD  Cardiologist:  Jenkins Rouge, MD  Referring MD: Kelton Pillar, MD   Chief Complaint  Patient presents with  . Follow-up    History of Present Illness:    Angela Hines is a 84 y.o. female with a past medical history significant for paroxysmal atrial fibrillation on Sotalol and Xarelto, CHF, bioprosthetic AVR 2005, COPD, diabetes type 2, hypertension and hyperlipidemia. Angela Hines relocated from the [place] area and establish care with Dr. Johnsie Cancel in 01/2017.  She has a history of PAF with TEE/DCC April 2016 with loop recorder placed for syncope, replaced in 08/2017. PAF is treated with sotalol and coumadin. She had a 21 mm Edwards Pericardial 2700 series bioprosthetic AVR by Dr Evelina Dun in September 2005.  Angela Hines has been having recent multiple falls.  She lives at [facility] independent living where she gets physical therapy and her medications given to her. She was seen in the emergency department again on 11/13/2017 after a fall which was felt to be due to loss of balance and was not preceded by any dizziness.  He did hit her head but there was no loss of consciousness.  CT of the head and neck were negative.  He was also seen in May after a fall and was found to be dehydrated.   I saw her on 10/23/2017, at which time her blood pressure was elevated and a low-dose of amlodipine was added.  She has a prior allergy to ACE inhibitor.  She did not appear to be volume overloaded.  I decreased her Lasix at that time.  Her A. fib has been well controlled with metoprolol 25 mg twice daily.  Appears that her amiodarone was discontinued on 11/21/2017 She is on Xarelto 15 mg daily for stroke risk reduction. Loop recorder download 11/29/2017 showed no A. fib episodes.  She was admitted to the hospital on 11/23/2017 with an apparent accidental acetaminophen overdose.  Her  liver function tests were stable. She was in the hospital for a week and then rehab for a week. She is having home PT 5 days per week as well as OT. She feels that she is getting stronger.   She is here today with her daughter in law. She has No swelling or increase in SOB. No chest pain/pressure. Home BP's 150's/80's. Takes Ativan at least twice a day and BP comes down into the 110's after Ativan.  BP today is 148/78 but she has not had her meds yet today.     Past Medical History:  Diagnosis Date  . Atrial fibrillation ([place])   . CHF (congestive heart failure) ([place])   . COPD (chronic obstructive pulmonary disease) ([place])   . Diabetes mellitus without complication ([place])   . High cholesterol   . Hypertension     Past Surgical History:  Procedure Laterality Date  . CARDIAC PACEMAKER PLACEMENT  07/30/2014  . 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 CARDIOVERSION N/A 06/15/2017   Procedure: CARDIOVERSION;  Surgeon: Lelon Perla, MD;  Location: [facility] ENDOSCOPY;  Service: Cardiovascular;  Laterality: N/A;  . CHOLECYSTECTOMY    . EXCISIONAL HEMORRHOIDECTOMY    . LOOP RECORDER INSERTION N/A 08/22/2017   Procedure: LOOP RECORDER INSERTION;  Surgeon: [name], MD;  Location: [place] [facility] CV LAB;  Service: Cardiovascular;  Laterality: N/A;  . LOOP RECORDER  REMOVAL N/A 08/22/2017   Procedure: LOOP RECORDER REMOVAL;  Surgeon: [name], MD;  Location: [place] CV LAB;  Service: Cardiovascular;  Laterality: N/A;    Current Medications: Current Meds  Medication Sig  . albuterol (PROVENTIL HFA;VENTOLIN HFA) 108 (90 Base) MCG/ACT inhaler Inhale 2 puffs into the lungs every 6 (six) hours as needed for shortness of breath.  Marland Kitchen amiodarone (PACERONE) 100 MG tablet Take 100 mg by mouth daily.  Marland Kitchen amLODipine (NORVASC) 2.5 MG tablet Take 1 tablet (2.5 mg total) by mouth daily.  . bisacodyl (DULCOLAX) 5 MG EC tablet Take 5 mg by mouth as needed for moderate constipation.  . bismuth  subsalicylate (PEPTO BISMOL) 262 MG/15ML suspension Take 30 mLs by mouth every 6 (six) hours as needed for indigestion.  Marland Kitchen buPROPion (WELLBUTRIN XL) 150 MG 24 hr tablet Take 150 mg by mouth daily.  . feeding supplement, ENSURE ENLIVE, (ENSURE ENLIVE) LIQD Take 237 mLs by mouth 3 (three) times daily between meals. (Patient taking differently: Take 237 mLs by mouth daily. )  . fluticasone (FLONASE) 50 MCG/ACT nasal spray Place 1 spray into both nostrils as needed for allergies or rhinitis.  . furosemide (LASIX) 20 MG tablet Take 1 tablet (20 mg total) by mouth daily.  Marland Kitchen gabapentin (NEURONTIN) 300 MG capsule Take 600 mg by mouth at bedtime.   Marland Kitchen LORazepam (ATIVAN) 0.5 MG tablet Take 1 tablet (0.5 mg total) by mouth 3 (three) times daily as needed for anxiety.  . magnesium oxide (MAG-OX) 400 (241.3 Mg) MG tablet Take 1 tablet (400 mg total) by mouth daily.  . metFORMIN (GLUCOPHAGE) 1000 MG tablet Take 1,000 mg by mouth daily.  . metoprolol tartrate (LOPRESSOR) 25 MG tablet Take 25 mg by mouth 2 (two) times daily.  Marland Kitchen omeprazole (PRILOSEC) 40 MG capsule Take 40 mg by mouth daily.   Marland Kitchen oxymetazoline (AFRIN) 0.05 % nasal spray Place 1 spray into both nostrils as needed for congestion.  . polyethylene glycol (MIRALAX / GLYCOLAX) packet Take 17 g by mouth daily as needed for mild constipation.  . Rivaroxaban (XARELTO) 15 MG TABS tablet Take 15 mg by mouth daily with supper.     Allergies:   Clonidine; Captopril; Codeine; and Ceclor [cefaclor]   Social History   Socioeconomic History  . Marital status: Single    Spouse name: Not on file  . Number of children: Not on file  . Years of education: Not on file  . Highest education level: Not on file  Occupational History  . Not on file  Social Needs  . Financial resource strain: Not on file  . Food insecurity:    Worry: Not on file    Inability: Not on file  . Transportation needs:    Medical: Not on file    Non-medical: Not on file  Tobacco Use    . Smoking status: Never Smoker  . Smokeless tobacco: Never Used  Substance and Sexual Activity  . Alcohol use: No  . Drug use: No  . Sexual activity: Never  Lifestyle  . Physical activity:    Days per week: Not on file    Minutes per session: Not on file  . Stress: Not on file  Relationships  . Social connections:    Talks on phone: Not on file    Gets together: Not on file    Attends religious service: Not on file    Active member of club or organization: Not on file    Attends meetings of clubs  or organizations: Not on file    Relationship status: Not on file  Other Topics Concern  . Not on file  Social History Narrative  . Not on file     Family History: The patient's family history includes Diabetes in her son; Emphysema in her father; Heart attack in her son; Heart disease in her son; Hypertension in her brother, brother, and sister; Lung disease in her sister; Multiple myeloma in her son; Other in her son; Stroke in her brother. ROS:   Please see the history of present illness.     All other systems reviewed and are negative.  EKGs/Labs/Other Studies Reviewed:    The following studies were reviewed today:  Echo 06/11/17 Study Conclusions - Left ventricle: The cavity size was normal. There was moderate   concentric hypertrophy. Systolic function was mildly to   moderately reduced. The estimated ejection fraction was in the   range of 40% to 45%. Diffuse hypokinesis. - Aortic valve: A bioprosthetic valve is present in the aortic   position. There was moderate stenosis. Valve area (VTI): 0.31   cm^2. Valve area (Vmax): 0.39 cm^2. Valve area (Vmean): 0.39   cm^2. - Mitral valve: There was moderate regurgitation directed   posteriorly. - Left atrium: The atrium was moderately dilated. - Right ventricle: Systolic function was moderately reduced. - Tricuspid valve: There was moderate regurgitation. - Pulmonary arteries: Systolic pressure was mildly increased. PA    peak pressure: 33 mm Hg (S). - Inferior vena cava: The vessel was dilated. The respirophasic   diameter changes were blunted (< 50%), consistent with elevated   central venous pressure.  Impressions: - No prior echocardiogram is available for comparison.   LVEF is mildly to moderately decrased, LVEF 40-45%.   RVEF is moderately decreased.   Velocities and gradients across the bioprosthetic aortic valve   are elevated (2.9 m/sec, mean gradient 21 mmHg).   Moderate mitral and tricuspid regurgitation.   EKG:  EKG is not ordered today.    Recent Labs: 05/22/2017: TSH 0.659 06/09/2017: B Natriuretic Peptide 626.8 11/23/2017: ALT 13; Hemoglobin 9.6; Magnesium 1.9; Platelets 348 11/24/2017: BUN 20; Creatinine, Ser 0.71; Potassium 4.1; Sodium 138   Recent Lipid Panel No results found for: CHOL, TRIG, HDL, CHOLHDL, VLDL, LDLCALC, LDLDIRECT  Physical Exam:    VS:  BP (!) 148/78   Pulse 78   Ht 5' 7"  (1.702 m)   Wt 109 lb (49.4 kg)   SpO2 94%   BMI 17.07 kg/m     Wt Readings from Last 3 Encounters:  12/14/17 109 lb (49.4 kg)  11/21/17 100 lb 4.9 oz (45.5 kg)  10/23/17 110 lb 12.8 oz (50.3 kg)     Physical Exam  Constitutional: She is oriented to person, place, and time. She appears well-developed and well-nourished. No distress.  HENT:  Head: Normocephalic and atraumatic.  Neck: Normal range of motion. Neck supple. No JVD present.  Cardiovascular: Normal rate, regular rhythm and intact distal pulses. Exam reveals no gallop and no friction rub.  Murmur heard.  Systolic murmur is present with a grade of 3/6 at the upper right sternal border radiating to the neck. Pulmonary/Chest: Effort normal and breath sounds normal. No respiratory distress. She has no wheezes. She has no rales.  Abdominal: Soft. Bowel sounds are normal.  Musculoskeletal: Normal range of motion. She exhibits no edema.  Neurological: She is alert and oriented to person, place, and time.  Skin: Skin is warm and  dry.  Psychiatric: She has  a normal mood and affect. Her behavior is normal. Thought content normal.  Vitals reviewed.    ASSESSMENT:    1. Essential hypertension   2. AF (paroxysmal atrial fibrillation) ([place])   3. Falls frequently   4. S/P AVR (aortic valve replacement)   5. Chronic systolic CHF (congestive heart failure) ([facility])    PLAN:    In order of problems listed above:  Hypertension: Recent addition of low dose amlodipine. BP still slightly elevated but she has not had her meds yet today. Her BP comes down after her Ativan which she takes at least twice a day. With her history of frequent falls, I am not going to increase her BP meds at this time.   Paroxysmal atrial fibrillation: Maintaining sinus rhythm on amiodarone and low-dose metoprolol. No afib recently on loop recorder, last interrogation was 11/29/17. The patient is anticoagulated with Xarelto. With her repeated falls, we may have to consider the risks and benefits of continuing anticoagulation. I briefly brought this up tot he patient and her family. We will continue A/C for stroke risk reduction for now.   Frequent falls: Her falls seem to be related to tripping, poor balance. She has been to rehab and continues to get home PT and OT for increased strength and balance.    AVR: Bioprosthetic aortic valve replacement in 2005.  Noted to have elevated velocities and gradients across the bioprosthetic 05/2017.  Advised follow-up echo in 1 year by Dr. Johnsie Cancel. No chest pain, heart failure symptoms or syncope. (her fall does not appear to be related to syncope)  Chronic systolic heart failure: Doing well on decreased lasix. Appears euvolemic.     Medication Adjustments/Labs and Tests Ordered: Current medicines are reviewed at length with the patient today.  Concerns regarding medicines are outlined above. Labs and tests ordered and medication changes are outlined in the patient instructions below:  Patient Instructions    Medication Instructions: Your physician recommends that you continue on your current medications as directed. Please refer to the Current Medication list given to you today.'  Labwork: None  Procedures/Testing: None  Follow-Up: Keep follow up appointment with Dr.Nishan on 12/27/17 @ 8:45 AM  Any Additional Special Instructions Will Be Listed Below (If Applicable).   It is important to avoid accidents which may result in broken bones.  Here are a few ideas on how to make your home safer so you will be less likely to trip or fall.  1. Use nonskid mats or non slip strips in your shower or tub, on your bathroom floor and around sinks.  If you know that you have spilled water, wipe it up! 2. In the bathroom, it is important to have properly installed grab bars on the walls or on the edge of the tub.  Towel racks are NOT strong enough for you to hold onto or to pull on for support. 3. Stairs and hallways should have enough light.  Add lamps or night lights if you need ore light. 4. It is good to have handrails on both sides of the stairs if possible.  Always fix broken handrails right away. 5. It is important to see the edges of steps.  Paint the edges of outdoor steps white so you can see them better.  Put colored tape on the edge of inside steps. 6. Throw-rugs are dangerous because they can slide.  Removing the rugs is the best idea, but if they must stay, add adhesive carpet tape to prevent slipping. 7. Do  not keep things on stairs or in the halls.  Remove small furniture that blocks the halls as it may cause you to trip.  Keep telephone and electrical cords out of the way where you walk. 8. Always were sturdy, rubber-soled shoes for good support.  Never wear just socks, especially on the stairs.  Socks may cause you to slip or fall.  Do not wear full-length housecoats as you can easily trip on the bottom.  9. Place the things you use the most on the shelves that are the easiest to reach.  If  you use a stepstool, make sure it is in good condition.  If you feel unsteady, DO NOT climb, ask for help. 10. If a health professional advises you to use a cane or walker, do not be ashamed.  These items can keep you from falling and breaking your bones.    If you need a refill on your cardiac medications before your next appointment, please call your pharmacy. '    Signed, Daune Perch, NP  12/17/2017 4:28 AM    [place]

## 2017-12-14 NOTE — Patient Instructions (Signed)
Medication Instructions: Your physician recommends that you continue on your current medications as directed. Please refer to the Current Medication list given to you today.'  Labwork: None  Procedures/Testing: None  Follow-Up: Keep follow up appointment with Dr.Nishan on 12/27/17 @ 8:45 AM  Any Additional Special Instructions Will Be Listed Below (If Applicable).   It is important to avoid accidents which may result in broken bones.  Here are a few ideas on how to make your home safer so you will be less likely to trip or fall.  1. Use nonskid mats or non slip strips in your shower or tub, on your bathroom floor and around sinks.  If you know that you have spilled water, wipe it up! 2. In the bathroom, it is important to have properly installed grab bars on the walls or on the edge of the tub.  Towel racks are NOT strong enough for you to hold onto or to pull on for support. 3. Stairs and hallways should have enough light.  Add lamps or night lights if you need ore light. 4. It is good to have handrails on both sides of the stairs if possible.  Always fix broken handrails right away. 5. It is important to see the edges of steps.  Paint the edges of outdoor steps white so you can see them better.  Put colored tape on the edge of inside steps. 6. Throw-rugs are dangerous because they can slide.  Removing the rugs is the best idea, but if they must stay, add adhesive carpet tape to prevent slipping. 7. Do not keep things on stairs or in the halls.  Remove small furniture that blocks the halls as it may cause you to trip.  Keep telephone and electrical cords out of the way where you walk. 8. Always were sturdy, rubber-soled shoes for good support.  Never wear just socks, especially on the stairs.  Socks may cause you to slip or fall.  Do not wear full-length housecoats as you can easily trip on the bottom.  9. Place the things you use the most on the shelves that are the easiest to reach.  If you  use a stepstool, make sure it is in good condition.  If you feel unsteady, DO NOT climb, ask for help. 10. If a health professional advises you to use a cane or walker, do not be ashamed.  These items can keep you from falling and breaking your bones.    If you need a refill on your cardiac medications before your next appointment, please call your pharmacy. '

## 2017-12-17 ENCOUNTER — Emergency Department (HOSPITAL_COMMUNITY): Payer: Medicare Other

## 2017-12-17 ENCOUNTER — Inpatient Hospital Stay (HOSPITAL_COMMUNITY)
Admission: EM | Admit: 2017-12-17 | Discharge: 2017-12-20 | DRG: 391 | Disposition: A | Discharge status: Home Health | Payer: Medicare Other | Attending: Internal Medicine | Admitting: Internal Medicine

## 2017-12-17 ENCOUNTER — Encounter (HOSPITAL_COMMUNITY): Payer: Self-pay | Admitting: Emergency Medicine

## 2017-12-17 DIAGNOSIS — E119 Type 2 diabetes mellitus without complications: Secondary | ICD-10-CM

## 2017-12-17 DIAGNOSIS — Z807 Family history of other malignant neoplasms of lymphoid, hematopoietic and related tissues: Secondary | ICD-10-CM

## 2017-12-17 DIAGNOSIS — E43 Unspecified severe protein-calorie malnutrition: Secondary | ICD-10-CM | POA: Diagnosis present

## 2017-12-17 DIAGNOSIS — F418 Other specified anxiety disorders: Secondary | ICD-10-CM | POA: Diagnosis present

## 2017-12-17 DIAGNOSIS — D649 Anemia, unspecified: Secondary | ICD-10-CM | POA: Diagnosis present

## 2017-12-17 DIAGNOSIS — Z7984 Long term (current) use of oral hypoglycemic drugs: Secondary | ICD-10-CM

## 2017-12-17 DIAGNOSIS — Z833 Family history of diabetes mellitus: Secondary | ICD-10-CM | POA: Diagnosis not present

## 2017-12-17 DIAGNOSIS — K529 Noninfective gastroenteritis and colitis, unspecified: Principal | ICD-10-CM | POA: Diagnosis present

## 2017-12-17 DIAGNOSIS — Z7901 Long term (current) use of anticoagulants: Secondary | ICD-10-CM | POA: Diagnosis not present

## 2017-12-17 DIAGNOSIS — Z66 Do not resuscitate: Secondary | ICD-10-CM | POA: Diagnosis present

## 2017-12-17 DIAGNOSIS — N390 Urinary tract infection, site not specified: Secondary | ICD-10-CM | POA: Diagnosis present

## 2017-12-17 DIAGNOSIS — K219 Gastro-esophageal reflux disease without esophagitis: Secondary | ICD-10-CM | POA: Diagnosis present

## 2017-12-17 DIAGNOSIS — Z8249 Family history of ischemic heart disease and other diseases of the circulatory system: Secondary | ICD-10-CM

## 2017-12-17 DIAGNOSIS — E785 Hyperlipidemia, unspecified: Secondary | ICD-10-CM | POA: Diagnosis present

## 2017-12-17 DIAGNOSIS — Z952 Presence of prosthetic heart valve: Secondary | ICD-10-CM | POA: Diagnosis not present

## 2017-12-17 DIAGNOSIS — I10 Essential (primary) hypertension: Secondary | ICD-10-CM | POA: Diagnosis not present

## 2017-12-17 DIAGNOSIS — Z823 Family history of stroke: Secondary | ICD-10-CM

## 2017-12-17 DIAGNOSIS — Z9049 Acquired absence of other specified parts of digestive tract: Secondary | ICD-10-CM

## 2017-12-17 DIAGNOSIS — R112 Nausea with vomiting, unspecified: Secondary | ICD-10-CM | POA: Diagnosis present

## 2017-12-17 DIAGNOSIS — E7800 Pure hypercholesterolemia, unspecified: Secondary | ICD-10-CM | POA: Diagnosis present

## 2017-12-17 DIAGNOSIS — Z888 Allergy status to other drugs, medicaments and biological substances status: Secondary | ICD-10-CM

## 2017-12-17 DIAGNOSIS — J449 Chronic obstructive pulmonary disease, unspecified: Secondary | ICD-10-CM | POA: Diagnosis present

## 2017-12-17 DIAGNOSIS — I110 Hypertensive heart disease with heart failure: Secondary | ICD-10-CM | POA: Diagnosis present

## 2017-12-17 DIAGNOSIS — I5022 Chronic systolic (congestive) heart failure: Secondary | ICD-10-CM | POA: Diagnosis present

## 2017-12-17 DIAGNOSIS — J418 Mixed simple and mucopurulent chronic bronchitis: Secondary | ICD-10-CM | POA: Diagnosis not present

## 2017-12-17 DIAGNOSIS — Z950 Presence of cardiac pacemaker: Secondary | ICD-10-CM | POA: Diagnosis not present

## 2017-12-17 DIAGNOSIS — F0390 Unspecified dementia without behavioral disturbance: Secondary | ICD-10-CM | POA: Diagnosis present

## 2017-12-17 DIAGNOSIS — N3000 Acute cystitis without hematuria: Secondary | ICD-10-CM | POA: Diagnosis not present

## 2017-12-17 DIAGNOSIS — E876 Hypokalemia: Secondary | ICD-10-CM | POA: Diagnosis not present

## 2017-12-17 DIAGNOSIS — Z885 Allergy status to narcotic agent status: Secondary | ICD-10-CM

## 2017-12-17 DIAGNOSIS — Z825 Family history of asthma and other chronic lower respiratory diseases: Secondary | ICD-10-CM

## 2017-12-17 DIAGNOSIS — E860 Dehydration: Secondary | ICD-10-CM | POA: Diagnosis present

## 2017-12-17 DIAGNOSIS — K561 Intussusception: Secondary | ICD-10-CM | POA: Diagnosis not present

## 2017-12-17 DIAGNOSIS — I480 Paroxysmal atrial fibrillation: Secondary | ICD-10-CM

## 2017-12-17 DIAGNOSIS — Z881 Allergy status to other antibiotic agents status: Secondary | ICD-10-CM

## 2017-12-17 LAB — CBC WITH DIFFERENTIAL/PLATELET
Basophils Absolute: 0 10*3/uL (ref 0.0–0.1)
Basophils Relative: 0 %
EOS ABS: 0 10*3/uL (ref 0.0–0.7)
Eosinophils Relative: 0 %
HEMATOCRIT: 32.9 % — AB (ref 36.0–46.0)
HEMOGLOBIN: 10 g/dL — AB (ref 12.0–15.0)
LYMPHS ABS: 2.6 10*3/uL (ref 0.7–4.0)
Lymphocytes Relative: 27 %
MCH: 25.8 pg — AB (ref 26.0–34.0)
MCHC: 30.4 g/dL (ref 30.0–36.0)
MCV: 85 fL (ref 78.0–100.0)
Monocytes Absolute: 0.7 10*3/uL (ref 0.1–1.0)
Monocytes Relative: 8 %
NEUTROS ABS: 6.3 10*3/uL (ref 1.7–7.7)
NEUTROS PCT: 65 %
Platelets: 452 10*3/uL — ABNORMAL HIGH (ref 150–400)
RBC: 3.87 MIL/uL (ref 3.87–5.11)
RDW: 15.7 % — ABNORMAL HIGH (ref 11.5–15.5)
WBC: 9.6 10*3/uL (ref 4.0–10.5)

## 2017-12-17 LAB — COMPREHENSIVE METABOLIC PANEL
ALBUMIN: 4 g/dL (ref 3.5–5.0)
ALK PHOS: 88 U/L (ref 38–126)
ALT: 15 U/L (ref 0–44)
AST: 26 U/L (ref 15–41)
Anion gap: 14 (ref 5–15)
BILIRUBIN TOTAL: 0.5 mg/dL (ref 0.3–1.2)
BUN: 20 mg/dL (ref 8–23)
CALCIUM: 9.6 mg/dL (ref 8.9–10.3)
CO2: 27 mmol/L (ref 22–32)
CREATININE: 0.7 mg/dL (ref 0.44–1.00)
Chloride: 97 mmol/L — ABNORMAL LOW (ref 98–111)
GFR calc Af Amer: >60 mL/min (ref >60)
GFR calc non Af Amer: >60 mL/min (ref >60)
GLUCOSE: 90 mg/dL (ref 70–99)
Potassium: 3.1 mmol/L — ABNORMAL LOW (ref 3.5–5.1)
Sodium: 138 mmol/L (ref 135–145)
Total Protein: 7.7 g/dL (ref 6.5–8.1)

## 2017-12-17 LAB — LIPASE, BLOOD: Lipase: 26 U/L (ref 11–51)

## 2017-12-17 LAB — URINALYSIS, ROUTINE W REFLEX MICROSCOPIC
Bilirubin Urine: NEGATIVE
Glucose, UA: NEGATIVE mg/dL
KETONES UR: NEGATIVE mg/dL
Nitrite: NEGATIVE
PROTEIN: NEGATIVE mg/dL
Specific Gravity, Urine: 1.01 (ref 1.005–1.030)
pH: 7 (ref 5.0–8.0)

## 2017-12-17 LAB — I-STAT TROPONIN, ED: Troponin i, poc: 0 ng/mL (ref 0.00–0.08)

## 2017-12-17 LAB — CBG MONITORING, ED: GLUCOSE-CAPILLARY: 98 mg/dL (ref 70–99)

## 2017-12-17 MED ORDER — ZOLPIDEM TARTRATE 5 MG PO TABS
5.0000 mg | ORAL_TABLET | Freq: Every evening | ORAL | Status: DC | PRN
  Administered 2017-12-18 – 2017-12-19 (×3): 5 mg via ORAL
  Filled 2017-12-17 (×3): qty 1

## 2017-12-17 MED ORDER — IOHEXOL 300 MG/ML  SOLN
30.0000 mL | Freq: Once | INTRAMUSCULAR | Status: AC | PRN
  Administered 2017-12-17: 30 mL via ORAL

## 2017-12-17 MED ORDER — GABAPENTIN 300 MG PO CAPS
600.0000 mg | ORAL_CAPSULE | Freq: Every day | ORAL | Status: DC
  Administered 2017-12-18 – 2017-12-19 (×3): 600 mg via ORAL
  Filled 2017-12-17 (×3): qty 2

## 2017-12-17 MED ORDER — IOPAMIDOL (ISOVUE-300) INJECTION 61%
INTRAVENOUS | Status: AC
  Administered 2017-12-17 (×2)
  Filled 2017-12-17: qty 100

## 2017-12-17 MED ORDER — INSULIN ASPART 100 UNIT/ML ~~LOC~~ SOLN
0.0000 [IU] | Freq: Three times a day (TID) | SUBCUTANEOUS | Status: DC
  Administered 2017-12-19: 2 [IU] via SUBCUTANEOUS

## 2017-12-17 MED ORDER — BISMUTH SUBSALICYLATE 262 MG/15ML PO SUSP
30.0000 mL | Freq: Four times a day (QID) | ORAL | Status: DC | PRN
  Filled 2017-12-17: qty 236

## 2017-12-17 MED ORDER — ONDANSETRON HCL 4 MG/2ML IJ SOLN: 4.0000 mg | Freq: Three times a day (TID) | INTRAMUSCULAR | Status: DC | PRN

## 2017-12-17 MED ORDER — BUPROPION HCL ER (XL) 150 MG PO TB24
150.0000 mg | ORAL_TABLET | Freq: Every day | ORAL | Status: DC
  Administered 2017-12-18 – 2017-12-20 (×3): 150 mg via ORAL
  Filled 2017-12-17 (×3): qty 1

## 2017-12-17 MED ORDER — LORAZEPAM 1 MG PO TABS
1.0000 mg | ORAL_TABLET | Freq: Every day | ORAL | Status: DC
  Administered 2017-12-18 – 2017-12-19 (×3): 1 mg via ORAL
  Filled 2017-12-17 (×3): qty 1

## 2017-12-17 MED ORDER — AMLODIPINE BESYLATE 2.5 MG PO TABS
2.5000 mg | ORAL_TABLET | Freq: Every day | ORAL | Status: DC
  Administered 2017-12-18 – 2017-12-20 (×3): 2.5 mg via ORAL
  Filled 2017-12-17 (×3): qty 1

## 2017-12-17 MED ORDER — MAGNESIUM OXIDE 400 (241.3 MG) MG PO TABS
400.0000 mg | ORAL_TABLET | Freq: Every day | ORAL | Status: DC
  Administered 2017-12-18 – 2017-12-20 (×4): 400 mg via ORAL
  Filled 2017-12-17 (×4): qty 1

## 2017-12-17 MED ORDER — IOPAMIDOL (ISOVUE-300) INJECTION 61%
100.0000 mL | Freq: Once | INTRAVENOUS | Status: AC | PRN
  Administered 2017-12-17: 100 mL via INTRAVENOUS

## 2017-12-17 MED ORDER — ONDANSETRON HCL 4 MG/2ML IJ SOLN
4.0000 mg | Freq: Once | INTRAMUSCULAR | Status: AC
  Administered 2017-12-17: 4 mg via INTRAVENOUS
  Filled 2017-12-17: qty 2

## 2017-12-17 MED ORDER — INSULIN ASPART 100 UNIT/ML ~~LOC~~ SOLN: 0.0000 [IU] | Freq: Every day | SUBCUTANEOUS | Status: DC

## 2017-12-17 MED ORDER — IBUPROFEN 200 MG PO TABS
200.0000 mg | ORAL_TABLET | Freq: Every day | ORAL | Status: DC | PRN
  Administered 2017-12-18 – 2017-12-20 (×3): 200 mg via ORAL
  Filled 2017-12-17 (×3): qty 1

## 2017-12-17 MED ORDER — MORPHINE SULFATE (PF) 4 MG/ML IV SOLN: 4.0000 mg | Freq: Once | INTRAVENOUS | Status: DC

## 2017-12-17 MED ORDER — SODIUM CHLORIDE 0.9 % IV SOLN
2.0000 g | Freq: Once | INTRAVENOUS | Status: AC
  Administered 2017-12-18: 2 g via INTRAVENOUS
  Filled 2017-12-17: qty 2

## 2017-12-17 MED ORDER — METOPROLOL TARTRATE 25 MG PO TABS
25.0000 mg | ORAL_TABLET | Freq: Two times a day (BID) | ORAL | Status: DC
  Administered 2017-12-18 – 2017-12-20 (×6): 25 mg via ORAL
  Filled 2017-12-17 (×6): qty 1

## 2017-12-17 MED ORDER — PANTOPRAZOLE SODIUM 40 MG PO TBEC
40.0000 mg | DELAYED_RELEASE_TABLET | Freq: Every day | ORAL | Status: DC
  Administered 2017-12-18 – 2017-12-20 (×3): 40 mg via ORAL
  Filled 2017-12-17 (×3): qty 1

## 2017-12-17 MED ORDER — CIPROFLOXACIN IN D5W 400 MG/200ML IV SOLN: 400.0000 mg | Freq: Once | INTRAVENOUS | Status: DC

## 2017-12-17 MED ORDER — POLYETHYLENE GLYCOL 3350 17 G PO PACK
17.0000 g | PACK | Freq: Every day | ORAL | Status: DC | PRN
  Administered 2017-12-19: 17 g via ORAL
  Filled 2017-12-17 (×2): qty 1

## 2017-12-17 MED ORDER — AMIODARONE HCL 100 MG PO TABS
100.0000 mg | ORAL_TABLET | Freq: Every day | ORAL | Status: DC
  Administered 2017-12-18 – 2017-12-20 (×3): 100 mg via ORAL
  Filled 2017-12-17 (×3): qty 1

## 2017-12-17 MED ORDER — ONDANSETRON HCL 4 MG/2ML IJ SOLN
4.0000 mg | Freq: Once | INTRAMUSCULAR | Status: DC
  Filled 2017-12-17 (×2): qty 2

## 2017-12-17 MED ORDER — ALBUTEROL SULFATE (2.5 MG/3ML) 0.083% IN NEBU: 2.5000 mg | INHALATION_SOLUTION | RESPIRATORY_TRACT | Status: DC | PRN

## 2017-12-17 MED ORDER — POTASSIUM CHLORIDE 20 MEQ/15ML (10%) PO SOLN
40.0000 meq | Freq: Once | ORAL | Status: AC
  Administered 2017-12-18: 40 meq via ORAL
  Filled 2017-12-17: qty 30

## 2017-12-17 MED ORDER — LORAZEPAM 0.5 MG PO TABS
0.5000 mg | ORAL_TABLET | Freq: Two times a day (BID) | ORAL | Status: DC
  Administered 2017-12-18 – 2017-12-20 (×6): 0.5 mg via ORAL
  Filled 2017-12-17 (×6): qty 1

## 2017-12-17 MED ORDER — HYDRALAZINE HCL 20 MG/ML IJ SOLN: 5.0000 mg | INTRAMUSCULAR | Status: DC | PRN

## 2017-12-17 MED ORDER — SODIUM CHLORIDE 0.9 % IV BOLUS
1000.0000 mL | Freq: Once | INTRAVENOUS | Status: AC
  Administered 2017-12-17: 1000 mL via INTRAVENOUS

## 2017-12-17 NOTE — ED Provider Notes (Signed)
[facility] DEPT Provider Note   CSN: [id number] Arrival date & time: 12/17/17  1612     History   Chief Complaint Chief Complaint  Patient presents with  . Weakness    HPI Angela Hines is a 84 y.o. female hx of afib on xarelto, diabetes, high cholesterol, hypertension here presenting with weakness, nausea, vomiting.  Patient states that she is from [facility] independent [facility].  Patient states that she has been having nausea and vomiting and diarrhea since yesterday.  She states that she vomited more than 10 times.  Has some loose stools as well. Also has generalized weakness as well. Given 4 mg of zofran by EMS. Patient was admitted about a month ago for unintentional tylenol overdose and denies any overdose today.   The history is provided by the patient.    Past Medical History:  Diagnosis Date  . Atrial fibrillation ([place])   . CHF (congestive heart failure) ([place])   . COPD (chronic obstructive pulmonary disease) ([place])   . Diabetes mellitus without complication ([place])   . High cholesterol   . Hypertension     Patient Active Problem List   Diagnosis Date Noted  . Falls frequently 12/14/2017  . Paracetamol (acetaminophen) overdose, accidental or unintentional, initial encounter 11/21/2017  . Nausea & vomiting 09/10/2017  . Chest pain 09/10/2017  . High anion gap metabolic acidosis 91/63/8466  . Chronic systolic CHF (congestive heart failure) ([place]) 09/10/2017  . Protein-calorie malnutrition, severe 06/11/2017  . Prolonged QT interval   . Diabetes mellitus ([place]) 07/17/2016  . Gastroesophageal reflux disease without esophagitis 07/17/2016  . Generalized anxiety disorder 07/17/2016  . Hypercholesterolemia 07/17/2016  . Hypertension 07/17/2016  . Status post placement of implantable loop recorder 07/17/2016  . AF (paroxysmal atrial fibrillation) ([place]) 07/17/2016  . Primary insomnia 07/17/2016  . S/P AVR (aortic valve replacement)  07/17/2016  . History of syncope 08/07/2014    Past Surgical History:  Procedure Laterality Date  . CARDIAC PACEMAKER PLACEMENT  07/30/2014  . 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 CARDIOVERSION N/A 06/15/2017   Procedure: CARDIOVERSION;  Surgeon: Lelon Perla, MD;  Location: [facility] ENDOSCOPY;  Service: Cardiovascular;  Laterality: N/A;  . CHOLECYSTECTOMY    . EXCISIONAL HEMORRHOIDECTOMY    . LOOP RECORDER INSERTION N/A 08/22/2017   Procedure: LOOP RECORDER INSERTION;  Surgeon: [name], MD;  Location: [facility] CV LAB;  Service: Cardiovascular;  Laterality: N/A;  . LOOP RECORDER REMOVAL N/A 08/22/2017   Procedure: LOOP RECORDER REMOVAL;  Surgeon: [name], MD;  Location: [facility] CV LAB;  Service: Cardiovascular;  Laterality: N/A;     OB History   None      Home Medications    Prior to Admission medications   Medication Sig Start Date End Date Taking? Authorizing Provider  amiodarone (PACERONE) 100 MG tablet Take 100 mg by mouth daily.   Yes [provider]  amLODipine (NORVASC) 2.5 MG tablet Take 1 tablet (2.5 mg total) by mouth daily. 10/23/17 01/21/18 Yes Daune Perch, NP  bismuth subsalicylate (PEPTO BISMOL) 262 MG/15ML suspension Take 30 mLs by mouth every 6 (six) hours as needed for indigestion.   Yes [provider]  buPROPion (WELLBUTRIN XL) 300 MG 24 hr tablet Take 150 mg by mouth daily.  06/20/17  Yes [provider]  feeding supplement, ENSURE ENLIVE, (ENSURE ENLIVE) LIQD Take 237 mLs by mouth 3 (three) times daily between meals. Patient taking differently: Take 237  mLs by mouth daily.  06/12/17  Yes Lavina Hamman, MD  furosemide (LASIX) 20 MG tablet Take 1 tablet (20 mg total) by mouth daily. 10/23/17  Yes Daune Perch, NP  gabapentin (NEURONTIN) 300 MG capsule Take 600 mg by mouth at bedtime.  07/17/16  Yes [provider]  ibuprofen (ADVIL,MOTRIN) 200 MG tablet Take 400 mg by mouth daily as needed for  headache.   Yes [provider]  LORazepam (ATIVAN) 0.5 MG tablet Take 1 tablet (0.5 mg total) by mouth 3 (three) times daily as needed for anxiety. Patient taking differently: Take 0.5 mg by mouth 3 (three) times daily. 1 tablet in the morning, 1 tablet in the evening and 2 tablets every night at bedtime 11/14/17  Yes Upstill, Shari, PA-C  magnesium oxide (MAG-OX) 400 (241.3 Mg) MG tablet Take 1 tablet (400 mg total) by mouth daily. 05/24/17  Yes Nita Sells, MD  metFORMIN (GLUCOPHAGE) 1000 MG tablet Take 1,000 mg by mouth daily. 12/01/16  Yes [provider]  metoprolol tartrate (LOPRESSOR) 25 MG tablet Take 25 mg by mouth 2 (two) times daily.   Yes [provider]  omeprazole (PRILOSEC) 40 MG capsule Take 40 mg by mouth daily.    Yes [provider]  polyethylene glycol (MIRALAX / GLYCOLAX) packet Take 17 g by mouth daily as needed for mild constipation. 06/12/17  Yes Lavina Hamman, MD  Rivaroxaban (XARELTO) 15 MG TABS tablet Take 15 mg by mouth daily with supper. 07/17/16  Yes [provider]  albuterol (PROVENTIL HFA;VENTOLIN HFA) 108 (90 Base) MCG/ACT inhaler Inhale 2 puffs into the lungs every 6 (six) hours as needed for shortness of breath.    [provider]    Family History Family History  Problem Relation Age of Onset  . Emphysema Father   . Hypertension Sister   . Lung disease Sister   . Hypertension Brother   . Hypertension Brother   . Stroke Brother   . Other Son        one spleen removed, one with chronic pain from MVA  . Diabetes Son   . Multiple myeloma Son   . Heart disease Son   . Heart attack Son     Social History Social History   Tobacco Use  . Smoking status: Never Smoker  . Smokeless tobacco: Never Used  Substance Use Topics  . Alcohol use: No  . Drug use: No     Allergies   Clonidine; Captopril; Codeine; and Ceclor [cefaclor]   Review of Systems Review of Systems  Gastrointestinal:  Positive for nausea and vomiting.  Neurological: Positive for weakness.  All other systems reviewed and are negative.    Physical Exam Updated Vital Signs BP (!) 152/90   Pulse 80   Temp 98.3 F (36.8 C) (Oral)   Resp 18   Ht 5' 7"  (1.702 m)   Wt 50.3 kg   SpO2 99%   BMI 17.39 kg/m   Physical Exam  Constitutional: She is oriented to person, place, and time.  Slightly dehydrated   HENT:  Head: Normocephalic.  MM dry   Eyes: Pupils are equal, round, and reactive to light. Conjunctivae and EOM are normal.  Neck: Normal range of motion. Neck supple.  Cardiovascular: Normal rate, regular rhythm and normal heart sounds.  Pulmonary/Chest: Effort normal and breath sounds normal. No stridor. No respiratory distress. She has no wheezes.  Abdominal: Soft. Bowel sounds are normal.  Mild epigastric tenderness, no rebound.  Musculoskeletal: Normal range of motion.  Neurological: She is alert and oriented to person, place, and time. No cranial nerve deficit. Coordination normal.  Skin: Skin is warm. Capillary refill takes less than 2 seconds.  Psychiatric: She has a normal mood and affect.  Nursing note and vitals reviewed.    ED Treatments / Results  Labs (all labs ordered are listed, but only abnormal results are displayed) Labs Reviewed  CBC WITH DIFFERENTIAL/PLATELET - Abnormal; Notable for the following components:      Result Value   Hemoglobin 10.0 (*)    HCT 32.9 (*)    MCH 25.8 (*)    RDW 15.7 (*)    Platelets 452 (*)    All other components within normal limits  COMPREHENSIVE METABOLIC PANEL - Abnormal; Notable for the following components:   Potassium 3.1 (*)    Chloride 97 (*)    All other components within normal limits  URINALYSIS, ROUTINE W REFLEX MICROSCOPIC - Abnormal; Notable for the following components:   Hgb urine dipstick MODERATE (*)    Leukocytes, UA LARGE (*)    Bacteria, UA RARE (*)    All other components within normal limits  URINE CULTURE    LIPASE, BLOOD  I-STAT TROPONIN, ED  CBG MONITORING, ED    EKG EKG Interpretation  Date/Time:  Monday December 17 2017 16:42:56 EDT Ventricular Rate:  75 PR Interval:    QRS Duration: 111 QT Interval:  435 QTC Calculation: 486 R Axis:   -48 Text Interpretation:  Sinus rhythm Atrial premature complex LVH with IVCD, LAD and secondary repol abnrm Borderline prolonged QT interval No significant change since last tracing Confirmed by Wandra Arthurs [phone]) on 12/17/2017 4:55:46 PM   Radiology Dg Chest 2 View  Result Date: 12/17/2017 CLINICAL DATA:  84 year old female with generalized weakness, nausea vomiting. EXAM: CHEST - 2 VIEW COMPARISON:  Chest radiographs 09/10/2017 and earlier. Lumbar radiographs 11/21/2017. FINDINGS: Semi upright AP and lateral views of the chest. Chronic anterior chest wall cardiac loop recorder. Stable mild cardiomegaly and prior aortic valve replacement. Chronically large lung volumes. No pneumothorax, pulmonary edema, pleural effusion or acute pulmonary opacity. Stable mild L1 compression fracture since July. Abdominal Calcified aortic atherosclerosis. Negative visible bowel gas pattern. No pneumoperitoneum is visible. IMPRESSION: 1.  No acute cardiopulmonary abnormality. 2. Stable mild L1 compression fracture since July. 3.  Aortic Atherosclerosis (ICD10-I70.0). Electronically Signed   By: Genevie Ann M.D.   On: 12/17/2017 17:26   Ct Abdomen Pelvis W Contrast  Result Date: 12/17/2017 CLINICAL DATA:  Generalized weakness, nausea and vomiting for the past 2-3 days. EXAM: CT ABDOMEN AND PELVIS WITH CONTRAST TECHNIQUE: Multidetector CT imaging of the abdomen and pelvis was performed using the standard protocol following bolus administration of intravenous contrast. CONTRAST:  126m ISOVUE-300 IOPAMIDOL (ISOVUE-300) INJECTION 61% COMPARISON:  09/10/2017. FINDINGS: Lower chest: Multiple small nodular densities at the lung bases with an interval decrease in size and number.  Hepatobiliary: Cholecystectomy clips. No significant change in intrahepatic and extrahepatic biliary ductal dilatation. No obstructing stone or mass seen. A partially enhancing inferior right lobe liver mass is again demonstrated. Including the poorly defined enhancing components, this measures 1.5 x 1.3 cm on image number 32 series 2. Pancreas: The previously demonstrated 1.4 x 0.8 cm cystic area in the uncinate process measures 1.6 x 0.8 cm in corresponding dimensions on image number 28 series 2. Moderate atrophy of the remainder the pancreas without significant change. Spleen: Normal in size without focal abnormality. Adrenals/Urinary Tract:  Adrenal glands are unremarkable. Kidneys are normal, without renal calculi, focal lesion, or hydronephrosis. Bladder is unremarkable. Stomach/Bowel: Interval jejunal intussusception in the left upper abdomen. A 2nd jejunal intussusception is demonstrated in the left lower abdomen at the level of the upper pelvis. No visible mass. No proximal dilatation. Prominent stool in the colon. Normal appearing appendix. Vascular/Lymphatic: Atheromatous arterial calcifications without aneurysm. No enlarged lymph nodes. Reproductive: Small calcified uterine fibroids. No adnexal masses. Other: No abdominal wall hernia or abnormality. No abdominopelvic ascites. Musculoskeletal: Proximal right femoral bone island. Lumbar spine degenerative changes. Interval approximately 35% L2 vertebral body superior endplate compression deformity with sclerosis and mild bony retropulsion. No acute fracture lines are seen. IMPRESSION: 1. Interval jejunal intussusception in the left upper abdomen and 2nd jejunal intussusception in the left lower abdomen. There is no evidence of bowel obstruction or ischemia. 2. Interval approximately 35% L2 vertebral body superior endplate compression deformity with sclerosis and mild bony retropulsion. No acute fracture. 3. Interval decrease in size and number of small  nodular densities at the lung bases, compatible with improving infection or inflammation. 4. Stable post cholecystectomy biliary ductal dilatation. 5. Slowly enlarging probable atypical hemangioma in the inferior right lobe of the liver. 6. No significant change in a cystic lesion in the uncinate process of the pancreas and diffuse pancreatic atrophy. Electronically Signed   By: Claudie Revering M.D.   On: 12/17/2017 21:01    Procedures Procedures (including critical care time)  CRITICAL CARE Performed by: Wandra Arthurs   Total critical care time: 30 minutes  Critical care time was exclusive of separately billable procedures and treating other patients.  Critical care was necessary to treat or prevent imminent or life-threatening deterioration.  Critical care was time spent personally by me on the following activities: development of treatment plan with patient and/or surrogate as well as nursing, discussions with consultants, evaluation of patient's response to treatment, examination of patient, obtaining history from patient or surrogate, ordering and performing treatments and interventions, ordering and review of laboratory studies, ordering and review of radiographic studies, pulse oximetry and re-evaluation of patient's condition.   Medications Ordered in ED Medications  iopamidol (ISOVUE-300) 61 % injection (has no administration in time range)  ciprofloxacin (CIPRO) IVPB 400 mg (has no administration in time range)  sodium chloride 0.9 % bolus 1,000 mL (0 mLs Intravenous Stopped 12/17/17 1855)  ondansetron (ZOFRAN) injection 4 mg (4 mg Intravenous Given 12/17/17 1729)  iohexol (OMNIPAQUE) 300 MG/ML solution 30 mL (30 mLs Oral Contrast Given 12/17/17 1743)  iopamidol (ISOVUE-300) 61 % injection 100 mL (100 mLs Intravenous Contrast Given 12/17/17 2008)     Initial Impression / Assessment and Plan / ED Course  I have reviewed the triage vital signs and the nursing notes.  Pertinent labs &  imaging results that were available during my care of the patient were reviewed by me and considered in my medical decision making (see chart for details).    Alessia Gonsalez is a 84 y.o. female here with vomiting, abdominal pain. Likely gastroenteritis. Given her age, epigastric tenderness, will get CT ab/pel and labs and lipase and UA. Will hydrate and reassess.   9:20 PM UA + UTI. CT showed 2 areas of jejunal intussusceptions. I called Dr. Barry Dienes from surgery. She states that gastroenteritis can look like intussusception on CT and recommend hydration, antiemetics, hold off on NG tube. Nausea improved. Will admit to hospitalist for hydration. Surgery to follow as consult.    Final Clinical Impressions(s) /  ED Diagnoses   Final diagnoses:  None    ED Discharge Orders    None       Drenda Freeze, MD 12/17/17 2122

## 2017-12-17 NOTE — H&P (Signed)
History and Physical    Karn Derk GYK:[id number] DOB: [birth year] DOA: 12/17/2017  Referring MD/NP/PA:   PCP: Kelton Pillar, MD   Patient coming from:  The patient is coming from  [facility] independent living.  At baseline, pt is partially dependent for most of ADL.  Chief Complaint: Nausea, vomiting  HPI: Angela Hines is a 84 y.o. female with medical history significant of A fib on xarelto,  hypertension, hyperlipidemia, diabetes mellitus, COPD, GERD, depression, anxiety, s/p of TAVR with bioprosthetic valve, atrial fibrillation on Xarelto, pacemaker placement, CHF with EF of 40%, recent admission for Tylenol overdose, who presents with nausea, vomiting.  Patient states that she has been having nausea and vomiting in the past 3 days.  She has nonbloody non-biliary vomiting, approximately 5 times each day, many dry heaves.  Patient does not have diarrhea.  Last bowel movement was on Saturday.  Patient denies any abdominal pain.  No fever or chills.  She states that she has increased urinary frequency, but no dysuria or burning on urination.  Denies chest pain, shortness breath, cough.  No unilateral weakness.  Patient went to urgent care, but was sent to ED for further evaluation and treatment.  ED Course: pt was found to have WBC 9.6, positive urinalysis (large amount of leukocyte, WBC 21-50, rare bacteria, clear appearance), electrolytes renal function okay, temperature normal, no tachycardia, no tachypnea, oxygen saturation 99% on room air.  Chest x-ray negative.  CT abdomen/pelvis showed possible jejunal intussusception. Patient is admitted to telemetry bed as inpatient.  General surgeon, Dr. Barry Dienes was consulted.  Review of Systems:   General: no fevers, chills, no body weight gain, has poor appetite, has fatigue HEENT: no blurry vision, hearing changes or sore throat Respiratory: no dyspnea, coughing, wheezing CV: no chest pain, no palpitations GI: has nausea, vomiting, no  abdominal pain, diarrhea, constipation GU: no dysuria, burning on urination, increased urinary frequency, hematuria  Ext: no leg edema Neuro: no unilateral weakness, numbness, or tingling, no vision change or hearing loss Skin: no rash, no skin tear. MSK: No muscle spasm, no deformity, no limitation of range of movement in spin Heme: No easy bruising.  Travel history: No recent long distant travel.  Allergy:  Allergies  Allergen Reactions  . Clonidine Swelling and Other (See Comments)    "head swelling"  . Captopril Swelling  . Codeine Nausea And Vomiting and Other (See Comments)    GI Intolerance   . Ampicillin Rash  . Ceclor [Cefaclor] Rash    Past Medical History:  Diagnosis Date  . Atrial fibrillation ([place])   . CHF (congestive heart failure) ([place])   . COPD (chronic obstructive pulmonary disease) ([place])   . Diabetes mellitus without complication ([place])   . High cholesterol   . Hypertension     Past Surgical History:  Procedure Laterality Date  . CARDIAC PACEMAKER PLACEMENT  07/30/2014  . 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 CARDIOVERSION N/A 06/15/2017   Procedure: CARDIOVERSION;  Surgeon: Lelon Perla, MD;  Location: [facility] ENDOSCOPY;  Service: Cardiovascular;  Laterality: N/A;  . CHOLECYSTECTOMY    . EXCISIONAL HEMORRHOIDECTOMY    . LOOP RECORDER INSERTION N/A 08/22/2017   Procedure: LOOP RECORDER INSERTION;  Surgeon: [name], MD;  Location: [place] CV LAB;  Service: Cardiovascular;  Laterality: N/A;  . LOOP RECORDER REMOVAL N/A 08/22/2017   Procedure: LOOP RECORDER REMOVAL;  Surgeon: [name], MD;  Location: [place] CV LAB;  Service: Cardiovascular;  Laterality: N/A;    Social History:  reports that she has never smoked. She has never used smokeless tobacco. She reports that she does not drink alcohol or use drugs.  Family History:  Family History  Problem Relation Age of Onset  . Emphysema Father   . Hypertension Sister   .  Lung disease Sister   . Hypertension Brother   . Hypertension Brother   . Stroke Brother   . Other Son        one spleen removed, one with chronic pain from MVA  . Diabetes Son   . Multiple myeloma Son   . Heart disease Son   . Heart attack Son      Prior to Admission medications   Medication Sig Start Date End Date Taking? Authorizing Provider  amiodarone (PACERONE) 100 MG tablet Take 100 mg by mouth daily.   Yes [provider]  amLODipine (NORVASC) 2.5 MG tablet Take 1 tablet (2.5 mg total) by mouth daily. 10/23/17 01/21/18 Yes Daune Perch, NP  bismuth subsalicylate (PEPTO BISMOL) 262 MG/15ML suspension Take 30 mLs by mouth every 6 (six) hours as needed for indigestion.   Yes [provider]  buPROPion (WELLBUTRIN XL) 300 MG 24 hr tablet Take 150 mg by mouth daily.  06/20/17  Yes [provider]  feeding supplement, ENSURE ENLIVE, (ENSURE ENLIVE) LIQD Take 237 mLs by mouth 3 (three) times daily between meals. Patient taking differently: Take 237 mLs by mouth daily.  06/12/17  Yes Lavina Hamman, MD  furosemide (LASIX) 20 MG tablet Take 1 tablet (20 mg total) by mouth daily. 10/23/17  Yes Daune Perch, NP  gabapentin (NEURONTIN) 300 MG capsule Take 600 mg by mouth at bedtime.  07/17/16  Yes [provider]  ibuprofen (ADVIL,MOTRIN) 200 MG tablet Take 400 mg by mouth daily as needed for headache.   Yes [provider]  LORazepam (ATIVAN) 0.5 MG tablet Take 1 tablet (0.5 mg total) by mouth 3 (three) times daily as needed for anxiety. Patient taking differently: Take 0.5 mg by mouth 3 (three) times daily. 1 tablet in the morning, 1 tablet in the evening and 2 tablets every night at bedtime 11/14/17  Yes Upstill, Shari, PA-C  magnesium oxide (MAG-OX) 400 (241.3 Mg) MG tablet Take 1 tablet (400 mg total) by mouth daily. 05/24/17  Yes Nita Sells, MD  metFORMIN (GLUCOPHAGE) 1000 MG tablet Take 1,000 mg by mouth daily. 12/01/16  Yes [provider]  metoprolol tartrate (LOPRESSOR) 25 MG tablet Take 25 mg by mouth 2 (two) times daily.   Yes [provider]  omeprazole (PRILOSEC) 40 MG capsule Take 40 mg by mouth daily.    Yes [provider]  polyethylene glycol (MIRALAX / GLYCOLAX) packet Take 17 g by mouth daily as needed for mild constipation. 06/12/17  Yes Lavina Hamman, MD  Rivaroxaban (XARELTO) 15 MG TABS tablet Take 15 mg by mouth daily with supper. 07/17/16  Yes [provider]  albuterol (PROVENTIL HFA;VENTOLIN HFA) 108 (90 Base) MCG/ACT inhaler Inhale 2 puffs into the lungs every 6 (six) hours as needed for shortness of breath.    [provider]    Physical Exam: Vitals:   12/17/17 2200 12/17/17 2230 12/17/17 2312 12/17/17 2313  BP: (!) 156/75 (!) 149/73  (!) 161/73  Pulse: 71 69  74  Resp: 13 11  18   Temp:    98.5 F (36.9 C)  TempSrc:    Oral  SpO2: 100% 97%  100%  Weight:   48.3 kg   Height:   5' 7"  (1.702 m)    General: Not in acute distress. Very thin body habitus, looks exhausted HEENT:       Eyes: PERRL, EOMI, no scleral icterus.       ENT: No discharge from the ears and nose, no pharynx injection, no tonsillar enlargement.        Neck: No JVD, no bruit, no mass felt. Heme: No neck lymph node enlargement. Cardiac: Q7/M2, RRR, 2/6 systolic murmurs, No gallops or rubs. Respiratory: No rales, wheezing, rhonchi or rubs. GI: Soft, nondistended, nontender, no rebound pain, no organomegaly, BS present. GU: No hematuria Ext: No pitting leg edema bilaterally. 2+DP/PT pulse bilaterally. Musculoskeletal: No joint deformities, No joint redness or warmth, no limitation of ROM in spin. Skin: No rashes.  Neuro: Alert, oriented X3, cranial nerves II-XII grossly intact, moves all extremities normally.  Psych: Patient is not psychotic, no suicidal or hemocidal ideation.  Labs on Admission: I have personally reviewed following labs and imaging studies  CBC: Recent Labs    Lab 15-Jan-2018 1733  WBC 9.6  NEUTROABS 6.3  HGB 10.0*  HCT 32.9*  MCV 85.0  PLT 263*   Basic Metabolic Panel: Recent Labs  Lab 2018/01/15 1733  NA 138  K 3.1*  CL 97*  CO2 27  GLUCOSE 90  BUN 20  CREATININE 0.70  CALCIUM 9.6   GFR: Estimated Creatinine Clearance: 40.6 mL/min (by C-G formula based on SCr of 0.7 mg/dL). Liver Function Tests: Recent Labs  Lab January 15, 2018 1733  AST 26  ALT 15  ALKPHOS 88  BILITOT 0.5  PROT 7.7  ALBUMIN 4.0   Recent Labs  Lab January 15, 2018 1733  LIPASE 26   No results for input(s): AMMONIA in the last 168 hours. Coagulation Profile: No results for input(s): INR, PROTIME in the last 168 hours. Cardiac Enzymes: No results for input(s): CKTOTAL, CKMB, CKMBINDEX, TROPONINI in the last 168 hours. BNP (last 3 results) No results for input(s): PROBNP in the last 8760 hours. HbA1C: No results for input(s): HGBA1C in the last 72 hours. CBG: Recent Labs  Lab 01-15-18 1644  GLUCAP 98   Lipid Profile: No results for input(s): CHOL, HDL, LDLCALC, TRIG, CHOLHDL, LDLDIRECT in the last 72 hours. Thyroid Function Tests: No results for input(s): TSH, T4TOTAL, FREET4, T3FREE, THYROIDAB in the last 72 hours. Anemia Panel: No results for input(s): VITAMINB12, FOLATE, FERRITIN, TIBC, IRON, RETICCTPCT in the last 72 hours. Urine analysis:    Component Value Date/Time   COLORURINE YELLOW 01-15-18 1918   APPEARANCEUR CLEAR 15-Jan-2018 1918   LABSPEC 1.010 Jan 15, 2018 1918   PHURINE 7.0 01-15-18 1918   GLUCOSEU NEGATIVE 01/15/2018 1918   HGBUR MODERATE (A) 2018/01/15 1918   BILIRUBINUR NEGATIVE 01/15/2018 1918   KETONESUR NEGATIVE 01/15/18 1918   PROTEINUR NEGATIVE January 15, 2018 1918   NITRITE NEGATIVE 01-15-2018 1918   LEUKOCYTESUR LARGE (A) 01/15/18 1918   Sepsis Labs: @LABRCNTIP (procalcitonin:4,lacticidven:4) )No results found for this or any previous visit (from the past 240 hour(s)).   Radiological Exams on Admission: Dg Chest 2  View  Result Date: 01/15/18 CLINICAL DATA:  84 year old female with generalized weakness, nausea vomiting. EXAM: CHEST - 2 VIEW COMPARISON:  Chest radiographs 09/10/2017 and earlier. Lumbar radiographs 11/21/2017. FINDINGS: Semi upright AP and lateral views of the chest. Chronic anterior chest wall cardiac loop recorder. Stable mild cardiomegaly and prior aortic valve replacement. Chronically large lung volumes. No pneumothorax, pulmonary edema, pleural effusion or acute pulmonary opacity.  Stable mild L1 compression fracture since July. Abdominal Calcified aortic atherosclerosis. Negative visible bowel gas pattern. No pneumoperitoneum is visible. IMPRESSION: 1.  No acute cardiopulmonary abnormality. 2. Stable mild L1 compression fracture since July. 3.  Aortic Atherosclerosis (ICD10-I70.0). Electronically Signed   By: Genevie Ann M.D.   On: 12/17/2017 17:26   Ct Abdomen Pelvis W Contrast  Result Date: 12/17/2017 CLINICAL DATA:  Generalized weakness, nausea and vomiting for the past 2-3 days. EXAM: CT ABDOMEN AND PELVIS WITH CONTRAST TECHNIQUE: Multidetector CT imaging of the abdomen and pelvis was performed using the standard protocol following bolus administration of intravenous contrast. CONTRAST:  187m ISOVUE-300 IOPAMIDOL (ISOVUE-300) INJECTION 61% COMPARISON:  09/10/2017. FINDINGS: Lower chest: Multiple small nodular densities at the lung bases with an interval decrease in size and number. Hepatobiliary: Cholecystectomy clips. No significant change in intrahepatic and extrahepatic biliary ductal dilatation. No obstructing stone or mass seen. A partially enhancing inferior right lobe liver mass is again demonstrated. Including the poorly defined enhancing components, this measures 1.5 x 1.3 cm on image number 32 series 2. Pancreas: The previously demonstrated 1.4 x 0.8 cm cystic area in the uncinate process measures 1.6 x 0.8 cm in corresponding dimensions on image number 28 series 2. Moderate atrophy of  the remainder the pancreas without significant change. Spleen: Normal in size without focal abnormality. Adrenals/Urinary Tract: Adrenal glands are unremarkable. Kidneys are normal, without renal calculi, focal lesion, or hydronephrosis. Bladder is unremarkable. Stomach/Bowel: Interval jejunal intussusception in the left upper abdomen. A 2nd jejunal intussusception is demonstrated in the left lower abdomen at the level of the upper pelvis. No visible mass. No proximal dilatation. Prominent stool in the colon. Normal appearing appendix. Vascular/Lymphatic: Atheromatous arterial calcifications without aneurysm. No enlarged lymph nodes. Reproductive: Small calcified uterine fibroids. No adnexal masses. Other: No abdominal wall hernia or abnormality. No abdominopelvic ascites. Musculoskeletal: Proximal right femoral bone island. Lumbar spine degenerative changes. Interval approximately 35% L2 vertebral body superior endplate compression deformity with sclerosis and mild bony retropulsion. No acute fracture lines are seen. IMPRESSION: 1. Interval jejunal intussusception in the left upper abdomen and 2nd jejunal intussusception in the left lower abdomen. There is no evidence of bowel obstruction or ischemia. 2. Interval approximately 35% L2 vertebral body superior endplate compression deformity with sclerosis and mild bony retropulsion. No acute fracture. 3. Interval decrease in size and number of small nodular densities at the lung bases, compatible with improving infection or inflammation. 4. Stable post cholecystectomy biliary ductal dilatation. 5. Slowly enlarging probable atypical hemangioma in the inferior right lobe of the liver. 6. No significant change in a cystic lesion in the uncinate process of the pancreas and diffuse pancreatic atrophy. Electronically Signed   By: SClaudie ReveringM.D.   On: 12/17/2017 21:01     EKG: Independently reviewed.  Sinus rhythm, QTC 486, LAD, PAC, T wave inversion in lead  V3-V5   Assessment/Plan Principal Problem:   Nausea & vomiting Active Problems:   COPD (chronic obstructive pulmonary disease) (HCC)   Gastroesophageal reflux disease without esophagitis   Hypertension   AF (paroxysmal atrial fibrillation) (H[facility])   S/P AVR (aortic valve replacement)   Protein-calorie malnutrition, severe   Chronic systolic CHF (congestive heart failure) ([facility])   Diabetes mellitus without complication ([facility])   Depression with anxiety   UTI (urinary tract infection)   Hypokalemia   Nausea & vomiting: no AP. CT abdomen/pelvis showed possible jejunal intussusception. EDP consulted, general surgeon, Dr. BBarry Dienes Per Dr. BBarry Dienes "gastroenteritis can look like intussusception  on CT and recommend hydration, antiemetics, hold off on NG tube". Pt dose not have AP.  -will admit to tele bed as inpt -IVF: 1L NS in Ed -prn Zofran for nausea - hold Xarelto in case pt needs surgery -f/u Dr. Marlowe Aschoff recommendations  COPD (chronic obstructive pulmonary disease) ([place]): stable. -prn albuterol nebs  GERD: -Protonix  HTN:  -Continue home medications: Amlodipine -IV hydralazine prn  AF (paroxysmal atrial fibrillation) ([place]): CHA2DS2-VASc Score is 6, needs oral anticoagulation. Patient is on Xarelto at home. Heart rate is well controlled. -continue metoprolol, amiodarone -hold Xarelto in case pt needs surgery   S/P AVR (aortic valve replacement): has 2/6 systolic murmur.  -No acute issues  Chronic systolic CHF (congestive heart failure) ([place]): 2D echo on 06/12/2017 showed EF 40--45%.  Patient does not have leg edema or JVD.  No worsening shortness of breath.  CHF seems to be compensated. -Hold Lasix while patient is on being p.o. -check BNP  Diabetes mellitus without complication ([place]): Last A1c 6.0 on 11/16/16, well controled. Patient is taking metformin at home -SSI  Depression with anxiety: -continue home meds  Hypokalemia: K= 3.1 on admission. - Repleted - Check Mg  level - Give 1 g of magnesium sulfate  UTI: -Aztreonam -Follow-up of blood culture and urine culture  Protein-calorie malnutrition, severe: -resume nutrition supplement when able to eat  Inpatient status:  # Patient requires inpatient status due to high intensity of service, high risk for further deterioration and high frequency of surveillance required.  I certify that at the point of admission it is my clinical judgment that the patient will require inpatient hospital care spanning beyond 2 midnights from the point of admission.  . This patient has multiple chronic comorbidities including A fib on xarelto,  hypertension, hyperlipidemia, diabetes mellitus, COPD, GERD, depression, anxiety, s/p of TAVR with bioprosthetic valve, atrial fibrillation on Xarelto, pacemaker placement, CHF with EF of 40%  . Now patient has presenting symptoms include nausea vomiting . The worrisome physical exam findings include: very thin body habitus, looks exhausted . The initial radiographic and laboratory data are worrisome because CT scan showed possible jejunal intussusception, patient may need surgery if getting worse. . Current medical needs: please see my assessment and plan   DVT ppx: SCD Code Status: DNR (I discussed with patient in the presence of her son who is the POS, and explained the meaning of CODE STATUS. Patient wants to be DNR) Family Communication: None at bed side.    Disposition Plan:  Anticipate discharge back to  [facility] independent living. Consults called: General surgeon, Dr. Barry Dienes Admission status:  Inpatient/tele     Date of Service 12/17/2017    Ivor Costa [facility] Pager [phone]  If 7PM-7AM, please contact night-coverage [email or web address] Password [facility] 12/17/2017, 11:50 PM

## 2017-12-17 NOTE — ED Notes (Signed)
Will hold patient for 15 minutes due to the request Ray, RN primary nurse for room 1424.

## 2017-12-17 NOTE — ED Notes (Signed)
ED TO INPATIENT HANDOFF REPORT  Name/Age/Gender Angela Hines 84 y.o. female  Code Status    Code Status Orders  (From admission, onward)         Start     Ordered   12/17/17 2206  Do not attempt resuscitation (DNR)  Continuous    Question Answer Comment  In the event of cardiac or respiratory ARREST Do not call a "code blue"   In the event of cardiac or respiratory ARREST Do not perform Intubation, CPR, defibrillation or ACLS   In the event of cardiac or respiratory ARREST Use medication by any route, position, wound care, and other measures to relive pain and suffering. May use oxygen, suction and manual treatment of airway obstruction as needed for comfort.      12/17/17 2206        Code Status History    Date Active Date Inactive Code Status Order ID Comments User Context   11/21/2017 1318 11/24/2017 2252 DNR [id number]  Janora Norlander, MD ED   11/21/2017 1318 11/21/2017 1318 Full Code [id number]  Janora Norlander, MD ED   09/10/2017 1543 09/11/2017 1739 DNR [id number]  Samuella Cota, MD ED   09/10/2017 1543 09/10/2017 1543 DNR [id number]  Samuella Cota, MD ED   06/10/2017 0141 06/12/2017 1522 DNR [id number]  Cristy Folks, MD ED   05/15/2017 2231 05/24/2017 1625 DNR [id number]  Elwyn Reach, MD ED    Advance Directive Documentation     Most Recent Value  Type of Advance Directive  Living will  Pre-existing out of facility DNR order (yellow form or pink MOST form)  -  "MOST" Form in Place?  -      Home/SNF/Other Home  Chief Complaint Weakness; N/V  Level of Care/Admitting Diagnosis ED Disposition    ED Disposition Condition [facility]: [place] [100102]  Level of Care: Telemetry [5]  Admit to tele based on following criteria: Other see comments  Comments: a fib  Diagnosis: Nausea & vomiting [093235]  Admitting Physician: Ivor Costa [[id number]]  Attending Physician: Ivor Costa [phone]  Estimated length of stay: past  midnight tomorrow  Certification:: I certify this patient will need inpatient services for at least 2 midnights  PT Class (Do Not Modify): Inpatient [101]  PT Acc Code (Do Not Modify): Private [1]       Medical History Past Medical History:  Diagnosis Date  . Atrial fibrillation ([place])   . CHF (congestive heart failure) ([place])   . COPD (chronic obstructive pulmonary disease) ([place])   . Diabetes mellitus without complication ([place])   . High cholesterol   . Hypertension     Allergies Allergies  Allergen Reactions  . Clonidine Swelling and Other (See Comments)    "head swelling"  . Captopril Swelling  . Codeine Nausea And Vomiting and Other (See Comments)    GI Intolerance   . Ampicillin Rash  . Ceclor [Cefaclor] Rash    IV Location/Drains/Wounds Patient Lines/Drains/Airways Status   Active Line/Drains/Airways    Name:   Placement date:   Placement time:   Site:   Days:   Peripheral IV 12/17/17 Left Wrist   12/17/17    1626    Wrist   less than 1          Labs/Imaging Results for orders placed or performed during the hospital encounter of 12/17/17 (from the past 48 hour(s))  CBG monitoring, ED     Status: None  Collection Time: 12/17/17  4:44 PM  Result Value Ref Range   Glucose-Capillary 98 70 - 99 mg/dL  CBC with Differential/Platelet     Status: Abnormal   Collection Time: 12/17/17  5:33 PM  Result Value Ref Range   WBC 9.6 4.0 - 10.5 K/uL   RBC 3.87 3.87 - 5.11 MIL/uL   Hemoglobin 10.0 (L) 12.0 - 15.0 g/dL   HCT 32.9 (L) 36.0 - 46.0 %   MCV 85.0 78.0 - 100.0 fL   MCH 25.8 (L) 26.0 - 34.0 pg   MCHC 30.4 30.0 - 36.0 g/dL   RDW 15.7 (H) 11.5 - 15.5 %   Platelets 452 (H) 150 - 400 K/uL   Neutrophils Relative % 65 %   Neutro Abs 6.3 1.7 - 7.7 K/uL   Lymphocytes Relative 27 %   Lymphs Abs 2.6 0.7 - 4.0 K/uL   Monocytes Relative 8 %   Monocytes Absolute 0.7 0.1 - 1.0 K/uL   Eosinophils Relative 0 %   Eosinophils Absolute 0.0 0.0 - 0.7 K/uL   Basophils  Relative 0 %   Basophils Absolute 0.0 0.0 - 0.1 K/uL    Comment: Performed at [facility], [place] [address], [place], [place] [address]  Comprehensive metabolic panel     Status: Abnormal   Collection Time: 12/17/17  5:33 PM  Result Value Ref Range   Sodium 138 135 - 145 mmol/L   Potassium 3.1 (L) 3.5 - 5.1 mmol/L   Chloride 97 (L) 98 - 111 mmol/L   CO2 27 22 - 32 mmol/L   Glucose, Bld 90 70 - 99 mg/dL   BUN 20 8 - 23 mg/dL   Creatinine, Ser 0.70 0.44 - 1.00 mg/dL   Calcium 9.6 8.9 - 10.3 mg/dL   Total Protein 7.7 6.5 - 8.1 g/dL   Albumin 4.0 3.5 - 5.0 g/dL   AST 26 15 - 41 U/L   ALT 15 0 - 44 U/L   Alkaline Phosphatase 88 38 - 126 U/L   Total Bilirubin 0.5 0.3 - 1.2 mg/dL   GFR calc non Af Amer >60 >60 mL/min   GFR calc Af Amer >60 >60 mL/min    Comment: (NOTE) The eGFR has been calculated using the CKD EPI equation. This calculation has not been validated in all clinical situations. eGFR's persistently <60 mL/min signify possible Chronic Kidney Disease.    Anion gap 14 5 - 15    Comment: Performed at [facility], [place] [address] [place], [place] [address]  Lipase, blood     Status: None   Collection Time: 12/17/17  5:33 PM  Result Value Ref Range   Lipase 26 11 - 51 U/L    Comment: Performed at [facility], [place] [address], [place], [place] [address]  I-stat troponin, ED     Status: None   Collection Time: 12/17/17  5:42 PM  Result Value Ref Range   Troponin i, poc 0.00 0.00 - 0.08 ng/mL   Comment 3            Comment: Due to the release kinetics of cTnI, a negative result within the first hours of the onset of symptoms does not rule out myocardial infarction with certainty. If myocardial infarction is still suspected, repeat the test at appropriate intervals.   Urinalysis, Routine w reflex microscopic     Status: Abnormal   Collection Time: 12/17/17  7:18 PM  Result Value Ref Range   Color, Urine YELLOW  YELLOW   APPearance CLEAR CLEAR   Specific Gravity, Urine 1.010 1.005 - 1.030   pH 7.0 5.0 - 8.0   Glucose, UA NEGATIVE NEGATIVE mg/dL   Hgb urine dipstick MODERATE (A) NEGATIVE   Bilirubin Urine NEGATIVE NEGATIVE   Ketones, ur NEGATIVE NEGATIVE mg/dL   Protein, ur NEGATIVE NEGATIVE mg/dL   Nitrite NEGATIVE NEGATIVE   Leukocytes, UA LARGE (A) NEGATIVE   RBC / HPF 0-5 0 - 5 RBC/hpf   WBC, UA 21-50 0 - 5 WBC/hpf   Bacteria, UA RARE (A) NONE SEEN   Squamous Epithelial / LPF 0-5 0 - 5    Comment: Performed at [facility], [place] [address] [place], [place] [address]   Dg Chest 2 View  Result Date: 12/17/2017 CLINICAL DATA:  84 year old female with generalized weakness, nausea vomiting. EXAM: CHEST - 2 VIEW COMPARISON:  Chest radiographs 09/10/2017 and earlier. Lumbar radiographs 11/21/2017. FINDINGS: Semi upright AP and lateral views of the chest. Chronic anterior chest wall cardiac loop recorder. Stable mild cardiomegaly and prior aortic valve replacement. Chronically large lung volumes. No pneumothorax, pulmonary edema, pleural effusion or acute pulmonary opacity. Stable mild L1 compression fracture since July. Abdominal Calcified aortic atherosclerosis. Negative visible bowel gas pattern. No pneumoperitoneum is visible. IMPRESSION: 1.  No acute cardiopulmonary abnormality. 2. Stable mild L1 compression fracture since July. 3.  Aortic Atherosclerosis (ICD10-I70.0). Electronically Signed   By: Genevie Ann M.D.   On: 12/17/2017 17:26   Ct Abdomen Pelvis W Contrast  Result Date: 12/17/2017 CLINICAL DATA:  Generalized weakness, nausea and vomiting for the past 2-3 days. EXAM: CT ABDOMEN AND PELVIS WITH CONTRAST TECHNIQUE: Multidetector CT imaging of the abdomen and pelvis was performed using the standard protocol following bolus administration of intravenous contrast. CONTRAST:  129m ISOVUE-300 IOPAMIDOL (ISOVUE-300) INJECTION 61% COMPARISON:  09/10/2017. FINDINGS: Lower chest:  Multiple small nodular densities at the lung bases with an interval decrease in size and number. Hepatobiliary: Cholecystectomy clips. No significant change in intrahepatic and extrahepatic biliary ductal dilatation. No obstructing stone or mass seen. A partially enhancing inferior right lobe liver mass is again demonstrated. Including the poorly defined enhancing components, this measures 1.5 x 1.3 cm on image number 32 series 2. Pancreas: The previously demonstrated 1.4 x 0.8 cm cystic area in the uncinate process measures 1.6 x 0.8 cm in corresponding dimensions on image number 28 series 2. Moderate atrophy of the remainder the pancreas without significant change. Spleen: Normal in size without focal abnormality. Adrenals/Urinary Tract: Adrenal glands are unremarkable. Kidneys are normal, without renal calculi, focal lesion, or hydronephrosis. Bladder is unremarkable. Stomach/Bowel: Interval jejunal intussusception in the left upper abdomen. A 2nd jejunal intussusception is demonstrated in the left lower abdomen at the level of the upper pelvis. No visible mass. No proximal dilatation. Prominent stool in the colon. Normal appearing appendix. Vascular/Lymphatic: Atheromatous arterial calcifications without aneurysm. No enlarged lymph nodes. Reproductive: Small calcified uterine fibroids. No adnexal masses. Other: No abdominal wall hernia or abnormality. No abdominopelvic ascites. Musculoskeletal: Proximal right femoral bone island. Lumbar spine degenerative changes. Interval approximately 35% L2 vertebral body superior endplate compression deformity with sclerosis and mild bony retropulsion. No acute fracture lines are seen. IMPRESSION: 1. Interval jejunal intussusception in the left upper abdomen and 2nd jejunal intussusception in the left lower abdomen. There is no evidence of bowel obstruction or ischemia. 2. Interval approximately 35% L2 vertebral body superior endplate compression deformity with sclerosis and  mild bony retropulsion. No acute fracture. 3. Interval decrease in  size and number of small nodular densities at the lung bases, compatible with improving infection or inflammation. 4. Stable post cholecystectomy biliary ductal dilatation. 5. Slowly enlarging probable atypical hemangioma in the inferior right lobe of the liver. 6. No significant change in a cystic lesion in the uncinate process of the pancreas and diffuse pancreatic atrophy. Electronically Signed   By: Claudie Revering M.D.   On: 12/17/2017 21:01    Pending Labs Unresulted Labs (From admission, onward)    Start     Ordered   12/18/17 0500  Magnesium  Tomorrow morning,   R     12/17/17 2200   12/18/17 0500  Hemoglobin A1c  Tomorrow morning,   R     12/17/17 2203   12/18/17 1975  Basic metabolic panel  Tomorrow morning,   R     12/17/17 2206   12/18/17 0500  CBC  Tomorrow morning,   R     12/17/17 2206   12/17/17 2203  Culture, blood (Routine X 2) w Reflex to ID Panel  BLOOD CULTURE X 2,   R    Comments:  Please obtain prior to antibiotic administration.    12/17/17 2203   12/17/17 2203  Brain natriuretic peptide  Once,   R     12/17/17 2203   12/17/17 1643  Urine culture  STAT,   STAT     12/17/17 1642          Vitals/Pain Today's Vitals   12/17/17 2100 12/17/17 2130 12/17/17 2200 12/17/17 2230  BP: (!) 145/85 131/85 (!) 156/75 (!) 149/73  Pulse: 73 72 71 69  Resp: _0 Temp:      TempSrc:      SpO2: 100% 99% 100% 97%  Weight:      Height:      PainSc:        Isolation Precautions No active isolations  Medications Medications  iopamidol (ISOVUE-300) 61 % injection (has no administration in time range)  ondansetron (ZOFRAN) injection 4 mg (has no administration in time range)  ibuprofen (ADVIL,MOTRIN) tablet 200 mg (has no administration in time range)  amiodarone (PACERONE) tablet 100 mg (has no administration in time range)  amLODipine (NORVASC) tablet 2.5 mg (has no administration in time range)   metoprolol tartrate (LOPRESSOR) tablet 25 mg (has no administration in time range)  buPROPion (WELLBUTRIN XL) 24 hr tablet 150 mg (has no administration in time range)  LORazepam (ATIVAN) tablet 0.5 mg (has no administration in time range)  bismuth subsalicylate (PEPTO BISMOL) 262 MG/15ML suspension 30 mL (has no administration in time range)  pantoprazole (PROTONIX) EC tablet 40 mg (has no administration in time range)  polyethylene glycol (MIRALAX / GLYCOLAX) packet 17 g (has no administration in time range)  gabapentin (NEURONTIN) capsule 600 mg (has no administration in time range)  magnesium oxide (MAG-OX) tablet 400 mg (has no administration in time range)  potassium chloride 20 MEQ/15ML (10%) solution 40 mEq (has no administration in time range)  albuterol (PROVENTIL) (2.5 MG/3ML) 0.083% nebulizer solution 2.5 mg (has no administration in time range)  insulin aspart (novoLOG) injection 0-9 Units (has no administration in time range)  insulin aspart (novoLOG) injection 0-5 Units (has no administration in time range)  ondansetron (ZOFRAN) injection 4 mg (has no administration in time range)  LORazepam (ATIVAN) tablet 1 mg (has no administration in time range)  aztreonam (AZACTAM) 2 g in sodium chloride 0.9 % 100 mL IVPB (has no administration in time  range)  sodium chloride 0.9 % bolus 1,000 mL (0 mLs Intravenous Stopped 12/17/17 1855)  ondansetron (ZOFRAN) injection 4 mg (4 mg Intravenous Given 12/17/17 1729)  iohexol (OMNIPAQUE) 300 MG/ML solution 30 mL (30 mLs Oral Contrast Given 12/17/17 1743)  iopamidol (ISOVUE-300) 61 % injection 100 mL (100 mLs Intravenous Contrast Given 12/17/17 2008)    Mobility walks

## 2017-12-17 NOTE — ED Notes (Signed)
Bed: [id number] Expected date:  Expected time:  Means of arrival:  Comments: EMS-weakness

## 2017-12-17 NOTE — Consult Note (Signed)
Reason for Consult:intussusception Referring Physician:  [name] is an 84 y.o. female.  HPI:  Pt is an 84 yo F resident at independent living facility who comes in with 2-3 days of n/v/d.  The patient states that she has had a few episodes of this in the past 10 years or so, but it always resolved spontaneously.  SHe denies know sick contacts.  She denies fever/chills/myalgias.  She denies dysuria.  She has mild dementia but has been fine at assisted living where they manage her medications.  She denies jaundice or GI bleeding.  She has no known personal or family history of cancer.  CT was performed due to some abdominal tenderness present on exam and it showed two areas of intussusception.    She currently is pain free and nausea has abated.  She does not remember when she last passed gas.  She does not feel bloated.    Past Medical History:  Diagnosis Date  . Atrial fibrillation ([place])   . CHF (congestive heart failure) ([place])   . COPD (chronic obstructive pulmonary disease) ([place])   . Diabetes mellitus without complication ([place])   . High cholesterol   . Hypertension     Past Surgical History:  Procedure Laterality Date  . CARDIAC PACEMAKER PLACEMENT  07/30/2014  . 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 CARDIOVERSION N/A 06/15/2017   Procedure: CARDIOVERSION;  Surgeon: Lelon Perla, MD;  Location: [facility] ENDOSCOPY;  Service: Cardiovascular;  Laterality: N/A;  . CHOLECYSTECTOMY    . EXCISIONAL HEMORRHOIDECTOMY    . LOOP RECORDER INSERTION N/A 08/22/2017   Procedure: LOOP RECORDER INSERTION;  Surgeon: [name], MD;  Location: [place] CV LAB;  Service: Cardiovascular;  Laterality: N/A;  . LOOP RECORDER REMOVAL N/A 08/22/2017   Procedure: LOOP RECORDER REMOVAL;  Surgeon: [name], MD;  Location: [place] CV LAB;  Service: Cardiovascular;  Laterality: N/A;    Family History  Problem Relation Age of Onset  . Emphysema Father   . Hypertension  Sister   . Lung disease Sister   . Hypertension Brother   . Hypertension Brother   . Stroke Brother   . Other Son        one spleen removed, one with chronic pain from MVA  . Diabetes Son   . Multiple myeloma Son   . Heart disease Son   . Heart attack Son     Social History:  reports that she has never smoked. She has never used smokeless tobacco. She reports that she does not drink alcohol or use drugs.  Allergies:  Allergies  Allergen Reactions  . Clonidine Swelling and Other (See Comments)    "head swelling"  . Captopril Swelling  . Codeine Nausea And Vomiting and Other (See Comments)    GI Intolerance   . Ampicillin Rash  . Ceclor [Cefaclor] Rash    Medications:  Prior to Admission:  Medications Prior to Admission  Medication Sig Dispense Refill Last Dose  . amiodarone (PACERONE) 100 MG tablet Take 100 mg by mouth daily.   12/17/2017 at Unknown time  . amLODipine (NORVASC) 2.5 MG tablet Take 1 tablet (2.5 mg total) by mouth daily. 90 tablet 3 12/17/2017 at Unknown time  . bismuth subsalicylate (PEPTO BISMOL) 262 MG/15ML suspension Take 30 mLs by mouth every 6 (six) hours as needed for indigestion.   12/16/2017 at Unknown time  . buPROPion (WELLBUTRIN XL) 300 MG 24 hr tablet Take 150  mg by mouth daily.   5 12/17/2017 at Unknown time  . feeding supplement, ENSURE ENLIVE, (ENSURE ENLIVE) LIQD Take 237 mLs by mouth 3 (three) times daily between meals. (Patient taking differently: Take 237 mLs by mouth daily. ) 237 mL 12 12/16/2017 at Unknown time  . furosemide (LASIX) 20 MG tablet Take 1 tablet (20 mg total) by mouth daily. 90 tablet 3 12/17/2017 at Unknown time  . gabapentin (NEURONTIN) 300 MG capsule Take 600 mg by mouth at bedtime.    12/16/2017 at Unknown time  . ibuprofen (ADVIL,MOTRIN) 200 MG tablet Take 400 mg by mouth daily as needed for headache.   12/16/2017 at Unknown time  . LORazepam (ATIVAN) 0.5 MG tablet Take 1 tablet (0.5 mg total) by mouth 3 (three) times daily as  needed for anxiety. (Patient taking differently: Take 0.5 mg by mouth 3 (three) times daily. 1 tablet in the morning, 1 tablet in the evening and 2 tablets every night at bedtime) 5 tablet 0 12/17/2017 at Unknown time  . magnesium oxide (MAG-OX) 400 (241.3 Mg) MG tablet Take 1 tablet (400 mg total) by mouth daily. 30 tablet 0 12/16/2017 at Unknown time  . metFORMIN (GLUCOPHAGE) 1000 MG tablet Take 1,000 mg by mouth daily.   12/17/2017 at Unknown time  . metoprolol tartrate (LOPRESSOR) 25 MG tablet Take 25 mg by mouth 2 (two) times daily.   12/17/2017 at 1000 am  . omeprazole (PRILOSEC) 40 MG capsule Take 40 mg by mouth daily.    12/17/2017 at Unknown time  . polyethylene glycol (MIRALAX / GLYCOLAX) packet Take 17 g by mouth daily as needed for mild constipation. 14 each 0 Past Month at Unknown time  . Rivaroxaban (XARELTO) 15 MG TABS tablet Take 15 mg by mouth daily with supper.   12/16/2017 at 1000 pm  . albuterol (PROVENTIL HFA;VENTOLIN HFA) 108 (90 Base) MCG/ACT inhaler Inhale 2 puffs into the lungs every 6 (six) hours as needed for shortness of breath.   prn    Results for orders placed or performed during the hospital encounter of 12/17/17 (from the past 48 hour(s))  CBG monitoring, ED     Status: None   Collection Time: 12/17/17  4:44 PM  Result Value Ref Range   Glucose-Capillary 98 70 - 99 mg/dL  CBC with Differential/Platelet     Status: Abnormal   Collection Time: 12/17/17  5:33 PM  Result Value Ref Range   WBC 9.6 4.0 - 10.5 K/uL   RBC 3.87 3.87 - 5.11 MIL/uL   Hemoglobin 10.0 (L) 12.0 - 15.0 g/dL   HCT 32.9 (L) 36.0 - 46.0 %   MCV 85.0 78.0 - 100.0 fL   MCH 25.8 (L) 26.0 - 34.0 pg   MCHC 30.4 30.0 - 36.0 g/dL   RDW 15.7 (H) 11.5 - 15.5 %   Platelets 452 (H) 150 - 400 K/uL   Neutrophils Relative % 65 %   Neutro Abs 6.3 1.7 - 7.7 K/uL   Lymphocytes Relative 27 %   Lymphs Abs 2.6 0.7 - 4.0 K/uL   Monocytes Relative 8 %   Monocytes Absolute 0.7 0.1 - 1.0 K/uL   Eosinophils Relative  0 %   Eosinophils Absolute 0.0 0.0 - 0.7 K/uL   Basophils Relative 0 %   Basophils Absolute 0.0 0.0 - 0.1 K/uL    Comment: Performed at [facility], [place] [address], [place], [place] [address]  Comprehensive metabolic panel     Status: Abnormal   Collection  Time: 12/17/17  5:33 PM  Result Value Ref Range   Sodium 138 135 - 145 mmol/L   Potassium 3.1 (L) 3.5 - 5.1 mmol/L   Chloride 97 (L) 98 - 111 mmol/L   CO2 27 22 - 32 mmol/L   Glucose, Bld 90 70 - 99 mg/dL   BUN 20 8 - 23 mg/dL   Creatinine, Ser 0.70 0.44 - 1.00 mg/dL   Calcium 9.6 8.9 - 10.3 mg/dL   Total Protein 7.7 6.5 - 8.1 g/dL   Albumin 4.0 3.5 - 5.0 g/dL   AST 26 15 - 41 U/L   ALT 15 0 - 44 U/L   Alkaline Phosphatase 88 38 - 126 U/L   Total Bilirubin 0.5 0.3 - 1.2 mg/dL   GFR calc non Af Amer >60 >60 mL/min   GFR calc Af Amer >60 >60 mL/min    Comment: (NOTE) The eGFR has been calculated using the CKD EPI equation. This calculation has not been validated in all clinical situations. eGFR's persistently <60 mL/min signify possible Chronic Kidney Disease.    Anion gap 14 5 - 15    Comment: Performed at [facility], [place] [address], [place], [place] [address]  Lipase, blood     Status: None   Collection Time: 12/17/17  5:33 PM  Result Value Ref Range   Lipase 26 11 - 51 U/L    Comment: Performed at [facility], [place] [address], [place], [place] [address]  I-stat troponin, ED     Status: None   Collection Time: 12/17/17  5:42 PM  Result Value Ref Range   Troponin i, poc 0.00 0.00 - 0.08 ng/mL   Comment 3            Comment: Due to the release kinetics of cTnI, a negative result within the first hours of the onset of symptoms does not rule out myocardial infarction with certainty. If myocardial infarction is still suspected, repeat the test at appropriate intervals.   Urinalysis, Routine w reflex microscopic     Status: Abnormal   Collection Time: 12/17/17   7:18 PM  Result Value Ref Range   Color, Urine YELLOW YELLOW   APPearance CLEAR CLEAR   Specific Gravity, Urine 1.010 1.005 - 1.030   pH 7.0 5.0 - 8.0   Glucose, UA NEGATIVE NEGATIVE mg/dL   Hgb urine dipstick MODERATE (A) NEGATIVE   Bilirubin Urine NEGATIVE NEGATIVE   Ketones, ur NEGATIVE NEGATIVE mg/dL   Protein, ur NEGATIVE NEGATIVE mg/dL   Nitrite NEGATIVE NEGATIVE   Leukocytes, UA LARGE (A) NEGATIVE   RBC / HPF 0-5 0 - 5 RBC/hpf   WBC, UA 21-50 0 - 5 WBC/hpf   Bacteria, UA RARE (A) NONE SEEN   Squamous Epithelial / LPF 0-5 0 - 5    Comment: Performed at [facility], [place] [address] [place], [place] [address]    Dg Chest 2 View  Result Date: 12/17/2017 CLINICAL DATA:  84 year old female with generalized weakness, nausea vomiting. EXAM: CHEST - 2 VIEW COMPARISON:  Chest radiographs 09/10/2017 and earlier. Lumbar radiographs 11/21/2017. FINDINGS: Semi upright AP and lateral views of the chest. Chronic anterior chest wall cardiac loop recorder. Stable mild cardiomegaly and prior aortic valve replacement. Chronically large lung volumes. No pneumothorax, pulmonary edema, pleural effusion or acute pulmonary opacity. Stable mild L1 compression fracture since July. Abdominal Calcified aortic atherosclerosis. Negative visible bowel gas pattern. No pneumoperitoneum is visible. IMPRESSION: 1.  No acute cardiopulmonary abnormality.  2. Stable mild L1 compression fracture since July. 3.  Aortic Atherosclerosis (ICD10-I70.0). Electronically Signed   By: Genevie Ann M.D.   On: 12/17/2017 17:26   Ct Abdomen Pelvis W Contrast  Result Date: 12/17/2017 CLINICAL DATA:  Generalized weakness, nausea and vomiting for the past 2-3 days. EXAM: CT ABDOMEN AND PELVIS WITH CONTRAST TECHNIQUE: Multidetector CT imaging of the abdomen and pelvis was performed using the standard protocol following bolus administration of intravenous contrast. CONTRAST:  139m ISOVUE-300 IOPAMIDOL (ISOVUE-300) INJECTION  61% COMPARISON:  09/10/2017. FINDINGS: Lower chest: Multiple small nodular densities at the lung bases with an interval decrease in size and number. Hepatobiliary: Cholecystectomy clips. No significant change in intrahepatic and extrahepatic biliary ductal dilatation. No obstructing stone or mass seen. A partially enhancing inferior right lobe liver mass is again demonstrated. Including the poorly defined enhancing components, this measures 1.5 x 1.3 cm on image number 32 series 2. Pancreas: The previously demonstrated 1.4 x 0.8 cm cystic area in the uncinate process measures 1.6 x 0.8 cm in corresponding dimensions on image number 28 series 2. Moderate atrophy of the remainder the pancreas without significant change. Spleen: Normal in size without focal abnormality. Adrenals/Urinary Tract: Adrenal glands are unremarkable. Kidneys are normal, without renal calculi, focal lesion, or hydronephrosis. Bladder is unremarkable. Stomach/Bowel: Interval jejunal intussusception in the left upper abdomen. A 2nd jejunal intussusception is demonstrated in the left lower abdomen at the level of the upper pelvis. No visible mass. No proximal dilatation. Prominent stool in the colon. Normal appearing appendix. Vascular/Lymphatic: Atheromatous arterial calcifications without aneurysm. No enlarged lymph nodes. Reproductive: Small calcified uterine fibroids. No adnexal masses. Other: No abdominal wall hernia or abnormality. No abdominopelvic ascites. Musculoskeletal: Proximal right femoral bone island. Lumbar spine degenerative changes. Interval approximately 35% L2 vertebral body superior endplate compression deformity with sclerosis and mild bony retropulsion. No acute fracture lines are seen. IMPRESSION: 1. Interval jejunal intussusception in the left upper abdomen and 2nd jejunal intussusception in the left lower abdomen. There is no evidence of bowel obstruction or ischemia. 2. Interval approximately 35% L2 vertebral body  superior endplate compression deformity with sclerosis and mild bony retropulsion. No acute fracture. 3. Interval decrease in size and number of small nodular densities at the lung bases, compatible with improving infection or inflammation. 4. Stable post cholecystectomy biliary ductal dilatation. 5. Slowly enlarging probable atypical hemangioma in the inferior right lobe of the liver. 6. No significant change in a cystic lesion in the uncinate process of the pancreas and diffuse pancreatic atrophy. Electronically Signed   By: SClaudie ReveringM.D.   On: 12/17/2017 21:01    Review of Systems  Constitutional: Negative.   HENT: Negative.   Eyes: Negative.   Respiratory: Negative.   Cardiovascular: Negative.   Gastrointestinal: Positive for abdominal pain, diarrhea, nausea and vomiting.  Genitourinary: Negative.   Musculoskeletal: Negative.   Skin: Negative.   Neurological: Negative.   Endo/Heme/Allergies: Bruises/bleeds easily.  Psychiatric/Behavioral: Positive for memory loss (chronic).   Blood pressure (!) 161/73, pulse 74, temperature 98.5 F (36.9 C), temperature source Oral, resp. rate 18, height 5' 7"  (1.702 m), weight 48.3 kg, SpO2 100 %. Physical Exam  Constitutional: She is oriented to person, place, and time. She appears well-developed. She appears distressed.  Very thin  HENT:  Head: Normocephalic and atraumatic.  Dried vomit on chin.  Eyes: Pupils are equal, round, and reactive to light. Conjunctivae are normal. No scleral icterus.  Neck: Neck supple. No tracheal deviation present. No thyromegaly present.  Cardiovascular: Normal rate and regular rhythm.  Murmur (3/6 SM) heard. Respiratory: Effort normal and breath sounds normal. No respiratory distress.  GI: Soft. Bowel sounds are normal. She exhibits no distension and no mass. There is no tenderness. There is no rebound and no guarding.  Musculoskeletal: She exhibits no edema, tenderness or deformity.  Neurological: She is  alert and oriented to person, place, and time. Coordination normal.  Skin: Skin is warm and dry. No rash noted. She is not diaphoretic. No erythema. No pallor.  Psychiatric: She has a normal mood and affect. Her behavior is normal. Judgment and thought content normal.    Assessment/Plan: N/v/d UTI Intussusception on CT scan  Suspect intussusception a temporary condition seen on CT due to numerous episodes of N/V.  We sometimes see this in a single snapshot in time.  She has a totally benign abdomen with no pain at this point.    I suspect that the n/v is due to UTI or viral gastroenteritis given the fact that she lives at assisted living facility in close contact with numerous older patients.    Recommend diet as tolerated with support for hydration.  Consider follow up plain films if any obstructive symptoms occur.    Angela Hines 12/17/2017, 11:30 PM

## 2017-12-17 NOTE — ED Triage Notes (Signed)
Patient is from home and transported via [facility]. Patient is complaining of generalized weakness with nausea and vomiting for the last 2-3 days. EMS reported patient went to an urgent care earlier today for symptoms. Last [facility] records indicate patient was seen by Daune Perch, NP for chronic conditions and falls. Patient was given [facility] 4mg  enroute. Last vomit was at 13:30 today.

## 2017-12-18 ENCOUNTER — Other Ambulatory Visit: Payer: Self-pay

## 2017-12-18 ENCOUNTER — Encounter (HOSPITAL_COMMUNITY): Payer: Self-pay

## 2017-12-18 LAB — CBC
HCT: 28.6 % — ABNORMAL LOW (ref 36.0–46.0)
Hemoglobin: 8.5 g/dL — ABNORMAL LOW (ref 12.0–15.0)
MCH: 25.6 pg — ABNORMAL LOW (ref 26.0–34.0)
MCHC: 29.7 g/dL — AB (ref 30.0–36.0)
MCV: 86.1 fL (ref 78.0–100.0)
PLATELETS: 369 10*3/uL (ref 150–400)
RBC: 3.32 MIL/uL — ABNORMAL LOW (ref 3.87–5.11)
RDW: 15.7 % — AB (ref 11.5–15.5)
WBC: 8.4 10*3/uL (ref 4.0–10.5)

## 2017-12-18 LAB — GLUCOSE, CAPILLARY
GLUCOSE-CAPILLARY: 113 mg/dL — AB (ref 70–99)
Glucose-Capillary: 134 mg/dL — ABNORMAL HIGH (ref 70–99)
Glucose-Capillary: 147 mg/dL — ABNORMAL HIGH (ref 70–99)
Glucose-Capillary: 76 mg/dL (ref 70–99)
Glucose-Capillary: 78 mg/dL (ref 70–99)
Glucose-Capillary: 88 mg/dL (ref 70–99)

## 2017-12-18 LAB — BASIC METABOLIC PANEL
Anion gap: 8 (ref 5–15)
BUN: 17 mg/dL (ref 8–23)
CHLORIDE: 104 mmol/L (ref 98–111)
CO2: 27 mmol/L (ref 22–32)
CREATININE: 0.65 mg/dL (ref 0.44–1.00)
Calcium: 8.9 mg/dL (ref 8.9–10.3)
GFR calc Af Amer: >60 mL/min (ref >60)
Glucose, Bld: 85 mg/dL (ref 70–99)
Potassium: 3.2 mmol/L — ABNORMAL LOW (ref 3.5–5.1)
SODIUM: 139 mmol/L (ref 135–145)

## 2017-12-18 LAB — BRAIN NATRIURETIC PEPTIDE: B Natriuretic Peptide: 425.4 pg/mL — ABNORMAL HIGH (ref 0.0–100.0)

## 2017-12-18 LAB — MAGNESIUM: Magnesium: 1.9 mg/dL (ref 1.7–2.4)

## 2017-12-18 MED ORDER — BOOST / RESOURCE BREEZE PO LIQD CUSTOM
1.0000 | Freq: Three times a day (TID) | ORAL | Status: DC
  Administered 2017-12-18 – 2017-12-20 (×7): 1 via ORAL

## 2017-12-18 MED ORDER — SODIUM CHLORIDE 0.9 % IV SOLN
1.0000 g | Freq: Three times a day (TID) | INTRAVENOUS | Status: DC
  Administered 2017-12-18 – 2017-12-20 (×8): 1 g via INTRAVENOUS
  Filled 2017-12-18 (×9): qty 1

## 2017-12-18 MED ORDER — RIVAROXABAN 15 MG PO TABS
15.0000 mg | ORAL_TABLET | Freq: Every day | ORAL | Status: DC
  Administered 2017-12-18 – 2017-12-19 (×2): 15 mg via ORAL
  Filled 2017-12-18 (×2): qty 1

## 2017-12-18 NOTE — Evaluation (Signed)
Physical Therapy Evaluation Patient Details Name: Angela Hines MRN: [id number] DOB: [birth year] Today's Date: 12/18/2017   History of Present Illness  84 y.o. female with medical history significant of A fib on xarelto,  hypertension, hyperlipidemia, diabetes mellitus, COPD, GERD, depression, anxiety, s/p of TAVR with bioprosthetic valve, atrial fibrillation on Xarelto, pacemaker placement, CHF with EF of 40%, recent admission for Tylenol overdose, who presents with nausea, vomiting.  Clinical Impression  Pt admitted with above diagnosis. Pt currently with functional limitations due to the deficits listed below (see PT Problem List). Pt ambulated 150' with RW with mild unsteadiness, but no overt loss of balance. Family reports h/o 7 falls in past 6 months. Pt uses her rollator inconsistently at her ILF. She has not fallen while using her rollator. Encouraged pt to use rollator at all times. Pt will benefit from skilled PT to increase their independence and safety with mobility to allow discharge to the venue listed below.       Follow Up Recommendations Home health PT to address balance    Equipment Recommendations  None recommended by PT    Recommendations for Other Services       Precautions / Restrictions Precautions Precautions: Fall Precaution Comments: 7 falls in past 6 months Restrictions Weight Bearing Restrictions: No      Mobility  Bed Mobility Overal bed mobility: Modified Independent             General bed mobility comments: HOB up 30*  Transfers Overall transfer level: Needs assistance Equipment used: Rolling walker (2 wheeled) Transfers: Sit to/from Stand Sit to Stand: Supervision         General transfer comment: supervision for safety  Ambulation/Gait Ambulation/Gait assistance: Supervision Gait Distance (Feet): 150 Feet Assistive device: Rolling walker (2 wheeled) Gait Pattern/deviations: Step-through pattern;Decreased stride length Gait  velocity: WFL   General Gait Details: mild unsteadiness with turns but no overt loss of balance  Stairs            Wheelchair Mobility    Modified Rankin (Stroke Patients Only)       Balance Overall balance assessment: History of Falls;Needs assistance   Sitting balance-Leahy Scale: Good       Standing balance-Leahy Scale: Fair                               Pertinent Vitals/Pain Pain Assessment: No/denies pain    Home Living Family/patient expects to be discharged to:: Private residence Living Arrangements: Alone Available Help at Discharge: Available PRN/intermittently;Family Type of Home: Independent living facility Home Access: Elevator     Home Layout: One level Home Equipment: Grab bars - tub/shower;Grab bars - toilet;Tub bench;Walker - 4 wheels      Prior Function Level of Independence: Independent with assistive device(s)         Comments: uses rollator but not at all times (h/o 7 falls in 6 months, none occurred while using RW, pt stated her legs "give out"), can walk to dining room, independent ADLs     Hand Dominance   Dominant Hand: Right    Extremity/Trunk Assessment   Upper Extremity Assessment Upper Extremity Assessment: Overall WFL for tasks assessed    Lower Extremity Assessment Lower Extremity Assessment: Overall WFL for tasks assessed(B knee ext +4/5, pt reports intermittent tingling in B feet, intact to light touch)    Cervical / Trunk Assessment Cervical / Trunk Assessment: Normal  Communication   Communication: No difficulties  Cognition Arousal/Alertness: Awake/alert Behavior During Therapy: WFL for tasks assessed/performed Overall Cognitive Status: Within Functional Limits for tasks assessed                                        General Comments      Exercises     Assessment/Plan    PT Assessment Patient needs continued PT services  PT Problem List Decreased balance       PT  Treatment Interventions Balance training;Therapeutic exercise;Patient/family education    PT Goals (Current goals can be found in the Care Plan section)  Acute Rehab PT Goals Patient Stated Goal: stop falling PT Goal Formulation: With patient/family Time For Goal Achievement: 01/01/18 Potential to Achieve Goals: Good    Frequency Min 3X/week   Barriers to discharge        Co-evaluation               AM-PAC PT "6 Clicks" Daily Activity  Outcome Measure Difficulty turning over in bed (including adjusting bedclothes, sheets and blankets)?: None Difficulty moving from lying on back to sitting on the side of the bed? : None Difficulty sitting down on and standing up from a chair with arms (e.g., wheelchair, bedside commode, etc,.)?: A Little Help needed moving to and from a bed to chair (including a wheelchair)?: A Little Help needed walking in hospital room?: A Little Help needed climbing 3-5 steps with a railing? : A Little 6 Click Score: 20    End of Session Equipment Utilized During Treatment: Gait belt Activity Tolerance: Patient tolerated treatment well Patient left: in chair;with call bell/phone within reach;with chair alarm set Nurse Communication: Mobility status PT Visit Diagnosis: History of falling (Z91.81);Repeated falls (R29.6);Difficulty in walking, not elsewhere classified (R26.2)    Time: 8832-5498 PT Time Calculation (min) (ACUTE ONLY): 22 min   Charges:   PT Evaluation $PT Eval Low Complexity: 1 Low            Blondell Reveal Kistler 12/18/2017, 2:39 PM [phone]

## 2017-12-18 NOTE — Progress Notes (Signed)
Taking over care of patient, denies any needs at this time will continue to monitor.

## 2017-12-18 NOTE — Progress Notes (Signed)
PROGRESS NOTE    Angela Hines  EPP:[id number] DOB: 10-15-1932 DOA: 12/17/2017 PCP: Kelton Pillar, MD   Brief Narrative: Angela Hines is a 84 y.o. female with medical history significant of A fib on xarelto, hypertension, hyperlipidemia, diabetes mellitus, COPD, GERD, depression, anxiety, s/p of TAVR with bioprosthetic valve, atrial fibrillation on Xarelto, pacemaker placement, CHF with EF of 40%, recent admission for Tylenol overdose. She presented secondary to nausea and vomiting with initial concern for intussusception. General surgery consulted who feels this is secondary to gastroenteritis.    Assessment & Plan:   Principal Problem:   Nausea & vomiting Active Problems:   COPD (chronic obstructive pulmonary disease) (HCC)   Gastroesophageal reflux disease without esophagitis   Hypertension   AF (paroxysmal atrial fibrillation) (HCC)   S/P AVR (aortic valve replacement)   Protein-calorie malnutrition, severe   Chronic systolic CHF (congestive heart failure) (HCC)   Diabetes mellitus without complication (HCC)   Depression with anxiety   UTI (urinary tract infection)   Hypokalemia   Nausea and vomiting Concern for jejunal intussusception. General surgery consulted. No concern from general surgery. -General surgery recommendations: advancing diet -Continue IV fluids in setting of nausea/vomiting -Continue Zofran prn  Diabetes mellitus, On metformin as an outpatient. -SSI  Urinary tract infection Started on Aztreonam -Continue Aztreonam -Urine culture pending  Anemia Acute on chronic. In setting of hydration. No bleeding. -Repeat CBC in AM -Watch in setting of Xarelto  COPD Stable  GERD Stable. -Continue Protonix  Essential hypertension -Continue amlodipine -Continue hydralazine prn  Atrial fibrillation Currently on anticoagulation. Initially held in setting of anticipated surgery. Hemoglobin down slightly without evidence of hematochezia -Restart  Xarelto -Continue metoprolol and amiodarone  S/p aortic valve replacement Stable  Chronic systolic heart failure Compensated -Will watch in setting of IV hydration. Lasix held  Hypokalemia Repleted.  Depression/anxiety -Continue Wellbutrin  Severe protein malnutrition -Protein supplementation when tolerating diet well   DVT prophylaxis: Xarelto Code Status:   Code Status: DNR Family Communication: None at bedside Disposition Plan: Discharge back to ILF likely in 24 hours. PT consulted   Consultants:   General surgery  Procedures:   None  Antimicrobials:  Aztreonam (8/26>>    Subjective: Feeling better today. No diarrhea. No bloody stools  Objective: Vitals:   12/17/17 2312 12/17/17 2313 12/18/17 0630 12/18/17 1107  BP:  (!) 161/73 (!) 108/57 126/71  Pulse:  74 (!) 56 70  Resp:  18 18   Temp:  98.5 F (36.9 C) 98.2 F (36.8 C)   TempSrc:  Oral Oral   SpO2:  100% 95% 97%  Weight: 48.3 kg     Height: 5\' 7"  (1.702 m)       Intake/Output Summary (Last 24 hours) at 12/18/2017 1256 Last data filed at 12/18/2017 1140 Gross per 24 hour  Intake 1640 ml  Output 2 ml  Net 1638 ml   Filed Weights   12/17/17 1641 12/17/17 2312  Weight: 50.3 kg 48.3 kg    Examination:  General exam: Appears calm and comfortable Respiratory system: Clear to auscultation. Respiratory effort normal. Cardiovascular system: S1 & S2 heard, RRR. Gastrointestinal system: Abdomen is nondistended, soft and nontender. No organomegaly or masses felt. Normal bowel sounds heard. Central nervous system: Alert and oriented. No focal neurological deficits. Extremities: No edema. No calf tenderness Skin: No cyanosis. No rashes Psychiatry: Judgement and insight appear normal. Mood & affect appropriate.     Data Reviewed: I have personally reviewed following labs and imaging studies  CBC: Recent  Labs  Lab 12/17/17 1733 12/18/17 0429  WBC 9.6 8.4  NEUTROABS 6.3  --   HGB 10.0*  8.5*  HCT 32.9* 28.6*  MCV 85.0 86.1  PLT 452* 116   Basic Metabolic Panel: Recent Labs  Lab 12/17/17 1733 12/18/17 0429  NA 138 139  K 3.1* 3.2*  CL 97* 104  CO2 27 27  GLUCOSE 90 85  BUN 20 17  CREATININE 0.70 0.65  CALCIUM 9.6 8.9  MG  --  1.9   GFR: Estimated Creatinine Clearance: 40.6 mL/min (by C-G formula based on SCr of 0.65 mg/dL). Liver Function Tests: Recent Labs  Lab 12/17/17 1733  AST 26  ALT 15  ALKPHOS 88  BILITOT 0.5  PROT 7.7  ALBUMIN 4.0   Recent Labs  Lab 12/17/17 1733  LIPASE 26   No results for input(s): AMMONIA in the last 168 hours. Coagulation Profile: No results for input(s): INR, PROTIME in the last 168 hours. Cardiac Enzymes: No results for input(s): CKTOTAL, CKMB, CKMBINDEX, TROPONINI in the last 168 hours. BNP (last 3 results) No results for input(s): PROBNP in the last 8760 hours. HbA1C: No results for input(s): HGBA1C in the last 72 hours. CBG: Recent Labs  Lab 12/17/17 1644 12/18/17 0047 12/18/17 0752 12/18/17 1137  GLUCAP 98 88 78 113*   Lipid Profile: No results for input(s): CHOL, HDL, LDLCALC, TRIG, CHOLHDL, LDLDIRECT in the last 72 hours. Thyroid Function Tests: No results for input(s): TSH, T4TOTAL, FREET4, T3FREE, THYROIDAB in the last 72 hours. Anemia Panel: No results for input(s): VITAMINB12, FOLATE, FERRITIN, TIBC, IRON, RETICCTPCT in the last 72 hours. Sepsis Labs: No results for input(s): PROCALCITON, LATICACIDVEN in the last 168 hours.  No results found for this or any previous visit (from the past 240 hour(s)).       Radiology Studies: Dg Chest 2 View  Result Date: 12/17/2017 CLINICAL DATA:  84 year old female with generalized weakness, nausea vomiting. EXAM: CHEST - 2 VIEW COMPARISON:  Chest radiographs 09/10/2017 and earlier. Lumbar radiographs 11/21/2017. FINDINGS: Semi upright AP and lateral views of the chest. Chronic anterior chest wall cardiac loop recorder. Stable mild cardiomegaly and  prior aortic valve replacement. Chronically large lung volumes. No pneumothorax, pulmonary edema, pleural effusion or acute pulmonary opacity. Stable mild L1 compression fracture since July. Abdominal Calcified aortic atherosclerosis. Negative visible bowel gas pattern. No pneumoperitoneum is visible. IMPRESSION: 1.  No acute cardiopulmonary abnormality. 2. Stable mild L1 compression fracture since July. 3.  Aortic Atherosclerosis (ICD10-I70.0). Electronically Signed   By: Genevie Ann M.D.   On: 12/17/2017 17:26   Ct Abdomen Pelvis W Contrast  Result Date: 12/17/2017 CLINICAL DATA:  Generalized weakness, nausea and vomiting for the past 2-3 days. EXAM: CT ABDOMEN AND PELVIS WITH CONTRAST TECHNIQUE: Multidetector CT imaging of the abdomen and pelvis was performed using the standard protocol following bolus administration of intravenous contrast. CONTRAST:  14mL ISOVUE-300 IOPAMIDOL (ISOVUE-300) INJECTION 61% COMPARISON:  09/10/2017. FINDINGS: Lower chest: Multiple small nodular densities at the lung bases with an interval decrease in size and number. Hepatobiliary: Cholecystectomy clips. No significant change in intrahepatic and extrahepatic biliary ductal dilatation. No obstructing stone or mass seen. A partially enhancing inferior right lobe liver mass is again demonstrated. Including the poorly defined enhancing components, this measures 1.5 x 1.3 cm on image number 32 series 2. Pancreas: The previously demonstrated 1.4 x 0.8 cm cystic area in the uncinate process measures 1.6 x 0.8 cm in corresponding dimensions on image number 28 series 2. Moderate atrophy  of the remainder the pancreas without significant change. Spleen: Normal in size without focal abnormality. Adrenals/Urinary Tract: Adrenal glands are unremarkable. Kidneys are normal, without renal calculi, focal lesion, or hydronephrosis. Bladder is unremarkable. Stomach/Bowel: Interval jejunal intussusception in the left upper abdomen. A 2nd jejunal  intussusception is demonstrated in the left lower abdomen at the level of the upper pelvis. No visible mass. No proximal dilatation. Prominent stool in the colon. Normal appearing appendix. Vascular/Lymphatic: Atheromatous arterial calcifications without aneurysm. No enlarged lymph nodes. Reproductive: Small calcified uterine fibroids. No adnexal masses. Other: No abdominal wall hernia or abnormality. No abdominopelvic ascites. Musculoskeletal: Proximal right femoral bone island. Lumbar spine degenerative changes. Interval approximately 35% L2 vertebral body superior endplate compression deformity with sclerosis and mild bony retropulsion. No acute fracture lines are seen. IMPRESSION: 1. Interval jejunal intussusception in the left upper abdomen and 2nd jejunal intussusception in the left lower abdomen. There is no evidence of bowel obstruction or ischemia. 2. Interval approximately 35% L2 vertebral body superior endplate compression deformity with sclerosis and mild bony retropulsion. No acute fracture. 3. Interval decrease in size and number of small nodular densities at the lung bases, compatible with improving infection or inflammation. 4. Stable post cholecystectomy biliary ductal dilatation. 5. Slowly enlarging probable atypical hemangioma in the inferior right lobe of the liver. 6. No significant change in a cystic lesion in the uncinate process of the pancreas and diffuse pancreatic atrophy. Electronically Signed   By: Claudie Revering M.D.   On: 12/17/2017 21:01        Scheduled Meds: . amiodarone  100 mg Oral Daily  . amLODipine  2.5 mg Oral Daily  . buPROPion  150 mg Oral Daily  . gabapentin  600 mg Oral QHS  . insulin aspart  0-5 Units Subcutaneous QHS  . insulin aspart  0-9 Units Subcutaneous TID WC  . LORazepam  0.5 mg Oral BID  . LORazepam  1 mg Oral QHS  . magnesium oxide  400 mg Oral Daily  . metoprolol tartrate  25 mg Oral BID  . ondansetron (ZOFRAN) IV  4 mg Intravenous Once  .  pantoprazole  40 mg Oral Daily  . rivaroxaban  15 mg Oral Q supper   Continuous Infusions: . aztreonam 1 g (12/18/17 0828)     LOS: 1 day     Cordelia Poche, MD [facility] 12/18/2017, 12:56 PM Pager: [phone]  If 7PM-7AM, please contact night-coverage [email or web address] 12/18/2017, 12:56 PM

## 2017-12-18 NOTE — Progress Notes (Signed)
Pharmacy Antibiotic Note  Angela Hines is a 84 y.o. female admitted on 12/17/2017 with UTI.  Pharmacy has been consulted for aztreonam dosing.  Plan: Aztreonam 2gm iv x1, then 1gm iv q8hr  Height: 5\' 7"  (170.2 cm) Weight: 106 lb 6.4 oz (48.3 kg) IBW/kg (Calculated) : 61.6  Temp (24hrs), Avg:98.4 F (36.9 C), Min:98.3 F (36.8 C), Max:98.5 F (36.9 C)  Recent Labs  Lab 12/17/17 1733 12/18/17 0429  WBC 9.6 8.4  CREATININE 0.70  --     Estimated Creatinine Clearance: 40.6 mL/min (by C-G formula based on SCr of 0.7 mg/dL).    Allergies  Allergen Reactions  . Clonidine Swelling and Other (See Comments)    "head swelling"  . Captopril Swelling  . Codeine Nausea And Vomiting and Other (See Comments)    GI Intolerance   . Ampicillin Rash  . Ceclor [Cefaclor] Rash    Antimicrobials this admission: Aztreonam 12/18/2017 >>  Dose adjustments this admission: -  Microbiology results: -  Thank you for allowing pharmacy to be a part of this patient's care.  Nani Skillern Crowford 12/18/2017 5:08 AM

## 2017-12-18 NOTE — Progress Notes (Signed)
Initial Nutrition Assessment  DOCUMENTATION CODES:   Underweight, Severe malnutrition in context of chronic illness  INTERVENTION:    Boost Breeze po TID, each supplement provides 250 kcal and 9 grams of protein  Ensure Enlive po BID, each supplement provides 350 kcal and 20 grams of protein once diet is advanced to full liquids.   NUTRITION DIAGNOSIS:   Severe Malnutrition related to chronic illness(COPD) as evidenced by severe fat depletion, severe muscle depletion.  GOAL:   Patient will meet greater than or equal to 90% of their needs  MONITOR:   PO intake, Supplement acceptance, Weight trends, Labs, Diet advancement  REASON FOR ASSESSMENT:   Other (Comment)(Low BMI)    ASSESSMENT:   Patient with PMH significant for Afib, HTN, HLD, DM, COPD, GERD, depression, anxiety, s/p ofTAVRwithbioprosthetic valve, s/p pacemaker placement, CHF Recently admitted for Tylenol overdose. Presents this admission with nausea/vomiting with initial concern for intussusception.    Surgery consulted- no surgical intervention warranted. Pt's diet advanced to clears today.   Pt reports having an overall loss in appetite for the last year due to what she thinks is her aging process. States she typically consumes a smaller breakfast that consists of toast with coffee, lunch is often a sandwich or skipped, and dinner is her largest, meal that consists of a meat/vegetable/grain. Pt reports her PCP told her to start drinking 3 ice cream milkshakes a day, but she is only able to drink two. She has Ensure once a day if she remembers. Over the last week intake has dropped to bites due to ongoing nausea/vomting. RD observed beef broth and jello at bedside 75% completed. Pt denies any issues with swallowing or nausea with her first meal. Discussed the importance of protein intake for preservation of lean body mass. Encouraged pt to increase Ensure to 2-3 times daily to maximize calories and protein.   Pt  endorses a UBW of 140 lb, th last time being at that weight a year ago. Records indicate pt weighed 111 lb on 10/23/17 and 106 lb this admission (4.5% wt loss in one month, insignificant for time frame). Nutrition-Focused physical exam completed.   Medications reviewed and include: Mg-ox, zofran Labs reviewed: K 3.2 (L)  NUTRITION - FOCUSED PHYSICAL EXAM:    Most Recent Value  Orbital Region  Mild depletion  Upper Arm Region  Severe depletion  Thoracic and Lumbar Region  Severe depletion  Buccal Region  Moderate depletion  Temple Region  Severe depletion  Clavicle Bone Region  Severe depletion  Clavicle and Acromion Bone Region  Severe depletion  Scapular Bone Region  Severe depletion  Dorsal Hand  Severe depletion  Patellar Region  Severe depletion  Anterior Thigh Region  Severe depletion  Posterior Calf Region  Severe depletion  Edema (RD Assessment)  None  Hair  Reviewed  Eyes  Reviewed  Mouth  Reviewed  Skin  Reviewed  Nails  Reviewed     Diet Order:   Diet Order            Diet clear liquid Room service appropriate? Yes; Fluid consistency: Thin  Diet effective now              EDUCATION NEEDS:   Education needs have been addressed  Skin:  Skin Assessment: Reviewed RN Assessment  Last BM:  PTA  Height:   Ht Readings from Last 1 Encounters:  12/17/17 5\' 7"  (1.702 m)    Weight:   Wt Readings from Last 1 Encounters:  12/17/17 48.3  kg    Ideal Body Weight:  61.4 kg  BMI:  Body mass index is 16.66 kg/m.  Estimated Nutritional Needs:   Kcal:  1450-1650 kcal  Protein:  70-85 grams  Fluid:  >/= 1.4 L/day    Mariana Single RD, LDN Clinical Nutrition Pager # - [phone]

## 2017-12-18 NOTE — Progress Notes (Addendum)
CC:  Nausea, vomiting, diarrhea  Subjective: No nausea, no vomiting and no diarrhea, she says she didn't have diarrhea.  She is hungry and wants to eat.  Abdomen is soft and she denies any pain.    Objective: Vital signs in last 24 hours: Temp:  [98.2 F (36.8 C)-98.5 F (36.9 C)] 98.2 F (36.8 C) (08/27 0630) Pulse Rate:  [56-81] 56 (08/27 0630) Resp:  [10-19] 18 (08/27 0630) BP: (108-161)/(57-90) 108/57 (08/27 0630) SpO2:  [95 %-100 %] 95 % (08/27 0630) Weight:  [48.3 kg-50.3 kg] 48.3 kg (08/26 2312)  1080 IV recorded Urine x 2 Afebrile, VSS K+ 3.2 WBC 8.4 H/H  10/32 >> 8.5/28.6 CXR 8/26:  No acute changes/L1 compression fx stable CT:  Prior cholecystectomy, Pancreas; 1.4 x 0.8 cm cystic area in the uncinate process measures 1.6 x 0.8 cm, stable; Interval jejunal intussusception in the left upper abdomen and 2nd jejunal intussusception in the left lower abdomen. There is no evidence of bowel obstruction or ischemia.  Intake/Output from previous day: 08/26 0701 - 08/27 0700 In: 1080 [IV Piggyback:1080] Out: 2 [Urine:2] Intake/Output this shift: No intake/output data recorded.  General appearance: alert, cooperative and no distress Resp: clear to auscultation bilaterally GI: soft, non-tender; bowel sounds normal; no masses,  no organomegaly and she says she has issues with constipation.    Lab Results:  Recent Labs    12/17/17 1733 12/18/17 0429  WBC 9.6 8.4  HGB 10.0* 8.5*  HCT 32.9* 28.6*  PLT 452* 369    BMET Recent Labs    12/17/17 1733 12/18/17 0429  NA 138 139  K 3.1* 3.2*  CL 97* 104  CO2 27 27  GLUCOSE 90 85  BUN 20 17  CREATININE 0.70 0.65  CALCIUM 9.6 8.9   PT/INR No results for input(s): LABPROT, INR in the last 72 hours.  Recent Labs  Lab 12/17/17 1733  AST 26  ALT 15  ALKPHOS 88  BILITOT 0.5  PROT 7.7  ALBUMIN 4.0     Lipase     Component Value Date/Time   LIPASE 26 12/17/2017 1733     Medications: . amiodarone   100 mg Oral Daily  . amLODipine  2.5 mg Oral Daily  . buPROPion  150 mg Oral Daily  . gabapentin  600 mg Oral QHS  . insulin aspart  0-5 Units Subcutaneous QHS  . insulin aspart  0-9 Units Subcutaneous TID WC  . LORazepam  0.5 mg Oral BID  . LORazepam  1 mg Oral QHS  . magnesium oxide  400 mg Oral Daily  . metoprolol tartrate  25 mg Oral BID  . ondansetron (ZOFRAN) IV  4 mg Intravenous Once  . pantoprazole  40 mg Oral Daily   . aztreonam 1 g (12/18/17 0828)   Anti-infectives (From admission, onward)   Start     Dose/Rate Route Frequency Ordered Stop   12/18/17 0800  aztreonam (AZACTAM) 1 g in sodium chloride 0.9 % 100 mL IVPB     1 g 200 mL/hr over 30 Minutes Intravenous Every 8 hours 12/18/17 0509     12/17/17 2230  aztreonam (AZACTAM) 2 g in sodium chloride 0.9 % 100 mL IVPB     2 g 200 mL/hr over 30 Minutes Intravenous  Once 12/17/17 2220 12/18/17 0154   12/17/17 2130  ciprofloxacin (CIPRO) IVPB 400 mg  Status:  Discontinued     400 mg 200 mL/hr over 60 Minutes Intravenous  Once 12/17/17 2115 12/17/17 2133  Assessment/Plan Mild dementia AF - on Xarelto Hx CHF/PTVP COPD Hx diabetes - Metformin at home Hypertension Dyslipidemia ? Constipation   Nausea, vomiting, diarrhea Possible intussusception on CT  FEN:No IV fluids listed, NPO =>> clears this AM. ID:  Aztreoman 8/26 =>> day 2 DVT:  SCD Follow up:  TBD  Plan:  I will give her some clears and see how she does.  Get PT to work with her and be sure she is stable.  Urine and blood cultures pending.   LOS: 1 day    Angela Hines 12/18/2017 [phone]

## 2017-12-19 DIAGNOSIS — I5022 Chronic systolic (congestive) heart failure: Secondary | ICD-10-CM

## 2017-12-19 DIAGNOSIS — J418 Mixed simple and mucopurulent chronic bronchitis: Secondary | ICD-10-CM

## 2017-12-19 DIAGNOSIS — K219 Gastro-esophageal reflux disease without esophagitis: Secondary | ICD-10-CM

## 2017-12-19 DIAGNOSIS — E119 Type 2 diabetes mellitus without complications: Secondary | ICD-10-CM

## 2017-12-19 LAB — CBC
HEMATOCRIT: 26.4 % — AB (ref 36.0–46.0)
HEMOGLOBIN: 7.9 g/dL — AB (ref 12.0–15.0)
MCH: 25.7 pg — ABNORMAL LOW (ref 26.0–34.0)
MCHC: 29.9 g/dL — ABNORMAL LOW (ref 30.0–36.0)
MCV: 86 fL (ref 78.0–100.0)
Platelets: 325 10*3/uL (ref 150–400)
RBC: 3.07 MIL/uL — ABNORMAL LOW (ref 3.87–5.11)
RDW: 15.8 % — ABNORMAL HIGH (ref 11.5–15.5)
WBC: 6.6 10*3/uL (ref 4.0–10.5)

## 2017-12-19 LAB — BASIC METABOLIC PANEL
ANION GAP: 6 (ref 5–15)
BUN: 21 mg/dL (ref 8–23)
CHLORIDE: 108 mmol/L (ref 98–111)
CO2: 26 mmol/L (ref 22–32)
Calcium: 8.8 mg/dL — ABNORMAL LOW (ref 8.9–10.3)
Creatinine, Ser: 0.81 mg/dL (ref 0.44–1.00)
GFR calc Af Amer: >60 mL/min (ref >60)
Glucose, Bld: 89 mg/dL (ref 70–99)
POTASSIUM: 3.4 mmol/L — AB (ref 3.5–5.1)
SODIUM: 140 mmol/L (ref 135–145)

## 2017-12-19 LAB — URINE CULTURE

## 2017-12-19 LAB — GLUCOSE, CAPILLARY
Glucose-Capillary: 101 mg/dL — ABNORMAL HIGH (ref 70–99)
Glucose-Capillary: 100 mg/dL — ABNORMAL HIGH (ref 70–99)
Glucose-Capillary: 170 mg/dL — ABNORMAL HIGH (ref 70–99)
Glucose-Capillary: 124 mg/dL — ABNORMAL HIGH (ref 70–99)

## 2017-12-19 MED ORDER — LACTULOSE 10 GM/15ML PO SOLN
10.0000 g | ORAL | Status: AC
  Administered 2017-12-19: 10 g via ORAL
  Filled 2017-12-19: qty 30

## 2017-12-19 MED ORDER — POTASSIUM CHLORIDE CRYS ER 20 MEQ PO TBCR
40.0000 meq | EXTENDED_RELEASE_TABLET | Freq: Once | ORAL | Status: AC
  Administered 2017-12-19: 40 meq via ORAL
  Filled 2017-12-19: qty 2

## 2017-12-19 MED ORDER — PSYLLIUM 95 % PO PACK
1.0000 | PACK | Freq: Two times a day (BID) | ORAL | Status: DC
  Administered 2017-12-19 – 2017-12-20 (×3): 1 via ORAL
  Filled 2017-12-19 (×3): qty 1

## 2017-12-19 MED ORDER — FUROSEMIDE 20 MG PO TABS
20.0000 mg | ORAL_TABLET | Freq: Every day | ORAL | Status: DC
  Administered 2017-12-19 – 2017-12-20 (×2): 20 mg via ORAL
  Filled 2017-12-19 (×2): qty 1

## 2017-12-19 MED ORDER — GLYCERIN (LAXATIVE) 2.1 G RE SUPP
1.0000 | Freq: Once | RECTAL | Status: AC
  Administered 2017-12-19: 1 via RECTAL
  Filled 2017-12-19: qty 1

## 2017-12-19 NOTE — Progress Notes (Signed)
PF:[id number], vomiting, diarrhea   Subjective: She says she feels constipated, and I did a rectal exam to be sure she was not impacted.  She is not, but she has some hard stool that I could not removed because it was fairly high.    Objective: Vital signs in last 24 hours: Temp:  [98.4 F (36.9 C)-98.8 F (37.1 C)] 98.4 F (36.9 C) (08/28 0506) Pulse Rate:  [57-70] 60 (08/28 0506) Resp:  [16] 16 (08/28 0506) BP: (109-126)/(56-71) 116/61 (08/28 0506) SpO2:  [96 %-98 %] 98 % (08/28 0506) Last BM Date: 12/15/17 1600 Po 300 IV 200 urine recorded Afebrile, VSS K+ 3.4 H/H trending down Intake/Output from previous day: 08/27 0701 - 08/28 0700 In: 1900 [P.O.:1600; IV Piggyback:300] Out: 200 [Urine:200] Intake/Output this shift: No intake/output data recorded.  General appearance: alert, cooperative and no distress Resp: clear to auscultation bilaterally GI: soft, non-tender; bowel sounds normal; no masses,  no organomegaly and Rectal exam shows some hard stool, but its a rather small amount, most of the vault is empty.    Lab Results:  Recent Labs    12/18/17 0429 12/19/17 0418  WBC 8.4 6.6  HGB 8.5* 7.9*  HCT 28.6* 26.4*  PLT 369 325    BMET Recent Labs    12/18/17 0429 12/19/17 0418  NA 139 140  K 3.2* 3.4*  CL 104 108  CO2 27 26  GLUCOSE 85 89  BUN 17 21  CREATININE 0.65 0.81  CALCIUM 8.9 8.8*   PT/INR No results for input(s): LABPROT, INR in the last 72 hours.  Recent Labs  Lab 12/17/17 1733  AST 26  ALT 15  ALKPHOS 88  BILITOT 0.5  PROT 7.7  ALBUMIN 4.0     Lipase     Component Value Date/Time   LIPASE 26 12/17/2017 1733     Medications: . amiodarone  100 mg Oral Daily  . amLODipine  2.5 mg Oral Daily  . buPROPion  150 mg Oral Daily  . feeding supplement  1 Container Oral TID BM  . gabapentin  600 mg Oral QHS  . insulin aspart  0-5 Units Subcutaneous QHS  . insulin aspart  0-9 Units Subcutaneous TID WC  . LORazepam  0.5 mg Oral  BID  . LORazepam  1 mg Oral QHS  . magnesium oxide  400 mg Oral Daily  . metoprolol tartrate  25 mg Oral BID  . ondansetron (ZOFRAN) IV  4 mg Intravenous Once  . pantoprazole  40 mg Oral Daily  . rivaroxaban  15 mg Oral Q supper   . aztreonam 1 g (12/19/17 9735)   Anti-infectives (From admission, onward)   Start     Dose/Rate Route Frequency Ordered Stop   12/18/17 0800  aztreonam (AZACTAM) 1 g in sodium chloride 0.9 % 100 mL IVPB     1 g 200 mL/hr over 30 Minutes Intravenous Every 8 hours 12/18/17 0509     12/17/17 2230  aztreonam (AZACTAM) 2 g in sodium chloride 0.9 % 100 mL IVPB     2 g 200 mL/hr over 30 Minutes Intravenous  Once 12/17/17 2220 12/18/17 0154   12/17/17 2130  ciprofloxacin (CIPRO) IVPB 400 mg  Status:  Discontinued     400 mg 200 mL/hr over 60 Minutes Intravenous  Once 12/17/17 2115 12/17/17 2133     Assessment/Plan  Mild dementia AF - on Xarelto Hx CHF/PTVP COPD Hx diabetes - Metformin at home Hypertension Dyslipidemia  Constipation   Nausea,  vomiting, diarrhea Possible intussusception on CT  FEN:No IV fluids listed, D1 diet ID:  Aztreoman 8/26 =>> day 3 DVT:  SCD/rivaroxaban Follow up:  TBD  Plan:  I will give her some Miralax, glycerine suppository, and place her on Metamucil.  Regular diet.  No acute abdominal issues currently.       LOS: 2 days    [place] Stegemann 12/19/2017 [phone]

## 2017-12-19 NOTE — Progress Notes (Signed)
PROGRESS NOTE    Angela Hines  LSL:[id number] DOB: 20-Dec-1932 DOA: 12/17/2017 PCP: Kelton Pillar, MD    Brief Narrative:  84 y.o. femalewith medical history significant ofAfib on xarelto,hypertension, hyperlipidemia, diabetes mellitus, COPD, GERD, depression, anxiety, s/p ofTAVRwithbioprosthetic valve, atrial fibrillation on Xarelto, pacemaker placement, CHF with EF of 40%, recent admission for Tylenol overdose. She presented secondary to nausea and vomiting with initial concern for intussusception. General surgery consulted who feels this is secondary to gastroenteritis.    Assessment & Plan:   Principal Problem:   Nausea & vomiting Active Problems:   COPD (chronic obstructive pulmonary disease) (HCC)   Gastroesophageal reflux disease without esophagitis   Hypertension   AF (paroxysmal atrial fibrillation) (HCC)   S/P AVR (aortic valve replacement)   Protein-calorie malnutrition, severe   Chronic systolic CHF (congestive heart failure) (HCC)   Diabetes mellitus without complication (HCC)   Depression with anxiety   UTI (urinary tract infection)   Hypokalemia  Nausea and vomiting -Initial concerns for possible jejunal intussusception.  -General surgery was consulted, no findings necessitating surgical management noted -General surgery recommendations to advance diet -CT personally reviewed. Findings of stool throughout the colon noted. No result following glycerin enema. -Will give trial of lactulose PO  Diabetes mellitus, -Patient had been continued on metformin as an outpatient. -Will continue with SSI coverage as needed  Urinary tract infection -Patient was started on Aztreonam -Urine culture with multiple species present -No leukocytosis. No fevers -Will plan for 3-5 days of tx  Anemia Acute on chronic. In setting of hydration. No evidence of acute blood loss -Recheck CBC in AM -Watch in setting of Xarelto  COPD Stable -No wheezing on  auscultation  GERD Stable. -Will continue with Protonix as tolerated  Essential hypertension -Continue amlodipine -Will continue with hydralazine as needed  Atrial fibrillation Currently on anticoagulation. Initially held in setting of anticipated surgery. Hemoglobin down slightly without evidence of hematochezia -Restart Xarelto -Will continue with metoprolol and amiodarone as tolerated  S/p aortic valve replacement -Remains stable at this time  Chronic systolic heart failure -clinically compensated -Lasix had been on hold while receiving hydration -Will resume lasix  Hypokalemia -Replaced. -Repeat bmet in AM.  Depression/anxiety -Will continue with Welbutrin as tolerated  Severe protein malnutrition -Continue with protein supplementation while tolerating diet well  DVT prophylaxis: Xarelto Code Status: DNR Family Communication: Pt in room, family not at bedside Disposition Plan: Home health PT after bowel movement  Consultants:   General Surgery  Procedures:     Antimicrobials: Anti-infectives (From admission, onward)   Start     Dose/Rate Route Frequency Ordered Stop   12/18/17 0800  aztreonam (AZACTAM) 1 g in sodium chloride 0.9 % 100 mL IVPB     1 g 200 mL/hr over 30 Minutes Intravenous Every 8 hours 12/18/17 0509 12/20/17 2359   12/17/17 2230  aztreonam (AZACTAM) 2 g in sodium chloride 0.9 % 100 mL IVPB     2 g 200 mL/hr over 30 Minutes Intravenous  Once 12/17/17 2220 12/18/17 0154   12/17/17 2130  ciprofloxacin (CIPRO) IVPB 400 mg  Status:  Discontinued     400 mg 200 mL/hr over 60 Minutes Intravenous  Once 12/17/17 2115 12/17/17 2133       Subjective: Eager to go home  Objective: Vitals:   12/18/17 2100 12/19/17 0506 12/19/17 1020 12/19/17 1340  BP: (!) 109/56 116/61  118/63  Pulse: 64 60 62 65  Resp: 16 16  18   Temp: 98.5 F (36.9 C) 98.4  F (36.9 C)  98.5 F (36.9 C)  TempSrc: Oral Oral  Oral  SpO2: 98% 98%  99%  Weight:       Height:        Intake/Output Summary (Last 24 hours) at 12/19/2017 1522 Last data filed at 12/19/2017 1300 Gross per 24 hour  Intake 1500 ml  Output 300 ml  Net 1200 ml   Filed Weights   12/17/17 1641 12/17/17 2312  Weight: 50.3 kg 48.3 kg    Examination:  General exam: Appears calm and comfortable  Respiratory system: Clear to auscultation. Respiratory effort normal. Cardiovascular system: S1 & S2 heard, RRR Gastrointestinal system: Abdomen is nondistended, soft and nontender. No organomegaly or masses felt. Normal bowel sounds heard. Central nervous system: Alert and oriented. No focal neurological deficits. Extremities: Symmetric 5 x 5 power. Skin: No rashes, lesions  Psychiatry: Judgement and insight appear normal. Mood & affect appropriate.   Data Reviewed: I have personally reviewed following labs and imaging studies  CBC: Recent Labs  Lab 12/17/17 1733 12/18/17 0429 12/19/17 0418  WBC 9.6 8.4 6.6  NEUTROABS 6.3  --   --   HGB 10.0* 8.5* 7.9*  HCT 32.9* 28.6* 26.4*  MCV 85.0 86.1 86.0  PLT 452* 369 662   Basic Metabolic Panel: Recent Labs  Lab 12/17/17 1733 12/18/17 0429 12/19/17 0418  NA 138 139 140  K 3.1* 3.2* 3.4*  CL 97* 104 108  CO2 27 27 26   GLUCOSE 90 85 89  BUN 20 17 21   CREATININE 0.70 0.65 0.81  CALCIUM 9.6 8.9 8.8*  MG  --  1.9  --    GFR: Estimated Creatinine Clearance: 40.1 mL/min (by C-G formula based on SCr of 0.81 mg/dL). Liver Function Tests: Recent Labs  Lab 12/17/17 1733  AST 26  ALT 15  ALKPHOS 88  BILITOT 0.5  PROT 7.7  ALBUMIN 4.0   Recent Labs  Lab 12/17/17 1733  LIPASE 26   No results for input(s): AMMONIA in the last 168 hours. Coagulation Profile: No results for input(s): INR, PROTIME in the last 168 hours. Cardiac Enzymes: No results for input(s): CKTOTAL, CKMB, CKMBINDEX, TROPONINI in the last 168 hours. BNP (last 3 results) No results for input(s): PROBNP in the last 8760 hours. HbA1C: No results  for input(s): HGBA1C in the last 72 hours. CBG: Recent Labs  Lab 12/18/17 1705 12/18/17 1802 12/18/17 2102 12/19/17 0728 12/19/17 1222  GLUCAP 76 147* 134* 101* 100*   Lipid Profile: No results for input(s): CHOL, HDL, LDLCALC, TRIG, CHOLHDL, LDLDIRECT in the last 72 hours. Thyroid Function Tests: No results for input(s): TSH, T4TOTAL, FREET4, T3FREE, THYROIDAB in the last 72 hours. Anemia Panel: No results for input(s): VITAMINB12, FOLATE, FERRITIN, TIBC, IRON, RETICCTPCT in the last 72 hours. Sepsis Labs: No results for input(s): PROCALCITON, LATICACIDVEN in the last 168 hours.  Recent Results (from the past 240 hour(s))  Urine culture     Status: Abnormal   Collection Time: 12/17/17  7:18 PM  Result Value Ref Range Status   Specimen Description   Final    URINE, RANDOM Performed at [facility] [address] [place], [place] [address]    Special Requests   Final    NONE Performed at [facility], [place] [address], [place], [place] [address]    Culture MULTIPLE SPECIES PRESENT, SUGGEST RECOLLECTION (A)  Final   Report Status 12/19/2017 FINAL  Final  Culture, blood (Routine X 2) w Reflex to ID  Panel     Status: None (Preliminary result)   Collection Time: 12/17/17 11:30 PM  Result Value Ref Range Status   Specimen Description   Final    BLOOD RIGHT ANTECUBITAL Performed at [facility] [address] [place], [place] [address]    Special Requests   Final    BOTTLES DRAWN AEROBIC AND ANAEROBIC Blood Culture adequate volume Performed at [facility] [address], [place], [place] [address]    Culture   Final    NO GROWTH 1 DAY Performed at [facility], [place] [address] [place], [place] [address]    Report Status PENDING  Incomplete  Culture, blood (Routine X 2) w Reflex to ID Panel     Status: None (Preliminary result)   Collection Time: 12/17/17 11:31 PM  Result Value Ref Range  Status   Specimen Description   Final    BLOOD RIGHT ANTECUBITAL Performed at [facility] [address] [place], [place] [address]    Special Requests   Final    BOTTLES DRAWN AEROBIC AND ANAEROBIC Blood Culture adequate volume Performed at [facility] [address] [place], [place] [address]    Culture   Final    NO GROWTH 1 DAY Performed at [facility], [place] [address] [place], [place] [address]    Report Status PENDING  Incomplete     Radiology Studies: Dg Chest 2 View  Result Date: 12/17/2017 CLINICAL DATA:  84 year old female with generalized weakness, nausea vomiting. EXAM: CHEST - 2 VIEW COMPARISON:  Chest radiographs 09/10/2017 and earlier. Lumbar radiographs 11/21/2017. FINDINGS: Semi upright AP and lateral views of the chest. Chronic anterior chest wall cardiac loop recorder. Stable mild cardiomegaly and prior aortic valve replacement. Chronically large lung volumes. No pneumothorax, pulmonary edema, pleural effusion or acute pulmonary opacity. Stable mild L1 compression fracture since July. Abdominal Calcified aortic atherosclerosis. Negative visible bowel gas pattern. No pneumoperitoneum is visible. IMPRESSION: 1.  No acute cardiopulmonary abnormality. 2. Stable mild L1 compression fracture since July. 3.  Aortic Atherosclerosis (ICD10-I70.0). Electronically Signed   By: Genevie Ann M.D.   On: 12/17/2017 17:26   Ct Abdomen Pelvis W Contrast  Result Date: 12/17/2017 CLINICAL DATA:  Generalized weakness, nausea and vomiting for the past 2-3 days. EXAM: CT ABDOMEN AND PELVIS WITH CONTRAST TECHNIQUE: Multidetector CT imaging of the abdomen and pelvis was performed using the standard protocol following bolus administration of intravenous contrast. CONTRAST:  163mL ISOVUE-300 IOPAMIDOL (ISOVUE-300) INJECTION 61% COMPARISON:  09/10/2017. FINDINGS: Lower chest: Multiple small nodular densities at the lung bases with an interval decrease in  size and number. Hepatobiliary: Cholecystectomy clips. No significant change in intrahepatic and extrahepatic biliary ductal dilatation. No obstructing stone or mass seen. A partially enhancing inferior right lobe liver mass is again demonstrated. Including the poorly defined enhancing components, this measures 1.5 x 1.3 cm on image number 32 series 2. Pancreas: The previously demonstrated 1.4 x 0.8 cm cystic area in the uncinate process measures 1.6 x 0.8 cm in corresponding dimensions on image number 28 series 2. Moderate atrophy of the remainder the pancreas without significant change. Spleen: Normal in size without focal abnormality. Adrenals/Urinary Tract: Adrenal glands are unremarkable. Kidneys are normal, without renal calculi, focal lesion, or hydronephrosis. Bladder is unremarkable. Stomach/Bowel: Interval jejunal intussusception in the left upper abdomen. A 2nd jejunal intussusception is demonstrated in the left lower abdomen at the level of the upper pelvis. No visible mass.  No proximal dilatation. Prominent stool in the colon. Normal appearing appendix. Vascular/Lymphatic: Atheromatous arterial calcifications without aneurysm. No enlarged lymph nodes. Reproductive: Small calcified uterine fibroids. No adnexal masses. Other: No abdominal wall hernia or abnormality. No abdominopelvic ascites. Musculoskeletal: Proximal right femoral bone island. Lumbar spine degenerative changes. Interval approximately 35% L2 vertebral body superior endplate compression deformity with sclerosis and mild bony retropulsion. No acute fracture lines are seen. IMPRESSION: 1. Interval jejunal intussusception in the left upper abdomen and 2nd jejunal intussusception in the left lower abdomen. There is no evidence of bowel obstruction or ischemia. 2. Interval approximately 35% L2 vertebral body superior endplate compression deformity with sclerosis and mild bony retropulsion. No acute fracture. 3. Interval decrease in size and  number of small nodular densities at the lung bases, compatible with improving infection or inflammation. 4. Stable post cholecystectomy biliary ductal dilatation. 5. Slowly enlarging probable atypical hemangioma in the inferior right lobe of the liver. 6. No significant change in a cystic lesion in the uncinate process of the pancreas and diffuse pancreatic atrophy. Electronically Signed   By: Claudie Revering M.D.   On: 12/17/2017 21:01    Scheduled Meds: . amiodarone  100 mg Oral Daily  . amLODipine  2.5 mg Oral Daily  . buPROPion  150 mg Oral Daily  . feeding supplement  1 Container Oral TID BM  . gabapentin  600 mg Oral QHS  . insulin aspart  0-5 Units Subcutaneous QHS  . insulin aspart  0-9 Units Subcutaneous TID WC  . lactulose  10 g Oral NOW  . LORazepam  0.5 mg Oral BID  . LORazepam  1 mg Oral QHS  . magnesium oxide  400 mg Oral Daily  . metoprolol tartrate  25 mg Oral BID  . ondansetron (ZOFRAN) IV  4 mg Intravenous Once  . pantoprazole  40 mg Oral Daily  . psyllium  1 packet Oral BID  . rivaroxaban  15 mg Oral Q supper   Continuous Infusions: . aztreonam 1 g (12/19/17 1434)     LOS: 2 days   Marylu Lund, MD [facility] Pager [phone]  If 7PM-7AM, please contact night-coverage [email or web address] Password [facility] 12/19/2017, 3:22 PM

## 2017-12-19 NOTE — Progress Notes (Signed)
Pt per orders was given soap suds enema per orders @1810pm . Pt has been up to [facility] but no results except Pt has expelled the liquid given for the enema. Will continue to monitor Pt.and maintain current plan of care

## 2017-12-19 NOTE — Progress Notes (Signed)
Physical Therapy Treatment Patient Details Name: Angela Hines MRN: [id number] DOB: [birth year] Today's Date: 12/19/2017    History of Present Illness 84 y.o. female with medical history significant of A fib on xarelto,  hypertension, hyperlipidemia, diabetes mellitus, COPD, GERD, depression, anxiety, s/p of TAVR with bioprosthetic valve, atrial fibrillation on Xarelto, pacemaker placement, CHF with EF of 40%, recent admission for Tylenol overdose, who presents with nausea, vomiting.    PT Comments    Pt very cooperative/motivated and progressing well with mobility but continues unsteady with ambulation sans RW.  Follow Up Recommendations  Home health PT     Equipment Recommendations  None recommended by PT    Recommendations for Other Services       Precautions / Restrictions Precautions Precautions: Fall Precaution Comments: 7 falls in past 6 months Restrictions Weight Bearing Restrictions: No    Mobility  Bed Mobility Overal bed mobility: Modified Independent             General bed mobility comments: HOB up 30*  Transfers Overall transfer level: Needs assistance Equipment used: Rolling walker (2 wheeled) Transfers: Sit to/from Stand Sit to Stand: Supervision         General transfer comment: supervision for safety  Ambulation/Gait Ambulation/Gait assistance: Min guard;Supervision Gait Distance (Feet): 450 Feet Assistive device: Rolling walker (2 wheeled) Gait Pattern/deviations: Step-through pattern;Decreased stride length Gait velocity: WFL   General Gait Details: mild unsteadiness with turns but no overt loss of balance   Stairs             Wheelchair Mobility    Modified Rankin (Stroke Patients Only)       Balance Overall balance assessment: History of Falls;Needs assistance   Sitting balance-Leahy Scale: Good       Standing balance-Leahy Scale: Fair                              Cognition Arousal/Alertness:  Awake/alert Behavior During Therapy: WFL for tasks assessed/performed Overall Cognitive Status: Within Functional Limits for tasks assessed                                        Exercises      General Comments        Pertinent Vitals/Pain Pain Assessment: No/denies pain    Home Living                      Prior Function            PT Goals (current goals can now be found in the care plan section) Acute Rehab PT Goals Patient Stated Goal: stop falling PT Goal Formulation: With patient/family Time For Goal Achievement: 01/01/18 Potential to Achieve Goals: Good Progress towards PT goals: Progressing toward goals    Frequency    Min 3X/week      PT Plan Current plan remains appropriate    Co-evaluation              AM-PAC PT "6 Clicks" Daily Activity  Outcome Measure  Difficulty turning over in bed (including adjusting bedclothes, sheets and blankets)?: None Difficulty moving from lying on back to sitting on the side of the bed? : None Difficulty sitting down on and standing up from a chair with arms (e.g., wheelchair, bedside commode, etc,.)?: A Little Help needed moving to and from a bed  to chair (including a wheelchair)?: A Little Help needed walking in hospital room?: A Little Help needed climbing 3-5 steps with a railing? : A Little 6 Click Score: 20    End of Session Equipment Utilized During Treatment: Gait belt Activity Tolerance: Patient tolerated treatment well Patient left: in chair;with call bell/phone within reach;with chair alarm set Nurse Communication: Mobility status PT Visit Diagnosis: History of falling (Z91.81);Repeated falls (R29.6);Difficulty in walking, not elsewhere classified (R26.2)     Time: 5894-8347 PT Time Calculation (min) (ACUTE ONLY): 18 min  Charges:  $Gait Training: 8-22 mins                     Pg [phone]    Ramin Zoll 12/19/2017, 12:35 PM

## 2017-12-20 ENCOUNTER — Ambulatory Visit (INDEPENDENT_AMBULATORY_CARE_PROVIDER_SITE_OTHER): Payer: Medicare Other | Admitting: *Deleted

## 2017-12-20 DIAGNOSIS — I480 Paroxysmal atrial fibrillation: Secondary | ICD-10-CM

## 2017-12-20 DIAGNOSIS — F418 Other specified anxiety disorders: Secondary | ICD-10-CM

## 2017-12-20 LAB — BASIC METABOLIC PANEL
Anion gap: 5 (ref 5–15)
BUN: 17 mg/dL (ref 8–23)
CHLORIDE: 111 mmol/L (ref 98–111)
CO2: 25 mmol/L (ref 22–32)
CREATININE: 0.75 mg/dL (ref 0.44–1.00)
Calcium: 8.7 mg/dL — ABNORMAL LOW (ref 8.9–10.3)
Glucose, Bld: 100 mg/dL — ABNORMAL HIGH (ref 70–99)
POTASSIUM: 3.7 mmol/L (ref 3.5–5.1)
SODIUM: 141 mmol/L (ref 135–145)

## 2017-12-20 LAB — CBC
HCT: 26.1 % — ABNORMAL LOW (ref 36.0–46.0)
Hemoglobin: 7.9 g/dL — ABNORMAL LOW (ref 12.0–15.0)
MCH: 26.2 pg (ref 26.0–34.0)
MCHC: 30.3 g/dL (ref 30.0–36.0)
MCV: 86.4 fL (ref 78.0–100.0)
PLATELETS: 303 10*3/uL (ref 150–400)
RBC: 3.02 MIL/uL — AB (ref 3.87–5.11)
RDW: 15.9 % — ABNORMAL HIGH (ref 11.5–15.5)
WBC: 7 10*3/uL (ref 4.0–10.5)

## 2017-12-20 LAB — GLUCOSE, CAPILLARY
Glucose-Capillary: 103 mg/dL — ABNORMAL HIGH (ref 70–99)
Glucose-Capillary: 116 mg/dL — ABNORMAL HIGH (ref 70–99)

## 2017-12-20 MED ORDER — POLYETHYLENE GLYCOL 3350 17 G PO PACK: 17.0000 g | PACK | Freq: Every day | ORAL | 0 refills | Status: DC

## 2017-12-20 MED ORDER — SORBITOL 70 % SOLN
960.0000 mL | TOPICAL_OIL | Freq: Once | ORAL | Status: AC
  Administered 2017-12-20: 960 mL via RECTAL
  Filled 2017-12-20: qty 473

## 2017-12-20 MED ORDER — DOCUSATE SODIUM 100 MG PO CAPS: 100.0000 mg | ORAL_CAPSULE | Freq: Two times a day (BID) | ORAL | 0 refills | Status: AC

## 2017-12-20 NOTE — Discharge Summary (Signed)
Physician Discharge Summary  Angela Hines QKS:[id number] DOB: [birth year] DOA: 12/17/2017  PCP: Kelton Pillar, MD  Admit date: 12/17/2017 Discharge date: 12/20/2017  Admitted From: Home Disposition:  Home  Recommendations for Outpatient Follow-up:  1. Follow up with PCP in 1-2 weeks  Home Health:PT, OT   Discharge Condition:Improved CODE STATUS:DNR Diet recommendation: Diabetic   Brief/Interim Summary: 84 y.o.femalewith medical history significant ofAfib on xarelto,hypertension, hyperlipidemia, diabetes mellitus, COPD, GERD, depression, anxiety, s/p ofTAVRwithbioprosthetic valve, atrial fibrillation on Xarelto, pacemaker placement, CHF with EF of 40%, recent admission for Tylenol overdose. She presented secondary to nausea and vomiting with initial concern for intussusception. General surgery consulted who feels this is secondary to gastroenteritis.   Nausea and vomiting -Initial concerns for possible jejunal intussusception.  -General surgery was consulted, no findings necessitating surgical management noted -General surgery recommendations to advance diet. Pt has since tolerated an advanced diet -CT personally reviewed. Findings of stool throughout the colon noted -Initially no results with multiple cathartics. Ultimately very good results with SMOG enema. Recommend continued bowel regimen on discharge. Consider referral to GI if continued issues with constipation  Diabetes mellitus, -Patient had been continued on metformin as an outpatient.  Urinary tract infection -Patient was started on Aztreonam -Urine culture with multiple species present -No leukocytosis. No fevers -Completed 3 days of abx  Anemia Acute on chronic. In setting of hydration. No evidence of acute blood loss -remained stable  COPD Stable -No wheezing on auscultation  GERD Stable. -Continued on Protonix   Essential hypertension -Continued amlodipine, metoprolol  Atrial  fibrillation Currently on anticoagulation. Initially held in setting of anticipated surgery. Hemoglobin down slightly without evidence of hematochezia -Restarted Xarelto -Will continued with metoprolol and amiodarone as tolerated  S/p aortic valve replacement -Remains stable at this time  Chronic systolic heart failure -clinically compensated -Lasix had initially been on hold while receiving hydration, later resumed when tolerating diet  Hypokalemia -Replaced.  Depression/anxiety -Will continue with Welbutrin as tolerated  Severe protein malnutrition -Continue with protein supplementation while tolerating diet well  Discharge Diagnoses:  Principal Problem:   Nausea & vomiting Active Problems:   COPD (chronic obstructive pulmonary disease) (HCC)   Gastroesophageal reflux disease without esophagitis   Hypertension   AF (paroxysmal atrial fibrillation) (HCC)   S/P AVR (aortic valve replacement)   Protein-calorie malnutrition, severe   Chronic systolic CHF (congestive heart failure) (HCC)   Diabetes mellitus without complication (HCC)   Depression with anxiety   UTI (urinary tract infection)   Hypokalemia    Discharge Instructions   Allergies as of 12/20/2017      Reactions   Clonidine Swelling, Other (See Comments)   "head swelling"   Captopril Swelling   Codeine Nausea And Vomiting, Other (See Comments)   GI Intolerance   Ampicillin Rash   Ceclor [cefaclor] Rash      Medication List    TAKE these medications   albuterol 108 (90 Base) MCG/ACT inhaler Commonly known as:  PROVENTIL HFA;VENTOLIN HFA Inhale 2 puffs into the lungs every 6 (six) hours as needed for shortness of breath.   amiodarone 100 MG tablet Commonly known as:  PACERONE Take 100 mg by mouth daily.   amLODipine 2.5 MG tablet Commonly known as:  NORVASC Take 1 tablet (2.5 mg total) by mouth daily.   bismuth subsalicylate 597 IX/18ZB suspension Commonly known as:  PEPTO BISMOL Take  30 mLs by mouth every 6 (six) hours as needed for indigestion.   buPROPion 300 MG 24 hr tablet Commonly known  as:  WELLBUTRIN XL Take 150 mg by mouth daily.   docusate sodium 100 MG capsule Commonly known as:  COLACE Take 1 capsule (100 mg total) by mouth 2 (two) times daily.   feeding supplement (ENSURE ENLIVE) Liqd Take 237 mLs by mouth 3 (three) times daily between meals. What changed:  when to take this   furosemide 20 MG tablet Commonly known as:  LASIX Take 1 tablet (20 mg total) by mouth daily.   gabapentin 300 MG capsule Commonly known as:  NEURONTIN Take 600 mg by mouth at bedtime.   ibuprofen 200 MG tablet Commonly known as:  ADVIL,MOTRIN Take 400 mg by mouth daily as needed for headache.   LORazepam 0.5 MG tablet Commonly known as:  ATIVAN Take 1 tablet (0.5 mg total) by mouth 3 (three) times daily as needed for anxiety. What changed:    when to take this  additional instructions   magnesium oxide 400 (241.3 Mg) MG tablet Commonly known as:  MAG-OX Take 1 tablet (400 mg total) by mouth daily.   metFORMIN 1000 MG tablet Commonly known as:  GLUCOPHAGE Take 1,000 mg by mouth daily.   metoprolol tartrate 25 MG tablet Commonly known as:  LOPRESSOR Take 25 mg by mouth 2 (two) times daily.   omeprazole 40 MG capsule Commonly known as:  PRILOSEC Take 40 mg by mouth daily.   polyethylene glycol packet Commonly known as:  MIRALAX / GLYCOLAX Take 17 g by mouth daily as needed for mild constipation. What changed:  Another medication with the same name was added. Make sure you understand how and when to take each.   polyethylene glycol packet Commonly known as:  MIRALAX / GLYCOLAX Take 17 g by mouth daily. What changed:  You were already taking a medication with the same name, and this prescription was added. Make sure you understand how and when to take each.   Rivaroxaban 15 MG Tabs tablet Commonly known as:  XARELTO Take 15 mg by mouth daily with  supper.      Follow-up Information    Kelton Pillar, MD. Schedule an appointment as soon as possible for a visit in 1 week(s).   Specialty:  Family Medicine Contact information: [address] [place] [address] [phone]        Josue Hector, MD .   Specialty:  Cardiology Contact information: [phone] [place] [address] [phone]          Allergies  Allergen Reactions  . Clonidine Swelling and Other (See Comments)    "head swelling"  . Captopril Swelling  . Codeine Nausea And Vomiting and Other (See Comments)    GI Intolerance   . Ampicillin Rash  . Ceclor [Cefaclor] Rash    Consultations:  General Surgery  Procedures/Studies: Dg Chest 2 View  Result Date: 12/17/2017 CLINICAL DATA:  84 year old female with generalized weakness, nausea vomiting. EXAM: CHEST - 2 VIEW COMPARISON:  Chest radiographs 09/10/2017 and earlier. Lumbar radiographs 11/21/2017. FINDINGS: Semi upright AP and lateral views of the chest. Chronic anterior chest wall cardiac loop recorder. Stable mild cardiomegaly and prior aortic valve replacement. Chronically large lung volumes. No pneumothorax, pulmonary edema, pleural effusion or acute pulmonary opacity. Stable mild L1 compression fracture since July. Abdominal Calcified aortic atherosclerosis. Negative visible bowel gas pattern. No pneumoperitoneum is visible. IMPRESSION: 1.  No acute cardiopulmonary abnormality. 2. Stable mild L1 compression fracture since July. 3.  Aortic Atherosclerosis (ICD10-I70.0). Electronically Signed   By:  Genevie Ann M.D.   On: 12/17/2017 17:26   Dg Lumbar Spine Complete  Result Date: 11/21/2017 CLINICAL DATA:  Low back pain for several days, no known injury, initial encounter EXAM: LUMBAR SPINE - COMPLETE 4+ VIEW COMPARISON:  09/10/2017 FINDINGS: Five lumbar type vertebral bodies are well visualized. Mild vertebral body height loss is noted at L2. This is of uncertain  chronicity. MRI would be more helpful to assess for acuity. Atherosclerotic changes are noted. Disc space narrowing at L5-S1 is seen. IMPRESSION: Mild L1 compression deformity new from the prior exam but of uncertain chronicity. Nonemergent MRI would be helpful for further evaluation as clinically necessary. Electronically Signed   By: Inez Catalina M.D.   On: 11/21/2017 14:39   Ct Head Wo Contrast  Result Date: 11/21/2017 CLINICAL DATA:  84 year old female with altered mental status. Possible overdose. EXAM: CT HEAD WITHOUT CONTRAST TECHNIQUE: Contiguous axial images were obtained from the base of the skull through the vertex without intravenous contrast. COMPARISON:  11/13/2017 and prior CTs FINDINGS: Brain: No evidence of acute infarction, hemorrhage, hydrocephalus, extra-axial collection or mass lesion/mass effect. Mild atrophy and moderate chronic small-vessel white matter ischemic changes are again noted. Vascular: Carotid atherosclerotic calcifications again noted. Skull: Normal. Negative for fracture or focal lesion. Sinuses/Orbits: No acute abnormality Other: None IMPRESSION: 1. No evidence of acute intracranial abnormality 2. Atrophy and chronic small-vessel white matter ischemic changes. Electronically Signed   By: Margarette Canada M.D.   On: 11/21/2017 10:52   Ct Abdomen Pelvis W Contrast  Result Date: 12/17/2017 CLINICAL DATA:  Generalized weakness, nausea and vomiting for the past 2-3 days. EXAM: CT ABDOMEN AND PELVIS WITH CONTRAST TECHNIQUE: Multidetector CT imaging of the abdomen and pelvis was performed using the standard protocol following bolus administration of intravenous contrast. CONTRAST:  159mL ISOVUE-300 IOPAMIDOL (ISOVUE-300) INJECTION 61% COMPARISON:  09/10/2017. FINDINGS: Lower chest: Multiple small nodular densities at the lung bases with an interval decrease in size and number. Hepatobiliary: Cholecystectomy clips. No significant change in intrahepatic and extrahepatic biliary ductal  dilatation. No obstructing stone or mass seen. A partially enhancing inferior right lobe liver mass is again demonstrated. Including the poorly defined enhancing components, this measures 1.5 x 1.3 cm on image number 32 series 2. Pancreas: The previously demonstrated 1.4 x 0.8 cm cystic area in the uncinate process measures 1.6 x 0.8 cm in corresponding dimensions on image number 28 series 2. Moderate atrophy of the remainder the pancreas without significant change. Spleen: Normal in size without focal abnormality. Adrenals/Urinary Tract: Adrenal glands are unremarkable. Kidneys are normal, without renal calculi, focal lesion, or hydronephrosis. Bladder is unremarkable. Stomach/Bowel: Interval jejunal intussusception in the left upper abdomen. A 2nd jejunal intussusception is demonstrated in the left lower abdomen at the level of the upper pelvis. No visible mass. No proximal dilatation. Prominent stool in the colon. Normal appearing appendix. Vascular/Lymphatic: Atheromatous arterial calcifications without aneurysm. No enlarged lymph nodes. Reproductive: Small calcified uterine fibroids. No adnexal masses. Other: No abdominal wall hernia or abnormality. No abdominopelvic ascites. Musculoskeletal: Proximal right femoral bone island. Lumbar spine degenerative changes. Interval approximately 35% L2 vertebral body superior endplate compression deformity with sclerosis and mild bony retropulsion. No acute fracture lines are seen. IMPRESSION: 1. Interval jejunal intussusception in the left upper abdomen and 2nd jejunal intussusception in the left lower abdomen. There is no evidence of bowel obstruction or ischemia. 2. Interval approximately 35% L2 vertebral body superior endplate compression deformity with sclerosis and mild bony retropulsion. No acute fracture. 3.  Interval decrease in size and number of small nodular densities at the lung bases, compatible with improving infection or inflammation. 4. Stable post  cholecystectomy biliary ductal dilatation. 5. Slowly enlarging probable atypical hemangioma in the inferior right lobe of the liver. 6. No significant change in a cystic lesion in the uncinate process of the pancreas and diffuse pancreatic atrophy. Electronically Signed   By: Claudie Revering M.D.   On: 12/17/2017 21:01     Subjective: Eager to go home  Discharge Exam: Vitals:   12/20/17 1247 12/20/17 1453  BP: (!) 117/56 (!) 107/57  Pulse: 64 60  Resp:    Temp: 98.2 F (36.8 C) 98.6 F (37 C)  SpO2: 100% 98%   Vitals:   12/19/17 2159 12/20/17 0522 12/20/17 1247 12/20/17 1453  BP: (!) 149/73 (!) 145/89 (!) 117/56 (!) 107/57  Pulse: 68 62 64 60  Resp: 18 16    Temp: 98.8 F (37.1 C) 97.7 F (36.5 C) 98.2 F (36.8 C) 98.6 F (37 C)  TempSrc: Oral Oral Oral Oral  SpO2: 94% 100% 100% 98%  Weight:      Height:        General: Pt is alert, awake, not in acute distress Cardiovascular: RRR, S1/S2 +, no rubs, no gallops Respiratory: CTA bilaterally, no wheezing, no rhonchi Abdominal: Soft, NT, ND, bowel sounds + Extremities: no edema, no cyanosis   The results of significant diagnostics from this hospitalization (including imaging, microbiology, ancillary and laboratory) are listed below for reference.     Microbiology: Recent Results (from the past 240 hour(s))  Urine culture     Status: Abnormal   Collection Time: 12/17/17  7:18 PM  Result Value Ref Range Status   Specimen Description   Final    URINE, RANDOM Performed at [facility] [address] [place], [place] [address]    Special Requests   Final    NONE Performed at [facility], [place] [address] [place], [place] [address]    Culture MULTIPLE SPECIES PRESENT, SUGGEST RECOLLECTION (A)  Final   Report Status 12/19/2017 FINAL  Final  Culture, blood (Routine X 2) w Reflex to ID Panel     Status: None (Preliminary result)   Collection Time: 12/17/17 11:30 PM  Result Value Ref  Range Status   Specimen Description   Final    BLOOD RIGHT ANTECUBITAL Performed at [facility] [address] [place], [place] [address]    Special Requests   Final    BOTTLES DRAWN AEROBIC AND ANAEROBIC Blood Culture adequate volume Performed at [facility] [address] [place], [place] [address]    Culture   Final    NO GROWTH 2 DAYS Performed at [facility] [address], [place], [place] [address]    Report Status PENDING  Incomplete  Culture, blood (Routine X 2) w Reflex to ID Panel     Status: None (Preliminary result)   Collection Time: 12/17/17 11:31 PM  Result Value Ref Range Status   Specimen Description   Final    BLOOD RIGHT ANTECUBITAL Performed at [facility] [address], [place], [place] [address]    Special Requests   Final    BOTTLES DRAWN AEROBIC AND ANAEROBIC Blood Culture adequate volume Performed at [facility] [address] [place], [place] [address]    Culture   Final    NO GROWTH 2 DAYS Performed at [facility], [place] [address] [place], [place] [address]    Report Status PENDING  Incomplete     Labs: BNP (last 3 results) Recent Labs    05/15/17 2035 06/09/17 1855 12/17/17 2330  BNP 831.6* 626.8* 656.8*   Basic Metabolic Panel: Recent Labs  Lab 12/17/17 1733 12/18/17 0429 12/19/17 0418 12/20/17 0410  NA 138 139 140 141  K 3.1* 3.2* 3.4* 3.7  CL 97* 104 108 111  CO2 27 27 26 25   GLUCOSE 90 85 89 100*  BUN 20 17 21 17   CREATININE 0.70 0.65 0.81 0.75  CALCIUM 9.6 8.9 8.8* 8.7*  MG  --  1.9  --   --    Liver Function Tests: Recent Labs  Lab 12/17/17 1733  AST 26  ALT 15  ALKPHOS 88  BILITOT 0.5  PROT 7.7  ALBUMIN 4.0   Recent Labs  Lab 12/17/17 1733  LIPASE 26   No results for input(s): AMMONIA in the last 168 hours. CBC: Recent Labs  Lab 12/17/17 1733 12/18/17 0429 12/19/17 0418 12/20/17 0410  WBC 9.6 8.4  6.6 7.0  NEUTROABS 6.3  --   --   --   HGB 10.0* 8.5* 7.9* 7.9*  HCT 32.9* 28.6* 26.4* 26.1*  MCV 85.0 86.1 86.0 86.4  PLT 452* 369 325 303   Cardiac Enzymes: No results for input(s): CKTOTAL, CKMB, CKMBINDEX, TROPONINI in the last 168 hours. BNP: Invalid input(s): POCBNP CBG: Recent Labs  Lab 12/19/17 1222 12/19/17 1623 12/19/17 2154 12/20/17 0727 12/20/17 1200  GLUCAP 100* 170* 124* 103* 116*   D-Dimer No results for input(s): DDIMER in the last 72 hours. Hgb A1c No results for input(s): HGBA1C in the last 72 hours. Lipid Profile No results for input(s): CHOL, HDL, LDLCALC, TRIG, CHOLHDL, LDLDIRECT in the last 72 hours. Thyroid function studies No results for input(s): TSH, T4TOTAL, T3FREE, THYROIDAB in the last 72 hours.  Invalid input(s): FREET3 Anemia work up No results for input(s): VITAMINB12, FOLATE, FERRITIN, TIBC, IRON, RETICCTPCT in the last 72 hours. Urinalysis    Component Value Date/Time   COLORURINE YELLOW 12/17/2017 1918   APPEARANCEUR CLEAR 12/17/2017 1918   LABSPEC 1.010 12/17/2017 1918   PHURINE 7.0 12/17/2017 1918   GLUCOSEU NEGATIVE 12/17/2017 1918   HGBUR MODERATE (A) 12/17/2017 1918   BILIRUBINUR NEGATIVE 12/17/2017 1918   KETONESUR NEGATIVE 12/17/2017 1918   PROTEINUR NEGATIVE 12/17/2017 1918   NITRITE NEGATIVE 12/17/2017 1918   LEUKOCYTESUR LARGE (A) 12/17/2017 1918   Sepsis Labs Invalid input(s): PROCALCITONIN,  WBC,  LACTICIDVEN Microbiology Recent Results (from the past 240 hour(s))  Urine culture     Status: Abnormal   Collection Time: 12/17/17  7:18 PM  Result Value Ref Range Status   Specimen Description   Final    URINE, RANDOM Performed at [facility], [place] [address], [place], [place] [address]    Special Requests   Final    NONE Performed at [facility], [place] [address], [place], [place] [address]    Culture MULTIPLE SPECIES PRESENT, SUGGEST RECOLLECTION (A)  Final   Report Status  12/19/2017 FINAL  Final  Culture, blood (Routine X 2) w Reflex to ID Panel     Status: None (Preliminary result)   Collection Time: 12/17/17 11:30 PM  Result Value Ref Range Status   Specimen Description   Final    BLOOD RIGHT ANTECUBITAL Performed at [facility] [address] [place], [place] [address]    Special  Requests   Final    BOTTLES DRAWN AEROBIC AND ANAEROBIC Blood Culture adequate volume Performed at [facility] [address] [place], [place] [address]    Culture   Final    NO GROWTH 2 DAYS Performed at [facility] [address] [place], [place] [address]    Report Status PENDING  Incomplete  Culture, blood (Routine X 2) w Reflex to ID Panel     Status: None (Preliminary result)   Collection Time: 12/17/17 11:31 PM  Result Value Ref Range Status   Specimen Description   Final    BLOOD RIGHT ANTECUBITAL Performed at [facility] [address], [place], [place] [address]    Special Requests   Final    BOTTLES DRAWN AEROBIC AND ANAEROBIC Blood Culture adequate volume Performed at [facility] [address], [place], [place] [address]    Culture   Final    NO GROWTH 2 DAYS Performed at [facility] [address], [place], [place] [address]    Report Status PENDING  Incomplete   Time spent: 30 min  SIGNED:   Marylu Lund, MD  [facility] 12/20/2017, 2:58 PM  If 7PM-7AM, please contact night-coverage [email or web address] Password [email or web address]

## 2017-12-20 NOTE — Progress Notes (Signed)
Pt. Did not have a BM over night. PRN miralax given at 2202. Pt. Ambulated in hall and sat up in chair for 2 hours throughout the night. Will continue to monitor.

## 2017-12-20 NOTE — Care Management Important Message (Signed)
Important Message  Patient Details  Name: Alara Daniel MRN: [id number] Date of Birth: [birth year]   Medicare Important Message Given:  Yes    Kerin Salen 12/20/2017, 10:47 AMImportant Message  Patient Details  Name: Glorie Dowlen MRN: [id number] Date of Birth: 1932-12-14   Medicare Important Message Given:  Yes    Kerin Salen 12/20/2017, 10:47 AM

## 2017-12-20 NOTE — Progress Notes (Deleted)
Cardiology Office Note:    Date:  12/14/2017   ID:  Angela Hines, DOB 1932/05/18, MRN [id number]  PCP:  Kelton Pillar, MD  Cardiologist:  Jenkins Rouge, MD  Referring MD: Kelton Pillar, MD   No chief complaint on file.   History of Present Illness:    84 y.o. history of PAF, CHF, bioprosthetic AVR 2005 Edwards Pericardial 21 mm  Done at [facility] , COPD, DM-2 , HTN and HLD She has been having multiple mechanical falls She lives at [facility] independent living where she gets physical therapy and her medications given to her. She was seen in the emergency department again on 11/13/2017 after a fall which was felt to be due to loss of balance and was not preceded by any dizziness.  He did hit her head but there was no loss of consciousness.  CT of the head and neck were negative.  He was also seen in May after a fall and was found to be dehydrated.   She is on Xarelto 15 mg daily for stroke risk reduction. Loop recorder download 11/29/2017 showed no A. fib episodes.  She was admitted to the hospital on 11/23/2017 with an apparent accidental acetaminophen overdose.  Her liver function tests were stable. She was in the hospital for a week and then rehab for a week.   Last echo done 06/11/17 showed EF 40-45% mean gradient elevated 21 mmHg peak 33 mmHg but stable  ***   Past Medical History:  Diagnosis Date  . Atrial fibrillation ([place])   . CHF (congestive heart failure) ([place])   . COPD (chronic obstructive pulmonary disease) ([place])   . Diabetes mellitus without complication ([place])   . High cholesterol   . Hypertension     Past Surgical History:  Procedure Laterality Date  . CARDIAC PACEMAKER PLACEMENT  07/30/2014  . 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 CARDIOVERSION N/A 06/15/2017   Procedure: CARDIOVERSION;  Surgeon: Lelon Perla, MD;  Location[facility] ENDOSCOPY;  Service: Cardiovascular;  Laterality: N/A;  . CHOLECYSTECTOMY    . EXCISIONAL HEMORRHOIDECTOMY    .  LOOP RECORDER INSERTION N/A 08/22/2017   Procedure: LOOP RECORDER INSERTION;  Surgeon: [name], MD;  Location: [place] CV LAB;  Service: Cardiovascular;  Laterality: N/A;  . LOOP RECORDER REMOVAL N/A 08/22/2017   Procedure: LOOP RECORDER REMOVAL;  Surgeon: [name], MD;  Location: [place] CV LAB;  Service: Cardiovascular;  Laterality: N/A;    Current Medications: No outpatient medications have been marked as taking for the 12/27/17 encounter (Appointment) with Josue Hector, MD.     Allergies:   Clonidine; Captopril; Codeine; Ampicillin; and Ceclor [cefaclor]   Social History   Socioeconomic History  . Marital status: Single    Spouse name: Not on file  . Number of children: Not on file  . Years of education: Not on file  . Highest education level: Not on file  Occupational History  . Not on file  Social Needs  . Financial resource strain: Not on file  . Food insecurity:    Worry: Not on file    Inability: Not on file  . Transportation needs:    Medical: Not on file    Non-medical: Not on file  Tobacco Use  . Smoking status: Never Smoker  . Smokeless tobacco: Never Used  Substance and Sexual Activity  . Alcohol use: No  . Drug use: No  . Sexual activity: Never  Lifestyle  .  Physical activity:    Days per week: Not on file    Minutes per session: Not on file  . Stress: Not on file  Relationships  . Social connections:    Talks on phone: Not on file    Gets together: Not on file    Attends religious service: Not on file    Active member of club or organization: Not on file    Attends meetings of clubs or organizations: Not on file    Relationship status: Not on file  Other Topics Concern  . Not on file  Social History Narrative  . Not on file     Family History: The patient's family history includes Diabetes in her son; Emphysema in her father; Heart attack in her son; Heart disease in her son; Hypertension in her brother, brother, and sister;  Lung disease in her sister; Multiple myeloma in her son; Other in her son; Stroke in her brother. ROS:   Please see the history of present illness.     All other systems reviewed and are negative.  EKGs/Labs/Other Studies Reviewed:    The following studies were reviewed today:  Echo 06/11/17 Study Conclusions - Left ventricle: The cavity size was normal. There was moderate   concentric hypertrophy. Systolic function was mildly to   moderately reduced. The estimated ejection fraction was in the   range of 40% to 45%. Diffuse hypokinesis. - Aortic valve: A bioprosthetic valve is present in the aortic   position. There was moderate stenosis. Valve area (VTI): 0.31   cm^2. Valve area (Vmax): 0.39 cm^2. Valve area (Vmean): 0.39   cm^2. - Mitral valve: There was moderate regurgitation directed   posteriorly. - Left atrium: The atrium was moderately dilated. - Right ventricle: Systolic function was moderately reduced. - Tricuspid valve: There was moderate regurgitation. - Pulmonary arteries: Systolic pressure was mildly increased. PA   peak pressure: 33 mm Hg (S). - Inferior vena cava: The vessel was dilated. The respirophasic   diameter changes were blunted (< 50%), consistent with elevated   central venous pressure.  Impressions: - No prior echocardiogram is available for comparison.   LVEF is mildly to moderately decrased, LVEF 40-45%.   RVEF is moderately decreased.   Velocities and gradients across the bioprosthetic aortic valve   are elevated (2.9 m/sec, mean gradient 21 mmHg).   Moderate mitral and tricuspid regurgitation.   EKG:  SR PAC LVH with strain 12/18/17    Recent Labs: 05/22/2017: TSH 0.659 12/17/2017: ALT 15; B Natriuretic Peptide 425.4 12/18/2017: Magnesium 1.9 12/20/2017: BUN 17; Creatinine, Ser 0.75; Hemoglobin 7.9; Platelets 303; Potassium 3.7; Sodium 141    Physical Exam:    VS:  There were no vitals taken for this visit.    Wt Readings from Last 3  Encounters:  12/17/17 106 lb 6.4 oz (48.3 kg)  12/14/17 109 lb (49.4 kg)  11/21/17 100 lb 4.9 oz (45.5 kg)     Affect appropriate Chronically ill white female  HEENT: normal Neck supple with no adenopathy JVP normal no bruits no thyromegaly Lungs clear with no wheezing and good diaphragmatic motion Heart:  S1/S2 SEM through AVR no AR murmur, no rub, gallop or click PMI normal Abdomen: benighn, BS positve, no tenderness, no AAA no bruit.  No HSM or HJR Distal pulses intact with no bruits No edema Neuro non-focal Skin warm and dry Poor balance using walker    ASSESSMENT:    No diagnosis found. PLAN:    In order of  problems listed above:  HTN:  Continue current meds responds to valium bid as well with anxiety   Paroxysmal atrial fibrillation: Maintaining sinus rhythm on amiodarone and low-dose metoprolol. No afib recently on loop recorder, last interrogation was 11/29/17. She is on low dose xarelto 15 mg due to age and fall risk   Frequent falls: Her falls seem to be related to tripping, poor balance. She has been to rehab and continues to get home PT and OT for increased strength and balance.    AVR: Bioprosthetic aortic valve replacement in 2005.  Noted to have elevated velocities and gradients across the bioprosthetic 05/2017. Expected for 84 yo valve no AR anticoagulation may help slow deterioration due to microthrombosis as well   Chronic systolic heart failure: EF 40-45%  Doing well on decreased lasix. Appears euvolemic.     Medication Adjustments/Labs and Tests Ordered: Current medicines are reviewed at length with the patient today.  Concerns regarding medicines are outlined above. Labs and tests ordered and medication changes are outlined in the patient instructions below:  There are no Patient Instructions on file for this visit.   Signed, Jenkins Rouge, MD  12/20/2017 3:23 PM    [place]

## 2017-12-21 NOTE — Progress Notes (Signed)
Carelink Summary Report / Loop Recorder 

## 2017-12-23 LAB — CULTURE, BLOOD (ROUTINE X 2)
CULTURE: NO GROWTH
CULTURE: NO GROWTH
SPECIAL REQUESTS: ADEQUATE
SPECIAL REQUESTS: ADEQUATE

## 2017-12-27 ENCOUNTER — Ambulatory Visit: Payer: Medicare Other | Admitting: Cardiovascular Disease

## 2017-12-29 ENCOUNTER — Emergency Department (HOSPITAL_COMMUNITY)
Admission: EM | Admit: 2017-12-29 | Discharge: 2017-12-30 | Disposition: A | Discharge status: Home / Self Care | Payer: Medicare Other | Attending: Emergency Medicine | Admitting: Emergency Medicine

## 2017-12-29 DIAGNOSIS — Y999 Unspecified external cause status: Secondary | ICD-10-CM | POA: Insufficient documentation

## 2017-12-29 DIAGNOSIS — I5022 Chronic systolic (congestive) heart failure: Secondary | ICD-10-CM | POA: Insufficient documentation

## 2017-12-29 DIAGNOSIS — Y9389 Activity, other specified: Secondary | ICD-10-CM | POA: Insufficient documentation

## 2017-12-29 DIAGNOSIS — Z7901 Long term (current) use of anticoagulants: Secondary | ICD-10-CM | POA: Insufficient documentation

## 2017-12-29 DIAGNOSIS — Y92129 Unspecified place in nursing home as the place of occurrence of the external cause: Secondary | ICD-10-CM | POA: Diagnosis not present

## 2017-12-29 DIAGNOSIS — I110 Hypertensive heart disease with heart failure: Secondary | ICD-10-CM | POA: Insufficient documentation

## 2017-12-29 DIAGNOSIS — J449 Chronic obstructive pulmonary disease, unspecified: Secondary | ICD-10-CM | POA: Diagnosis not present

## 2017-12-29 DIAGNOSIS — S51811A Laceration without foreign body of right forearm, initial encounter: Secondary | ICD-10-CM | POA: Diagnosis not present

## 2017-12-29 DIAGNOSIS — W0110XA Fall on same level from slipping, tripping and stumbling with subsequent striking against unspecified object, initial encounter: Secondary | ICD-10-CM | POA: Diagnosis not present

## 2017-12-29 DIAGNOSIS — S81802A Unspecified open wound, left lower leg, initial encounter: Secondary | ICD-10-CM

## 2017-12-29 DIAGNOSIS — S41111A Laceration without foreign body of right upper arm, initial encounter: Secondary | ICD-10-CM

## 2017-12-29 DIAGNOSIS — S0083XA Contusion of other part of head, initial encounter: Secondary | ICD-10-CM

## 2017-12-29 DIAGNOSIS — Z7984 Long term (current) use of oral hypoglycemic drugs: Secondary | ICD-10-CM | POA: Insufficient documentation

## 2017-12-29 DIAGNOSIS — E119 Type 2 diabetes mellitus without complications: Secondary | ICD-10-CM | POA: Diagnosis not present

## 2017-12-29 LAB — CBC WITH DIFFERENTIAL/PLATELET
Abs Immature Granulocytes: 0.1 10*3/uL (ref 0.0–0.1)
BASOS PCT: 1 %
Basophils Absolute: 0.1 10*3/uL (ref 0.0–0.1)
EOS ABS: 0.1 10*3/uL (ref 0.0–0.7)
EOS PCT: 1 %
HCT: 28.1 % — ABNORMAL LOW (ref 36.0–46.0)
Hemoglobin: 8.1 g/dL — ABNORMAL LOW (ref 12.0–15.0)
IMMATURE GRANULOCYTES: 1 %
LYMPHS ABS: 2.1 10*3/uL (ref 0.7–4.0)
Lymphocytes Relative: 20 %
MCH: 25.4 pg — AB (ref 26.0–34.0)
MCHC: 28.8 g/dL — ABNORMAL LOW (ref 30.0–36.0)
MCV: 88.1 fL (ref 78.0–100.0)
MONO ABS: 0.7 10*3/uL (ref 0.1–1.0)
MONOS PCT: 7 %
NEUTROS PCT: 70 %
Neutro Abs: 7.7 10*3/uL (ref 1.7–7.7)
PLATELETS: 289 10*3/uL (ref 150–400)
RBC: 3.19 MIL/uL — ABNORMAL LOW (ref 3.87–5.11)
RDW: 16.3 % — AB (ref 11.5–15.5)
WBC: 10.7 10*3/uL — ABNORMAL HIGH (ref 4.0–10.5)

## 2017-12-29 LAB — BASIC METABOLIC PANEL
ANION GAP: 12 (ref 5–15)
BUN: 21 mg/dL (ref 8–23)
CALCIUM: 8.8 mg/dL — AB (ref 8.9–10.3)
CO2: 25 mmol/L (ref 22–32)
Chloride: 99 mmol/L (ref 98–111)
Creatinine, Ser: 1.03 mg/dL — ABNORMAL HIGH (ref 0.44–1.00)
GFR calc Af Amer: 57 mL/min — ABNORMAL LOW (ref >60)
GFR, EST NON AFRICAN AMERICAN: 49 mL/min — AB (ref >60)
GLUCOSE: 116 mg/dL — AB (ref 70–99)
Potassium: 3.6 mmol/L (ref 3.5–5.1)
Sodium: 136 mmol/L (ref 135–145)

## 2017-12-29 NOTE — ED Triage Notes (Signed)
  Patient BIB [facility] after mechanical fall at SNF.  Patient was using walker to ambulate to restroom and got it caught in doorway and fell.  Patient has hematoma to R eye.  Skin tears on R arm and 3 in laceration to L leg.  Patient had no LOC.  A&O x4.  Patient is on blood thinners.

## 2017-12-29 NOTE — ED Notes (Signed)
PA at bedside evaluating pt., explained plan of care to pt. and family.

## 2017-12-30 DIAGNOSIS — S0083XA Contusion of other part of head, initial encounter: Secondary | ICD-10-CM | POA: Diagnosis not present

## 2017-12-30 MED ORDER — LIDOCAINE HCL (PF) 1 % IJ SOLN
INTRAMUSCULAR | Status: AC
  Filled 2017-12-30: qty 5

## 2017-12-30 MED ORDER — "THROMBI-PAD 3""X3"" EX PADS"
1.0000 | MEDICATED_PAD | Freq: Once | CUTANEOUS | Status: DC
  Filled 2017-12-30: qty 1

## 2017-12-30 NOTE — Discharge Instructions (Signed)
Keep your dressings in place for at least the next 24 hours.  After this time, you may change your dressing once per day to keep the area clean and dry.  Avoid soaking your wounds in water such as while bathing.  Avoid swimming.  You may take Tylenol for pain.  Should you experience worsening bleeding, we recommend application of firm and constant pressure for at least 5 to 10 minutes.  You may return to the emergency department for new or concerning symptoms.

## 2017-12-30 NOTE — ED Notes (Signed)
  Patient ambulated well with walker.  Required minimal assistance.  Patient had no complaints of dizziness or pain.

## 2017-12-30 NOTE — ED Provider Notes (Signed)
[facility] EMERGENCY DEPARTMENT Provider Note   CSN: [id number] Arrival date & time: 12/29/17  2219     History   Chief Complaint Chief Complaint  Patient presents with  . Fall  . Laceration    HPI Angela Hines is a 84 y.o. female.  84 year old female with a history of atrial fibrillation, on chronic Xarelto, CHF, COPD, diabetes, dyslipidemia, hypertension presents to the emergency department from skilled nursing facility.  Only reports balance issues at baseline.  Patient usually ambulates with walker.  She states that she was ambulating with her walker tonight when her walker caught on something causing the patient to trip and fall.  She states that the walker fell on top of her.  She did not have any loss of consciousness.  Did strike the area by her right eyebrow.  Denies any headache or subsequent nausea or vomiting.  Sustained lacerations to her left lower extremity as well as her right arm.  Family at bedside dates that patient is acting per baseline.     Past Medical History:  Diagnosis Date  . Atrial fibrillation ([place])   . CHF (congestive heart failure) ([place])   . COPD (chronic obstructive pulmonary disease) ([place])   . Diabetes mellitus without complication ([place])   . High cholesterol   . Hypertension     Patient Active Problem List   Diagnosis Date Noted  . Diabetes mellitus without complication ([place]) 66/09/3014  . Depression with anxiety 12/17/2017  . UTI (urinary tract infection) 12/17/2017  . Hypokalemia 12/17/2017  . Intussusception ([place])   . Falls frequently 12/14/2017  . Paracetamol (acetaminophen) overdose, accidental or unintentional, initial encounter 11/21/2017  . Nausea & vomiting 09/10/2017  . Chest pain 09/10/2017  . High anion gap metabolic acidosis 05/02/3233  . Chronic systolic CHF (congestive heart failure) ([place]) 09/10/2017  . Protein-calorie malnutrition, severe 06/11/2017  . Prolonged QT interval   . COPD (chronic  obstructive pulmonary disease) ([place]) 07/17/2016  . Diabetes mellitus ([place]) 07/17/2016  . Gastroesophageal reflux disease without esophagitis 07/17/2016  . Generalized anxiety disorder 07/17/2016  . Hypercholesterolemia 07/17/2016  . Hypertension 07/17/2016  . Status post placement of implantable loop recorder 07/17/2016  . AF (paroxysmal atrial fibrillation) ([place]) 07/17/2016  . Primary insomnia 07/17/2016  . S/P AVR (aortic valve replacement) 07/17/2016  . History of syncope 08/07/2014    Past Surgical History:  Procedure Laterality Date  . CARDIAC PACEMAKER PLACEMENT  07/30/2014  . 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 CARDIOVERSION N/A 06/15/2017   Procedure: CARDIOVERSION;  Surgeon: Lelon Perla, MD;  Location: [facility] ENDOSCOPY;  Service: Cardiovascular;  Laterality: N/A;  . CHOLECYSTECTOMY    . EXCISIONAL HEMORRHOIDECTOMY    . LOOP RECORDER INSERTION N/A 08/22/2017   Procedure: LOOP RECORDER INSERTION;  Surgeon: [name], MD;  Location: [place] CV LAB;  Service: Cardiovascular;  Laterality: N/A;  . LOOP RECORDER REMOVAL N/A 08/22/2017   Procedure: LOOP RECORDER REMOVAL;  Surgeon: [name], MD;  Location: [place] CV LAB;  Service: Cardiovascular;  Laterality: N/A;     OB History   None      Home Medications    Prior to Admission medications   Medication Sig Start Date End Date Taking? Authorizing Provider  albuterol (PROVENTIL HFA;VENTOLIN HFA) 108 (90 Base) MCG/ACT inhaler Inhale 2 puffs into the lungs every 6 (six) hours as needed for shortness of breath.    [provider]  amiodarone (PACERONE) 100 MG tablet  Take 100 mg by mouth daily.    [provider]  amLODipine (NORVASC) 2.5 MG tablet Take 1 tablet (2.5 mg total) by mouth daily. 10/23/17 01/21/18  Daune Perch, NP  bismuth subsalicylate (PEPTO BISMOL) 262 MG/15ML suspension Take 30 mLs by mouth every 6 (six) hours as needed for indigestion.    [provider]  buPROPion (WELLBUTRIN XL) 300 MG 24 hr tablet Take 150 mg by mouth daily.  06/20/17   [provider]  docusate sodium (COLACE) 100 MG capsule Take 1 capsule (100 mg total) by mouth 2 (two) times daily. 12/20/17 01/19/18  Donne Hazel, MD  feeding supplement, ENSURE ENLIVE, (ENSURE ENLIVE) LIQD Take 237 mLs by mouth 3 (three) times daily between meals. Patient taking differently: Take 237 mLs by mouth daily.  06/12/17   Lavina Hamman, MD  furosemide (LASIX) 20 MG tablet Take 1 tablet (20 mg total) by mouth daily. 10/23/17   Daune Perch, NP  gabapentin (NEURONTIN) 300 MG capsule Take 600 mg by mouth at bedtime.  07/17/16   [provider]  ibuprofen (ADVIL,MOTRIN) 200 MG tablet Take 400 mg by mouth daily as needed for headache.    [provider]  LORazepam (ATIVAN) 0.5 MG tablet Take 1 tablet (0.5 mg total) by mouth 3 (three) times daily as needed for anxiety. Patient taking differently: Take 0.5 mg by mouth 3 (three) times daily. 1 tablet in the morning, 1 tablet in the evening and 2 tablets every night at bedtime 11/14/17   Charlann Lange, PA-C  magnesium oxide (MAG-OX) 400 (241.3 Mg) MG tablet Take 1 tablet (400 mg total) by mouth daily. 05/24/17   Nita Sells, MD  metFORMIN (GLUCOPHAGE) 1000 MG tablet Take 1,000 mg by mouth daily. 12/01/16   [provider]  metoprolol tartrate (LOPRESSOR) 25 MG tablet Take 25 mg by mouth 2 (two) times daily.    [provider]  omeprazole (PRILOSEC) 40 MG capsule Take 40 mg by mouth daily.     [provider]  polyethylene glycol (MIRALAX / GLYCOLAX) packet Take 17 g by mouth daily as needed for mild constipation. 06/12/17   Lavina Hamman, MD  polyethylene glycol [facility] / Floria Raveling) packet Take 17 g by mouth daily. 12/20/17   Donne Hazel, MD  Rivaroxaban (XARELTO) 15 MG TABS tablet Take 15 mg by mouth daily with supper. 07/17/16   [provider]    Family History Family History    Problem Relation Age of Onset  . Emphysema Father   . Hypertension Sister   . Lung disease Sister   . Hypertension Brother   . Hypertension Brother   . Stroke Brother   . Other Son        one spleen removed, one with chronic pain from MVA  . Diabetes Son   . Multiple myeloma Son   . Heart disease Son   . Heart attack Son     Social History Social History   Tobacco Use  . Smoking status: Never Smoker  . Smokeless tobacco: Never Used  Substance Use Topics  . Alcohol use: No  . Drug use: No     Allergies   Clonidine; Captopril; Codeine; Ampicillin; and Ceclor [cefaclor]   Review of Systems Review of Systems Ten systems reviewed and are negative for acute change, except as noted in the HPI.    Physical Exam Updated Vital Signs BP (!) 103/58 (BP Location: Right Arm)   Pulse 69   Temp 98  F (36.7 C) (Oral)   Resp 16   SpO2 98%   Physical Exam  Constitutional: She is oriented to person, place, and time. She appears well-developed and well-nourished. No distress.  Frail-appearing, nontoxic.  Patient in no acute distress.  HENT:  Head: Normocephalic.    No skull inability or crepitus.  Eyes: Pupils are equal, round, and reactive to light. Conjunctivae and EOM are normal. No scleral icterus.  Neck: Normal range of motion.  Normal ROM  Pulmonary/Chest: Effort normal. No respiratory distress.  Respirations even and unlabored  Musculoskeletal: Normal range of motion.       Right forearm: She exhibits laceration.       Left lower leg: She exhibits laceration.  Neurological: She is alert and oriented to person, place, and time. She exhibits normal muscle tone. Coordination normal.  GCS 15. Speech is goal oriented. No cranial nerve deficits appreciated; symmetric eyebrow raise, no facial drooping, tongue midline. Patient has equal grip strength bilaterally with 5/5 strength against resistance in all major muscle groups bilaterally. Sensation to light touch intact.  Patient moves extremities without ataxia.  Skin: Skin is warm and dry. No rash noted. She is not diaphoretic. No erythema. No pallor.  Deep avulsion to the posterior aspect of the distal LLE. Bleeding controlled with pressure.  Skin tear x 2 to the right forearm; more proximal skin tear with persistent bleeding despite pressure.  No palpable, pulsatile bleeding.  Psychiatric: She has a normal mood and affect. Her behavior is normal.  Nursing note and vitals reviewed.    ED Treatments / Results  Labs (all labs ordered are listed, but only abnormal results are displayed) Labs Reviewed  CBC WITH DIFFERENTIAL/PLATELET - Abnormal; Notable for the following components:      Result Value   WBC 10.7 (*)    RBC 3.19 (*)    Hemoglobin 8.1 (*)    HCT 28.1 (*)    MCH 25.4 (*)    MCHC 28.8 (*)    RDW 16.3 (*)    All other components within normal limits  BASIC METABOLIC PANEL - Abnormal; Notable for the following components:   Glucose, Bld 116 (*)    Creatinine, Ser 1.03 (*)    Calcium 8.8 (*)    GFR calc non Af Amer 49 (*)    GFR calc Af Amer 57 (*)    All other components within normal limits    EKG None  Radiology No results found.  Procedures Procedures (including critical care time)  LACERATION REPAIR Performed by: Antonietta Breach Authorized by: Antonietta Breach Consent: Verbal consent obtained. Risks and benefits: risks, benefits and alternatives were discussed Consent given by: patient Patient identity confirmed: provided demographic data Prepped and Draped in normal sterile fashion Wound explored  Laceration Location: R forearm  Laceration Length: 4cm  No Foreign Bodies seen or palpated  Anesthesia: local infiltration  Local anesthetic: lidocaine 2% without epinephrine  Anesthetic total: 5 ml  Irrigation method: syringe Amount of cleaning: standard  Skin closure: 4-0 vicryl  Number of sutures: 2  Technique: figure 8  Patient tolerance: Patient tolerated the  procedure well with no immediate complications.   Medications Ordered in ED Medications - No data to display   11:30 PM Patient presents following fall.  She has a very small hematoma to her lateral right eyebrow.  No battle sign or raccoon's eyes.  No other evidence of significant head injury.  The patient denies headache.  Her neurologic exam is reassuring and nonfocal.  The patient is on chronic Xarelto.  Have discussed CT imaging with the patient and family.  Have explained that a CT scan would be the only way to definitively exclude acute traumatic intracranial process.  Given that she is acting at baseline without any physical complaints, my suspicion for intracranial abnormality is low.  Patient and family have decided to withhold from CT scan at this time.  They verbalized understanding of the fact that this may cause the patient to decline if intracranial bleed or other traumatic process unidentified.  Patient has the capacity to make this decision.  Have discussed that she may always return to the emergency department if her symptoms or mentation worsen.  2:30 AM Ambulatory well with walker in the emergency department.  No additional assistance required.   Initial Impression / Assessment and Plan / ED Course  I have reviewed the triage vital signs and the nursing notes.  Pertinent labs & imaging results that were available during my care of the patient were reviewed by me and considered in my medical decision making (see chart for details).     84 year old female presents to the emergency department following fall.  She sustained a deep skin avulsion to her posterior distal left lower extremity as well as skin tears to her right arm.  Required figure-of-eight sutures to her right forearm to manage bleeding of small superficial veins given that she is on Xarelto.  Wounds were then dressed with thrombin pad and [place] pressure dressing.  The patient has a reassuring physical exam.   Neurologic exam is nonfocal.  She is able to ambulate in the emergency department with use of a walker which is her baseline.  Have discussed CT imaging with the patient and family.  They have decided to forego imaging today.  The patient has been instructed to return for any new or concerning symptoms.  Return precautions discussed and provided. Patient discharged in stable condition with no unaddressed concerns.   Final Clinical Impressions(s) / ED Diagnoses   Final diagnoses:  Contusion of forehead, initial encounter  Lacerations of multiple sites of right arm, initial encounter  Avulsion of skin of lower leg, left, initial encounter    ED Discharge Orders    None       Antonietta Breach, PA-C 12/31/17 0545    Merryl Hacker, MD 01/01/18 [phone]

## 2017-12-31 LAB — CUP PACEART REMOTE DEVICE CHECK
Date Time Interrogation Session: 20190728021016
MDC IDC PG IMPLANT DT: 20190501

## 2017-12-31 MED FILL — Thrombin-Sodium Carboxymethylcellulose-CaCl Pad 3" X 3": CUTANEOUS | Qty: 1 | Status: AC

## 2018-01-08 ENCOUNTER — Telehealth: Payer: Self-pay | Admitting: Cardiology

## 2018-01-08 NOTE — Telephone Encounter (Signed)
Spoke w/ pt and requested that she send a manual transmission b/c her home monitor has not updated in at least 14 days.   

## 2018-01-16 ENCOUNTER — Encounter: Payer: Self-pay | Admitting: Neurology

## 2018-01-16 ENCOUNTER — Ambulatory Visit (INDEPENDENT_AMBULATORY_CARE_PROVIDER_SITE_OTHER): Payer: Medicare Other | Admitting: Neurology

## 2018-01-16 VITALS — BP 156/73 | HR 63 | Ht 67.0 in | Wt 108.0 lb

## 2018-01-16 DIAGNOSIS — G250 Essential tremor: Secondary | ICD-10-CM | POA: Diagnosis not present

## 2018-01-16 LAB — CUP PACEART REMOTE DEVICE CHECK
Date Time Interrogation Session: 20190830023958
MDC IDC PG IMPLANT DT: 20190501

## 2018-01-16 NOTE — Progress Notes (Signed)
Subjective:    Patient ID: Angela Hines is a 84 y.o. female.  HPI     Star Age, MD, PhD [facility] [address] [place], [place] [address]  Dear Dr. Laurann Montana,   I saw your patient, Angela Hines, upon your kind request in my neurologic clinic today for initial consultation of her tremor and gait imbalance issues. The patient is accompanied by her son and daughter-in-law today. As you know, Angela Hines is an 84 year old right-handed woman with an underlying complex medical history of A. fib, on blood thinner, hypertension, hyperlipidemia, anemia, hypokalemia, anxiety, depression, heart disease with status post aortic valve replacement, pacemaker placement (?), CHF, reflux disease, COPD, diabetes, malnutrition and underweight state, recent hospitalization in August 2019 for nausea and vomiting, found to have possible jejunal intussusception and UTI, status post admission to the hospital in August 2019 secondary to Tylenol overdose, status post ER visit in September 2019 after fall, who reports a long-standing history of hand tremors. It has become worse with time. She has difficulty feeding herself and also difficulty with her handwriting. [facility]. As I understand, she is on the wait list for assisted living. Currently, her medications are given to her by a caretaker. She is widowed and has 3 children. She is a nonsmoker and drinks all call rarely, caffeine in the form of coffee, one cup per day on average. I reviewed your office note from 10/29/2017, which you kindly included. Of note, she is a multiple potentially sedating medications including gabapentin 300 mg twice a day, lorazepam up to 4 times a day. In addition, she is on Wellbutrin long-acting 300 mg once daily.  Her mother lived to be 18 years old. Mother had hand tremors. She has a sister with tremors. She also has 2 half-brothers. She has a loop recorder in place.  This was replaced in May 2019. She has no pacemaker in place. Her chart states pacemaker placement in 2016. She has had multiple recent head CT scans. Her head CT without contrast on 11/21/2017 showed: IMPRESSION: 1. No evidence of acute intracranial abnormality 2. Atrophy and chronic small-vessel white matter ischemic changes.  She had a head CT and cervical spine CT without contrast on 11/13/2017 after a fall: IMPRESSION: 1. No acute intracranial abnormalities. Mild cerebral atrophy and old lacunar infarcts in the deep white matter. 2. Unchanged alignment of the cervical spine since previous study. Prominent diffuse degenerative changes. No acute displaced fractures identified. 3. Emphysematous changes, fibrosis, and nodular scarring in the lung apices.   She had a head CT and cervical spine CT without contrast on 09/08/2017 after a fall: IMPRESSION: 1. No intracranial trauma. 2. Small LEFT frontal scalp hematoma. 3. No skull fracture. 4. No cervical spine fracture 5. Multilevel disc osteophytic disease. 6. Chronic appearing interstitial lung disease in the upper lobes. Findings comparable to chest radiograph 06/09/2017.   Her Past Medical History Is Significant For: Past Medical History:  Diagnosis Date  . Anxiety   . Aortic stenosis   . Atrial fibrillation ([place])   . CHF (congestive heart failure) ([place])   . CHF (congestive heart failure) ([place])   . COPD (chronic obstructive pulmonary disease) ([place])   . COPD (chronic obstructive pulmonary disease) ([place])   . Depressed affect   . Diabetes mellitus without complication ([place])   . High cholesterol   . Hypertension   . Paroxysmal atrial fibrillation [facility])     Her Past Surgical History Is  Significant For: Past Surgical History:  Procedure Laterality Date  . CARDIAC PACEMAKER PLACEMENT  07/30/2014  . 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 CARDIOVERSION N/A 06/15/2017   Procedure: CARDIOVERSION;  Surgeon:  Lelon Perla, MD;  Location: [facility] ENDOSCOPY;  Service: Cardiovascular;  Laterality: N/A;  . CHOLECYSTECTOMY    . EXCISIONAL HEMORRHOIDECTOMY    . LOOP RECORDER INSERTION N/A 08/22/2017   Procedure: LOOP RECORDER INSERTION;  Surgeon: [name], MD;  Location: [place] CV LAB;  Service: Cardiovascular;  Laterality: N/A;  . LOOP RECORDER REMOVAL N/A 08/22/2017   Procedure: LOOP RECORDER REMOVAL;  Surgeon: [name], MD;  Location: [place] [facility];  Service: Cardiovascular;  Laterality: N/A;    Her Family History Is Significant For: Family History  Problem Relation Age of Onset  . Emphysema Father   . Hypertension Sister   . Lung disease Sister   . Hypertension Brother   . Hypertension Brother   . Stroke Brother   . Other Son        one spleen removed, one with chronic pain from MVA  . Diabetes Son   . Multiple myeloma Son   . Heart disease Son   . Heart attack Son     Her Social History Is Significant For: Social History   Socioeconomic History  . Marital status: Single    Spouse name: Not on file  . Number of children: Not on file  . Years of education: Not on file  . Highest education level: Not on file  Occupational History  . Not on file  Social Needs  . Financial resource strain: Not on file  . Food insecurity:    Worry: Not on file    Inability: Not on file  . Transportation needs:    Medical: Not on file    Non-medical: Not on file  Tobacco Use  . Smoking status: Never Smoker  . Smokeless tobacco: Never Used  Substance and Sexual Activity  . Alcohol use: No  . Drug use: No  . Sexual activity: Never  Lifestyle  . Physical activity:    Days per week: Not on file    Minutes per session: Not on file  . Stress: Not on file  Relationships  . Social connections:    Talks on phone: Not on file    Gets together: Not on file    Attends religious service: Not on file    Active member of club or organization: Not on file    Attends meetings of clubs  or organizations: Not on file    Relationship status: Not on file  Other Topics Concern  . Not on file  Social History Narrative  . Not on file    Her Allergies Are:  Allergies  Allergen Reactions  . Clonidine Swelling and Other (See Comments)    "head swelling"  . Captopril Swelling  . Codeine Nausea And Vomiting and Other (See Comments)    GI Intolerance   . Ampicillin Rash  . Ceclor [Cefaclor] Rash  :   Her Current Medications Are:  Outpatient Encounter Medications as of 01/16/2018  Medication Sig  . albuterol (PROVENTIL HFA;VENTOLIN HFA) 108 (90 Base) MCG/ACT inhaler Inhale 2 puffs into the lungs every 6 (six) hours as needed for shortness of breath.  Marland Kitchen amiodarone (PACERONE) 100 MG tablet Take 100 mg by mouth daily.  Marland Kitchen amLODipine (NORVASC) 2.5 MG tablet Take 1 tablet (2.5 mg total) by mouth daily.  Marland Kitchen  bismuth subsalicylate (PEPTO BISMOL) 262 MG/15ML suspension Take 30 mLs by mouth every 6 (six) hours as needed for indigestion.  Marland Kitchen buPROPion (WELLBUTRIN XL) 300 MG 24 hr tablet Take 300 mg by mouth daily.   Marland Kitchen docusate sodium (COLACE) 100 MG capsule Take 1 capsule (100 mg total) by mouth 2 (two) times daily.  . feeding supplement, ENSURE ENLIVE, (ENSURE ENLIVE) LIQD Take 237 mLs by mouth 3 (three) times daily between meals. (Patient taking differently: Take 237 mLs by mouth daily. )  . furosemide (LASIX) 20 MG tablet Take 1 tablet (20 mg total) by mouth daily.  Marland Kitchen gabapentin (NEURONTIN) 300 MG capsule Take 600 mg by mouth at bedtime.   Marland Kitchen ibuprofen (ADVIL,MOTRIN) 200 MG tablet Take 400 mg by mouth daily as needed for headache.  Marland Kitchen LORazepam (ATIVAN) 0.5 MG tablet Take 1 tablet (0.5 mg total) by mouth 3 (three) times daily as needed for anxiety. (Patient taking differently: Take 0.5 mg by mouth 3 (three) times daily. 1 tablet in the morning, 1 tablet in the evening and 2 tablets every night at bedtime)  . magnesium oxide (MAG-OX) 400 (241.3 Mg) MG tablet Take 1 tablet (400 mg total) by  mouth daily.  . metFORMIN (GLUCOPHAGE) 1000 MG tablet Take 1,000 mg by mouth daily.  . metoprolol tartrate (LOPRESSOR) 25 MG tablet Take 25 mg by mouth 2 (two) times daily.  Marland Kitchen omeprazole (PRILOSEC) 40 MG capsule Take 40 mg by mouth daily.   . polyethylene glycol (MIRALAX / GLYCOLAX) packet Take 17 g by mouth daily as needed for mild constipation.  . polyethylene glycol (MIRALAX / GLYCOLAX) packet Take 17 g by mouth daily.  . Rivaroxaban (XARELTO) 15 MG TABS tablet Take 15 mg by mouth daily with supper.   No facility-administered encounter medications on file as of 01/16/2018.   :  Review of Systems:  Out of a complete 14 point review of systems, all are reviewed and negative with the exception of these symptoms as listed below: Review of Systems  Neurological:       Pt presents today to discuss her worsening tremor. Pt notices it in both of her hands but it seems to be worse in her right hand, per her report. Pt is right handed.    Objective:  Neurological Exam  Physical Exam Physical Examination:   Vitals:   01/16/18 1440  BP: (!) 156/73  Pulse: 63    General Examination: The patient is a very pleasant 84 y.o. female in no acute distress. She appears frail and underweight. Well groomed.   HEENT: Normocephalic, atraumatic, pupils are equal, round and reactive to light and accommodation. Extraocular tracking is good without limitation to gaze excursion or nystagmus noted. Normal smooth pursuit is noted. Hearing is grossly intact. Face is symmetric with normal facial animation and normal facial sensation. Speech is clear with no dysarthria noted. There is no hypophonia. There is no lip, neck/head, jaw or voice tremor. Neck is supple with full range of passive and active motion. There are no carotid bruits on auscultation. Oropharynx exam reveals: mild mouth dryness, adequate dental hygiene. Tongue protrudes centrally and palate elevates symmetrically.  Chest: Clear to auscultation  without wheezing, rhonchi or crackles noted.  Heart: S1+S2+0, regular and normal without murmurs, rubs or gallops noted.   Abdomen: Soft, non-tender and non-distended with normal bowel sounds appreciated on auscultation.  Extremities: There is no pitting edema in the distal lower extremities bilaterally.   Skin: Warm and dry without trophic changes noted, chronic  bruising.  Musculoskeletal: exam reveals no obvious joint deformities, tenderness or joint swelling or erythema, bandaged R forearm.   Neurologically:  Mental status: The patient is awake, alert and oriented in all 4 spheres. Her immediate and remote memory, attention, language skills and fund of knowledge are appropriate. There is no evidence of aphasia, agnosia, apraxia or anomia. Speech is clear with normal prosody and enunciation. Thought process is linear. Mood is normal and affect is normal.  Cranial nerves II - XII are as described above under HEENT exam. In addition: shoulder shrug is normal with equal shoulder height noted. Motor exam: thin bulk, global strength of 4/5, and N tone is noted. There is no drift, resting tremor or rebound. Romberg is not tested due to safety concerns.  On 01/16/2018:on Archimedes spiral drawing she has trembling with the right hand and a mild degree and moderate to severe with the left. Handwriting with the right hand which is the dominant hand is legible, tremulous. She has a mild b/l UE postural and action tremor.    Reflexes are 1+ throughout. Fine motor skills and coordination: globally mildly impaired in the upper and lower extremities distally. Cerebellar testing: No dysmetria or intention tremor. There is no truncal or gait ataxia.  Sensory exam: intact to light touch in the upper and lower extremities.  Gait, station and balance: she stands slowly and uses a rolling walker.               Assessment and Plan:    In summary, Tandrea Kommer is a very pleasant 84 y.o.-year old female with  an underlying complex medical history of A. fib, on blood thinner, hypertension, hyperlipidemia, anemia, hypokalemia, anxiety, depression, heart disease with status post aortic valve replacement, pacemaker placement (?), CHF, reflux disease, COPD, diabetes, malnutrition and underweight state, recent hospitalization in August 2019 for nausea and vomiting, found to have possible jejunal intussusception and UTI, status post admission to the hospital in August 2019 secondary to Tylenol overdose, status post ER visit in September 2019 after fall, who presents for evaluation of her tremor. Her history and examination is in keeping with essential tremor. We talked about this diagnosis and progression. Unfortunately, symptomatic medication options are limited. In her particular case her I would not add any new medications for fear of side effects and interaction. In fact, she is on some sedating medications already at this point. I would be rather cautious with any medication changes. She has had several falls and I explained to them that she may in part be at fall risk because of deconditioning, age, but also medication side effects such as gabapentin and her lorazepam. I suggested she talk to her home health occupational therapist about her issues with ADLs and feeding in particular. She may benefit from weighted utensils. Her family is encouraged to look into that online as well. She is advised to stay well-hydrated and well-nourished and try to supplement her nutrition with ensure at least 3 times a day. I will see her back as needed. I answered all their questions today and the patient and her son and daughter-in-law were in agreement. Thank you very much for allowing me to participate in the care of this nice patient. If I can be of any further assistance to you please do not hesitate to call me at [phone].  Sincerely,   Star Age, MD, PhD

## 2018-01-16 NOTE — Patient Instructions (Addendum)
Please talk to your home health occupational therapist about ways to help manage the hand tremors. They may make suggestions regarding weighted utensils.  I would not recommend any additional medications for tremor, in fact, you are on potentially sedating medications and are at fall risk. I would recommend you transition to assisted Living.  Please stay well hydrated with water, add Ensure regularly.  I am afraid, there is not a whole lot I can offer you.  I will see you back if needed.

## 2018-01-22 ENCOUNTER — Ambulatory Visit (INDEPENDENT_AMBULATORY_CARE_PROVIDER_SITE_OTHER): Payer: Medicare Other | Admitting: *Deleted

## 2018-01-22 DIAGNOSIS — I480 Paroxysmal atrial fibrillation: Secondary | ICD-10-CM

## 2018-01-23 LAB — CUP PACEART REMOTE DEVICE CHECK
Date Time Interrogation Session: 20191002060936
MDC IDC PG IMPLANT DT: 20190501

## 2018-01-23 NOTE — Progress Notes (Signed)
Carelink Summary Report / Loop Recorder 

## 2018-01-24 ENCOUNTER — Telehealth (HOSPITAL_COMMUNITY): Payer: Self-pay | Admitting: *Deleted

## 2018-01-24 NOTE — Telephone Encounter (Signed)
Patients son called in stating pt was having issues with low bp (70-90/40s) beginning of week very lethargic so PCP stopped her metoprolol. This morning her BP is 137/90 and her HR is in the 110s. Pt feeling much better just tired. Discussed with Chanetta Marshall, NP will resume metoprolol to 1/2 tablet (12.5mg ) twice a day and stop amlodipine. She has follow up with Dr. Laurann Montana her PCP on 10/9. Son will call back if HRs do not respond to resuming metoprolol.

## 2018-01-29 ENCOUNTER — Telehealth: Payer: Self-pay | Admitting: *Deleted

## 2018-01-29 NOTE — Telephone Encounter (Signed)
LMOVM for [name]/Hetty Overacker (DPR). Requested manual transmission from patient's Carelink monitor. [place] phone number if they need assistance transmitting.  Received alert on 10/4 regarding AF episodes noted on LINQ and elevated V rates during AF. Was awaiting automatic update for review of overall burden and rhythm, but monitor has not automatically updated since 10/4. Patient has PCP f/u on 01/30/18 per phone note from 01/24/18.

## 2018-01-31 ENCOUNTER — Inpatient Hospital Stay (HOSPITAL_COMMUNITY)
Admission: EM | Admit: 2018-01-31 | Discharge: 2018-02-05 | DRG: 314 | Disposition: A | Discharge status: Skilled Nursing Facility | Payer: Medicare Other | Attending: Internal Medicine | Admitting: Internal Medicine

## 2018-01-31 ENCOUNTER — Other Ambulatory Visit: Payer: Self-pay

## 2018-01-31 ENCOUNTER — Encounter (HOSPITAL_COMMUNITY): Payer: Self-pay

## 2018-01-31 ENCOUNTER — Emergency Department (HOSPITAL_COMMUNITY): Payer: Medicare Other

## 2018-01-31 DIAGNOSIS — R0602 Shortness of breath: Secondary | ICD-10-CM | POA: Diagnosis not present

## 2018-01-31 DIAGNOSIS — E119 Type 2 diabetes mellitus without complications: Secondary | ICD-10-CM | POA: Diagnosis present

## 2018-01-31 DIAGNOSIS — J9621 Acute and chronic respiratory failure with hypoxia: Secondary | ICD-10-CM | POA: Diagnosis not present

## 2018-01-31 DIAGNOSIS — Z885 Allergy status to narcotic agent status: Secondary | ICD-10-CM | POA: Diagnosis not present

## 2018-01-31 DIAGNOSIS — Z8249 Family history of ischemic heart disease and other diseases of the circulatory system: Secondary | ICD-10-CM

## 2018-01-31 DIAGNOSIS — I509 Heart failure, unspecified: Secondary | ICD-10-CM | POA: Diagnosis not present

## 2018-01-31 DIAGNOSIS — Z7984 Long term (current) use of oral hypoglycemic drugs: Secondary | ICD-10-CM

## 2018-01-31 DIAGNOSIS — I5022 Chronic systolic (congestive) heart failure: Secondary | ICD-10-CM | POA: Diagnosis not present

## 2018-01-31 DIAGNOSIS — T826XXA Infection and inflammatory reaction due to cardiac valve prosthesis, initial encounter: Secondary | ICD-10-CM | POA: Diagnosis present

## 2018-01-31 DIAGNOSIS — R7881 Bacteremia: Secondary | ICD-10-CM | POA: Diagnosis not present

## 2018-01-31 DIAGNOSIS — R6520 Severe sepsis without septic shock: Secondary | ICD-10-CM

## 2018-01-31 DIAGNOSIS — Z833 Family history of diabetes mellitus: Secondary | ICD-10-CM

## 2018-01-31 DIAGNOSIS — K219 Gastro-esophageal reflux disease without esophagitis: Secondary | ICD-10-CM | POA: Diagnosis present

## 2018-01-31 DIAGNOSIS — J9600 Acute respiratory failure, unspecified whether with hypoxia or hypercapnia: Secondary | ICD-10-CM

## 2018-01-31 DIAGNOSIS — F419 Anxiety disorder, unspecified: Secondary | ICD-10-CM | POA: Diagnosis present

## 2018-01-31 DIAGNOSIS — Z7901 Long term (current) use of anticoagulants: Secondary | ICD-10-CM

## 2018-01-31 DIAGNOSIS — Z952 Presence of prosthetic heart valve: Secondary | ICD-10-CM

## 2018-01-31 DIAGNOSIS — Z515 Encounter for palliative care: Secondary | ICD-10-CM | POA: Diagnosis not present

## 2018-01-31 DIAGNOSIS — Z825 Family history of asthma and other chronic lower respiratory diseases: Secondary | ICD-10-CM

## 2018-01-31 DIAGNOSIS — I5042 Chronic combined systolic (congestive) and diastolic (congestive) heart failure: Secondary | ICD-10-CM | POA: Diagnosis present

## 2018-01-31 DIAGNOSIS — E7800 Pure hypercholesterolemia, unspecified: Secondary | ICD-10-CM | POA: Diagnosis present

## 2018-01-31 DIAGNOSIS — B955 Unspecified streptococcus as the cause of diseases classified elsewhere: Secondary | ICD-10-CM | POA: Diagnosis not present

## 2018-01-31 DIAGNOSIS — Z807 Family history of other malignant neoplasms of lymphoid, hematopoietic and related tissues: Secondary | ICD-10-CM

## 2018-01-31 DIAGNOSIS — A408 Other streptococcal sepsis: Secondary | ICD-10-CM | POA: Diagnosis present

## 2018-01-31 DIAGNOSIS — I350 Nonrheumatic aortic (valve) stenosis: Secondary | ICD-10-CM | POA: Diagnosis present

## 2018-01-31 DIAGNOSIS — Z66 Do not resuscitate: Secondary | ICD-10-CM | POA: Diagnosis present

## 2018-01-31 DIAGNOSIS — Z9049 Acquired absence of other specified parts of digestive tract: Secondary | ICD-10-CM | POA: Diagnosis not present

## 2018-01-31 DIAGNOSIS — Z681 Body mass index (BMI) 19 or less, adult: Secondary | ICD-10-CM | POA: Diagnosis not present

## 2018-01-31 DIAGNOSIS — A400 Sepsis due to streptococcus, group A: Secondary | ICD-10-CM | POA: Diagnosis not present

## 2018-01-31 DIAGNOSIS — J449 Chronic obstructive pulmonary disease, unspecified: Secondary | ICD-10-CM | POA: Diagnosis not present

## 2018-01-31 DIAGNOSIS — Z881 Allergy status to other antibiotic agents status: Secondary | ICD-10-CM

## 2018-01-31 DIAGNOSIS — Z7189 Other specified counseling: Secondary | ICD-10-CM

## 2018-01-31 DIAGNOSIS — Z888 Allergy status to other drugs, medicaments and biological substances status: Secondary | ICD-10-CM

## 2018-01-31 DIAGNOSIS — A419 Sepsis, unspecified organism: Secondary | ICD-10-CM | POA: Diagnosis present

## 2018-01-31 DIAGNOSIS — R011 Cardiac murmur, unspecified: Secondary | ICD-10-CM | POA: Diagnosis not present

## 2018-01-31 DIAGNOSIS — R296 Repeated falls: Secondary | ICD-10-CM | POA: Diagnosis present

## 2018-01-31 DIAGNOSIS — Z950 Presence of cardiac pacemaker: Secondary | ICD-10-CM

## 2018-01-31 DIAGNOSIS — J9601 Acute respiratory failure with hypoxia: Secondary | ICD-10-CM | POA: Diagnosis not present

## 2018-01-31 DIAGNOSIS — J441 Chronic obstructive pulmonary disease with (acute) exacerbation: Secondary | ICD-10-CM | POA: Diagnosis present

## 2018-01-31 DIAGNOSIS — J9622 Acute and chronic respiratory failure with hypercapnia: Secondary | ICD-10-CM

## 2018-01-31 DIAGNOSIS — I110 Hypertensive heart disease with heart failure: Secondary | ICD-10-CM | POA: Diagnosis present

## 2018-01-31 DIAGNOSIS — I480 Paroxysmal atrial fibrillation: Secondary | ICD-10-CM | POA: Diagnosis present

## 2018-01-31 DIAGNOSIS — Z953 Presence of xenogenic heart valve: Secondary | ICD-10-CM

## 2018-01-31 DIAGNOSIS — D638 Anemia in other chronic diseases classified elsewhere: Secondary | ICD-10-CM | POA: Diagnosis present

## 2018-01-31 DIAGNOSIS — J9620 Acute and chronic respiratory failure, unspecified whether with hypoxia or hypercapnia: Secondary | ICD-10-CM | POA: Diagnosis not present

## 2018-01-31 DIAGNOSIS — R0603 Acute respiratory distress: Secondary | ICD-10-CM

## 2018-01-31 DIAGNOSIS — Z823 Family history of stroke: Secondary | ICD-10-CM

## 2018-01-31 DIAGNOSIS — E43 Unspecified severe protein-calorie malnutrition: Secondary | ICD-10-CM | POA: Diagnosis present

## 2018-01-31 DIAGNOSIS — R627 Adult failure to thrive: Secondary | ICD-10-CM | POA: Diagnosis present

## 2018-01-31 DIAGNOSIS — B954 Other streptococcus as the cause of diseases classified elsewhere: Secondary | ICD-10-CM | POA: Diagnosis not present

## 2018-01-31 DIAGNOSIS — I38 Endocarditis, valve unspecified: Secondary | ICD-10-CM | POA: Diagnosis present

## 2018-01-31 HISTORY — DX: Depression, unspecified: F32.A

## 2018-01-31 HISTORY — DX: Type 2 diabetes mellitus without complications: E11.9

## 2018-01-31 HISTORY — DX: Major depressive disorder, single episode, unspecified: F32.9

## 2018-01-31 HISTORY — DX: Headache: R51

## 2018-01-31 HISTORY — DX: Headache, unspecified: R51.9

## 2018-01-31 HISTORY — DX: Pneumonia, unspecified organism: J18.9

## 2018-01-31 HISTORY — DX: Gastro-esophageal reflux disease without esophagitis: K21.9

## 2018-01-31 HISTORY — DX: Migraine, unspecified, not intractable, without status migrainosus: G43.909

## 2018-01-31 HISTORY — DX: Unspecified chronic bronchitis: J42

## 2018-01-31 HISTORY — DX: Angina pectoris, unspecified: I20.9

## 2018-01-31 HISTORY — DX: Unspecified osteoarthritis, unspecified site: M19.90

## 2018-01-31 LAB — BLOOD CULTURE ID PANEL (REFLEXED)
Acinetobacter baumannii: NOT DETECTED
CANDIDA ALBICANS: NOT DETECTED
CANDIDA TROPICALIS: NOT DETECTED
Candida glabrata: NOT DETECTED
Candida krusei: NOT DETECTED
Candida parapsilosis: NOT DETECTED
ENTEROBACTER CLOACAE COMPLEX: NOT DETECTED
ENTEROBACTERIACEAE SPECIES: NOT DETECTED
ENTEROCOCCUS SPECIES: NOT DETECTED
Escherichia coli: NOT DETECTED
HAEMOPHILUS INFLUENZAE: NOT DETECTED
Klebsiella oxytoca: NOT DETECTED
Klebsiella pneumoniae: NOT DETECTED
LISTERIA MONOCYTOGENES: NOT DETECTED
NEISSERIA MENINGITIDIS: NOT DETECTED
PSEUDOMONAS AERUGINOSA: NOT DETECTED
Proteus species: NOT DETECTED
STREPTOCOCCUS AGALACTIAE: NOT DETECTED
STREPTOCOCCUS PNEUMONIAE: NOT DETECTED
STREPTOCOCCUS PYOGENES: NOT DETECTED
STREPTOCOCCUS SPECIES: DETECTED — AB
Serratia marcescens: NOT DETECTED
Staphylococcus aureus (BCID): NOT DETECTED
Staphylococcus species: NOT DETECTED

## 2018-01-31 LAB — TROPONIN I
Troponin I: 0.05 ng/mL (ref <0.03)
Troponin I: 0.09 ng/mL (ref <0.03)

## 2018-01-31 LAB — CBC WITH DIFFERENTIAL/PLATELET
Abs Immature Granulocytes: 0.38 10*3/uL — ABNORMAL HIGH (ref 0.00–0.07)
BASOS PCT: 1 %
Band Neutrophils: 1 %
Basophils Absolute: 0.2 10*3/uL — ABNORMAL HIGH (ref 0.0–0.1)
Basophils Absolute: 0 10*3/uL (ref 0.0–0.1)
Basophils Relative: 0 %
EOS ABS: 0.4 10*3/uL (ref 0.0–0.5)
EOS PCT: 2 %
Eosinophils Absolute: 0 10*3/uL (ref 0.0–0.5)
Eosinophils Relative: 0 %
HCT: 28.7 % — ABNORMAL LOW (ref 36.0–46.0)
HCT: 30.2 % — ABNORMAL LOW (ref 36.0–46.0)
Hemoglobin: 7.8 g/dL — ABNORMAL LOW (ref 12.0–15.0)
Hemoglobin: 7.8 g/dL — ABNORMAL LOW (ref 12.0–15.0)
Immature Granulocytes: 2 %
Lymphocytes Relative: 31 %
Lymphocytes Relative: 2 %
Lymphs Abs: 5.6 10*3/uL — ABNORMAL HIGH (ref 0.7–4.0)
Lymphs Abs: 0.5 10*3/uL — ABNORMAL LOW (ref 0.7–4.0)
MCH: 23 pg — AB (ref 26.0–34.0)
MCH: 22.2 pg — ABNORMAL LOW (ref 26.0–34.0)
MCHC: 27.2 g/dL — ABNORMAL LOW (ref 30.0–36.0)
MCHC: 25.8 g/dL — ABNORMAL LOW (ref 30.0–36.0)
MCV: 84.7 fL (ref 80.0–100.0)
MCV: 86 fL (ref 80.0–100.0)
Monocytes Absolute: 0.7 10*3/uL (ref 0.1–1.0)
Monocytes Absolute: 0.9 10*3/uL (ref 0.1–1.0)
Monocytes Relative: 4 %
Monocytes Relative: 4 %
NRBC: 0 % (ref 0.0–0.2)
Neutro Abs: 11.2 10*3/uL — ABNORMAL HIGH (ref 1.7–7.7)
Neutro Abs: 22.8 10*3/uL — ABNORMAL HIGH (ref 1.7–7.7)
Neutrophils Relative %: 61 %
Neutrophils Relative %: 92 %
Platelets: 455 10*3/uL — ABNORMAL HIGH (ref 150–400)
Platelets: 412 10*3/uL — ABNORMAL HIGH (ref 150–400)
RBC: 3.39 MIL/uL — AB (ref 3.87–5.11)
RBC: 3.51 MIL/uL — ABNORMAL LOW (ref 3.87–5.11)
RDW: 17.3 % — AB (ref 11.5–15.5)
RDW: 17.2 % — ABNORMAL HIGH (ref 11.5–15.5)
WBC: 18.1 10*3/uL — ABNORMAL HIGH (ref 4.0–10.5)
WBC: 24.6 10*3/uL — ABNORMAL HIGH (ref 4.0–10.5)
nRBC: 2 /100 WBC — ABNORMAL HIGH
nRBC: 0.1 % (ref 0.0–0.2)

## 2018-01-31 LAB — COMPREHENSIVE METABOLIC PANEL
ALT: 52 U/L — AB (ref 0–44)
ALT: 214 U/L — ABNORMAL HIGH (ref 0–44)
AST: 81 U/L — ABNORMAL HIGH (ref 15–41)
AST: 232 U/L — ABNORMAL HIGH (ref 15–41)
Albumin: 3.5 g/dL (ref 3.5–5.0)
Albumin: 3.4 g/dL — ABNORMAL LOW (ref 3.5–5.0)
Alkaline Phosphatase: 108 U/L (ref 38–126)
Alkaline Phosphatase: 125 U/L (ref 38–126)
Anion gap: 10 (ref 5–15)
Anion gap: 13 (ref 5–15)
BUN: 15 mg/dL (ref 8–23)
BUN: 19 mg/dL (ref 8–23)
CO2: 20 mmol/L — ABNORMAL LOW (ref 22–32)
CO2: 18 mmol/L — ABNORMAL LOW (ref 22–32)
CREATININE: 1.01 mg/dL — AB (ref 0.44–1.00)
Calcium: 8.6 mg/dL — ABNORMAL LOW (ref 8.9–10.3)
Calcium: 8.5 mg/dL — ABNORMAL LOW (ref 8.9–10.3)
Chloride: 106 mmol/L (ref 98–111)
Chloride: 107 mmol/L (ref 98–111)
Creatinine, Ser: 0.85 mg/dL (ref 0.44–1.00)
GFR calc Af Amer: >60 mL/min (ref >60)
GFR calc non Af Amer: >60 mL/min (ref >60)
GFR, EST AFRICAN AMERICAN: 58 mL/min — AB (ref >60)
GFR, EST NON AFRICAN AMERICAN: 50 mL/min — AB (ref >60)
Glucose, Bld: 330 mg/dL — ABNORMAL HIGH (ref 70–99)
Glucose, Bld: 174 mg/dL — ABNORMAL HIGH (ref 70–99)
Potassium: 4.2 mmol/L (ref 3.5–5.1)
Potassium: 5 mmol/L (ref 3.5–5.1)
Sodium: 136 mmol/L (ref 135–145)
Sodium: 138 mmol/L (ref 135–145)
Total Bilirubin: 0.4 mg/dL (ref 0.3–1.2)
Total Bilirubin: 0.6 mg/dL (ref 0.3–1.2)
Total Protein: 6.1 g/dL — ABNORMAL LOW (ref 6.5–8.1)
Total Protein: 6.2 g/dL — ABNORMAL LOW (ref 6.5–8.1)

## 2018-01-31 LAB — MAGNESIUM: Magnesium: 1.8 mg/dL (ref 1.7–2.4)

## 2018-01-31 LAB — LACTIC ACID, PLASMA
Lactic Acid, Venous: 3.1 mmol/L (ref 0.5–1.9)
Lactic Acid, Venous: 3.7 mmol/L (ref 0.5–1.9)
Lactic Acid, Venous: 3.3 mmol/L (ref 0.5–1.9)

## 2018-01-31 LAB — I-STAT ARTERIAL BLOOD GAS, ED
Acid-base deficit: 8 mmol/L — ABNORMAL HIGH (ref 0.0–2.0)
BICARBONATE: 19.7 mmol/L — AB (ref 20.0–28.0)
O2 Saturation: 90 %
PO2 ART: 76 mmHg — AB (ref 83.0–108.0)
TCO2: 21 mmol/L — AB (ref 22–32)
pCO2 arterial: 53.8 mmHg — ABNORMAL HIGH (ref 32.0–48.0)
pH, Arterial: 7.174 — CL (ref 7.350–7.450)

## 2018-01-31 LAB — PHOSPHORUS: Phosphorus: 4.9 mg/dL — ABNORMAL HIGH (ref 2.5–4.6)

## 2018-01-31 LAB — PROTIME-INR
INR: 1.26
Prothrombin Time: 15.7 seconds — ABNORMAL HIGH (ref 11.4–15.2)

## 2018-01-31 LAB — URINALYSIS, ROUTINE W REFLEX MICROSCOPIC
Bacteria, UA: NONE SEEN
Bilirubin Urine: NEGATIVE
GLUCOSE, UA: 150 mg/dL — AB
HGB URINE DIPSTICK: NEGATIVE
KETONES UR: NEGATIVE mg/dL
LEUKOCYTES UA: NEGATIVE
Nitrite: NEGATIVE
PH: 6 (ref 5.0–8.0)
PROTEIN: 30 mg/dL — AB
Specific Gravity, Urine: 1.018 (ref 1.005–1.030)

## 2018-01-31 LAB — I-STAT CG4 LACTIC ACID, ED: LACTIC ACID, VENOUS: 3.85 mmol/L — AB (ref 0.5–1.9)

## 2018-01-31 LAB — APTT: aPTT: 31 seconds (ref 24–36)

## 2018-01-31 LAB — BRAIN NATRIURETIC PEPTIDE: B Natriuretic Peptide: 1377.6 pg/mL — ABNORMAL HIGH (ref 0.0–100.0)

## 2018-01-31 LAB — I-STAT TROPONIN, ED: Troponin i, poc: 0.02 ng/mL (ref 0.00–0.08)

## 2018-01-31 LAB — GLUCOSE, CAPILLARY
Glucose-Capillary: 143 mg/dL — ABNORMAL HIGH (ref 70–99)
Glucose-Capillary: 152 mg/dL — ABNORMAL HIGH (ref 70–99)

## 2018-01-31 LAB — MRSA PCR SCREENING: MRSA by PCR: NEGATIVE

## 2018-01-31 LAB — PROCALCITONIN: Procalcitonin: <0.1 ng/mL

## 2018-01-31 MED ORDER — METHYLPREDNISOLONE SODIUM SUCC 125 MG IJ SOLR
125.0000 mg | Freq: Once | INTRAMUSCULAR | Status: AC
  Administered 2018-01-31: 125 mg via INTRAVENOUS
  Filled 2018-01-31: qty 2

## 2018-01-31 MED ORDER — METHYLPREDNISOLONE SODIUM SUCC 40 MG IJ SOLR
40.0000 mg | Freq: Four times a day (QID) | INTRAMUSCULAR | Status: DC
  Administered 2018-01-31 – 2018-02-01 (×4): 40 mg via INTRAVENOUS
  Filled 2018-01-31 (×4): qty 1

## 2018-01-31 MED ORDER — LORAZEPAM 2 MG/ML IJ SOLN
0.5000 mg | Freq: Once | INTRAMUSCULAR | Status: AC
  Administered 2018-01-31: 0.5 mg via INTRAVENOUS

## 2018-01-31 MED ORDER — ONDANSETRON HCL 4 MG/2ML IJ SOLN
4.0000 mg | Freq: Once | INTRAMUSCULAR | Status: AC
  Administered 2018-01-31: 4 mg via INTRAVENOUS

## 2018-01-31 MED ORDER — VANCOMYCIN HCL IN DEXTROSE 750-5 MG/150ML-% IV SOLN
750.0000 mg | INTRAVENOUS | Status: DC
  Filled 2018-01-31: qty 150

## 2018-01-31 MED ORDER — SODIUM CHLORIDE 0.9 % IV SOLN
1.0000 g | Freq: Three times a day (TID) | INTRAVENOUS | Status: DC
  Administered 2018-01-31: 1 g via INTRAVENOUS
  Filled 2018-01-31 (×3): qty 1

## 2018-01-31 MED ORDER — VANCOMYCIN HCL IN DEXTROSE 1-5 GM/200ML-% IV SOLN
1000.0000 mg | Freq: Once | INTRAVENOUS | Status: AC
  Administered 2018-01-31: 1000 mg via INTRAVENOUS
  Filled 2018-01-31: qty 200

## 2018-01-31 MED ORDER — ENSURE ENLIVE PO LIQD
237.0000 mL | Freq: Two times a day (BID) | ORAL | Status: DC
  Administered 2018-02-02 – 2018-02-05 (×7): 237 mL via ORAL

## 2018-01-31 MED ORDER — LORAZEPAM 2 MG/ML IJ SOLN
0.5000 mg | Freq: Once | INTRAMUSCULAR | Status: AC
  Administered 2018-01-31: 0.5 mg via INTRAVENOUS
  Filled 2018-01-31: qty 1

## 2018-01-31 MED ORDER — SODIUM CHLORIDE 0.9 % IV BOLUS (SEPSIS)
500.0000 mL | Freq: Once | INTRAVENOUS | Status: AC
  Administered 2018-01-31: 500 mL via INTRAVENOUS

## 2018-01-31 MED ORDER — SODIUM CHLORIDE 0.9 % IV SOLN
2.0000 g | Freq: Once | INTRAVENOUS | Status: AC
  Administered 2018-01-31: 2 g via INTRAVENOUS
  Filled 2018-01-31: qty 2

## 2018-01-31 MED ORDER — SODIUM CHLORIDE 0.9 % IV BOLUS (SEPSIS)
1000.0000 mL | Freq: Once | INTRAVENOUS | Status: AC
  Administered 2018-01-31: 1000 mL via INTRAVENOUS

## 2018-01-31 MED ORDER — SODIUM CHLORIDE 0.9 % IV SOLN
INTRAVENOUS | Status: DC | PRN
  Administered 2018-01-31: 250 mL via INTRAVENOUS
  Administered 2018-02-03: 200 mL via INTRAVENOUS
  Administered 2018-02-04: 140 mL via INTRAVENOUS

## 2018-01-31 MED ORDER — RIVAROXABAN 15 MG PO TABS
15.0000 mg | ORAL_TABLET | Freq: Every day | ORAL | Status: DC
  Administered 2018-02-01 – 2018-02-04 (×4): 15 mg via ORAL
  Filled 2018-01-31 (×4): qty 1

## 2018-01-31 MED ORDER — LORAZEPAM 0.5 MG PO TABS
0.5000 mg | ORAL_TABLET | Freq: Three times a day (TID) | ORAL | Status: DC
  Administered 2018-02-01 (×2): 0.5 mg via ORAL
  Filled 2018-01-31 (×2): qty 1

## 2018-01-31 MED ORDER — DILTIAZEM HCL-DEXTROSE 100-5 MG/100ML-% IV SOLN (PREMIX)
5.0000 mg/h | INTRAVENOUS | Status: DC
  Administered 2018-01-31: 5 mg/h via INTRAVENOUS
  Administered 2018-02-01: 15 mg/h via INTRAVENOUS
  Filled 2018-01-31 (×2): qty 100

## 2018-01-31 MED ORDER — IPRATROPIUM-ALBUTEROL 0.5-2.5 (3) MG/3ML IN SOLN
3.0000 mL | Freq: Four times a day (QID) | RESPIRATORY_TRACT | Status: DC
  Administered 2018-01-31 – 2018-02-01 (×3): 3 mL via RESPIRATORY_TRACT
  Filled 2018-01-31 (×4): qty 3

## 2018-01-31 MED ORDER — INSULIN ASPART 100 UNIT/ML ~~LOC~~ SOLN
0.0000 [IU] | Freq: Three times a day (TID) | SUBCUTANEOUS | Status: DC
  Administered 2018-01-31: 1 [IU] via SUBCUTANEOUS
  Administered 2018-02-01 – 2018-02-02 (×4): 2 [IU] via SUBCUTANEOUS
  Administered 2018-02-02: 1 [IU] via SUBCUTANEOUS
  Administered 2018-02-02: 2 [IU] via SUBCUTANEOUS
  Administered 2018-02-03: 3 [IU] via SUBCUTANEOUS
  Administered 2018-02-03: 5 [IU] via SUBCUTANEOUS
  Administered 2018-02-03: 1 [IU] via SUBCUTANEOUS
  Administered 2018-02-04 – 2018-02-05 (×3): 5 [IU] via SUBCUTANEOUS

## 2018-01-31 MED ORDER — ONDANSETRON HCL 4 MG/2ML IJ SOLN
INTRAMUSCULAR | Status: AC
  Filled 2018-01-31: qty 2

## 2018-01-31 MED ORDER — BUDESONIDE 0.25 MG/2ML IN SUSP
0.2500 mg | Freq: Two times a day (BID) | RESPIRATORY_TRACT | Status: DC
  Administered 2018-01-31 – 2018-02-05 (×10): 0.25 mg via RESPIRATORY_TRACT
  Filled 2018-01-31 (×10): qty 2

## 2018-01-31 MED ORDER — IPRATROPIUM-ALBUTEROL 0.5-2.5 (3) MG/3ML IN SOLN: 3.0000 mL | Freq: Four times a day (QID) | RESPIRATORY_TRACT | Status: DC

## 2018-01-31 MED ORDER — LORAZEPAM 2 MG/ML IJ SOLN
INTRAMUSCULAR | Status: AC
  Filled 2018-01-31: qty 1

## 2018-01-31 MED ORDER — METOPROLOL TARTRATE 12.5 MG HALF TABLET
12.5000 mg | ORAL_TABLET | Freq: Two times a day (BID) | ORAL | Status: DC
  Filled 2018-01-31: qty 1

## 2018-01-31 MED ORDER — SODIUM CHLORIDE 0.9 % IV SOLN
2.0000 g | INTRAVENOUS | Status: DC
  Administered 2018-01-31 – 2018-02-04 (×5): 2 g via INTRAVENOUS
  Filled 2018-01-31 (×6): qty 20

## 2018-01-31 MED ORDER — METRONIDAZOLE IN NACL 5-0.79 MG/ML-% IV SOLN
500.0000 mg | Freq: Three times a day (TID) | INTRAVENOUS | Status: DC
  Administered 2018-01-31 (×3): 500 mg via INTRAVENOUS
  Filled 2018-01-31 (×3): qty 100

## 2018-01-31 NOTE — Progress Notes (Signed)
Patient heart rate is in the 130's.  Cardizem is currently infusing at 5mg /hr. Unable to titrate due to ordered dose being 5mg /hr.  Jeneen Rinks from pharmacy stated the order dose needs to be changed to 5-15mg /hr.  Notified Jeannette Corpus, NP

## 2018-01-31 NOTE — ED Provider Notes (Signed)
[facility] Provider Note   CSN: [id number] Arrival date & time: 01/31/18  0541     History   Chief Complaint Chief Complaint  Patient presents with  . Shortness of Breath    HPI Angela Hines is a 84 y.o. female.  She presents to the emergency department for difficulty breathing.  Patient reports that she has been sick for several days.  Overnight her breathing significantly worsened.  She called EMS.  First responders found her to have significant wheezing throughout all lung fields, initiated bronchodilator therapy.  EMS reports that she has been a very dyspneic, hypertensive, diaphoretic during transport.     Past Medical History:  Diagnosis Date  . Anxiety   . Aortic stenosis   . Atrial fibrillation ([place])   . CHF (congestive heart failure) ([place])   . CHF (congestive heart failure) ([place])   . COPD (chronic obstructive pulmonary disease) ([place])   . COPD (chronic obstructive pulmonary disease) ([place])   . Depressed affect   . Diabetes mellitus without complication ([place])   . High cholesterol   . Hypertension   . Paroxysmal atrial fibrillation [facility])     Patient Active Problem List   Diagnosis Date Noted  . Diabetes mellitus without complication ([place]) 09/32/6712  . Depression with anxiety 12/17/2017  . UTI (urinary tract infection) 12/17/2017  . Hypokalemia 12/17/2017  . Intussusception ([place])   . Falls frequently 12/14/2017  . Paracetamol (acetaminophen) overdose, accidental or unintentional, initial encounter 11/21/2017  . Nausea & vomiting 09/10/2017  . Chest pain 09/10/2017  . High anion gap metabolic acidosis 45/80/9983  . Chronic systolic CHF (congestive heart failure) ([place]) 09/10/2017  . Protein-calorie malnutrition, severe 06/11/2017  . Prolonged QT interval   . COPD (chronic obstructive pulmonary disease) ([place]) 07/17/2016  . Diabetes mellitus ([place]) 07/17/2016  . Gastroesophageal reflux disease without esophagitis  07/17/2016  . Generalized anxiety disorder 07/17/2016  . Hypercholesterolemia 07/17/2016  . Hypertension 07/17/2016  . Status post placement of implantable loop recorder 07/17/2016  . AF (paroxysmal atrial fibrillation) ([place]) 07/17/2016  . Primary insomnia 07/17/2016  . S/P AVR (aortic valve replacement) 07/17/2016  . History of syncope 08/07/2014    Past Surgical History:  Procedure Laterality Date  . CARDIAC PACEMAKER PLACEMENT  07/30/2014  . 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 CARDIOVERSION N/A 06/15/2017   Procedure: CARDIOVERSION;  Surgeon: Lelon Perla, MD;  Location: [facility] ENDOSCOPY;  Service: Cardiovascular;  Laterality: N/A;  . CHOLECYSTECTOMY    . EXCISIONAL HEMORRHOIDECTOMY    . LOOP RECORDER INSERTION N/A 08/22/2017   Procedure: LOOP RECORDER INSERTION;  Surgeon: [name], MD;  Location: [facility] CV LAB;  Service: Cardiovascular;  Laterality: N/A;  . LOOP RECORDER REMOVAL N/A 08/22/2017   Procedure: LOOP RECORDER REMOVAL;  Surgeon: [name], MD;  Location: [place] CV LAB;  Service: Cardiovascular;  Laterality: N/A;     OB History   None      Home Medications    Prior to Admission medications   Medication Sig Start Date End Date Taking? Authorizing Provider  albuterol (PROVENTIL HFA;VENTOLIN HFA) 108 (90 Base) MCG/ACT inhaler Inhale 2 puffs into the lungs every 6 (six) hours as needed for shortness of breath.    [provider]  amiodarone (PACERONE) 100 MG tablet Take 100 mg by mouth daily.    [provider]  bismuth subsalicylate (PEPTO BISMOL) 262 MG/15ML suspension Take 30 mLs by mouth every 6 (six)  hours as needed for indigestion.    [provider]  buPROPion (WELLBUTRIN XL) 300 MG 24 hr tablet Take 300 mg by mouth daily.  06/20/17   [provider]  feeding supplement, ENSURE ENLIVE, (ENSURE ENLIVE) LIQD Take 237 mLs by mouth 3 (three) times daily between meals. Patient taking differently:  Take 237 mLs by mouth daily.  06/12/17   Lavina Hamman, MD  furosemide (LASIX) 20 MG tablet Take 1 tablet (20 mg total) by mouth daily. 10/23/17   Daune Perch, NP  gabapentin (NEURONTIN) 300 MG capsule Take 600 mg by mouth at bedtime.  07/17/16   [provider]  ibuprofen (ADVIL,MOTRIN) 200 MG tablet Take 400 mg by mouth daily as needed for headache.    [provider]  LORazepam (ATIVAN) 0.5 MG tablet Take 1 tablet (0.5 mg total) by mouth 3 (three) times daily as needed for anxiety. Patient taking differently: Take 0.5 mg by mouth 3 (three) times daily. 1 tablet in the morning, 1 tablet in the evening and 2 tablets every night at bedtime 11/14/17   Charlann Lange, PA-C  magnesium oxide (MAG-OX) 400 (241.3 Mg) MG tablet Take 1 tablet (400 mg total) by mouth daily. 05/24/17   Nita Sells, MD  metFORMIN (GLUCOPHAGE) 1000 MG tablet Take 1,000 mg by mouth daily. 12/01/16   [provider]  metoprolol tartrate (LOPRESSOR) 25 MG tablet Take 12.5 mg by mouth 2 (two) times daily.    [provider]  omeprazole (PRILOSEC) 40 MG capsule Take 40 mg by mouth daily.     [provider]  polyethylene glycol (MIRALAX / GLYCOLAX) packet Take 17 g by mouth daily as needed for mild constipation. 06/12/17   Lavina Hamman, MD  polyethylene glycol [facility] / Floria Raveling) packet Take 17 g by mouth daily. 12/20/17   Donne Hazel, MD  Rivaroxaban (XARELTO) 15 MG TABS tablet Take 15 mg by mouth daily with supper. 07/17/16   [provider]    Family History Family History  Problem Relation Age of Onset  . Emphysema Father   . Hypertension Sister   . Lung disease Sister   . Hypertension Brother   . Hypertension Brother   . Stroke Brother   . Other Son        one spleen removed, one with chronic pain from MVA  . Diabetes Son   . Multiple myeloma Son   . Heart disease Son   . Heart attack Son     Social History Social History   Tobacco Use  .  Smoking status: Never Smoker  . Smokeless tobacco: Never Used  Substance Use Topics  . Alcohol use: No  . Drug use: No     Allergies   Clonidine; Captopril; Codeine; Ampicillin; and Ceclor [cefaclor]   Review of Systems Review of Systems  Unable to perform ROS: Acuity of condition     Physical Exam Updated Vital Signs BP (!) 180/111   Pulse (!) 112   Temp 99 F (37.2 C) (Rectal)   Resp (!) 27   Ht 5' 7"  (1.702 m)   Wt 49 kg   SpO2 100%   BMI 16.92 kg/m   Physical Exam  Constitutional: She is oriented to person, place, and time. She appears well-developed and well-nourished. No distress.  HENT:  Head: Normocephalic and atraumatic.  Right Ear: Hearing normal.  Left Ear: Hearing normal.  Nose: Nose normal.  Mouth/Throat: Oropharynx is clear and moist and mucous membranes are normal.  Eyes: Pupils are equal, round, and reactive to light. Conjunctivae and EOM are normal.  Neck: Normal range of motion. Neck supple.  Cardiovascular: Regular rhythm, S1 normal and S2 normal. Exam reveals no gallop and no friction rub.  No murmur heard. Pulmonary/Chest: Accessory muscle usage present. Tachypnea noted. She is in respiratory distress. She has wheezes. She exhibits no tenderness.  Abdominal: Soft. Normal appearance and bowel sounds are normal. There is no hepatosplenomegaly. There is no tenderness. There is no rebound, no guarding, no tenderness at McBurney's point and negative Murphy's sign. No hernia.  Musculoskeletal: Normal range of motion.  Neurological: She is alert and oriented to person, place, and time. She has normal strength. No cranial nerve deficit or sensory deficit. Coordination normal. GCS eye subscore is 4. GCS verbal subscore is 5. GCS motor subscore is 6.  Skin: Skin is warm, dry and intact. No rash noted. No cyanosis.  Psychiatric: She has a normal mood and affect. Her speech is normal and behavior is normal. Thought content normal.  Nursing note and vitals  reviewed.    ED Treatments / Results  Labs (all labs ordered are listed, but only abnormal results are displayed) Labs Reviewed  CBC WITH DIFFERENTIAL/PLATELET - Abnormal; Notable for the following components:      Result Value   WBC 18.1 (*)    RBC 3.39 (*)    Hemoglobin 7.8 (*)    HCT 28.7 (*)    MCH 23.0 (*)    MCHC 27.2 (*)    RDW 17.3 (*)    Platelets 455 (*)    Neutro Abs 11.2 (*)    Lymphs Abs 5.6 (*)    Basophils Absolute 0.2 (*)    nRBC 2 (*)    All other components within normal limits  COMPREHENSIVE METABOLIC PANEL - Abnormal; Notable for the following components:   CO2 20 (*)    Glucose, Bld 330 (*)    Creatinine, Ser 1.01 (*)    Calcium 8.6 (*)    Total Protein 6.1 (*)    AST 81 (*)    ALT 52 (*)    GFR calc non Af Amer 50 (*)    GFR calc Af Amer 58 (*)    All other components within normal limits  BRAIN NATRIURETIC PEPTIDE - Abnormal; Notable for the following components:   B Natriuretic Peptide 1,377.6 (*)    All other components within normal limits  I-STAT CG4 LACTIC ACID, ED - Abnormal; Notable for the following components:   Lactic Acid, Venous 3.85 (*)    All other components within normal limits  CULTURE, BLOOD (ROUTINE X 2)  CULTURE, BLOOD (ROUTINE X 2)  URINE CULTURE  URINALYSIS, ROUTINE W REFLEX MICROSCOPIC  I-STAT TROPONIN, ED  I-STAT ARTERIAL BLOOD GAS, ED    EKG EKG Interpretation  Date/Time:  Thursday January 31 2018 05:45:22 EDT Ventricular Rate:  110 PR Interval:    QRS Duration: 125 QT Interval:  353 QTC Calculation: 478 R Axis:   -51 Text Interpretation:  Atrial fibrillation Ventricular premature complex lvh with IVCD, LAD  and secondary repolarization abnormality No significant change since last tracing Confirmed by Orpah Greek ([id number]) on 01/31/2018 7:38:20 AM   Radiology Dg Chest Port 1 View  Result Date: 01/31/2018 CLINICAL DATA:  Shortness of breath tonight. EXAM: PORTABLE CHEST 1 VIEW COMPARISON:   12/17/2017 FINDINGS: Postoperative changes in the mediastinum. Cardiac valve prosthesis. Mild cardiac enlargement. No pulmonary vascular congestion. Emphysematous changes are suggested in the lungs with  perihilar streaky opacities and interstitial infiltrates in the lung bases suggesting chronic bronchitis and fibrosis. No focal consolidation. No blunting of costophrenic angles. No pneumothorax. Mediastinal contours appear intact. IMPRESSION: Emphysematous and chronic bronchitic changes in the lungs. No focal consolidation. Electronically Signed   By: Lucienne Capers M.D.   On: 01/31/2018 06:19    Procedures Procedures (including critical care time)  Medications Ordered in ED Medications  aztreonam (AZACTAM) 2 g in sodium chloride 0.9 % 100 mL IVPB (2 g Intravenous New Bag/Given 01/31/18 0726)  metroNIDAZOLE (FLAGYL) IVPB 500 mg (500 mg Intravenous New Bag/Given 01/31/18 0746)  vancomycin (VANCOCIN) IVPB 1000 mg/200 mL premix (1,000 mg Intravenous New Bag/Given 01/31/18 0727)  LORazepam (ATIVAN) 2 MG/ML injection (has no administration in time range)  ondansetron (ZOFRAN) 4 MG/2ML injection (has no administration in time range)  methylPREDNISolone sodium succinate (SOLU-MEDROL) 125 mg/2 mL injection 125 mg (has no administration in time range)  sodium chloride 0.9 % bolus 1,000 mL (0 mLs Intravenous Stopped 01/31/18 0729)    And  sodium chloride 0.9 % bolus 500 mL (500 mLs Intravenous New Bag/Given 01/31/18 0726)  ondansetron (ZOFRAN) injection 4 mg (4 mg Intravenous Given 01/31/18 0746)  LORazepam (ATIVAN) injection 0.5 mg (0.5 mg Intravenous Given 01/31/18 0745)     Initial Impression / Assessment and Plan / ED Course  I have reviewed the triage vital signs and the nursing notes.  Pertinent labs & imaging results that were available during my care of the patient were reviewed by me and considered in my medical decision making (see chart for details).    She presents to the emergency  department for evaluation of difficulty breathing.  She reports that she has been sick for several days with cough, chest congestion.  Overnight her breathing worsened.  Patient does have a history of CHF as well as COPD.  She is brought to the ER by ambulance with severe wheezing and bronchospasm.  She was treated with bronchodilator therapy during transport.  She continues to have shortness of breath here in the ER.  She is in a mild to moderate respiratory distress.  She was initiated on BiPAP.  Chest x-ray does not show evidence of obvious pneumonia.  Does have a significant leukocytosis, tachycardia, tachypnea, hypoxia.  She was initiated on empiric treatment for sepsis broad-spectrum antibiotic coverage.  We will add Solu-Medrol because of her history of COPD.  CRITICAL CARE Performed by: Orpah Greek   Total critical care time: 30 minutes  Critical care time was exclusive of separately billable procedures and treating other patients.  Critical care was necessary to treat or prevent imminent or life-threatening deterioration.  Critical care was time spent personally by me on the following activities: development of treatment plan with patient and/or surrogate as well as nursing, discussions with consultants, evaluation of patient's response to treatment, examination of patient, obtaining history from patient or surrogate, ordering and performing treatments and interventions, ordering and review of laboratory studies, ordering and review of radiographic studies, pulse oximetry and re-evaluation of patient's condition.    Final Clinical Impressions(s) / ED Diagnoses   Final diagnoses:  Sepsis with acute respiratory failure without septic shock, due to unspecified organism, unspecified whether hypoxia or hypercapnia present [facility])    ED Discharge Orders    None       Orpah Greek, MD 01/31/18 [phone]

## 2018-01-31 NOTE — Progress Notes (Signed)
PHARMACY - PHYSICIAN COMMUNICATION CRITICAL VALUE ALERT - BLOOD CULTURE IDENTIFICATION (BCID)  Angela Hines is an 84 y.o. female who presented to [facility] on 01/31/2018 with a chief complaint of SOB and wheezing  Assessment:  Pt presented with elevated LA and leukocytosis. Aztreonam/flagyl/vanc was started for sepsis. BCID came with with strep species tonight not enterococus, group A/B strep. It could be group C or G strep. These are very sens to ceftriaxone. D/w with NP on call to de-escalate to cetriaxone.   Name of physician (or Provider) Contacted: Lovey Newcomer   Current antibiotics: Vanc/aztreonam/flagyl  Changes to prescribed antibiotics recommended:  Ceftriaxone 2g IV q24  Results for orders placed or performed during the hospital encounter of 01/31/18  Blood Culture ID Panel (Reflexed) (Collected: 01/31/2018  6:54 AM)  Result Value Ref Range   Enterococcus species NOT DETECTED NOT DETECTED   Listeria monocytogenes NOT DETECTED NOT DETECTED   Staphylococcus species NOT DETECTED NOT DETECTED   Staphylococcus aureus (BCID) NOT DETECTED NOT DETECTED   Streptococcus species DETECTED (A) NOT DETECTED   Streptococcus agalactiae NOT DETECTED NOT DETECTED   Streptococcus pneumoniae NOT DETECTED NOT DETECTED   Streptococcus pyogenes NOT DETECTED NOT DETECTED   Acinetobacter baumannii NOT DETECTED NOT DETECTED   Enterobacteriaceae species NOT DETECTED NOT DETECTED   Enterobacter cloacae complex NOT DETECTED NOT DETECTED   Escherichia coli NOT DETECTED NOT DETECTED   Klebsiella oxytoca NOT DETECTED NOT DETECTED   Klebsiella pneumoniae NOT DETECTED NOT DETECTED   Proteus species NOT DETECTED NOT DETECTED   Serratia marcescens NOT DETECTED NOT DETECTED   Haemophilus influenzae NOT DETECTED NOT DETECTED   Neisseria meningitidis NOT DETECTED NOT DETECTED   Pseudomonas aeruginosa NOT DETECTED NOT DETECTED   Candida albicans NOT DETECTED NOT DETECTED   Candida glabrata NOT DETECTED  NOT DETECTED   Candida krusei NOT DETECTED NOT DETECTED   Candida parapsilosis NOT DETECTED NOT DETECTED   Candida tropicalis NOT DETECTED NOT DETECTED    Onnie Boer, PharmD, BCIDP, AAHIVP, CPP Infectious Disease Pharmacist Pager: [phone] 01/31/2018 10:51 PM

## 2018-01-31 NOTE — Progress Notes (Signed)
Pharmacy Antibiotic Note  Angela Hines is a 84 y.o. female admitted on 01/31/2018 with pneumonia.  Pharmacy has been consulted for vancomycin and aztreonam dosing. Pt is afebrile and WBC is elevated at 18.1. SCr is WNL and lactic acid is elevated at 3.1. First doses already given.   Plan: Vancomycin 750mg  IV Q24H Aztreonam 1gm IV Q8H F/u renal fxn, C&S, clinical status and trough at SS  Height: 5\' 7"  (170.2 cm) Weight: 108 lb 0.4 oz (49 kg) IBW/kg (Calculated) : 61.6  Temp (24hrs), Avg:98.8 F (37.1 C), Min:98.6 F (37 C), Max:99 F (37.2 C)  Recent Labs  Lab 01/31/18 0559 01/31/18 0606 01/31/18 0815  WBC 18.1*  --   --   CREATININE 1.01*  --   --   LATICACIDVEN  --  3.85* 3.1*    Estimated Creatinine Clearance: 32.6 mL/min (A) (by C-G formula based on SCr of 1.01 mg/dL (H)).    Allergies  Allergen Reactions  . Clonidine Swelling and Other (See Comments)    "head swelling"  . Captopril Swelling  . Codeine Nausea And Vomiting and Other (See Comments)    GI Intolerance   . Ampicillin Rash  . Ceclor [Cefaclor] Rash    Antimicrobials this admission: Vanc 10/10>> Aztreonam 10/10>> Flagyl 10/10>>  Dose adjustments this admission: N/A  Microbiology results: Pending  Thank you for allowing pharmacy to be a part of this patient's care.  Marysue Fait, Rande Lawman 01/31/2018 11:02 AM

## 2018-01-31 NOTE — ED Notes (Signed)
Respiratory at bedside- pt on nonrebreather

## 2018-01-31 NOTE — Progress Notes (Signed)
Patient is NPO and on Bipap.  Patient has scheduled Xarelto by mouth.  Notified Jeannette Corpus, NP

## 2018-01-31 NOTE — ED Notes (Signed)
RN discussed with MD results of lactic and need for palliative care consult.

## 2018-01-31 NOTE — ED Notes (Signed)
BIPAP off bc pt states she is nauseated. Respiratory at bedside to assist- sats dropping off bipap into 60's with good pleth and color is dusky. Non rebreather applied and zofran to be given.

## 2018-01-31 NOTE — Progress Notes (Signed)
CRITICAL VALUE ALERT  Critical Value:  Lactic Acid 3.3  Date & Time Notied:  01/31/18 9584  Provider Notified: X. Blount, NP  Orders Received/Actions taken: No response received

## 2018-01-31 NOTE — ED Notes (Signed)
Dr. Betsey Holiday and Katie(RN) made aware of pt's elevated lactic.

## 2018-01-31 NOTE — ED Notes (Signed)
Pt repositioned and mask readjusted and fan on.

## 2018-01-31 NOTE — Progress Notes (Signed)
Patient is NPO and has an order for xarelto, lopressor and ativan which are scheduled by mouth tonight.  Patient lactic acid is 3.3.  Jeannette Corpus, NP has been notified to please advise,.  Will continue to monitor the patient

## 2018-01-31 NOTE — Progress Notes (Signed)
Angela Hines [id number] Admission Data: 01/31/2018 2:00 PM Attending Provider: Bonnell Public, MD  [id number], Angela Sheffield, MD Consults/ Treatment Team:   [name] is a 84 y.o. female patient admitted from ED awake, alert  & orientated  X 3,  DNR, VSS - Blood pressure (!) 163/125, pulse (!) 108, temperature 98.6 F (37 C), temperature source Rectal, resp. rate (!) 26, height 5\' 7"  (1.702 m), weight 49 kg, SpO2 97 %., O2    BIPAP, no c/o chest pain, no distress noted. Tele # [id number] placed and pt is currently running:sinus tachycardia.   IV site WDL:  hand right, condition patent and no redness and left, condition patent and no redness and forearm right, condition patent and no redness with a transparent dsg that's clean dry and intact.  Allergies:   Allergies  Allergen Reactions  . Clonidine Swelling and Other (See Comments)    "head swelling"  . Captopril Swelling  . Codeine Nausea And Vomiting and Other (See Comments)    GI Intolerance   . Ampicillin Rash  . Ceclor [Cefaclor] Rash     Past Medical History:  Diagnosis Date  . Anxiety   . Aortic stenosis   . Atrial fibrillation ([place])   . CHF (congestive heart failure) ([place])   . CHF (congestive heart failure) ([place])   . COPD (chronic obstructive pulmonary disease) ([place])   . COPD (chronic obstructive pulmonary disease) ([place])   . Depressed affect   . Diabetes mellitus without complication ([place])   . High cholesterol   . Hypertension   . Paroxysmal atrial fibrillation ([facility])     Skin tear on R posterior leg.    Will cont to monitor and assist as needed.  Angela Hines Angela Sheffield, RN 01/31/2018 2:00 PM

## 2018-01-31 NOTE — Progress Notes (Signed)
Patient is very restless and continue to take off her bipap mask.  Patient has scheduled PO Ativan but patient is NPO.  Coralee North, NP to change order to  IV.  Will continue to monitor the patient as I wait on response.

## 2018-01-31 NOTE — ED Notes (Addendum)
Pt has wound on left calf- dressing removed- scant drainage noted but smells purulent. Redressed with xeroform and tegaderm at this time. Pt changed and purewick placed.

## 2018-01-31 NOTE — H&P (Signed)
History and Physical  Talasia Saulter KGU:[id number] DOB: [birth year] DOA: 01/31/2018  Referring physician: ER physician PCP: Kelton Pillar, MD  Outpatient Specialists:    Patient coming from: Patient lives in a retirement facility.  Chief Complaint: Shortness of breath and wheezing.  HPI: Patient is an 84 year old Caucasian female, with past medical history significant for COPD, CHF with an EF of 40 to 45%, paroxysmal atrial fibrillation, diabetes mellitus, hypertension, hyperlipidemia, aortic stenosis status post bioprosthetic aortic valve replacement that is currently moderately stenosed with a valve area of 0.31 cm as per echocardiogram done on 06/11/2017.  Patient cannot provide any history as patient is currently in respiratory distress and on BiPAP.  Patient was seen alongside patient's son and daughter-in-law.  According to the patient's son, the patient was doing quite well yesterday afternoon and participated in physical therapy.  During the night, patient developed shortness of breath, wheezing, and was said to be sweating.  Patient was found to be in atrial fibrillation around 4:00 in the morning.  On further questioning, patient's son reported that patient had nausea vomiting about a week ago.  However, no documented fever, no reported chills or coughing.  On presentation to the hospital, patient's cardiac BNP was elevated at 1377, lactic acid was elevated at 3.85, absolute neutrophil was elevated at 11.2 and the patient was tachycardic.  Patient is currently on BiPAP, and still has significant respiratory distress.  Hospitalist team has been asked to admit patient for further assessment and management.  ED Course: On presentation to the ER, sepsis protocol was activated.  Patient has been pancultured, started on IV antibiotics and has been volume resuscitated.  Patient is also on treatment for acute on chronic respiratory failure that is likely secondary to COPD exacerbation.  Patient is  currently on BiPAP. Pertinent labs: ABG revealed pH of 7.17, PCO2 of 53 and PO2 of 76.  Chemistry revealed sodium of 136, potassium of 4.2, chloride 106, CO2 20, BUN of 15, creatinine of 1.01 with blood sugar of 330.  Cardiac BNP was 1377.  Troponin was 0.02.  CBC revealed WBC of 18.1, hemoglobin of 7.8, hematocrit of 28.7, MCV of 84.7 with platelet count of 455.  Chest x-ray is said to reveal "Emphysematous and chronic bronchitic changes in the lungs. No focal consolidation". EKG: Independently reviewed.  Revealed A. fib, with heart rate of 110 on presentation, left bundle branch block and ventricular premature complexes. Imaging: independently reviewed.  Review of Systems:  Negative for fever, visual changes, sore throat, rash, new muscle aches, chest pain, dysuria, bleeding, n/v/abdominal pain.  Past Medical History:  Diagnosis Date  . Anxiety   . Aortic stenosis   . Atrial fibrillation ([place])   . CHF (congestive heart failure) ([place])   . CHF (congestive heart failure) ([place])   . COPD (chronic obstructive pulmonary disease) ([place])   . COPD (chronic obstructive pulmonary disease) ([place])   . Depressed affect   . Diabetes mellitus without complication ([place])   . High cholesterol   . Hypertension   . Paroxysmal atrial fibrillation [facility])     Past Surgical History:  Procedure Laterality Date  . CARDIAC PACEMAKER PLACEMENT  07/30/2014  . 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 CARDIOVERSION N/A 06/15/2017   Procedure: CARDIOVERSION;  Surgeon: Lelon Perla, MD;  Location: [facility] ENDOSCOPY;  Service: Cardiovascular;  Laterality: N/A;  . CHOLECYSTECTOMY    . EXCISIONAL HEMORRHOIDECTOMY    . LOOP RECORDER INSERTION N/A 08/22/2017  Procedure: LOOP RECORDER INSERTION;  Surgeon: [name], MD;  Location: [place] CV LAB;  Service: Cardiovascular;  Laterality: N/A;  . LOOP RECORDER REMOVAL N/A 08/22/2017   Procedure: LOOP RECORDER REMOVAL;  Surgeon: [name], MD;  Location: [place] CV LAB;  Service: Cardiovascular;  Laterality: N/A;     reports that she has never smoked. She has never used smokeless tobacco. She reports that she does not drink alcohol or use drugs.  Allergies  Allergen Reactions  . Clonidine Swelling and Other (See Comments)    "head swelling"  . Captopril Swelling  . Codeine Nausea And Vomiting and Other (See Comments)    GI Intolerance   . Ampicillin Rash  . Ceclor [Cefaclor] Rash    Family History  Problem Relation Age of Onset  . Emphysema Father   . Hypertension Sister   . Lung disease Sister   . Hypertension Brother   . Hypertension Brother   . Stroke Brother   . Other Son        one spleen removed, one with chronic pain from MVA  . Diabetes Son   . Multiple myeloma Son   . Heart disease Son   . Heart attack Son      Prior to Admission medications   Medication Sig Start Date End Date Taking? Authorizing Provider  albuterol (PROVENTIL HFA;VENTOLIN HFA) 108 (90 Base) MCG/ACT inhaler Inhale 2 puffs into the lungs every 6 (six) hours as needed for shortness of breath.   Yes [provider]  amiodarone (PACERONE) 100 MG tablet Take 100 mg by mouth daily.   Yes [provider]  buPROPion (WELLBUTRIN XL) 300 MG 24 hr tablet Take 300 mg by mouth daily.  06/20/17  Yes [provider]  feeding supplement, ENSURE ENLIVE, (ENSURE ENLIVE) LIQD Take 237 mLs by mouth 3 (three) times daily between meals. Patient taking differently: Take 237 mLs by mouth 2 (two) times daily between meals.  06/12/17  Yes Lavina Hamman, MD  furosemide (LASIX) 20 MG tablet Take 1 tablet (20 mg total) by mouth daily. 10/23/17  Yes Daune Perch, NP  gabapentin (NEURONTIN) 300 MG capsule Take 600 mg by mouth at bedtime.  07/17/16  Yes [provider]  ibuprofen (ADVIL,MOTRIN) 200 MG tablet Take 400 mg by mouth daily as needed for headache.   Yes [provider]  LORazepam (ATIVAN) 0.5 MG tablet Take 1 tablet (0.5  mg total) by mouth 3 (three) times daily as needed for anxiety. Patient taking differently: Take 0.5 mg by mouth 3 (three) times daily. 1 tablet in the morning, 1 tablet in the evening and 2 tablets every night at bedtime 11/14/17  Yes Upstill, Shari, PA-C  magnesium oxide (MAG-OX) 400 (241.3 Mg) MG tablet Take 1 tablet (400 mg total) by mouth daily. 05/24/17  Yes Nita Sells, MD  metoprolol tartrate (LOPRESSOR) 25 MG tablet Take 12.5 mg by mouth 2 (two) times daily.   Yes [provider]  omeprazole (PRILOSEC) 40 MG capsule Take 40 mg by mouth daily.    Yes [provider]  polyethylene glycol (MIRALAX / GLYCOLAX) packet Take 17 g by mouth daily as needed for mild constipation. 06/12/17  Yes Lavina Hamman, MD  Rivaroxaban (XARELTO) 15 MG TABS tablet Take 15 mg by mouth daily with supper. 07/17/16  Yes [provider]  metFORMIN (GLUCOPHAGE) 1000 MG tablet Take 1,000 mg by mouth daily. 12/01/16   [provider]  polyethylene glycol (MIRALAX / GLYCOLAX) packet  Take 17 g by mouth daily. Patient not taking: Reported on 01/31/2018 12/20/17   Donne Hazel, MD    Physical Exam: Vitals:   01/31/18 0845 01/31/18 0900 01/31/18 0915 01/31/18 0933  BP: (!) 142/106 (!) 172/96 (!) 133/91   Pulse: (!) 109 (!) 110 (!) 107   Resp: (!) 28 (!) 27 (!) 31   Temp:    98.6 F (37 C)  TempSrc:    Rectal  SpO2: 95% 93% 93%   Weight:      Height:         Constitutional:  . Cachectic, chronically ill looking on the acute respiratory distress.  Patient is currently on BiPAP. Eyes:  . Pallor.  No jaundice.  ENMT:  . external ears, nose appear normal Neck:  . Neck is supple.  JVD is difficult to assess.   Respiratory:  . Decreased air entry with expiratory wheeze.  . Increased respiratory effort. Cardiovascular:  . S1S2, irregularly irregular. Abdomen:  . Abdomen is soft and non tender. Organs are difficult to assess. Neurologic:  . Awake and  alert. . Moves all limbs.  Wt Readings from Last 3 Encounters:  01/31/18 49 kg  01/16/18 49 kg  12/17/17 48.3 kg    I have personally reviewed following labs and imaging studies  Labs on Admission:  CBC: Recent Labs  Lab 01/31/18 0559  WBC 18.1*  NEUTROABS 11.2*  HGB 7.8*  HCT 28.7*  MCV 84.7  PLT 353*   Basic Metabolic Panel: Recent Labs  Lab 01/31/18 0559  NA 136  K 4.2  CL 106  CO2 20*  GLUCOSE 330*  BUN 15  CREATININE 1.01*  CALCIUM 8.6*   Liver Function Tests: Recent Labs  Lab 01/31/18 0559  AST 81*  ALT 52*  ALKPHOS 108  BILITOT 0.4  PROT 6.1*  ALBUMIN 3.5   No results for input(s): LIPASE, AMYLASE in the last 168 hours. No results for input(s): AMMONIA in the last 168 hours. Coagulation Profile: No results for input(s): INR, PROTIME in the last 168 hours. Cardiac Enzymes: No results for input(s): CKTOTAL, CKMB, CKMBINDEX, TROPONINI in the last 168 hours. BNP (last 3 results) No results for input(s): PROBNP in the last 8760 hours. HbA1C: No results for input(s): HGBA1C in the last 72 hours. CBG: No results for input(s): GLUCAP in the last 168 hours. Lipid Profile: No results for input(s): CHOL, HDL, LDLCALC, TRIG, CHOLHDL, LDLDIRECT in the last 72 hours. Thyroid Function Tests: No results for input(s): TSH, T4TOTAL, FREET4, T3FREE, THYROIDAB in the last 72 hours. Anemia Panel: No results for input(s): VITAMINB12, FOLATE, FERRITIN, TIBC, IRON, RETICCTPCT in the last 72 hours. Urine analysis:    Component Value Date/Time   COLORURINE YELLOW 01/31/2018 0645   APPEARANCEUR CLEAR 01/31/2018 0645   LABSPEC 1.018 01/31/2018 0645   PHURINE 6.0 01/31/2018 0645   GLUCOSEU 150 (A) 01/31/2018 0645   HGBUR NEGATIVE 01/31/2018 0645   BILIRUBINUR NEGATIVE 01/31/2018 0645   KETONESUR NEGATIVE 01/31/2018 0645   PROTEINUR 30 (A) 01/31/2018 0645   NITRITE NEGATIVE 01/31/2018 0645   LEUKOCYTESUR NEGATIVE 01/31/2018 0645   Sepsis  Labs: _0 (procalcitonin:4,lacticidven:4) )No results found for this or any previous visit (from the past 240 hour(s)).    Radiological Exams on Admission: Dg Chest Port 1 View  Result Date: 01/31/2018 CLINICAL DATA:  Shortness of breath tonight. EXAM: PORTABLE CHEST 1 VIEW COMPARISON:  12/17/2017 FINDINGS: Postoperative changes in the mediastinum. Cardiac valve prosthesis. Mild cardiac enlargement. No pulmonary vascular congestion. Emphysematous changes  are suggested in the lungs with perihilar streaky opacities and interstitial infiltrates in the lung bases suggesting chronic bronchitis and fibrosis. No focal consolidation. No blunting of costophrenic angles. No pneumothorax. Mediastinal contours appear intact. IMPRESSION: Emphysematous and chronic bronchitic changes in the lungs. No focal consolidation. Electronically Signed   By: Lucienne Capers M.D.   On: 01/31/2018 06:19    EKG: Independently reviewed.   Active Problems:   * No active hospital problems. *   Assessment/Plan: Sepsis, possible respiratory source: Continue with the sepsis protocol Follow culture results.   IV antibiotics CT scan chest: Patient is more stable and adequately hydrated.  Acute on chronic respiratory failure: IV steroids Nebulizer treatment Continue antibiotics  COPD with exacerbation: See above.  Failure to thrive: Patient has been admitted to this hospital about 7 to 8 times this year.   History of recurrent falls. Guarded prognosis Palliative care consult.  Systolic congestive heart failure, exacerbation is equivocal: Patient is currently septic. Cautious use of fluids. Continue to monitor clinically. History of aortic valve stenosis status post bioprosthetic valve replacement noted  Moderate aortic stenosis of prosthetic valve: Continue to assess patient.  Have a low threshold to consult the cardiology team.  Atrial fibrillation with minimal rapid ventricular  response: Controlled heart rate. Continue to treat the pulmonary process. Further management depend on hospital course.  DVT prophylaxis: Patient is on Xarelto. Code Status: DO NOT RESUSCITATE. Family Communication: Son and daughter-in-law Disposition Plan: Will depend on hospital course Consults called: Palliative care Admission status: Inpatient  Patient is critically ill.  Patient is septic.  Patient also has respiratory failure as well as possible cardiac failure.  Patient needs to be managed closely on an inpatient basis.  Time spent: 75 minutes  Dana Allan, MD  [facility] Pager #: [phone] 7PM-7AM contact night coverage as above  01/31/2018, 10:16 AM

## 2018-01-31 NOTE — Progress Notes (Signed)
Patient transported to 5W18 from the ED without any apparent complications.

## 2018-01-31 NOTE — ED Notes (Signed)
Pt on BIPAP currently- complaining of being nauseated. Mask removed for brief period.

## 2018-01-31 NOTE — ED Triage Notes (Signed)
Patient BIB [facility] for SOB. Patient reports difficulty sleeping tonight due to cough and increased SOB. EMS gave 5 mg Atrovent, 125 solumedrol. Patient denies chest pain or abdominal pain. Patient's only complaint "I feel like I'm not getting enough air".    BP 220/109 HR 110 98% non-rebreath   CBG 318

## 2018-01-31 NOTE — ED Notes (Signed)
Helped get patient straighten up in the bed patient is resting with call bell in reach

## 2018-02-01 ENCOUNTER — Inpatient Hospital Stay (HOSPITAL_COMMUNITY): Payer: Medicare Other

## 2018-02-01 DIAGNOSIS — I5022 Chronic systolic (congestive) heart failure: Secondary | ICD-10-CM

## 2018-02-01 DIAGNOSIS — R0603 Acute respiratory distress: Secondary | ICD-10-CM

## 2018-02-01 DIAGNOSIS — Z515 Encounter for palliative care: Secondary | ICD-10-CM

## 2018-02-01 DIAGNOSIS — Z7189 Other specified counseling: Secondary | ICD-10-CM

## 2018-02-01 LAB — BASIC METABOLIC PANEL
ANION GAP: 10 (ref 5–15)
BUN: 18 mg/dL (ref 8–23)
CO2: 21 mmol/L — ABNORMAL LOW (ref 22–32)
Calcium: 8.7 mg/dL — ABNORMAL LOW (ref 8.9–10.3)
Chloride: 107 mmol/L (ref 98–111)
Creatinine, Ser: 0.64 mg/dL (ref 0.44–1.00)
GFR calc Af Amer: >60 mL/min (ref >60)
GLUCOSE: 182 mg/dL — AB (ref 70–99)
Potassium: 4 mmol/L (ref 3.5–5.1)
Sodium: 138 mmol/L (ref 135–145)

## 2018-02-01 LAB — CBC
HCT: 26.9 % — ABNORMAL LOW (ref 36.0–46.0)
Hemoglobin: 7.6 g/dL — ABNORMAL LOW (ref 12.0–15.0)
MCH: 23 pg — ABNORMAL LOW (ref 26.0–34.0)
MCHC: 28.3 g/dL — ABNORMAL LOW (ref 30.0–36.0)
MCV: 81.5 fL (ref 80.0–100.0)
Platelets: 352 10*3/uL (ref 150–400)
RBC: 3.3 MIL/uL — ABNORMAL LOW (ref 3.87–5.11)
RDW: 17 % — ABNORMAL HIGH (ref 11.5–15.5)
WBC: 18.9 10*3/uL — ABNORMAL HIGH (ref 4.0–10.5)
nRBC: 0.2 % (ref 0.0–0.2)

## 2018-02-01 LAB — GLUCOSE, CAPILLARY
Glucose-Capillary: 172 mg/dL — ABNORMAL HIGH (ref 70–99)
Glucose-Capillary: 171 mg/dL — ABNORMAL HIGH (ref 70–99)
Glucose-Capillary: 192 mg/dL — ABNORMAL HIGH (ref 70–99)
Glucose-Capillary: 133 mg/dL — ABNORMAL HIGH (ref 70–99)

## 2018-02-01 LAB — URINE CULTURE: CULTURE: NO GROWTH

## 2018-02-01 LAB — TROPONIN I: Troponin I: 0.12 ng/mL (ref <0.03)

## 2018-02-01 MED ORDER — LEVALBUTEROL HCL 0.63 MG/3ML IN NEBU
0.6300 mg | INHALATION_SOLUTION | Freq: Four times a day (QID) | RESPIRATORY_TRACT | Status: DC
  Administered 2018-02-01 (×3): 0.63 mg via RESPIRATORY_TRACT
  Filled 2018-02-01 (×3): qty 3

## 2018-02-01 MED ORDER — LORAZEPAM 2 MG/ML IJ SOLN: 0.5000 mg | INTRAMUSCULAR | Status: DC | PRN

## 2018-02-01 MED ORDER — METHYLPREDNISOLONE SODIUM SUCC 40 MG IJ SOLR
40.0000 mg | Freq: Three times a day (TID) | INTRAMUSCULAR | Status: DC
  Administered 2018-02-01 – 2018-02-03 (×6): 40 mg via INTRAVENOUS
  Filled 2018-02-01 (×6): qty 1

## 2018-02-01 MED ORDER — ADULT MULTIVITAMIN W/MINERALS CH
1.0000 | ORAL_TABLET | Freq: Every day | ORAL | Status: DC
  Administered 2018-02-02 – 2018-02-05 (×4): 1 via ORAL
  Filled 2018-02-01 (×4): qty 1

## 2018-02-01 MED ORDER — ALBUTEROL SULFATE (2.5 MG/3ML) 0.083% IN NEBU
2.5000 mg | INHALATION_SOLUTION | RESPIRATORY_TRACT | Status: DC | PRN
  Administered 2018-02-02: 2.5 mg via RESPIRATORY_TRACT
  Filled 2018-02-01: qty 3

## 2018-02-01 MED ORDER — METOPROLOL TARTRATE 25 MG PO TABS
25.0000 mg | ORAL_TABLET | Freq: Three times a day (TID) | ORAL | Status: DC
  Administered 2018-02-01 – 2018-02-02 (×4): 25 mg via ORAL
  Filled 2018-02-01 (×3): qty 1

## 2018-02-01 MED ORDER — METOPROLOL TARTRATE 5 MG/5ML IV SOLN
5.0000 mg | Freq: Four times a day (QID) | INTRAVENOUS | Status: DC | PRN
  Administered 2018-02-02: 5 mg via INTRAVENOUS
  Filled 2018-02-01: qty 5

## 2018-02-01 MED ORDER — LORAZEPAM 0.5 MG PO TABS
0.5000 mg | ORAL_TABLET | ORAL | Status: DC | PRN
  Administered 2018-02-03 – 2018-02-04 (×2): 0.5 mg via ORAL
  Filled 2018-02-01 (×2): qty 1

## 2018-02-01 MED ORDER — ALPRAZOLAM 0.25 MG PO TABS
0.2500 mg | ORAL_TABLET | Freq: Once | ORAL | Status: AC
  Administered 2018-02-01: 0.25 mg via ORAL
  Filled 2018-02-01: qty 1

## 2018-02-01 MED ORDER — FUROSEMIDE 10 MG/ML IJ SOLN
20.0000 mg | Freq: Once | INTRAMUSCULAR | Status: AC
  Administered 2018-02-01: 20 mg via INTRAVENOUS
  Filled 2018-02-01: qty 2

## 2018-02-01 MED ORDER — METOPROLOL TARTRATE 5 MG/5ML IV SOLN: 5.0000 mg | Freq: Four times a day (QID) | INTRAVENOUS | Status: DC

## 2018-02-01 MED ORDER — AMIODARONE HCL 200 MG PO TABS
100.0000 mg | ORAL_TABLET | Freq: Every day | ORAL | Status: DC
  Administered 2018-02-01 – 2018-02-05 (×5): 100 mg via ORAL
  Filled 2018-02-01 (×5): qty 1

## 2018-02-01 MED ORDER — MORPHINE SULFATE (PF) 2 MG/ML IV SOLN
1.0000 mg | INTRAVENOUS | Status: DC | PRN
  Administered 2018-02-01 – 2018-02-02 (×3): 2 mg via INTRAVENOUS
  Administered 2018-02-02: 1 mg via INTRAVENOUS
  Administered 2018-02-03 (×2): 2 mg via INTRAVENOUS
  Administered 2018-02-03: 1 mg via INTRAVENOUS
  Filled 2018-02-01 (×7): qty 1

## 2018-02-01 MED ORDER — IPRATROPIUM BROMIDE 0.02 % IN SOLN
0.5000 mg | Freq: Four times a day (QID) | RESPIRATORY_TRACT | Status: DC
  Administered 2018-02-01 (×3): 0.5 mg via RESPIRATORY_TRACT
  Filled 2018-02-01 (×3): qty 2.5

## 2018-02-01 NOTE — Evaluation (Addendum)
Clinical/Bedside Swallow Evaluation Patient Details  Name: Angela Hines MRN: [id number] Date of Birth: [birth year]  Today's Date: 02/01/2018 Time: SLP Start Time (ACUTE ONLY): 0947 SLP Stop Time (ACUTE ONLY): 1001 SLP Time Calculation (min) (ACUTE ONLY): 14 min  Past Medical History:  Past Medical History:  Diagnosis Date  . Anginal pain ([place]) ~ 2007   "mini stroke affected her right eye; made it droop; fully recovered" (01/31/2018)  . Anxiety   . Aortic stenosis   . Arthritis    "joints" (01/31/2018)  . Atrial fibrillation ([place])   . CHF (congestive heart failure) ([place])   . Chronic bronchitis ([place])   . COPD (chronic obstructive pulmonary disease) ([place])   . Depressed affect   . Depression   . GERD (gastroesophageal reflux disease)   . Headache    "monthly" (01/31/2018)  . High cholesterol   . Hypertension   . Migraine    "none since <1990" (01/31/2018)  . Paroxysmal atrial fibrillation ([facility])   . Pneumonia 2019  . Type II diabetes mellitus ([place])    "recently dc'd Metformin" (01/31/2018)   Past Surgical History:  Past Surgical History:  Procedure Laterality Date  . CARDIAC PACEMAKER PLACEMENT  07/30/2014  . 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 CARDIOVERSION N/A 06/15/2017   Procedure: CARDIOVERSION;  Surgeon: Lelon Perla, MD;  Location: [facility] ENDOSCOPY;  Service: Cardiovascular;  Laterality: N/A;  . CATARACT EXTRACTION, BILATERAL Bilateral   . EXCISIONAL HEMORRHOIDECTOMY    . LAPAROSCOPIC CHOLECYSTECTOMY    . LOOP RECORDER INSERTION N/A 08/22/2017   Procedure: LOOP RECORDER INSERTION;  Surgeon: [name], MD;  Location: [place] CV LAB;  Service: Cardiovascular;  Laterality: N/A;  . LOOP RECORDER REMOVAL N/A 08/22/2017   Procedure: LOOP RECORDER REMOVAL;  Surgeon: [name], MD;  Location: [place] CV LAB;  Service: Cardiovascular;  Laterality: N/A;   HPI:  Patient is an 84 year old Caucasian female, with past medical history  significant for COPD, presbyesophagus, CHF with an EF of 40 to 45%, paroxysmal atrial fibrillation, diabetes mellitus, hypertension, hyperlipidemia, aortic stenosis status post bioprosthetic aortic valve replacement. Admitted for respiratory distress and on BiPAP. Found to be in atrial fibrillation and tachycardic. Chest x-ray Focal opacities in the RIGHT UPPER lobe and bilateral LOWER lobes may indicate acute infectious process or asymmetric pulmonary edema. BSE 04/2017  symptoms appeared to be esophageal in nature.    Assessment / Plan / Recommendation Clinical Impression  Pt currently at high aspiration risk from respiratory standpoint with RR in low 30's, HR fluctuating 120's-130's and visible tachypnea. Discussed prior esophageal related dysphagia with pt and son. Several trial sips water and observed with pills whole in applesauce with subtle throat clear and audible swallow. Palliative care consult appropriate to assist guide further care in light of multiple admissions for respiratory distress and severity of chronic COPD. Recommend NPO except meds in applesauce (crush if large), oral hygiene. Will follow for outcome of Palliative care/pt's wishes to assist in treatment planning re: swallowing.     SLP Visit Diagnosis: Dysphagia, pharyngeal phase (R13.13)    Aspiration Risk  Moderate aspiration risk    Diet Recommendation NPO except meds   Medication Administration: Whole meds with puree    Other  Recommendations Oral Care Recommendations: Oral care QID   Follow up Recommendations Other (comment)(TBA)      Frequency and Duration min 2x/week  2 weeks       Prognosis Prognosis for Safe Diet Advancement:  Fair Barriers to Reach Goals: Other (Comment)(COPD, pervasive debility)      Swallow Study   General HPI: Patient is an 84 year old Caucasian female, with past medical history significant for COPD, presbyesophagus, CHF with an EF of 40 to 45%, paroxysmal atrial fibrillation,  diabetes mellitus, hypertension, hyperlipidemia, aortic stenosis status post bioprosthetic aortic valve replacement. Admitted for respiratory distress and on BiPAP. Found to be in atrial fibrillation and tachycardic. Chest x-ray Focal opacities in the RIGHT UPPER lobe and bilateral LOWER lobes may indicate acute infectious process or asymmetric pulmonary edema. BSE 04/2017  symptoms appeared to be esophageal in nature.  Type of Study: Bedside Swallow Evaluation Previous Swallow Assessment: (see HPI) Diet Prior to this Study: NPO Temperature Spikes Noted: Yes Respiratory Status: Nasal cannula History of Recent Intubation: No Behavior/Cognition: Lethargic/Drowsy;Requires cueing;Cooperative Oral Cavity Assessment: Dry Oral Care Completed by SLP: Yes Oral Cavity - Dentition: Adequate natural dentition Vision: Functional for self-feeding Self-Feeding Abilities: Needs assist(due to generalized weakness) Patient Positioning: Upright in bed Baseline Vocal Quality: Normal Volitional Cough: Weak    Oral/Motor/Sensory Function Overall Oral Motor/Sensory Function: Generalized oral weakness   Ice Chips Ice chips: Not tested   Thin Liquid Thin Liquid: Impaired Presentation: Cup;Straw Oral Phase Impairments: (none) Oral Phase Functional Implications: (none) Pharyngeal  Phase Impairments: Change in Vital Signs;Other (comments)(dyspnea)    Nectar Thick Nectar Thick Liquid: Not tested   Honey Thick Honey Thick Liquid: Not tested   Puree Puree: Impaired Oral Phase Impairments: (none) Oral Phase Functional Implications: (none) Pharyngeal Phase Impairments: Other (comments);Throat Clearing - Immediate(dyspnea)   Solid     Solid: Not tested      Houston Siren 02/01/2018,10:17 AM  Orbie Pyo Colvin Caroli.Ed Risk analyst [phone] Office [phone]

## 2018-02-01 NOTE — Progress Notes (Signed)
Initial Nutrition Assessment  DOCUMENTATION CODES:   Severe malnutrition in context of chronic illness, Underweight  INTERVENTION:   When diet advances, continue Ensure Enlive BID. Add MVI with minerals.   NUTRITION DIAGNOSIS:   Severe Malnutrition related to chronic illness as evidenced by NPO status, estimated needs, per patient/family report, moderate fat depletion, moderate muscle depletion.   GOAL:   Patient will meet greater than or equal to 90% of their needs   MONITOR:   Diet advancement, Skin, Weight trends  REASON FOR ASSESSMENT:   Other (Comment) low BMI, h/o malnutrition    ASSESSMENT:   84 yo female, admitted for respiratory distress. PMHx includes COPD, CHF (EF 40-45%), paroxysmal atrial fibrillation, DM, HTN, HLD, aortic stenosis s/p bioprosthetic aortic valve replacement. Sepsis protocol activated. On treatment for acute on chronic respiratory failure secondary to COPD exacerbation; on BiPAP.  Labs: glucose 182, troponin 1.2 (H), Hgb 7.6, Hct 26.9 Meds: ensure enlive BID, novolog,   Pt resting at time of visit. States UBW 125# (56.8 kg), but that it varies frequently. Last weighed this amount last week (?accuracy). Reports noticing her clothes fitting more loosely lately. Pt does not have much of an appetite today. Typically, pt eats 2 meals/day plus a small sandwich (PB&J or ham). Does not follow any special diet or take MVI. Denies current nausea or vomiting; describes recurring, unpredictable episodes of nausea that resolve on their own. Last BM 10/9 in the evening; pt states she also vomited at this time. Denies diarrhea or constipation, or difficulty chewing or swallowing. States she feels weak at times,  Making it hard to get around. Encouraged pt to eat protein foods with each meal and to drink Ensure shakes when diet advances. Pt amenable to Ensure Enlive in chocolate.   NUTRITION - FOCUSED PHYSICAL EXAM:    Most Recent Value  Orbital Region   Moderate depletion  Upper Arm Region  Moderate depletion  Thoracic and Lumbar Region  Moderate depletion  Buccal Region  Moderate depletion  Temple Region  Moderate depletion  Clavicle Bone Region  Moderate depletion  Clavicle and Acromion Bone Region  Moderate depletion  Scapular Bone Region  Moderate depletion  Dorsal Hand  Moderate depletion  Patellar Region  Moderate depletion  Anterior Thigh Region  Moderate depletion  Posterior Calf Region  Moderate depletion  Edema (RD Assessment)  None  Hair  Reviewed  Eyes  Reviewed  Mouth  Reviewed  Skin  Other (Comment) [ecchymosis]  Nails  Reviewed       Diet Order:   Diet Order            Diet NPO time specified Except for: Ice Chips, Sips with Meds  Diet effective now              EDUCATION NEEDS:   No education needs have been identified at this time  Skin:  Skin Assessment: Reviewed RN Assessment  Last BM:  no BM 10/9, per pt  Height:   Ht Readings from Last 1 Encounters:  01/31/18 5\' 7"  (1.702 m)    Weight:   Wt Readings from Last 1 Encounters:  01/31/18 49 kg    Ideal Body Weight:  61.6 kg  BMI:  Body mass index is 16.92 kg/m.  Estimated Nutritional Needs:   Kcal:  9485 kcal/day(MSJ x 1.4)  Protein:  74-92 g protein/day(1.2-1.5 g/kg IBW)  Fluid:  1 mL/kcal or per MD discretion  Althea Grimmer, MS, RDN, LDN On-call pager: [phone]

## 2018-02-01 NOTE — Progress Notes (Signed)
Entered room to speak with pt. Found pt's cardizem gtt turned off. Pt states she does not remember if anyone came in to see her about it. Paged Dr Sloan Leiter. MD stated to discontinue as pt's HR staying in low 100's to upper 90's. Pt states she feels improved.

## 2018-02-01 NOTE — Progress Notes (Signed)
Dr. Sloan Leiter stated that pt could be changed to telemetry instead of stepdown. Ranelle Oyster, RN

## 2018-02-01 NOTE — Progress Notes (Signed)
  Speech Language Pathology Treatment: Dysphagia  Patient Details Name: Angela Hines MRN: [id number] DOB: 07-18-1932 Today's Date: 02/01/2018 Time: 4801-6553 SLP Time Calculation (min) (ACUTE ONLY): 23 min  Assessment / Plan / Recommendation Clinical Impression  Paged by MD that pt is much more alert and requesting food/liquid. She did not exhibit dyspnea compared to this am, RR continued to hover in lower-mid 30's. She was alert and conversant without s/s aspiration across various consistencies. Educated re: increased risk of aspiration associated with COPD as well as her previously diagnosed presbyesophagus. Recommend regular texture, thin liquids, straws allowed and pills whole in applesauce. Will continue intervention for dysphagia.    HPI HPI: Patient is an 84 year old Caucasian female, with past medical history significant for COPD, presbyesophagus, CHF with an EF of 40 to 45%, paroxysmal atrial fibrillation, diabetes mellitus, hypertension, hyperlipidemia, aortic stenosis status post bioprosthetic aortic valve replacement. Admitted for respiratory distress and on BiPAP. Found to be in atrial fibrillation and tachycardic. Chest x-ray Focal opacities in the RIGHT UPPER lobe and bilateral LOWER lobes may indicate acute infectious process or asymmetric pulmonary edema. BSE 04/2017  symptoms appeared to be esophageal in nature.       SLP Plan  Continue with current plan of care       Recommendations  Diet recommendations: Regular;Thin liquid Liquids provided via: Cup;Straw Medication Administration: Whole meds with puree Supervision: Patient able to self feed;Intermittent supervision to cue for compensatory strategies Compensations: Slow rate;Small sips/bites Postural Changes and/or Swallow Maneuvers: Seated upright 90 degrees;Upright 30-60 min after meal                Oral Care Recommendations: Oral care BID Follow up Recommendations: None SLP Visit Diagnosis: Dysphagia,  pharyngeal phase (R13.13) Plan: Continue with current plan of care                       Houston Siren 02/01/2018, 3:52 PM   Orbie Pyo Colvin Caroli.Ed Risk analyst [phone] Office [phone]

## 2018-02-01 NOTE — Consult Note (Signed)
Consultation Note Date: 02/01/2018   Patient Name: Angela Hines  DOB: 11-17-1932  MRN: [id number]  Age / Sex: 84 y.o., female  PCP: Kelton Pillar, MD Referring Physician: Jonetta Osgood, MD  Reason for Consultation: Establishing goals of care  HPI/Patient Profile: 84 y.o. female  with past medical history of COPD, CHF (EF 40-45%), paroxysmal atrial fibrillation (on Xarelto), diabetes mellitus, hypertension, hyperlipidemia, aortic stenosis status post bioprosthetic aortic valve replacement admitted on 01/31/2018 with SOB and wheezing requiring BiPAP initially with COPD exacerbation and possible sepsis bacteremia.   Clinical Assessment and Goals of Care: I met today with [name] but she is acutely ill and unable to participate largely d/t SOB. She is off BiPAP and on nasal cannula but still with very labored breathing and tachypnea. Son/HCPOA, [name], is at bedside and we spoke privately.   [name] acknowledges his mother's physical decline as well as decline in QOL. He understands that her time is limited. We discussed that her overall prognosis is poor and I worry if she will be able to recover from this hospitalization. We discussed setting limits of no more BiPAP or escalation to aggressive measures of care. We also discussed the addition of comfort medications. [name] plans to speak with his brothers (one coming from [place] and other one he believes will plan to come from [place]). We discussed the possible need of transition to full comfort measures. We discussed possible hospice options and [name] had questions regarding cremation and funeral arrangements which I answered to my best ability. Emotional support provided.   [name] would like for palliative care to continue to follow and help discuss with his brothers. I believe that [name] will declare herself over the next 1-2 days with small  improvements or continued decline. I suspect that she may become eligible for hospice facility.   Primary Decision Maker HCPOA son [name]    SUMMARY OF RECOMMENDATIONS   - Comfort meds placed - Continue current therapies - NO BIPAP - Consider hospice/comfort care with further decline - Hopefully we can include patient in plan of care if she is medically able to do so  Code Status/Advance Care Planning:  DNR   Symptom Management:   SOB (and pain): Morphine IV 1-2 mg every 2 hours prn.   Anxiety: Lorazepam 0.5 mg IV or PO every 4 hours prn.   Palliative Prophylaxis:   Aspiration, Bowel Regimen, Delirium Protocol, Frequent Pain Assessment, Oral Care and Turn Reposition  Psycho-social/Spiritual:   Desire for further Chaplaincy support:yes  Additional Recommendations: Caregiving  Support/Resources, Education on Hospice and Grief/Bereavement Support  Prognosis:   Overall prognosis very poor. TBD if eligible for hospice facility vs hospice at home ([facility]). She would definitely benefit from hospice support.  Discharge Planning: To Be Determined      Primary Diagnoses: Present on Admission: . Sepsis ([place])   I have reviewed the medical record, interviewed the patient and family, and examined the patient. The following aspects are pertinent.  Past Medical History:  Diagnosis Date  .  Anginal pain ([place]) ~ 2007   "mini stroke affected her right eye; made it droop; fully recovered" (01/31/2018)  . Anxiety   . Aortic stenosis   . Arthritis    "joints" (01/31/2018)  . Atrial fibrillation ([place])   . CHF (congestive heart failure) ([place])   . Chronic bronchitis ([place])   . COPD (chronic obstructive pulmonary disease) ([place])   . Depressed affect   . Depression   . GERD (gastroesophageal reflux disease)   . Headache    "monthly" (01/31/2018)  . High cholesterol   . Hypertension   . Migraine    "none since <1990" (01/31/2018)  . Paroxysmal atrial fibrillation ([facility])   .  Pneumonia 2019  . Type II diabetes mellitus ([place])    "recently dc'd Metformin" (01/31/2018)   Social History   Socioeconomic History  . Marital status: Widowed    Spouse name: Not on file  . Number of children: Not on file  . Years of education: Not on file  . Highest education level: Not on file  Occupational History  . Not on file  Social Needs  . Financial resource strain: Not on file  . Food insecurity:    Worry: Not on file    Inability: Not on file  . Transportation needs:    Medical: Not on file    Non-medical: Not on file  Tobacco Use  . Smoking status: Never Smoker  . Smokeless tobacco: Never Used  Substance and Sexual Activity  . Alcohol use: Not Currently    Frequency: Never    Comment: 01/31/2018 "did drink ~ 1 glass of wine/wk; nothing in the last 6 months or more"  . Drug use: Never  . Sexual activity: Never  Lifestyle  . Physical activity:    Days per week: Not on file    Minutes per session: Not on file  . Stress: Not on file  Relationships  . Social connections:    Talks on phone: Not on file    Gets together: Not on file    Attends religious service: Not on file    Active member of club or organization: Not on file    Attends meetings of clubs or organizations: Not on file    Relationship status: Not on file  Other Topics Concern  . Not on file  Social History Narrative  . Not on file   Family History  Problem Relation Age of Onset  . Emphysema Father   . Hypertension Sister   . Lung disease Sister   . Hypertension Brother   . Hypertension Brother   . Stroke Brother   . Other Son        one spleen removed, one with chronic pain from MVA  . Diabetes Son   . Multiple myeloma Son   . Heart disease Son   . Heart attack Son    Scheduled Meds: . amiodarone  100 mg Oral Daily  . budesonide (PULMICORT) nebulizer solution  0.25 mg Nebulization BID  . feeding supplement (ENSURE ENLIVE)  237 mL Oral BID BM  . insulin aspart  0-9 Units  Subcutaneous TID WC  . ipratropium  0.5 mg Nebulization Q6H  . levalbuterol  0.63 mg Nebulization Q6H  . LORazepam  0.5 mg Oral TID  . methylPREDNISolone (SOLU-MEDROL) injection  40 mg Intravenous Q6H  . metoprolol tartrate  25 mg Oral Q8H  . Rivaroxaban  15 mg Oral Q supper   Continuous Infusions: . sodium chloride 250 mL (01/31/18 1502)  .  cefTRIAXone (ROCEPHIN)  IV Stopped (02/01/18 0701)  . diltiazem (CARDIZEM) infusion 15 mg/hr (02/01/18 0409)   PRN Meds:.sodium chloride, albuterol, LORazepam, metoprolol tartrate, morphine injection Allergies  Allergen Reactions  . Clonidine Swelling and Other (See Comments)    "head swelling"  . Captopril Swelling  . Codeine Nausea And Vomiting and Other (See Comments)    GI Intolerance   . Ampicillin Rash  . Ceclor [Cefaclor] Rash   Review of Systems  Unable to perform ROS: Acuity of condition    Physical Exam  Constitutional: She appears well-developed. She has a sickly appearance. She appears ill.  Frail, elderly, thin  HENT:  Head: Normocephalic and atraumatic.  Cardiovascular: An irregularly irregular rhythm present. Tachycardia present.  Pulmonary/Chest: No accessory muscle usage. Tachypnea noted. She is in respiratory distress. She has decreased breath sounds. She has rhonchi.  Abdominal: Soft. Normal appearance.  Neurological:  Fatigued, appears to be oriented but too SOB to fully assess  Nursing note and vitals reviewed.   Vital Signs: BP (!) 155/96   Pulse (!) 118   Temp 99 F (37.2 C) (Oral)   Resp (!) 28   Ht _0  (1.702 m)   Wt 49 kg   SpO2 94%   BMI 16.92 kg/m  Pain Scale: 0-10   Pain Score: 0-No pain   SpO2: SpO2: 94 % O2 Device:SpO2: 94 % O2 Flow Rate: .O2 Flow Rate (L/min): 3 L/min  IO: Intake/output summary:   Intake/Output Summary (Last 24 hours) at 02/01/2018 1100 Last data filed at 02/01/2018 0900 Gross per 24 hour  Intake 294.53 ml  Output -  Net 294.53 ml    LBM: Last BM Date:  01/30/18 Baseline Weight: Weight: 49 kg Most recent weight: Weight: 49 kg     Palliative Assessment/Data: 30%     Time In: 0930 Time Out: 1045 Time Total: 75 min Greater than 50%  of this time was spent counseling and coordinating care related to the above assessment and plan.  Signed by: Vinie Sill, NP Palliative Medicine Team Pager # [phone] (M-F 8a-5p) Team Phone # [phone] (Nights/Weekends)

## 2018-02-01 NOTE — Progress Notes (Signed)
PROGRESS NOTE        PATIENT DETAILS Name: Angela Hines Age: 84 y.o. Sex: female Date of Birth: May 04, 1932 Admit Date: 01/31/2018 Admitting Physician Bonnell Public, MD RWE:[id number], Margaretha Sheffield, MD  Brief Narrative: Patient is a 84 y.o. female with history of atrial fibrillation on anticoagulation, COPD, chronic combined systolic/diastolic heart failure, DM-2, bioprosthetic aortic valve replacement presenting with acute hypoxic respiratory failure felt to be secondary to COPD exacerbation, further evaluation revealed streptococcal bacteremia.  See below for further details  Subjective: Acute hypoxic respiratory failure: Felt to be secondary to COPD exacerbation-liberated off BiPAP this morning.  Continue steroids, bronchodilators.  DNR in place-palliative care evaluation pending.  Sepsis secondary to streptococcal bacteremia: Continue Rocephin-may need further work-up-but will await evaluation by palliative care team-she is very frail-with severe failure to thrive syndrome-suspect additional work-up may not change outcome.  Will await final culture results  Atrial fibrillation with RVR: Continue Cardizem infusion-restart beta-blocker and amiodarone.  We will slowly attempt to wean off Cardizem infusion.  Continue Xarelto.  Chronic combined systolic/diastolic heart failure: No obvious signs of volume overload-respiratory failure thought to be more from COPD.  Will start diuretics and follow volume status closely.  Failure to thrive syndrome/debility: May be best served by slowly transition to comfort care depending on clinical course-we will get PT/OT/SLP evaluation.  History of bioprosthetic AVR  Palliative care: DNR in place-we will await palliative care evaluation for further goals of care.  Has had numerous hospitalizations this year-she is very frail and deconditioned-may be best served by slowly transitioning to comfort measures if she does not  turnaround.  Assessment/Plan: Active Problems:   Sepsis ([place])  Telemetry (independently reviewed):Afib RVR  DVT Prophylaxis: Full dose anticoagulation with Xarelto  Code Status: DNR  Family Communication: None at bedside  Disposition Plan: Remain inpatient  Antimicrobial agents: Anti-infectives (From admission, onward)   Start     Dose/Rate Route Frequency Ordered Stop   02/01/18 0800  vancomycin (VANCOCIN) IVPB 750 mg/150 ml premix  Status:  Discontinued     750 mg 150 mL/hr over 60 Minutes Intravenous Every 24 hours 01/31/18 1102 01/31/18 2251   01/31/18 2300  cefTRIAXone (ROCEPHIN) 2 g in sodium chloride 0.9 % 100 mL IVPB     2 g 200 mL/hr over 30 Minutes Intravenous Every 24 hours 01/31/18 2252     01/31/18 1600  aztreonam (AZACTAM) 1 g in sodium chloride 0.9 % 100 mL IVPB  Status:  Discontinued     1 g 200 mL/hr over 30 Minutes Intravenous Every 8 hours 01/31/18 1102 01/31/18 2251   01/31/18 0700  aztreonam (AZACTAM) 2 g in sodium chloride 0.9 % 100 mL IVPB     2 g 200 mL/hr over 30 Minutes Intravenous  Once 01/31/18 0645 01/31/18 0826   01/31/18 0700  metroNIDAZOLE (FLAGYL) IVPB 500 mg  Status:  Discontinued     500 mg 100 mL/hr over 60 Minutes Intravenous Every 8 hours 01/31/18 0645 01/31/18 2251   01/31/18 0700  vancomycin (VANCOCIN) IVPB 1000 mg/200 mL premix     1,000 mg 200 mL/hr over 60 Minutes Intravenous  Once 01/31/18 0645 01/31/18 0834      Procedures: None  CONSULTS:  Palliative care  Time spent: 35 minutes-Greater than 50% of this time was spent in counseling, explanation of diagnosis, planning of further management, and coordination of care.  MEDICATIONS: Scheduled Meds: . amiodarone  100 mg Oral Daily  . budesonide (PULMICORT) nebulizer solution  0.25 mg Nebulization BID  . feeding supplement (ENSURE ENLIVE)  237 mL Oral BID BM  . insulin aspart  0-9 Units Subcutaneous TID WC  . ipratropium  0.5 mg Nebulization Q6H  . levalbuterol  0.63  mg Nebulization Q6H  . LORazepam  0.5 mg Oral TID  . methylPREDNISolone (SOLU-MEDROL) injection  40 mg Intravenous Q6H  . metoprolol tartrate  25 mg Oral Q8H  . Rivaroxaban  15 mg Oral Q supper   Continuous Infusions: . sodium chloride 250 mL (01/31/18 1502)  . cefTRIAXone (ROCEPHIN)  IV Stopped (02/01/18 0701)  . diltiazem (CARDIZEM) infusion 15 mg/hr (02/01/18 0409)   PRN Meds:.sodium chloride, albuterol, LORazepam, metoprolol tartrate, morphine injection   PHYSICAL EXAM: Vital signs: Vitals:   02/01/18 0830 02/01/18 0834 02/01/18 0941 02/01/18 1115  BP:   (!) 155/96   Pulse: (!) 132  (!) 118 90  Resp: (!) 24  (!) 28 (!) 28  Temp:      TempSrc:      SpO2: 97% 97% 94% 99%  Weight:      Height:       Filed Weights   01/31/18 0645  Weight: 49 kg   Body mass index is 16.92 kg/m.   General appearance :Awake, alert, not in any distress. Speech Clear Eyes:.Pink conjunctiva HEENT: Atraumatic and Normocephalic Neck: supple Resp:Good air entry bilaterally, only a few scattered rhonchi but no rales. CVS: S1 S2 regular, no murmurs.  GI: Bowel sounds present, Non tender and not distended with no gaurding, rigidity or rebound. Extremities: B/L Lower Ext shows no edema, both legs are warm to touch Neurology:  speech clear,Non focal, sensation is grossly intact. Musculoskeletal:No digital cyanosis Skin:No Rash, warm and dry Wounds:N/A  I have personally reviewed following labs and imaging studies  LABORATORY DATA: CBC: Recent Labs  Lab 01/31/18 0559 01/31/18 1444 02/01/18 0138  WBC 18.1* 24.6* 18.9*  NEUTROABS 11.2* 22.8*  --   HGB 7.8* 7.8* 7.6*  HCT 28.7* 30.2* 26.9*  MCV 84.7 86.0 81.5  PLT 455* 412* 412    Basic Metabolic Panel: Recent Labs  Lab 01/31/18 0559 01/31/18 1444 02/01/18 0833  NA 136 138 138  K 4.2 5.0 4.0  CL 106 107 107  CO2 20* 18* 21*  GLUCOSE 330* 174* 182*  BUN 15 19 18   CREATININE 1.01* 0.85 0.64  CALCIUM 8.6* 8.5* 8.7*  MG  --   1.8  --   PHOS  --  4.9*  --     GFR: Estimated Creatinine Clearance: 41.2 mL/min (by C-G formula based on SCr of 0.64 mg/dL).  Liver Function Tests: Recent Labs  Lab 01/31/18 0559 01/31/18 1444  AST 81* 232*  ALT 52* 214*  ALKPHOS 108 125  BILITOT 0.4 0.6  PROT 6.1* 6.2*  ALBUMIN 3.5 3.4*   No results for input(s): LIPASE, AMYLASE in the last 168 hours. No results for input(s): AMMONIA in the last 168 hours.  Coagulation Profile: Recent Labs  Lab 01/31/18 1444  INR 1.26    Cardiac Enzymes: Recent Labs  Lab 01/31/18 1444 01/31/18 2018 02/01/18 0138  TROPONINI 0.05* 0.09* 0.12*    BNP (last 3 results) No results for input(s): PROBNP in the last 8760 hours.  HbA1C: No results for input(s): HGBA1C in the last 72 hours.  CBG: Recent Labs  Lab 01/31/18 1645 01/31/18 2132 02/01/18 0756  GLUCAP 143* 152* 172*  Lipid Profile: No results for input(s): CHOL, HDL, LDLCALC, TRIG, CHOLHDL, LDLDIRECT in the last 72 hours.  Thyroid Function Tests: No results for input(s): TSH, T4TOTAL, FREET4, T3FREE, THYROIDAB in the last 72 hours.  Anemia Panel: No results for input(s): VITAMINB12, FOLATE, FERRITIN, TIBC, IRON, RETICCTPCT in the last 72 hours.  Urine analysis:    Component Value Date/Time   COLORURINE YELLOW 01/31/2018 0645   APPEARANCEUR CLEAR 01/31/2018 0645   LABSPEC 1.018 01/31/2018 0645   PHURINE 6.0 01/31/2018 0645   GLUCOSEU 150 (A) 01/31/2018 0645   HGBUR NEGATIVE 01/31/2018 0645   BILIRUBINUR NEGATIVE 01/31/2018 0645   KETONESUR NEGATIVE 01/31/2018 0645   PROTEINUR 30 (A) 01/31/2018 0645   NITRITE NEGATIVE 01/31/2018 0645   LEUKOCYTESUR NEGATIVE 01/31/2018 0645    Sepsis Labs: Lactic Acid, Venous    Component Value Date/Time   LATICACIDVEN 3.3 ([place]) 01/31/2018 1629    MICROBIOLOGY: Recent Results (from the past 240 hour(s))  Blood Culture (routine x 2)     Status: Abnormal (Preliminary result)   Collection Time: 01/31/18  6:54 AM   Result Value Ref Range Status   Specimen Description BLOOD RIGHT FOREARM  Final   Special Requests   Final    BOTTLES DRAWN AEROBIC AND ANAEROBIC Blood Culture results may not be optimal due to an inadequate volume of blood received in culture bottles   Culture  Setup Time   Final    GRAM POSITIVE COCCI IN CHAINS BLOOD ANAEROBIC BOTTLE CRITICAL RESULT CALLED TO, READ BACK BY AND VERIFIED WITH: Carlye Grippe PHARM 01/31/18 2214 [place] Performed at [facility], [place] [address] [place], [place] [address]    Culture STREPTOCOCCUS SPECIES (A)  Final   Report Status PENDING  Incomplete  Blood Culture ID Panel (Reflexed)     Status: Abnormal   Collection Time: 01/31/18  6:54 AM  Result Value Ref Range Status   Enterococcus species NOT DETECTED NOT DETECTED Final   Listeria monocytogenes NOT DETECTED NOT DETECTED Final   Staphylococcus species NOT DETECTED NOT DETECTED Final   Staphylococcus aureus (BCID) NOT DETECTED NOT DETECTED Final   Streptococcus species DETECTED (A) NOT DETECTED Final    Comment: Not Enterococcus species, Streptococcus agalactiae, Streptococcus pyogenes, or Streptococcus pneumoniae. CRITICAL RESULT CALLED TO, READ BACK BY AND VERIFIED WITH: N. JOHNSTON PHARM 01/31/18 2214 BEAMJ    Streptococcus agalactiae NOT DETECTED NOT DETECTED Final   Streptococcus pneumoniae NOT DETECTED NOT DETECTED Final   Streptococcus pyogenes NOT DETECTED NOT DETECTED Final   Acinetobacter baumannii NOT DETECTED NOT DETECTED Final   Enterobacteriaceae species NOT DETECTED NOT DETECTED Final   Enterobacter cloacae complex NOT DETECTED NOT DETECTED Final   Escherichia coli NOT DETECTED NOT DETECTED Final   Klebsiella oxytoca NOT DETECTED NOT DETECTED Final   Klebsiella pneumoniae NOT DETECTED NOT DETECTED Final   Proteus species NOT DETECTED NOT DETECTED Final   Serratia marcescens NOT DETECTED NOT DETECTED Final   Haemophilus influenzae NOT DETECTED NOT DETECTED Final   Neisseria  meningitidis NOT DETECTED NOT DETECTED Final   Pseudomonas aeruginosa NOT DETECTED NOT DETECTED Final   Candida albicans NOT DETECTED NOT DETECTED Final   Candida glabrata NOT DETECTED NOT DETECTED Final   Candida krusei NOT DETECTED NOT DETECTED Final   Candida parapsilosis NOT DETECTED NOT DETECTED Final   Candida tropicalis NOT DETECTED NOT DETECTED Final    Comment: Performed at [facility], [place] [address] [place], [place] [address]  Urine culture     Status: None  Collection Time: 01/31/18  8:15 AM  Result Value Ref Range Status   Specimen Description URINE, CATHETERIZED  Final   Special Requests NONE  Final   Culture   Final    NO GROWTH Performed at [facility], [address], [place], [place] [address]    Report Status 02/01/2018 FINAL  Final  MRSA PCR Screening     Status: None   Collection Time: 01/31/18  1:59 PM  Result Value Ref Range Status   MRSA by PCR NEGATIVE NEGATIVE Final    Comment:        The GeneXpert MRSA Assay (FDA approved for NASAL specimens only), is one component of a comprehensive MRSA colonization surveillance program. It is not intended to diagnose MRSA infection nor to guide or monitor treatment for MRSA infections. Performed at [facility], [place] [address] [place], [place] [address]     RADIOLOGY STUDIES/RESULTS: Dg Chest Port 1 View  Result Date: 01/31/2018 CLINICAL DATA:  Shortness of breath tonight. EXAM: PORTABLE CHEST 1 VIEW COMPARISON:  12/17/2017 FINDINGS: Postoperative changes in the mediastinum. Cardiac valve prosthesis. Mild cardiac enlargement. No pulmonary vascular congestion. Emphysematous changes are suggested in the lungs with perihilar streaky opacities and interstitial infiltrates in the lung bases suggesting chronic bronchitis and fibrosis. No focal consolidation. No blunting of costophrenic angles. No pneumothorax. Mediastinal contours appear intact. IMPRESSION: Emphysematous and chronic bronchitic  changes in the lungs. No focal consolidation. Electronically Signed   By: Lucienne Capers M.D.   On: 01/31/2018 06:19   Dg Chest Port 1v Same Day  Result Date: 02/01/2018 CLINICAL DATA:  SOB Hx of atrial fib, CHF, COPD, HTN, DM2 EXAM: PORTABLE CHEST 1 VIEW COMPARISON:  01/31/2018 FINDINGS: Status post median sternotomy. Valve replacement. Patient has a loop recorder overlying the central chest. Lungs are mildly hyperinflated. There is prominence of interstitial markings, stable over multiple prior studies. Focal opacities are identified in the RIGHT UPPER lobe, LEFT LOWER lobe, RIGHT LOWER lobe, raising the question of acute superimposed infectious process. No pulmonary edema. IMPRESSION: Cardiomegaly and interstitial lung disease. Focal opacities in the RIGHT UPPER lobe and bilateral LOWER lobes may indicate acute infectious process or asymmetric pulmonary edema. Electronically Signed   By: Nolon Nations M.D.   On: 02/01/2018 09:15     LOS: 1 day   Oren Binet, MD  [facility]  If 7PM-7AM, please contact night-coverage  Please page via [email or web address]-Password [email or web address]-click on MD name and type text message  02/01/2018, 11:42 AM

## 2018-02-01 NOTE — Progress Notes (Signed)
Chaplain responded to spiritual care consult.  Pt's son and wife were outside door.  Provided emotional support to them before entering. Pt was engaging and oriented,  And "better than yesterday." Provided ministry of presence, scripture and prayer.  Will refer to unit chaplain on 2M. Tamsen Snider [phone]

## 2018-02-02 ENCOUNTER — Other Ambulatory Visit (HOSPITAL_COMMUNITY): Payer: Medicare Other

## 2018-02-02 DIAGNOSIS — Z950 Presence of cardiac pacemaker: Secondary | ICD-10-CM

## 2018-02-02 DIAGNOSIS — R7881 Bacteremia: Secondary | ICD-10-CM

## 2018-02-02 DIAGNOSIS — I509 Heart failure, unspecified: Secondary | ICD-10-CM

## 2018-02-02 DIAGNOSIS — A400 Sepsis due to streptococcus, group A: Secondary | ICD-10-CM

## 2018-02-02 DIAGNOSIS — E119 Type 2 diabetes mellitus without complications: Secondary | ICD-10-CM

## 2018-02-02 DIAGNOSIS — B954 Other streptococcus as the cause of diseases classified elsewhere: Secondary | ICD-10-CM

## 2018-02-02 DIAGNOSIS — J9601 Acute respiratory failure with hypoxia: Secondary | ICD-10-CM

## 2018-02-02 DIAGNOSIS — R011 Cardiac murmur, unspecified: Secondary | ICD-10-CM

## 2018-02-02 DIAGNOSIS — Z952 Presence of prosthetic heart valve: Secondary | ICD-10-CM

## 2018-02-02 DIAGNOSIS — J449 Chronic obstructive pulmonary disease, unspecified: Secondary | ICD-10-CM

## 2018-02-02 LAB — BASIC METABOLIC PANEL
Anion gap: 11 (ref 5–15)
BUN: 27 mg/dL — AB (ref 8–23)
CHLORIDE: 105 mmol/L (ref 98–111)
CO2: 22 mmol/L (ref 22–32)
CREATININE: 0.69 mg/dL (ref 0.44–1.00)
Calcium: 9.1 mg/dL (ref 8.9–10.3)
GFR calc Af Amer: >60 mL/min (ref >60)
Glucose, Bld: 183 mg/dL — ABNORMAL HIGH (ref 70–99)
Potassium: 4.3 mmol/L (ref 3.5–5.1)
SODIUM: 138 mmol/L (ref 135–145)

## 2018-02-02 LAB — CBC
HCT: 27.8 % — ABNORMAL LOW (ref 36.0–46.0)
HEMOGLOBIN: 7.6 g/dL — AB (ref 12.0–15.0)
MCH: 22 pg — ABNORMAL LOW (ref 26.0–34.0)
MCHC: 27.3 g/dL — AB (ref 30.0–36.0)
MCV: 80.6 fL (ref 80.0–100.0)
PLATELETS: 363 10*3/uL (ref 150–400)
RBC: 3.45 MIL/uL — AB (ref 3.87–5.11)
RDW: 17.4 % — ABNORMAL HIGH (ref 11.5–15.5)
WBC: 16.4 10*3/uL — AB (ref 4.0–10.5)
nRBC: 0.4 % — ABNORMAL HIGH (ref 0.0–0.2)

## 2018-02-02 LAB — CULTURE, BLOOD (ROUTINE X 2)

## 2018-02-02 LAB — GLUCOSE, CAPILLARY
Glucose-Capillary: 184 mg/dL — ABNORMAL HIGH (ref 70–99)
Glucose-Capillary: 158 mg/dL — ABNORMAL HIGH (ref 70–99)
Glucose-Capillary: 144 mg/dL — ABNORMAL HIGH (ref 70–99)
Glucose-Capillary: 148 mg/dL — ABNORMAL HIGH (ref 70–99)

## 2018-02-02 MED ORDER — METOPROLOL TARTRATE 50 MG PO TABS
50.0000 mg | ORAL_TABLET | Freq: Three times a day (TID) | ORAL | Status: DC
  Administered 2018-02-02 – 2018-02-05 (×8): 50 mg via ORAL
  Filled 2018-02-02 (×10): qty 1

## 2018-02-02 MED ORDER — IPRATROPIUM BROMIDE 0.02 % IN SOLN
0.5000 mg | Freq: Three times a day (TID) | RESPIRATORY_TRACT | Status: DC
  Administered 2018-02-02 – 2018-02-03 (×4): 0.5 mg via RESPIRATORY_TRACT
  Filled 2018-02-02 (×5): qty 2.5

## 2018-02-02 MED ORDER — LEVALBUTEROL HCL 0.63 MG/3ML IN NEBU
0.6300 mg | INHALATION_SOLUTION | Freq: Three times a day (TID) | RESPIRATORY_TRACT | Status: DC
  Administered 2018-02-02 – 2018-02-03 (×4): 0.63 mg via RESPIRATORY_TRACT
  Filled 2018-02-02 (×5): qty 3

## 2018-02-02 MED ORDER — FUROSEMIDE 20 MG PO TABS
20.0000 mg | ORAL_TABLET | Freq: Every day | ORAL | Status: DC
  Administered 2018-02-02 – 2018-02-05 (×4): 20 mg via ORAL
  Filled 2018-02-02 (×4): qty 1

## 2018-02-02 NOTE — Consult Note (Signed)
[facility]    Date of Admission:  01/31/2018   Total days of antibiotics: 1 ceftriaxone               Reason for Consult: Viridans strep bacteremia    Referring Provider: Ghimire   Assessment: Viridans strep bacteremia Bioprosthetic AVR ~ 15 yr ago Loop recorder 2016 DM2 COPD  Plan: 1. Would not check TEE given her comorbidities 2. Continue ceftriaxone- she is tolerating well despite her allergy list.  3. Repeat BCx 4. Check TTE 5. Appreciate palliative care eval.   Comment-  Give recent study on treating pt's with short course IV (~ 10 days avg) followed by 6 weeks of po, this may be reasonable for her to avoid PIC. Prosthetic valves were not part of that study however. This is explained to pt and her son.   Thank you so much for this truly interesting consult on this absolutely endearing patient,  Active Problems:   Sepsis ([place])   Respiratory distress   Goals of care, counseling/discussion   Palliative care encounter   . amiodarone  100 mg Oral Daily  . budesonide (PULMICORT) nebulizer solution  0.25 mg Nebulization BID  . feeding supplement (ENSURE ENLIVE)  237 mL Oral BID BM  . furosemide  20 mg Oral Daily  . insulin aspart  0-9 Units Subcutaneous TID WC  . ipratropium  0.5 mg Nebulization TID  . levalbuterol  0.63 mg Nebulization TID  . methylPREDNISolone (SOLU-MEDROL) injection  40 mg Intravenous Q8H  . metoprolol tartrate  50 mg Oral Q8H  . multivitamin with minerals  1 tablet Oral Daily  . Rivaroxaban  15 mg Oral Q supper    HPI: Angela Hines is a 84 y.o. female with DM2, COPD, CHF, bioprosthetic AVR and pacer, adm on 10-10 with several days of worsening SOB and diaphoresis. In ED was afebrile (R), hypertensive and tachycardic.  Her WBC was 18.1, Glc 330 and BNP 1,377.6. Lactate 3.85. She was found to be in afib with RVR and was started on IV cardizem.  She was started on azactam/flagyl/vanco and steroids. She  required BiPAP for < 24h.  Within 24h her BCx were positive for Viridans strep. Her anbx were narrowed to ceftriaxone.  She has been evaluated by palliative care.  She has been afebrile. Currently feels that her breathing is back to baseline.   Review of Systems: Review of Systems  Constitutional: Negative for chills and fever.  Respiratory: Positive for shortness of breath.   Cardiovascular: Negative for leg swelling.  Gastrointestinal: Negative for constipation and diarrhea.  Genitourinary: Negative for dysuria.  Please see HPI. All other systems reviewed and negative.   Past Medical History:  Diagnosis Date  . Anginal pain ([place]) ~ 2007   "mini stroke affected her right eye; made it droop; fully recovered" (01/31/2018)  . Anxiety   . Aortic stenosis   . Arthritis    "joints" (01/31/2018)  . Atrial fibrillation ([place])   . CHF (congestive heart failure) ([place])   . Chronic bronchitis ([place])   . COPD (chronic obstructive pulmonary disease) ([place])   . Depressed affect   . Depression   . GERD (gastroesophageal reflux disease)   . Headache    "monthly" (01/31/2018)  . High cholesterol   . Hypertension   . Migraine    "none since <1990" (01/31/2018)  . Paroxysmal atrial fibrillation ([facility])   . Pneumonia 2019  . Type II diabetes mellitus (  [place])    "recently dc'd Metformin" (01/31/2018)    Social History   Tobacco Use  . Smoking status: Never Smoker  . Smokeless tobacco: Never Used  Substance Use Topics  . Alcohol use: Not Currently    Frequency: Never    Comment: 01/31/2018 "did drink ~ 1 glass of wine/wk; nothing in the last 6 months or more"  . Drug use: Never    Family History  Problem Relation Age of Onset  . Emphysema Father   . Hypertension Sister   . Lung disease Sister   . Hypertension Brother   . Hypertension Brother   . Stroke Brother   . Other Son        one spleen removed, one with chronic pain from MVA  . Diabetes Son   . Multiple myeloma Son   .  Heart disease Son   . Heart attack Son      Medications:  Scheduled: . amiodarone  100 mg Oral Daily  . budesonide (PULMICORT) nebulizer solution  0.25 mg Nebulization BID  . feeding supplement (ENSURE ENLIVE)  237 mL Oral BID BM  . furosemide  20 mg Oral Daily  . insulin aspart  0-9 Units Subcutaneous TID WC  . ipratropium  0.5 mg Nebulization TID  . levalbuterol  0.63 mg Nebulization TID  . methylPREDNISolone (SOLU-MEDROL) injection  40 mg Intravenous Q8H  . metoprolol tartrate  50 mg Oral Q8H  . multivitamin with minerals  1 tablet Oral Daily  . Rivaroxaban  15 mg Oral Q supper    Abtx:  Anti-infectives (From admission, onward)   Start     Dose/Rate Route Frequency Ordered Stop   02/01/18 0800  vancomycin (VANCOCIN) IVPB 750 mg/150 ml premix  Status:  Discontinued     750 mg 150 mL/hr over 60 Minutes Intravenous Every 24 hours 01/31/18 1102 01/31/18 2251   01/31/18 2300  cefTRIAXone (ROCEPHIN) 2 g in sodium chloride 0.9 % 100 mL IVPB     2 g 200 mL/hr over 30 Minutes Intravenous Every 24 hours 01/31/18 2252     01/31/18 1600  aztreonam (AZACTAM) 1 g in sodium chloride 0.9 % 100 mL IVPB  Status:  Discontinued     1 g 200 mL/hr over 30 Minutes Intravenous Every 8 hours 01/31/18 1102 01/31/18 2251   01/31/18 0700  aztreonam (AZACTAM) 2 g in sodium chloride 0.9 % 100 mL IVPB     2 g 200 mL/hr over 30 Minutes Intravenous  Once 01/31/18 0645 01/31/18 0826   01/31/18 0700  metroNIDAZOLE (FLAGYL) IVPB 500 mg  Status:  Discontinued     500 mg 100 mL/hr over 60 Minutes Intravenous Every 8 hours 01/31/18 0645 01/31/18 2251   01/31/18 0700  vancomycin (VANCOCIN) IVPB 1000 mg/200 mL premix     1,000 mg 200 mL/hr over 60 Minutes Intravenous  Once 01/31/18 0645 01/31/18 0834        OBJECTIVE: Blood pressure 126/90, pulse (!) 109, temperature 98.7 F (37.1 C), temperature source Oral, resp. rate 18, height 5' 7"  (1.702 m), weight 49 kg, SpO2 92 %.  Physical Exam  Constitutional:  She is oriented to person, place, and time.  Non-toxic appearance. She does not appear ill. No distress.  HENT:  Mouth/Throat: No posterior oropharyngeal edema.  Eyes: Pupils are equal, round, and reactive to light. EOM are normal.  Neck: Normal range of motion. Neck supple.  Cardiovascular: An irregularly irregular rhythm present.  Murmur heard.  Crescendo systolic murmur is present with  a grade of 4/6. Pulmonary/Chest: Effort normal and breath sounds normal.  Abdominal: Soft. Bowel sounds are normal. She exhibits no distension. There is no tenderness.  Musculoskeletal: She exhibits no edema.  Lymphadenopathy:    She has no cervical adenopathy.  Neurological: She is alert and oriented to person, place, and time.  Skin: Skin is warm and dry. No erythema.  Psychiatric: She has a normal mood and affect.    Lab Results Results for orders placed or performed during the hospital encounter of 01/31/18 (from the past 48 hour(s))  MRSA PCR Screening     Status: None   Collection Time: 01/31/18  1:59 PM  Result Value Ref Range   MRSA by PCR NEGATIVE NEGATIVE    Comment:        The GeneXpert MRSA Assay (FDA approved for NASAL specimens only), is one component of a comprehensive MRSA colonization surveillance program. It is not intended to diagnose MRSA infection nor to guide or monitor treatment for MRSA infections. Performed at [facility], [place] [address], [place], [place] [address]   Culture, blood (x 2)     Status: None (Preliminary result)   Collection Time: 01/31/18  2:44 PM  Result Value Ref Range   Specimen Description BLOOD RIGHT ANTECUBITAL    Special Requests      BOTTLES DRAWN AEROBIC ONLY Blood Culture results may not be optimal due to an inadequate volume of blood received in culture bottles   Culture      NO GROWTH 2 DAYS Performed at [facility] [address] [place], [place] [address]    Report Status PENDING   CBC with Differential     Status:  Abnormal   Collection Time: 01/31/18  2:44 PM  Result Value Ref Range   WBC 24.6 (H) 4.0 - 10.5 K/uL   RBC 3.51 (L) 3.87 - 5.11 MIL/uL   Hemoglobin 7.8 (L) 12.0 - 15.0 g/dL   HCT 30.2 (L) 36.0 - 46.0 %   MCV 86.0 80.0 - 100.0 fL   MCH 22.2 (L) 26.0 - 34.0 pg   MCHC 25.8 (L) 30.0 - 36.0 g/dL   RDW 17.2 (H) 11.5 - 15.5 %   Platelets 412 (H) 150 - 400 K/uL   nRBC 0.1 0.0 - 0.2 %   Neutrophils Relative % 92 %   Neutro Abs 22.8 (H) 1.7 - 7.7 K/uL   Lymphocytes Relative 2 %   Lymphs Abs 0.5 (L) 0.7 - 4.0 K/uL   Monocytes Relative 4 %   Monocytes Absolute 0.9 0.1 - 1.0 K/uL   Eosinophils Relative 0 %   Eosinophils Absolute 0.0 0.0 - 0.5 K/uL   Basophils Relative 0 %   Basophils Absolute 0.0 0.0 - 0.1 K/uL   Immature Granulocytes 2 %   Abs Immature Granulocytes 0.38 (H) 0.00 - 0.07 K/uL    Comment: Performed at [facility] [address], [place], [place] [address]  Comprehensive metabolic panel     Status: Abnormal   Collection Time: 01/31/18  2:44 PM  Result Value Ref Range   Sodium 138 135 - 145 mmol/L   Potassium 5.0 3.5 - 5.1 mmol/L   Chloride 107 98 - 111 mmol/L   CO2 18 (L) 22 - 32 mmol/L   Glucose, Bld 174 (H) 70 - 99 mg/dL   BUN 19 8 - 23 mg/dL   Creatinine, Ser 0.85 0.44 - 1.00 mg/dL   Calcium 8.5 (L) 8.9 - 10.3 mg/dL  Total Protein 6.2 (L) 6.5 - 8.1 g/dL   Albumin 3.4 (L) 3.5 - 5.0 g/dL   AST 232 (H) 15 - 41 U/L   ALT 214 (H) 0 - 44 U/L   Alkaline Phosphatase 125 38 - 126 U/L   Total Bilirubin 0.6 0.3 - 1.2 mg/dL   GFR calc non Af Amer >60 >60 mL/min   GFR calc Af Amer >60 >60 mL/min    Comment: (NOTE) The eGFR has been calculated using the CKD EPI equation. This calculation has not been validated in all clinical situations. eGFR's persistently <60 mL/min signify possible Chronic Kidney Disease.    Anion gap 13 5 - 15    Comment: Performed at [facility] [address], [place], [place] [address]  Lactic acid, plasma     Status: Abnormal    Collection Time: 01/31/18  2:44 PM  Result Value Ref Range   Lactic Acid, Venous 3.7 (HH) 0.5 - 1.9 mmol/L    Comment: CRITICAL RESULT CALLED TO, READ BACK BY AND VERIFIED WITH: S COX,RN 1624 01/31/2018 WBOND Performed at [facility], [place] [address] [place], [place] [address]   Procalcitonin     Status: None   Collection Time: 01/31/18  2:44 PM  Result Value Ref Range   Procalcitonin <0.10 ng/mL    Comment:        Interpretation: PCT (Procalcitonin) <= 0.5 ng/mL: Systemic infection (sepsis) is not likely. Local bacterial infection is possible. (NOTE)       Sepsis PCT Algorithm           Lower Respiratory Tract                                      Infection PCT Algorithm    ----------------------------     ----------------------------         PCT < 0.25 ng/mL                PCT < 0.10 ng/mL         Strongly encourage             Strongly discourage   discontinuation of antibiotics    initiation of antibiotics    ----------------------------     -----------------------------       PCT 0.25 - 0.50 ng/mL            PCT 0.10 - 0.25 ng/mL               OR       >80% decrease in PCT            Discourage initiation of                                            antibiotics      Encourage discontinuation           of antibiotics    ----------------------------     -----------------------------         PCT >= 0.50 ng/mL              PCT 0.26 - 0.50 ng/mL               AND        <80% decrease in PCT  Encourage initiation of                                             antibiotics       Encourage continuation           of antibiotics    ----------------------------     -----------------------------        PCT >= 0.50 ng/mL                  PCT > 0.50 ng/mL               AND         increase in PCT                  Strongly encourage                                      initiation of antibiotics    Strongly encourage escalation           of antibiotics                                      -----------------------------                                           PCT <= 0.25 ng/mL                                                 OR                                        > 80% decrease in PCT                                     Discontinue / Do not initiate                                             antibiotics Performed at [facility], [address], [place], [place] [address]   Protime-INR     Status: Abnormal   Collection Time: 01/31/18  2:44 PM  Result Value Ref Range   Prothrombin Time 15.7 (H) 11.4 - 15.2 seconds   INR 1.26     Comment: Performed at [facility] [address], [place], [place] [address]  APTT     Status: None   Collection Time: 01/31/18  2:44 PM  Result Value Ref Range   aPTT 31 24 - 36 seconds    Comment: Performed at [facility] [address], [place], [place] [address]  Magnesium     Status: None   Collection Time:  01/31/18  2:44 PM  Result Value Ref Range   Magnesium 1.8 1.7 - 2.4 mg/dL    Comment: Performed at [facility], [place] [address], [place], [place] [address]  Phosphorus     Status: Abnormal   Collection Time: 01/31/18  2:44 PM  Result Value Ref Range   Phosphorus 4.9 (H) 2.5 - 4.6 mg/dL    Comment: Performed at [facility] [address], [place], [place] [address]  Troponin I     Status: Abnormal   Collection Time: 01/31/18  2:44 PM  Result Value Ref Range   Troponin I 0.05 (HH) <0.03 ng/mL    Comment: CRITICAL RESULT CALLED TO, READ BACK BY AND VERIFIED WITH: S COX,RN 1622 01/31/2018 WBOND Performed at [facility], [place] [address] [place], [place] [address]   Lactic acid, plasma     Status: Abnormal   Collection Time: 01/31/18  4:29 PM  Result Value Ref Range   Lactic Acid, Venous 3.3 (HH) 0.5 - 1.9 mmol/L    Comment: CRITICAL RESULT CALLED TO, READ BACK BY AND VERIFIED WITH: Jeannie Done 1941 01/31/2018 WBOND Performed at [facility], [place]  [address] [place], [place] [address]   Glucose, capillary     Status: Abnormal   Collection Time: 01/31/18  4:45 PM  Result Value Ref Range   Glucose-Capillary 143 (H) 70 - 99 mg/dL  Troponin I     Status: Abnormal   Collection Time: 01/31/18  8:18 PM  Result Value Ref Range   Troponin I 0.09 (HH) <0.03 ng/mL    Comment: CRITICAL VALUE NOTED.  VALUE IS CONSISTENT WITH PREVIOUSLY REPORTED AND CALLED VALUE. Performed at [facility], [place] [address], [place], [place] [address]   Glucose, capillary     Status: Abnormal   Collection Time: 01/31/18  9:32 PM  Result Value Ref Range   Glucose-Capillary 152 (H) 70 - 99 mg/dL  Troponin I     Status: Abnormal   Collection Time: 02/01/18  1:38 AM  Result Value Ref Range   Troponin I 0.12 (HH) <0.03 ng/mL    Comment: CRITICAL VALUE NOTED.  VALUE IS CONSISTENT WITH PREVIOUSLY REPORTED AND CALLED VALUE. Performed at [facility], [place] [address], [place], [place] [address]   CBC     Status: Abnormal   Collection Time: 02/01/18  1:38 AM  Result Value Ref Range   WBC 18.9 (H) 4.0 - 10.5 K/uL   RBC 3.30 (L) 3.87 - 5.11 MIL/uL   Hemoglobin 7.6 (L) 12.0 - 15.0 g/dL   HCT 26.9 (L) 36.0 - 46.0 %   MCV 81.5 80.0 - 100.0 fL   MCH 23.0 (L) 26.0 - 34.0 pg   MCHC 28.3 (L) 30.0 - 36.0 g/dL   RDW 17.0 (H) 11.5 - 15.5 %   Platelets 352 150 - 400 K/uL   nRBC 0.2 0.0 - 0.2 %    Comment: Performed at [facility], [place] [address], [place], [place] [address]  Glucose, capillary     Status: Abnormal   Collection Time: 02/01/18  7:56 AM  Result Value Ref Range   Glucose-Capillary 172 (H) 70 - 99 mg/dL   Comment 1 Notify RN   Basic metabolic panel     Status: Abnormal   Collection Time: 02/01/18  8:33 AM  Result Value Ref Range   Sodium 138 135 - 145 mmol/L   Potassium 4.0 3.5 - 5.1 mmol/L  Chloride 107 98 - 111 mmol/L   CO2 21 (L) 22 - 32 mmol/L   Glucose, Bld 182 (H) 70 - 99 mg/dL   BUN 18 8 - 23 mg/dL   Creatinine, Ser 0.64 0.44 - 1.00  mg/dL   Calcium 8.7 (L) 8.9 - 10.3 mg/dL   GFR calc non Af Amer >60 >60 mL/min   GFR calc Af Amer >60 >60 mL/min    Comment: (NOTE) The eGFR has been calculated using the CKD EPI equation. This calculation has not been validated in all clinical situations. eGFR's persistently <60 mL/min signify possible Chronic Kidney Disease.    Anion gap 10 5 - 15    Comment: Performed at [facility] [address] [place], [place] [address]  Glucose, capillary     Status: Abnormal   Collection Time: 02/01/18 11:45 AM  Result Value Ref Range   Glucose-Capillary 171 (H) 70 - 99 mg/dL   Comment 1 Notify RN   Glucose, capillary     Status: Abnormal   Collection Time: 02/01/18  4:25 PM  Result Value Ref Range   Glucose-Capillary 192 (H) 70 - 99 mg/dL   Comment 1 Notify RN   Glucose, capillary     Status: Abnormal   Collection Time: 02/01/18  9:34 PM  Result Value Ref Range   Glucose-Capillary 133 (H) 70 - 99 mg/dL  CBC     Status: Abnormal   Collection Time: 02/02/18  3:06 AM  Result Value Ref Range   WBC 16.4 (H) 4.0 - 10.5 K/uL   RBC 3.45 (L) 3.87 - 5.11 MIL/uL   Hemoglobin 7.6 (L) 12.0 - 15.0 g/dL   HCT 27.8 (L) 36.0 - 46.0 %   MCV 80.6 80.0 - 100.0 fL   MCH 22.0 (L) 26.0 - 34.0 pg   MCHC 27.3 (L) 30.0 - 36.0 g/dL   RDW 17.4 (H) 11.5 - 15.5 %   Platelets 363 150 - 400 K/uL   nRBC 0.4 (H) 0.0 - 0.2 %    Comment: Performed at [facility], [place] [address] [place] [address], [place], [place] [address]  Basic metabolic panel     Status: Abnormal   Collection Time: 02/02/18  3:06 AM  Result Value Ref Range   Sodium 138 135 - 145 mmol/L   Potassium 4.3 3.5 - 5.1 mmol/L   Chloride 105 98 - 111 mmol/L   CO2 22 22 - 32 mmol/L   Glucose, Bld 183 (H) 70 - 99 mg/dL   BUN 27 (H) 8 - 23 mg/dL   Creatinine, Ser 0.69 0.44 - 1.00 mg/dL   Calcium 9.1 8.9 - 10.3 mg/dL   GFR calc non Af Amer >60 >60 mL/min   GFR calc Af Amer >60 >60 mL/min    Comment: (NOTE) The eGFR has been calculated using the CKD  EPI equation. This calculation has not been validated in all clinical situations. eGFR's persistently <60 mL/min signify possible Chronic Kidney Disease.    Anion gap 11 5 - 15    Comment: Performed at [facility] [address] [place], [place] [address]  Glucose, capillary     Status: Abnormal   Collection Time: 02/02/18  7:36 AM  Result Value Ref Range   Glucose-Capillary 184 (H) 70 - 99 mg/dL  Glucose, capillary     Status: Abnormal   Collection Time: 02/02/18 11:51 AM  Result Value Ref Range   Glucose-Capillary 158 (H) 70 - 99 mg/dL  Component Value Date/Time   SDES BLOOD RIGHT ANTECUBITAL 01/31/2018 1444   SPECREQUEST  01/31/2018 1444    BOTTLES DRAWN AEROBIC ONLY Blood Culture results may not be optimal due to an inadequate volume of blood received in culture bottles   CULT  01/31/2018 1444    NO GROWTH 2 DAYS Performed at [facility], [place] [address] [place], [place] [address]    REPTSTATUS PENDING 01/31/2018 1444   Dg Chest Port 1v Same Day  Result Date: 02/01/2018 CLINICAL DATA:  SOB Hx of atrial fib, CHF, COPD, HTN, DM2 EXAM: PORTABLE CHEST 1 VIEW COMPARISON:  01/31/2018 FINDINGS: Status post median sternotomy. Valve replacement. Patient has a loop recorder overlying the central chest. Lungs are mildly hyperinflated. There is prominence of interstitial markings, stable over multiple prior studies. Focal opacities are identified in the RIGHT UPPER lobe, LEFT LOWER lobe, RIGHT LOWER lobe, raising the question of acute superimposed infectious process. No pulmonary edema. IMPRESSION: Cardiomegaly and interstitial lung disease. Focal opacities in the RIGHT UPPER lobe and bilateral LOWER lobes may indicate acute infectious process or asymmetric pulmonary edema. Electronically Signed   By: Nolon Nations M.D.   On: 02/01/2018 09:15   Recent Results (from the past 240 hour(s))  Blood Culture (routine x 2)     Status: Abnormal   Collection Time: 01/31/18   6:54 AM  Result Value Ref Range Status   Specimen Description BLOOD RIGHT FOREARM  Final   Special Requests   Final    BOTTLES DRAWN AEROBIC AND ANAEROBIC Blood Culture results may not be optimal due to an inadequate volume of blood received in culture bottles   Culture  Setup Time   Final    GRAM POSITIVE COCCI IN CHAINS BLOOD ANAEROBIC BOTTLE CRITICAL RESULT CALLED TO, READ BACK BY AND VERIFIED WITH: N. JOHNSTON PHARM 01/31/18 2214 BEAMJ    Culture (A)  Final    VIRIDANS STREPTOCOCCUS THE SIGNIFICANCE OF ISOLATING THIS ORGANISM FROM A SINGLE SET OF BLOOD CULTURES WHEN MULTIPLE SETS ARE DRAWN IS UNCERTAIN. PLEASE NOTIFY THE MICROBIOLOGY DEPARTMENT WITHIN ONE WEEK IF SPECIATION AND SENSITIVITIES ARE REQUIRED. Performed at [facility], [place] [address], [place], [place] [address]    Report Status 02/02/2018 FINAL  Final  Blood Culture ID Panel (Reflexed)     Status: Abnormal   Collection Time: 01/31/18  6:54 AM  Result Value Ref Range Status   Enterococcus species NOT DETECTED NOT DETECTED Final   Listeria monocytogenes NOT DETECTED NOT DETECTED Final   Staphylococcus species NOT DETECTED NOT DETECTED Final   Staphylococcus aureus (BCID) NOT DETECTED NOT DETECTED Final   Streptococcus species DETECTED (A) NOT DETECTED Final    Comment: Not Enterococcus species, Streptococcus agalactiae, Streptococcus pyogenes, or Streptococcus pneumoniae. CRITICAL RESULT CALLED TO, READ BACK BY AND VERIFIED WITH: N. JOHNSTON PHARM 01/31/18 2214 BEAMJ    Streptococcus agalactiae NOT DETECTED NOT DETECTED Final   Streptococcus pneumoniae NOT DETECTED NOT DETECTED Final   Streptococcus pyogenes NOT DETECTED NOT DETECTED Final   Acinetobacter baumannii NOT DETECTED NOT DETECTED Final   Enterobacteriaceae species NOT DETECTED NOT DETECTED Final   Enterobacter cloacae complex NOT DETECTED NOT DETECTED Final   Escherichia coli NOT DETECTED NOT DETECTED Final   Klebsiella oxytoca NOT DETECTED NOT  DETECTED Final   Klebsiella pneumoniae NOT DETECTED NOT DETECTED Final   Proteus species NOT DETECTED NOT DETECTED Final   Serratia marcescens NOT DETECTED NOT DETECTED Final   Haemophilus influenzae NOT DETECTED NOT DETECTED Final  Neisseria meningitidis NOT DETECTED NOT DETECTED Final   Pseudomonas aeruginosa NOT DETECTED NOT DETECTED Final   Candida albicans NOT DETECTED NOT DETECTED Final   Candida glabrata NOT DETECTED NOT DETECTED Final   Candida krusei NOT DETECTED NOT DETECTED Final   Candida parapsilosis NOT DETECTED NOT DETECTED Final   Candida tropicalis NOT DETECTED NOT DETECTED Final    Comment: Performed at [facility], [place] [address], [place], [place] [address]  Blood Culture (routine x 2)     Status: None (Preliminary result)   Collection Time: 01/31/18  7:39 AM  Result Value Ref Range Status   Specimen Description BLOOD LEFT WRIST  Final   Special Requests   Final    BOTTLES DRAWN AEROBIC AND ANAEROBIC Blood Culture results may not be optimal due to an inadequate volume of blood received in culture bottles   Culture   Final    NO GROWTH 2 DAYS Performed at [facility], [place] [address] [place], [place] [address]    Report Status PENDING  Incomplete  Urine culture     Status: None   Collection Time: 01/31/18  8:15 AM  Result Value Ref Range Status   Specimen Description URINE, CATHETERIZED  Final   Special Requests NONE  Final   Culture   Final    NO GROWTH Performed at [facility], [address], [place], [place] [address]    Report Status 02/01/2018 FINAL  Final  MRSA PCR Screening     Status: None   Collection Time: 01/31/18  1:59 PM  Result Value Ref Range Status   MRSA by PCR NEGATIVE NEGATIVE Final    Comment:        The GeneXpert MRSA Assay (FDA approved for NASAL specimens only), is one component of a comprehensive MRSA colonization surveillance program. It is not intended to diagnose MRSA infection nor to guide or monitor  treatment for MRSA infections. Performed at [facility], [place] [address], [place], [place] [address]   Culture, blood (x 2)     Status: None (Preliminary result)   Collection Time: 01/31/18  2:44 PM  Result Value Ref Range Status   Specimen Description BLOOD RIGHT ANTECUBITAL  Final   Special Requests   Final    BOTTLES DRAWN AEROBIC ONLY Blood Culture results may not be optimal due to an inadequate volume of blood received in culture bottles   Culture   Final    NO GROWTH 2 DAYS Performed at [facility], [place] [address], [place], [place] [address]    Report Status PENDING  Incomplete    Microbiology: Recent Results (from the past 240 hour(s))  Blood Culture (routine x 2)     Status: Abnormal   Collection Time: 01/31/18  6:54 AM  Result Value Ref Range Status   Specimen Description BLOOD RIGHT FOREARM  Final   Special Requests   Final    BOTTLES DRAWN AEROBIC AND ANAEROBIC Blood Culture results may not be optimal due to an inadequate volume of blood received in culture bottles   Culture  Setup Time   Final    GRAM POSITIVE COCCI IN CHAINS BLOOD ANAEROBIC BOTTLE CRITICAL RESULT CALLED TO, READ BACK BY AND VERIFIED WITH: N. JOHNSTON PHARM 01/31/18 2214 BEAMJ    Culture (A)  Final    VIRIDANS STREPTOCOCCUS THE SIGNIFICANCE OF ISOLATING THIS ORGANISM FROM A SINGLE SET OF BLOOD CULTURES WHEN MULTIPLE SETS ARE DRAWN IS UNCERTAIN. PLEASE NOTIFY THE  MICROBIOLOGY DEPARTMENT WITHIN ONE WEEK IF SPECIATION AND SENSITIVITIES ARE REQUIRED. Performed at [facility], [place] [address], [place], [place] [address]    Report Status 02/02/2018 FINAL  Final  Blood Culture ID Panel (Reflexed)     Status: Abnormal   Collection Time: 01/31/18  6:54 AM  Result Value Ref Range Status   Enterococcus species NOT DETECTED NOT DETECTED Final   Listeria monocytogenes NOT DETECTED NOT DETECTED Final   Staphylococcus species NOT DETECTED NOT DETECTED Final   Staphylococcus aureus (BCID) NOT  DETECTED NOT DETECTED Final   Streptococcus species DETECTED (A) NOT DETECTED Final    Comment: Not Enterococcus species, Streptococcus agalactiae, Streptococcus pyogenes, or Streptococcus pneumoniae. CRITICAL RESULT CALLED TO, READ BACK BY AND VERIFIED WITH: N. JOHNSTON PHARM 01/31/18 2214 BEAMJ    Streptococcus agalactiae NOT DETECTED NOT DETECTED Final   Streptococcus pneumoniae NOT DETECTED NOT DETECTED Final   Streptococcus pyogenes NOT DETECTED NOT DETECTED Final   Acinetobacter baumannii NOT DETECTED NOT DETECTED Final   Enterobacteriaceae species NOT DETECTED NOT DETECTED Final   Enterobacter cloacae complex NOT DETECTED NOT DETECTED Final   Escherichia coli NOT DETECTED NOT DETECTED Final   Klebsiella oxytoca NOT DETECTED NOT DETECTED Final   Klebsiella pneumoniae NOT DETECTED NOT DETECTED Final   Proteus species NOT DETECTED NOT DETECTED Final   Serratia marcescens NOT DETECTED NOT DETECTED Final   Haemophilus influenzae NOT DETECTED NOT DETECTED Final   Neisseria meningitidis NOT DETECTED NOT DETECTED Final   Pseudomonas aeruginosa NOT DETECTED NOT DETECTED Final   Candida albicans NOT DETECTED NOT DETECTED Final   Candida glabrata NOT DETECTED NOT DETECTED Final   Candida krusei NOT DETECTED NOT DETECTED Final   Candida parapsilosis NOT DETECTED NOT DETECTED Final   Candida tropicalis NOT DETECTED NOT DETECTED Final    Comment: Performed at [facility], [place] [address], [place], [place] [address]  Blood Culture (routine x 2)     Status: None (Preliminary result)   Collection Time: 01/31/18  7:39 AM  Result Value Ref Range Status   Specimen Description BLOOD LEFT WRIST  Final   Special Requests   Final    BOTTLES DRAWN AEROBIC AND ANAEROBIC Blood Culture results may not be optimal due to an inadequate volume of blood received in culture bottles   Culture   Final    NO GROWTH 2 DAYS Performed at [facility], [place] [address] [place], [place] [address]     Report Status PENDING  Incomplete  Urine culture     Status: None   Collection Time: 01/31/18  8:15 AM  Result Value Ref Range Status   Specimen Description URINE, CATHETERIZED  Final   Special Requests NONE  Final   Culture   Final    NO GROWTH Performed at [facility], [address] [place], [place] [address]    Report Status 02/01/2018 FINAL  Final  MRSA PCR Screening     Status: None   Collection Time: 01/31/18  1:59 PM  Result Value Ref Range Status   MRSA by PCR NEGATIVE NEGATIVE Final    Comment:        The GeneXpert MRSA Assay (FDA approved for NASAL specimens only), is one component of a comprehensive MRSA colonization surveillance program. It is not intended to diagnose MRSA infection nor to guide or monitor treatment for MRSA infections. Performed at [facility], [place] [address], [place], [place] [address]   Culture, blood (x  2)     Status: None (Preliminary result)   Collection Time: 01/31/18  2:44 PM  Result Value Ref Range Status   Specimen Description BLOOD RIGHT ANTECUBITAL  Final   Special Requests   Final    BOTTLES DRAWN AEROBIC ONLY Blood Culture results may not be optimal due to an inadequate volume of blood received in culture bottles   Culture   Final    NO GROWTH 2 DAYS Performed at [facility], [place] [address], [place], [place] [address]    Report Status PENDING  Incomplete    Radiographs and labs were personally reviewed by me.   Bobby Rumpf, MD [facility] [phone] 02/02/2018, 12:41 PM

## 2018-02-02 NOTE — Progress Notes (Addendum)
PROGRESS NOTE        PATIENT DETAILS Name: Angela Hines Age: 84 y.o. Sex: female Date of Birth: 06/17/1932 Admit Date: 01/31/2018 Admitting Physician Bonnell Public, MD [id number], Margaretha Sheffield, MD  Brief Narrative: Patient is a 84 y.o. female with history of atrial fibrillation on anticoagulation, COPD, chronic combined systolic/diastolic heart failure, DM-2, bioprosthetic aortic valve replacement, numerous hospitalizations this year alone-presenting with acute hypoxic respiratory failure felt to be secondary to COPD exacerbation, further evaluation revealed strep viridans bacteremia.  See below for further details   Subjective:  Looks a whole lot better-much more comfortable than yesterday.  Assessment/Plan: Acute hypoxic respiratory failure secondary to COPD exacerbation:liberated off BiPAP on 10/11 morning.  Much improved-moving air well with only scattered few rhonchi.  Continue steroids but start tapering, continue bronchodilators.  DNR in place.  Palliative care following.    Sepsis secondary to strep viridans bacteremia: Sepsis pathophysiology has rapidly resolved.  Suspicion for endocarditis given bioprosthetic aortic valve in place.  For now would continue with Rocephin-we will check a transthoracic echocardiogram-and will await arrival of family to discuss further-given her poor overall prognosis-not sure if we need to subject this frail elderly patient with a TEE.   Addendum: Discussed with patient's son/patient at bedside-it seems that family/patient does not want aggressive care including TEE which was subject pt to sedation-we will await ID evaluation-but may be just prudent to treat with long course of IV antibiotics.  Atrial fibrillation with RVR: Initially required Cardizem infusion-this was titrated off on 10/11-has been restarted on beta-blocker and amiodarone.  However rate is starting to creep up-increase beta-blockers to 50 mg p.o. every 8.   Continue Xarelto.  Continue telemetry monitoring.  Chronic combined systolic/diastolic heart failure: Compensated-volume status stable-continue diuretics.  Anemia: Anemia secondary to chronic disease-worsened by acute illness-follow for now.  If hemoglobin decreases significantly will consider transfusion.  Failure to thrive syndrome/debility: Continue PT eval-DNR in place-has had numerous hospitalizations this year-long discussion with the patient's son at bedside yesterday-may be prudent to limit care-and transition to comfort measures if she deteriorates further.  Palliative care following-we will continue to engage with family.  History of bioprosthetic AVR-with moderate aortic stenosis: See above regarding concerns of endocarditis given Streptococcus viridans bacteremia.  Severe malnutrition: Continue supplements  Palliative care: DNR in place-family does understand poor overall prognosis-palliative care following.  We will continue to engage with family-and try and define further goals of care now that she may have prosthetic valve endocarditis.  Telemetry (independently reviewed):Afib-rate in low 100's  DVT Prophylaxis: Full dose anticoagulation with Xarelto  Code Status: DNR  Family Communication: None at bedside  Disposition Plan: Remain inpatient  Antimicrobial agents: Anti-infectives (From admission, onward)   Start     Dose/Rate Route Frequency Ordered Stop   02/01/18 0800  vancomycin (VANCOCIN) IVPB 750 mg/150 ml premix  Status:  Discontinued     750 mg 150 mL/hr over 60 Minutes Intravenous Every 24 hours 01/31/18 1102 01/31/18 2251   01/31/18 2300  cefTRIAXone (ROCEPHIN) 2 g in sodium chloride 0.9 % 100 mL IVPB     2 g 200 mL/hr over 30 Minutes Intravenous Every 24 hours 01/31/18 2252     01/31/18 1600  aztreonam (AZACTAM) 1 g in sodium chloride 0.9 % 100 mL IVPB  Status:  Discontinued     1 g 200 mL/hr over 30 Minutes  Intravenous Every 8 hours 01/31/18 1102  01/31/18 2251   01/31/18 0700  aztreonam (AZACTAM) 2 g in sodium chloride 0.9 % 100 mL IVPB     2 g 200 mL/hr over 30 Minutes Intravenous  Once 01/31/18 0645 01/31/18 0826   01/31/18 0700  metroNIDAZOLE (FLAGYL) IVPB 500 mg  Status:  Discontinued     500 mg 100 mL/hr over 60 Minutes Intravenous Every 8 hours 01/31/18 0645 01/31/18 2251   01/31/18 0700  vancomycin (VANCOCIN) IVPB 1000 mg/200 mL premix     1,000 mg 200 mL/hr over 60 Minutes Intravenous  Once 01/31/18 0645 01/31/18 0834      Procedures: None  CONSULTS:  Palliative care  Time spent: 25 minutes-Greater than 50% of this time was spent in counseling, explanation of diagnosis, planning of further management, and coordination of care.  MEDICATIONS: Scheduled Meds: . amiodarone  100 mg Oral Daily  . budesonide (PULMICORT) nebulizer solution  0.25 mg Nebulization BID  . feeding supplement (ENSURE ENLIVE)  237 mL Oral BID BM  . insulin aspart  0-9 Units Subcutaneous TID WC  . ipratropium  0.5 mg Nebulization TID  . levalbuterol  0.63 mg Nebulization TID  . methylPREDNISolone (SOLU-MEDROL) injection  40 mg Intravenous Q8H  . metoprolol tartrate  25 mg Oral Q8H  . multivitamin with minerals  1 tablet Oral Daily  . Rivaroxaban  15 mg Oral Q supper   Continuous Infusions: . sodium chloride 10 mL/hr at 02/01/18 1600  . cefTRIAXone (ROCEPHIN)  IV Stopped (02/01/18 2245)   PRN Meds:.sodium chloride, albuterol, LORazepam **OR** LORazepam, metoprolol tartrate, morphine injection   PHYSICAL EXAM: Vital signs: Vitals:   02/02/18 0515 02/02/18 0603 02/02/18 0636 02/02/18 0853  BP: (!) 153/114 (!) 149/112 (!) 149/94   Pulse: (!) 125 (!) 117 (!) 114 (!) 120  Resp: (!) 24 (!) 26 (!) 24 20  Temp:  97.8 F (36.6 C)    TempSrc:  Oral    SpO2: 98% 97% 96% 93%  Weight:      Height:       Filed Weights   01/31/18 0645  Weight: 49 kg   Body mass index is 16.92 kg/m.   General appearance:Awake, alert, not in any distress.   Eyes:no scleral icterus. HEENT: Atraumatic and Normocephalic Neck: supple, no JVD. Resp:Good air entry bilaterally, few scattered rhonchi CVS: S1 S2 regular, + systolic murmur GI: Bowel sounds present, Non tender and not distended with no gaurding, rigidity or rebound. Extremities: B/L Lower Ext shows no edema, both legs are warm to touch Neurology:  Non focal Psychiatric: Normal judgment and insight. Normal mood. Musculoskeletal:No digital cyanosis Skin:No Rash, warm and dry Wounds:N/A  I have personally reviewed following labs and imaging studies  LABORATORY DATA: CBC: Recent Labs  Lab 01/31/18 0559 01/31/18 1444 02/01/18 0138 02/02/18 0306  WBC 18.1* 24.6* 18.9* 16.4*  NEUTROABS 11.2* 22.8*  --   --   HGB 7.8* 7.8* 7.6* 7.6*  HCT 28.7* 30.2* 26.9* 27.8*  MCV 84.7 86.0 81.5 80.6  PLT 455* 412* 352 782    Basic Metabolic Panel: Recent Labs  Lab 01/31/18 0559 01/31/18 1444 02/01/18 0833 02/02/18 0306  NA 136 138 138 138  K 4.2 5.0 4.0 4.3  CL 106 107 107 105  CO2 20* 18* 21* 22  GLUCOSE 330* 174* 182* 183*  BUN 15 19 18  27*  CREATININE 1.01* 0.85 0.64 0.69  CALCIUM 8.6* 8.5* 8.7* 9.1  MG  --  1.8  --   --  PHOS  --  4.9*  --   --     GFR: Estimated Creatinine Clearance: 41.2 mL/min (by C-G formula based on SCr of 0.69 mg/dL).  Liver Function Tests: Recent Labs  Lab 01/31/18 0559 01/31/18 1444  AST 81* 232*  ALT 52* 214*  ALKPHOS 108 125  BILITOT 0.4 0.6  PROT 6.1* 6.2*  ALBUMIN 3.5 3.4*   No results for input(s): LIPASE, AMYLASE in the last 168 hours. No results for input(s): AMMONIA in the last 168 hours.  Coagulation Profile: Recent Labs  Lab 01/31/18 1444  INR 1.26    Cardiac Enzymes: Recent Labs  Lab 01/31/18 1444 01/31/18 2018 02/01/18 0138  TROPONINI 0.05* 0.09* 0.12*    BNP (last 3 results) No results for input(s): PROBNP in the last 8760 hours.  HbA1C: No results for input(s): HGBA1C in the last 72  hours.  CBG: Recent Labs  Lab 02/01/18 0756 02/01/18 1145 02/01/18 1625 02/01/18 2134 02/02/18 0736  GLUCAP 172* 171* 192* 133* 184*    Lipid Profile: No results for input(s): CHOL, HDL, LDLCALC, TRIG, CHOLHDL, LDLDIRECT in the last 72 hours.  Thyroid Function Tests: No results for input(s): TSH, T4TOTAL, FREET4, T3FREE, THYROIDAB in the last 72 hours.  Anemia Panel: No results for input(s): VITAMINB12, FOLATE, FERRITIN, TIBC, IRON, RETICCTPCT in the last 72 hours.  Urine analysis:    Component Value Date/Time   COLORURINE YELLOW 01/31/2018 0645   APPEARANCEUR CLEAR 01/31/2018 0645   LABSPEC 1.018 01/31/2018 0645   PHURINE 6.0 01/31/2018 0645   GLUCOSEU 150 (A) 01/31/2018 0645   HGBUR NEGATIVE 01/31/2018 0645   BILIRUBINUR NEGATIVE 01/31/2018 0645   KETONESUR NEGATIVE 01/31/2018 0645   PROTEINUR 30 (A) 01/31/2018 0645   NITRITE NEGATIVE 01/31/2018 0645   LEUKOCYTESUR NEGATIVE 01/31/2018 0645    Sepsis Labs: Lactic Acid, Venous    Component Value Date/Time   LATICACIDVEN 3.3 ([place]) 01/31/2018 1629    MICROBIOLOGY: Recent Results (from the past 240 hour(s))  Blood Culture (routine x 2)     Status: Abnormal   Collection Time: 01/31/18  6:54 AM  Result Value Ref Range Status   Specimen Description BLOOD RIGHT FOREARM  Final   Special Requests   Final    BOTTLES DRAWN AEROBIC AND ANAEROBIC Blood Culture results may not be optimal due to an inadequate volume of blood received in culture bottles   Culture  Setup Time   Final    GRAM POSITIVE COCCI IN CHAINS BLOOD ANAEROBIC BOTTLE CRITICAL RESULT CALLED TO, READ BACK BY AND VERIFIED WITH: N. JOHNSTON PHARM 01/31/18 2214 BEAMJ    Culture (A)  Final    VIRIDANS STREPTOCOCCUS THE SIGNIFICANCE OF ISOLATING THIS ORGANISM FROM A SINGLE SET OF BLOOD CULTURES WHEN MULTIPLE SETS ARE DRAWN IS UNCERTAIN. PLEASE NOTIFY THE MICROBIOLOGY DEPARTMENT WITHIN ONE WEEK IF SPECIATION AND SENSITIVITIES ARE REQUIRED. Performed at [facility], [place] [address], [place], [place] [address]    Report Status 02/02/2018 FINAL  Final  Blood Culture ID Panel (Reflexed)     Status: Abnormal   Collection Time: 01/31/18  6:54 AM  Result Value Ref Range Status   Enterococcus species NOT DETECTED NOT DETECTED Final   Listeria monocytogenes NOT DETECTED NOT DETECTED Final   Staphylococcus species NOT DETECTED NOT DETECTED Final   Staphylococcus aureus (BCID) NOT DETECTED NOT DETECTED Final   Streptococcus species DETECTED (A) NOT DETECTED Final    Comment: Not Enterococcus species, Streptococcus agalactiae, Streptococcus pyogenes, or Streptococcus pneumoniae. CRITICAL RESULT CALLED  TO, READ BACK BY AND VERIFIED WITH: N. JOHNSTON PHARM 01/31/18 2214 BEAMJ    Streptococcus agalactiae NOT DETECTED NOT DETECTED Final   Streptococcus pneumoniae NOT DETECTED NOT DETECTED Final   Streptococcus pyogenes NOT DETECTED NOT DETECTED Final   Acinetobacter baumannii NOT DETECTED NOT DETECTED Final   Enterobacteriaceae species NOT DETECTED NOT DETECTED Final   Enterobacter cloacae complex NOT DETECTED NOT DETECTED Final   Escherichia coli NOT DETECTED NOT DETECTED Final   Klebsiella oxytoca NOT DETECTED NOT DETECTED Final   Klebsiella pneumoniae NOT DETECTED NOT DETECTED Final   Proteus species NOT DETECTED NOT DETECTED Final   Serratia marcescens NOT DETECTED NOT DETECTED Final   Haemophilus influenzae NOT DETECTED NOT DETECTED Final   Neisseria meningitidis NOT DETECTED NOT DETECTED Final   Pseudomonas aeruginosa NOT DETECTED NOT DETECTED Final   Candida albicans NOT DETECTED NOT DETECTED Final   Candida glabrata NOT DETECTED NOT DETECTED Final   Candida krusei NOT DETECTED NOT DETECTED Final   Candida parapsilosis NOT DETECTED NOT DETECTED Final   Candida tropicalis NOT DETECTED NOT DETECTED Final    Comment: Performed at [facility], [place] [address], [place], [place] [address]  Blood Culture (routine x 2)     Status: None  (Preliminary result)   Collection Time: 01/31/18  7:39 AM  Result Value Ref Range Status   Specimen Description BLOOD LEFT WRIST  Final   Special Requests   Final    BOTTLES DRAWN AEROBIC AND ANAEROBIC Blood Culture results may not be optimal due to an inadequate volume of blood received in culture bottles   Culture   Final    NO GROWTH 2 DAYS Performed at [facility], [place] [address], [place], [place] [address]    Report Status PENDING  Incomplete  Urine culture     Status: None   Collection Time: 01/31/18  8:15 AM  Result Value Ref Range Status   Specimen Description URINE, CATHETERIZED  Final   Special Requests NONE  Final   Culture   Final    NO GROWTH Performed at [facility], [address], [place], [place] [address]    Report Status 02/01/2018 FINAL  Final  MRSA PCR Screening     Status: None   Collection Time: 01/31/18  1:59 PM  Result Value Ref Range Status   MRSA by PCR NEGATIVE NEGATIVE Final    Comment:        The GeneXpert MRSA Assay (FDA approved for NASAL specimens only), is one component of a comprehensive MRSA colonization surveillance program. It is not intended to diagnose MRSA infection nor to guide or monitor treatment for MRSA infections. Performed at [facility], [place] [address], [place], [place] [address]   Culture, blood (x 2)     Status: None (Preliminary result)   Collection Time: 01/31/18  2:44 PM  Result Value Ref Range Status   Specimen Description BLOOD RIGHT ANTECUBITAL  Final   Special Requests   Final    BOTTLES DRAWN AEROBIC ONLY Blood Culture results may not be optimal due to an inadequate volume of blood received in culture bottles   Culture   Final    NO GROWTH 2 DAYS Performed at [facility], [place] [address], [place], [place] [address]    Report Status PENDING  Incomplete    RADIOLOGY STUDIES/RESULTS: Dg Chest Port 1 View  Result Date: 01/31/2018 CLINICAL DATA:  Shortness of breath tonight. EXAM:  PORTABLE CHEST  1 VIEW COMPARISON:  12/17/2017 FINDINGS: Postoperative changes in the mediastinum. Cardiac valve prosthesis. Mild cardiac enlargement. No pulmonary vascular congestion. Emphysematous changes are suggested in the lungs with perihilar streaky opacities and interstitial infiltrates in the lung bases suggesting chronic bronchitis and fibrosis. No focal consolidation. No blunting of costophrenic angles. No pneumothorax. Mediastinal contours appear intact. IMPRESSION: Emphysematous and chronic bronchitic changes in the lungs. No focal consolidation. Electronically Signed   By: Lucienne Capers M.D.   On: 01/31/2018 06:19   Dg Chest Port 1v Same Day  Result Date: 02/01/2018 CLINICAL DATA:  SOB Hx of atrial fib, CHF, COPD, HTN, DM2 EXAM: PORTABLE CHEST 1 VIEW COMPARISON:  01/31/2018 FINDINGS: Status post median sternotomy. Valve replacement. Patient has a loop recorder overlying the central chest. Lungs are mildly hyperinflated. There is prominence of interstitial markings, stable over multiple prior studies. Focal opacities are identified in the RIGHT UPPER lobe, LEFT LOWER lobe, RIGHT LOWER lobe, raising the question of acute superimposed infectious process. No pulmonary edema. IMPRESSION: Cardiomegaly and interstitial lung disease. Focal opacities in the RIGHT UPPER lobe and bilateral LOWER lobes may indicate acute infectious process or asymmetric pulmonary edema. Electronically Signed   By: Nolon Nations M.D.   On: 02/01/2018 09:15     LOS: 2 days   Oren Binet, MD  [facility]  If 7PM-7AM, please contact night-coverage  Please page via [email or web address]-Password [email or web address]-click on MD name and type text message  02/02/2018, 11:44 AM

## 2018-02-02 NOTE — Evaluation (Signed)
Physical Therapy Evaluation Patient Details Name: Angela Hines MRN: [id number] DOB: Sep 22, 1932 Today's Date: 02/02/2018   History of Present Illness  Pt adm with acute hypoxic respiratory failure thought to be due to COPD exacerbation. Pt found to have strep viridans bacteremia. PMH - atrial fibrillation on anticoagulation, COPD, chronic combined systolic/diastolic heart failure, DM-2, bioprosthetic aortic valve replacement.  Clinical Impression  Pt presents to PT with limited functional activity tolerance which compromises her ability to manage on her own or with intermittent assist at [facility]. Pt was fatigued very quickly and had to sit and returned to room pushed on rollator. Pt also with frequent falls (7-9) in last 6-8 months. Patient and family were to meet with Palliative today as well. Family was arranging for some help to come into [facility] on intermittent basis. Will depend on progress of patient and assist set up at dc if pt able to return to [facility].    Follow Up Recommendations SNF(unless improvement in activity tolerance or more assist )    Equipment Recommendations  None recommended by PT    Recommendations for Other Services       Precautions / Restrictions Precautions Precautions: Fall      Mobility  Bed Mobility Overal bed mobility: Needs Assistance Bed Mobility: Supine to Sit     Supine to sit: Supervision;HOB elevated     General bed mobility comments: Incr time  Transfers Overall transfer level: Needs assistance Equipment used: 4-wheeled walker Transfers: Sit to/from Stand Sit to Stand: Min guard            Ambulation/Gait Ambulation/Gait assistance: Min guard Gait Distance (Feet): 60 Feet Assistive device: 4-wheeled walker Gait Pattern/deviations: Step-through pattern;Decreased stride length Gait velocity: decr Gait velocity interpretation: 1.31 - 2.62 ft/sec, indicative of limited community ambulator General Gait Details: Assist  for safety. Pt fatigued very quickly and had to sit on rollator and ride to room  Stairs            Wheelchair Mobility    Modified Rankin (Stroke Patients Only)       Balance Overall balance assessment: Needs assistance Sitting-balance support: No upper extremity supported;Feet supported Sitting balance-Leahy Scale: Good     Standing balance support: No upper extremity supported Standing balance-Leahy Scale: Fair                               Pertinent Vitals/Pain Pain Assessment: No/denies pain    Home Living Family/patient expects to be discharged to:: Other (Comment)(Independent living at [facility]) Living Arrangements: Alone Available Help at Discharge: Personal care attendant;Available PRN/intermittently Type of Home: Independent living facility Home Access: [place]: One level Home Equipment: Grab bars - tub/shower;Grab bars - toilet;Tub bench;Walker - 4 wheels      Prior Function Level of Independence: Independent with assistive device(s)         Comments: uses rollator at times. Pt with 8-10 falls over last 6-8 months     Hand Dominance   Dominant Hand: Right    Extremity/Trunk Assessment   Upper Extremity Assessment Upper Extremity Assessment: Defer to OT evaluation    Lower Extremity Assessment Lower Extremity Assessment: Generalized weakness       Communication   Communication: No difficulties  Cognition Arousal/Alertness: Awake/alert Behavior During Therapy: WFL for tasks assessed/performed Overall Cognitive Status: Within Functional Limits for tasks assessed  General Comments      Exercises     Assessment/Plan    PT Assessment Patient needs continued PT services  PT Problem List Decreased strength;Decreased activity tolerance;Decreased balance;Decreased mobility       PT Treatment Interventions DME instruction;Gait training;Functional  mobility training;Therapeutic activities;Therapeutic exercise;Balance training;Patient/family education    PT Goals (Current goals can be found in the Care Plan section)  Acute Rehab PT Goals Patient Stated Goal: return to [facility] PT Goal Formulation: With patient Time For Goal Achievement: 02/16/18 Potential to Achieve Goals: Fair    Frequency Min 3X/week   Barriers to discharge Decreased caregiver support currently doesn't have 24 hour assist    Co-evaluation               AM-PAC PT "6 Clicks" Daily Activity  Outcome Measure Difficulty turning over in bed (including adjusting bedclothes, sheets and blankets)?: A Little Difficulty moving from lying on back to sitting on the side of the bed? : A Little Difficulty sitting down on and standing up from a chair with arms (e.g., wheelchair, bedside commode, etc,.)?: A Little Help needed moving to and from a bed to chair (including a wheelchair)?: A Little Help needed walking in hospital room?: A Little Help needed climbing 3-5 steps with a railing? : A Lot 6 Click Score: 17    End of Session   Activity Tolerance: Patient limited by fatigue Patient left: in chair;with call bell/phone within reach;with chair alarm set;with family/visitor present Nurse Communication: Mobility status PT Visit Diagnosis: Other abnormalities of gait and mobility (R26.89);Muscle weakness (generalized) (M62.81);Repeated falls (R29.6)    Time: 9024-0973 PT Time Calculation (min) (ACUTE ONLY): 27 min   Charges:   PT Evaluation $PT Eval Moderate Complexity: 1 Mod PT Treatments $Gait Training: 8-22 mins        [place] Pager [phone] [place] 02/02/2018, 5:15 PM

## 2018-02-02 NOTE — Progress Notes (Signed)
Daily Progress Note   Patient Name: Angela Hines       Date: 02/02/2018 DOB: June 10, 1932  Age: 84 y.o. MRN#: [id number] Attending Physician: Jonetta Osgood, MD Primary Care Physician: Kelton Pillar, MD Admit Date: 01/31/2018  Reason for Consultation/Follow-up: Establishing goals of care  Subjective: Met with patient's three sons and had extensive [place] discussion. Discussed ongoing aggressive medical care including IV antibiotics, return to hospital if needed, rehab etc, discussed option of transition to comfort measures only, discussed time trial of current care.  Patient today is awake and alert. Eating well. States she feels much better. Care was also discussed with her. She states for now she wishes to continue antibiotic treatment but if its "her time to go" then she is ready.  Decisions made at end of conversation are to continue current care without escalation. We discussed that prolonged hospitalization (which patient will require to complete 10 day course of IV antibiotics), as well as 6 weeks of po antibiotics comes with its own risks of further decline, delirium, pneumonia, deconditioning, chronic diarrhea. All are in agreement that if patient declines or does not tolerate treatment she would prefer to transition to full comfort measures.  We discussed advanced care planning after discharge- family feels patient would best be served by Hospice outpatient. Would prefer comfort measures to returning to hospital.  Review of Systems  Constitutional: Positive for malaise/fatigue and weight loss.  Psychiatric/Behavioral: Positive for memory loss. The patient is nervous/anxious.     Length of Stay: 2  Current Medications: Scheduled Meds:  . amiodarone  100 mg Oral Daily  .  budesonide (PULMICORT) nebulizer solution  0.25 mg Nebulization BID  . feeding supplement (ENSURE ENLIVE)  237 mL Oral BID BM  . furosemide  20 mg Oral Daily  . insulin aspart  0-9 Units Subcutaneous TID WC  . ipratropium  0.5 mg Nebulization TID  . levalbuterol  0.63 mg Nebulization TID  . methylPREDNISolone (SOLU-MEDROL) injection  40 mg Intravenous Q8H  . metoprolol tartrate  50 mg Oral Q8H  . multivitamin with minerals  1 tablet Oral Daily  . Rivaroxaban  15 mg Oral Q supper    Continuous Infusions: . sodium chloride 10 mL/hr at 02/01/18 1600  . cefTRIAXone (ROCEPHIN)  IV Stopped (02/01/18 2245)    PRN Meds: sodium chloride, albuterol,  LORazepam **OR** LORazepam, metoprolol tartrate, morphine injection  Physical Exam  Constitutional: She is oriented to person, place, and time.  frail  Neurological: She is alert and oriented to person, place, and time.  Skin: There is pallor.  Psychiatric: She has a normal mood and affect. Her behavior is normal.  Nursing note and vitals reviewed.           Vital Signs: BP 126/90 (BP Location: Left Arm)   Pulse (!) 108   Temp 98.7 F (37.1 C) (Oral)   Resp 18   Ht _0  (1.702 m)   Wt 49 kg   SpO2 91%   BMI 16.92 kg/m  SpO2: SpO2: 91 % O2 Device: O2 Device: Room Air O2 Flow Rate: O2 Flow Rate (L/min): 3 L/min  Intake/output summary:   Intake/Output Summary (Last 24 hours) at 02/02/2018 1702 Last data filed at 02/02/2018 1500 Gross per 24 hour  Intake 619.43 ml  Output 1050 ml  Net -430.57 ml   LBM: Last BM Date: 01/30/18 Baseline Weight: Weight: 49 kg Most recent weight: Weight: 49 kg       Palliative Assessment/Data: PPS: 40%     Patient Active Problem List   Diagnosis Date Noted  . Respiratory distress   . Goals of care, counseling/discussion   . Palliative care encounter   . Sepsis ([place]) 01/31/2018  . Diabetes mellitus without complication ([place]) 80/06/4915  . Depression with anxiety 12/17/2017  . UTI  (urinary tract infection) 12/17/2017  . Hypokalemia 12/17/2017  . Intussusception ([place])   . Falls frequently 12/14/2017  . Paracetamol (acetaminophen) overdose, accidental or unintentional, initial encounter 11/21/2017  . Nausea & vomiting 09/10/2017  . Chest pain 09/10/2017  . High anion gap metabolic acidosis 91/50/5697  . Chronic systolic CHF (congestive heart failure) ([place]) 09/10/2017  . Protein-calorie malnutrition, severe 06/11/2017  . Prolonged QT interval   . COPD (chronic obstructive pulmonary disease) ([place]) 07/17/2016  . Diabetes mellitus ([place]) 07/17/2016  . Gastroesophageal reflux disease without esophagitis 07/17/2016  . Generalized anxiety disorder 07/17/2016  . Hypercholesterolemia 07/17/2016  . Hypertension 07/17/2016  . Status post placement of implantable loop recorder 07/17/2016  . AF (paroxysmal atrial fibrillation) ([place]) 07/17/2016  . Primary insomnia 07/17/2016  . S/P AVR (aortic valve replacement) 07/17/2016  . History of syncope 08/07/2014    Palliative Care Assessment & Plan   Patient Profile: 84 y.o. female  with past medical history of COPD, CHF (EF 40-45%), paroxysmal atrial fibrillation (on Xarelto), diabetes mellitus, hypertension, hyperlipidemia, aortic stenosis status post bioprosthetic aortic valve replacement admitted on 01/31/2018 with SOB and wheezing requiring BiPAP initially with COPD exacerbation and possible sepsis bacteremia. Found to have viridans strep bacteremia. Infectious disease consult recommending 10 days ceftriaxone followed by 6 weeks po. Palliative medicine consulted for [place].   Assessment/Recommendations/Plan   Continue current plan of care with IV antibiotics  Continue comfort meds including morphine and lorazepam  PMT will continue to monitor for trajection and discuss GOC with patient and family  Referral for Hospice to follow at discharge  Goals of Care and Additional Recommendations:  Limitations on Scope of Treatment:  Avoid Hospitalization, Minimize Medications and No Surgical Procedures  Code Status:  DNR  Prognosis:   < 6 months due to COPD, CHF, severe aortic stenosis with bioprosthetic heart valve, noted decline in last six months in nutrition, function, and cognition, requiring more assistance, frequent hospitalizations, losing weight  Discharge Planning:  Home with Hospice  Care plan was discussed with patient's family and  patient.  Thank you for allowing the Palliative Medicine Team to assist in the care of this patient.   Time In: 1500 Time Out: 1700 Total Time 120 minutes Prolonged Time Billed yes      Greater than 50%  of this time was spent counseling and coordinating care related to the above assessment and plan.  Mariana Kaufman, AGNP-C Palliative Medicine   Please contact Palliative Medicine Team phone at [phone] for questions and concerns.

## 2018-02-03 ENCOUNTER — Inpatient Hospital Stay (HOSPITAL_COMMUNITY): Payer: Medicare Other

## 2018-02-03 ENCOUNTER — Inpatient Hospital Stay: Payer: Self-pay

## 2018-02-03 DIAGNOSIS — Z885 Allergy status to narcotic agent status: Secondary | ICD-10-CM

## 2018-02-03 DIAGNOSIS — Z888 Allergy status to other drugs, medicaments and biological substances status: Secondary | ICD-10-CM

## 2018-02-03 DIAGNOSIS — Z881 Allergy status to other antibiotic agents status: Secondary | ICD-10-CM

## 2018-02-03 DIAGNOSIS — R0602 Shortness of breath: Secondary | ICD-10-CM

## 2018-02-03 DIAGNOSIS — B955 Unspecified streptococcus as the cause of diseases classified elsewhere: Secondary | ICD-10-CM

## 2018-02-03 DIAGNOSIS — R7881 Bacteremia: Secondary | ICD-10-CM

## 2018-02-03 LAB — RETICULOCYTES
IMMATURE RETIC FRACT: 41 % — AB (ref 2.3–15.9)
RBC.: 3.29 MIL/uL — ABNORMAL LOW (ref 3.87–5.11)
Retic Count, Absolute: 63.8 10*3/uL (ref 19.0–186.0)
Retic Ct Pct: 1.9 % (ref 0.4–3.1)

## 2018-02-03 LAB — BASIC METABOLIC PANEL
ANION GAP: 12 (ref 5–15)
BUN: 38 mg/dL — AB (ref 8–23)
CHLORIDE: 104 mmol/L (ref 98–111)
CO2: 22 mmol/L (ref 22–32)
Calcium: 8.9 mg/dL (ref 8.9–10.3)
Creatinine, Ser: 0.87 mg/dL (ref 0.44–1.00)
GFR, EST NON AFRICAN AMERICAN: 60 mL/min — AB (ref >60)
Glucose, Bld: 160 mg/dL — ABNORMAL HIGH (ref 70–99)
POTASSIUM: 4 mmol/L (ref 3.5–5.1)
SODIUM: 138 mmol/L (ref 135–145)

## 2018-02-03 LAB — ABO/RH: ABO/RH(D): O POS

## 2018-02-03 LAB — CBC
HCT: 25.6 % — ABNORMAL LOW (ref 36.0–46.0)
Hemoglobin: 7.2 g/dL — ABNORMAL LOW (ref 12.0–15.0)
MCH: 22.4 pg — AB (ref 26.0–34.0)
MCHC: 28.1 g/dL — ABNORMAL LOW (ref 30.0–36.0)
MCV: 79.8 fL — ABNORMAL LOW (ref 80.0–100.0)
NRBC: 0.6 % — AB (ref 0.0–0.2)
PLATELETS: 314 10*3/uL (ref 150–400)
RBC: 3.21 MIL/uL — AB (ref 3.87–5.11)
RDW: 17.6 % — ABNORMAL HIGH (ref 11.5–15.5)
WBC: 14.1 10*3/uL — ABNORMAL HIGH (ref 4.0–10.5)

## 2018-02-03 LAB — ECHOCARDIOGRAM COMPLETE
Height: 67 in
Weight: 1728.41 oz

## 2018-02-03 LAB — GLUCOSE, CAPILLARY
Glucose-Capillary: 149 mg/dL — ABNORMAL HIGH (ref 70–99)
Glucose-Capillary: 210 mg/dL — ABNORMAL HIGH (ref 70–99)
Glucose-Capillary: 263 mg/dL — ABNORMAL HIGH (ref 70–99)
Glucose-Capillary: 163 mg/dL — ABNORMAL HIGH (ref 70–99)

## 2018-02-03 LAB — MAGNESIUM: MAGNESIUM: 2.2 mg/dL (ref 1.7–2.4)

## 2018-02-03 LAB — IRON AND TIBC
IRON: 14 ug/dL — AB (ref 28–170)
Saturation Ratios: 3 % — ABNORMAL LOW (ref 10.4–31.8)
TIBC: 455 ug/dL — AB (ref 250–450)
UIBC: 441 ug/dL

## 2018-02-03 LAB — VITAMIN B12: VITAMIN B 12: 1955 pg/mL — AB (ref 180–914)

## 2018-02-03 LAB — FERRITIN: FERRITIN: 39 ng/mL (ref 11–307)

## 2018-02-03 LAB — FOLATE: FOLATE: 22.4 ng/mL (ref >5.9)

## 2018-02-03 MED ORDER — IPRATROPIUM BROMIDE 0.02 % IN SOLN
0.5000 mg | Freq: Two times a day (BID) | RESPIRATORY_TRACT | Status: DC
  Administered 2018-02-03 – 2018-02-05 (×4): 0.5 mg via RESPIRATORY_TRACT
  Filled 2018-02-03 (×4): qty 2.5

## 2018-02-03 MED ORDER — BISACODYL 5 MG PO TBEC: 5.0000 mg | DELAYED_RELEASE_TABLET | Freq: Every day | ORAL | Status: DC | PRN

## 2018-02-03 MED ORDER — METHYLPREDNISOLONE SODIUM SUCC 40 MG IJ SOLR
40.0000 mg | Freq: Every day | INTRAMUSCULAR | Status: DC
  Administered 2018-02-04 – 2018-02-05 (×2): 40 mg via INTRAVENOUS
  Filled 2018-02-03 (×3): qty 1

## 2018-02-03 MED ORDER — LEVALBUTEROL HCL 0.63 MG/3ML IN NEBU
0.6300 mg | INHALATION_SOLUTION | Freq: Two times a day (BID) | RESPIRATORY_TRACT | Status: DC
  Administered 2018-02-03 – 2018-02-05 (×4): 0.63 mg via RESPIRATORY_TRACT
  Filled 2018-02-03 (×4): qty 3

## 2018-02-03 NOTE — Progress Notes (Signed)
  Echocardiogram 2D Echocardiogram has been performed.  Angela Hines 02/03/2018, 11:13 AM

## 2018-02-03 NOTE — Progress Notes (Signed)
Pt's family states they are not sure if pt should get a PICC line. They are concerned pt needs a skilled facility. Family educated to speak with doctor in am. Family states they are aware of that and planned to speak with MD.

## 2018-02-03 NOTE — Progress Notes (Signed)
PROGRESS NOTE        PATIENT DETAILS Name: Angela Hines Age: 84 y.o. Sex: female Date of Birth: [birth year] Admit Date: 01/31/2018 Admitting Physician Bonnell Public, MD [id number], Margaretha Sheffield, MD  Brief Narrative: Patient is a 84 y.o. female with history of atrial fibrillation on anticoagulation, COPD, chronic combined systolic/diastolic heart failure, DM-2, bioprosthetic aortic valve replacement, numerous hospitalizations this year alone-presenting with acute hypoxic respiratory failure felt to be secondary to COPD exacerbation, further evaluation revealed strep viridans bacteremia.  See below for further details   Subjective:  Patient sitting in recliner, in no distress, denies any headache chest or abdominal pain.  Pleasantly confused but denies any subjective complaints.  Assessment/Plan:  Acute hypoxic respiratory failure secondary to COPD exacerbation: liberated off BiPAP on 10/11 morning.  Much improved-moving air well with only scattered few rhonchi.  Continue to taper IV steroids, continue supportive care with oxygen and nebulizer treatments as needed, currently on room air and in no distress.    Sepsis secondary to strep viridans bacteremia: Sepsis pathophysiology has rapidly resolved.  Suspicion for endocarditis given bioprosthetic aortic valve in place.  In by ID, awaiting transthoracic echocardiogram, TEE not done due to her advanced age, plan is at least 10 days of IV Rocephin will place PICC line on 02/05/2018 provided repeat surveillance cultures from 10/13 remain negative, after 10 days of IV Rocephin transition to oral antibiotics for another 6 weeks and monitor.   Addendum: Discussed with patient's son/patient at bedside-it seems that family/patient does not want aggressive care including TEE which was subject pt to sedation-we will await ID evaluation-but may be just prudent to treat with long course of IV antibiotics.  Atrial fibrillation  with RVR: Initially required Cardizem infusion-this was titrated off on 10/11-has been restarted on beta-blocker and amiodarone.  However rate is starting to creep up-increase beta-blockers to 50 mg p.o. every 8.  Continue Xarelto.  Continue telemetry monitoring.  Chronic combined systolic/diastolic heart failure: Compensated-volume status stable-continue diuretics.  Anemia: Anemia secondary to chronic disease-worsened by acute illness-follow for now.  If hemoglobin decreases significantly will consider transfusion.  Failure to thrive syndrome/debility: Continue PT eval-DNR in place-has had numerous hospitalizations this year-long discussion with the patient's son at bedside yesterday-may be prudent to limit care-and transition to comfort measures if she deteriorates further.  Palliative care following-we will continue to engage with family.  History of bioprosthetic AVR-with moderate aortic stenosis: See above regarding concerns of endocarditis given Streptococcus viridans bacteremia.  Severe malnutrition: Continue supplements  Palliative care: DNR in place-family does understand poor overall prognosis-palliative care following.  We will continue to engage with family-and try and define further goals of care now that she may have prosthetic valve endocarditis.    Telemetry (independently reviewed): Med  DVT Prophylaxis: Full dose anticoagulation with Xarelto  Code Status: DNR  Family Communication: Son in detail over the phone on 02/03/2018, he was explained that patient need not stay in the hospital for 10 days of IV antibiotics, most likely she will be discharged with IV antibiotics for 7 to 10 days thereafter she can be transitioned to hospice either at home or at [facility].  Disposition Plan: Remain inpatient  Antimicrobial agents: Anti-infectives (From admission, onward)   Start     Dose/Rate Route Frequency Ordered Stop   02/01/18 0800  vancomycin (VANCOCIN) IVPB 750 mg/150 ml  premix  Status:  Discontinued     750 mg 150 mL/hr over 60 Minutes Intravenous Every 24 hours 01/31/18 1102 01/31/18 2251   01/31/18 2300  cefTRIAXone (ROCEPHIN) 2 g in sodium chloride 0.9 % 100 mL IVPB     2 g 200 mL/hr over 30 Minutes Intravenous Every 24 hours 01/31/18 2252     01/31/18 1600  aztreonam (AZACTAM) 1 g in sodium chloride 0.9 % 100 mL IVPB  Status:  Discontinued     1 g 200 mL/hr over 30 Minutes Intravenous Every 8 hours 01/31/18 1102 01/31/18 2251   01/31/18 0700  aztreonam (AZACTAM) 2 g in sodium chloride 0.9 % 100 mL IVPB     2 g 200 mL/hr over 30 Minutes Intravenous  Once 01/31/18 0645 01/31/18 0826   01/31/18 0700  metroNIDAZOLE (FLAGYL) IVPB 500 mg  Status:  Discontinued     500 mg 100 mL/hr over 60 Minutes Intravenous Every 8 hours 01/31/18 0645 01/31/18 2251   01/31/18 0700  vancomycin (VANCOCIN) IVPB 1000 mg/200 mL premix     1,000 mg 200 mL/hr over 60 Minutes Intravenous  Once 01/31/18 0645 01/31/18 1219      Procedures: Transthoracic echocardiogram  CONSULTS:  Palliative care, ID  Time spent: 25 minutes-Greater than 50% of this time was spent in counseling, explanation of diagnosis, planning of further management, and coordination of care.  MEDICATIONS: Scheduled Meds: . amiodarone  100 mg Oral Daily  . budesonide (PULMICORT) nebulizer solution  0.25 mg Nebulization BID  . feeding supplement (ENSURE ENLIVE)  237 mL Oral BID BM  . furosemide  20 mg Oral Daily  . insulin aspart  0-9 Units Subcutaneous TID WC  . ipratropium  0.5 mg Nebulization TID  . levalbuterol  0.63 mg Nebulization TID  . [START ON 02/04/2018] methylPREDNISolone (SOLU-MEDROL) injection  40 mg Intravenous Daily  . metoprolol tartrate  50 mg Oral Q8H  . multivitamin with minerals  1 tablet Oral Daily  . Rivaroxaban  15 mg Oral Q supper   Continuous Infusions: . sodium chloride 10 mL/hr at 02/01/18 1600  . cefTRIAXone (ROCEPHIN)  IV Stopped (02/03/18 0701)   PRN Meds:.sodium  chloride, albuterol, LORazepam **OR** LORazepam, metoprolol tartrate, morphine injection   PHYSICAL EXAM: Vital signs: Vitals:   02/02/18 2108 02/03/18 0501 02/03/18 0754 02/03/18 0755  BP: (!) 133/91 134/90    Pulse: (!) 115 (!) 120 96   Resp: 16 16 20    Temp: 97.9 F (36.6 C) 97.9 F (36.6 C)    TempSrc: Oral Oral    SpO2: 96% 93% 94% 94%  Weight:      Height:       Filed Weights   01/31/18 0645  Weight: 49 kg   Body mass index is 16.92 kg/m.   Exam  Awake, pleasantly confused, No new F.N deficits, Normal affect [place].AT,PERRAL Supple Neck,No JVD, No cervical lymphadenopathy appriciated.  Symmetrical Chest wall movement, Good air movement bilaterally, CTAB RRR,No Gallops, Rubs, positive systolic murmur, No Parasternal Heave +ve B.Sounds, Abd Soft, No tenderness, No organomegaly appriciated, No rebound - guarding or rigidity. No Cyanosis, Clubbing or edema, No new Rash or bruise    I have personally reviewed following labs and imaging studies  LABORATORY DATA: CBC: Recent Labs  Lab 01/31/18 0559 01/31/18 1444 02/01/18 0138 02/02/18 0306 02/03/18 0457  WBC 18.1* 24.6* 18.9* 16.4* 14.1*  NEUTROABS 11.2* 22.8*  --   --   --   HGB 7.8* 7.8* 7.6* 7.6* 7.2*  HCT 28.7* 30.2* 26.9*  27.8* 25.6*  MCV 84.7 86.0 81.5 80.6 79.8*  PLT 455* 412* 352 363 154    Basic Metabolic Panel: Recent Labs  Lab 01/31/18 0559 01/31/18 1444 02/01/18 0833 02/02/18 0306 02/03/18 0457  NA 136 138 138 138 138  K 4.2 5.0 4.0 4.3 4.0  CL 106 107 107 105 104  CO2 20* 18* 21* 22 22  GLUCOSE 330* 174* 182* 183* 160*  BUN 15 19 18  27* 38*  CREATININE 1.01* 0.85 0.64 0.69 0.87  CALCIUM 8.6* 8.5* 8.7* 9.1 8.9  MG  --  1.8  --   --   --   PHOS  --  4.9*  --   --   --     GFR: Estimated Creatinine Clearance: 37.9 mL/min (by C-G formula based on SCr of 0.87 mg/dL).  Liver Function Tests: Recent Labs  Lab 01/31/18 0559 01/31/18 1444  AST 81* 232*  ALT 52* 214*  ALKPHOS 108 125    BILITOT 0.4 0.6  PROT 6.1* 6.2*  ALBUMIN 3.5 3.4*   No results for input(s): LIPASE, AMYLASE in the last 168 hours. No results for input(s): AMMONIA in the last 168 hours.  Coagulation Profile: Recent Labs  Lab 01/31/18 1444  INR 1.26    Cardiac Enzymes: Recent Labs  Lab 01/31/18 1444 01/31/18 2018 02/01/18 0138  TROPONINI 0.05* 0.09* 0.12*    BNP (last 3 results) No results for input(s): PROBNP in the last 8760 hours.  HbA1C: No results for input(s): HGBA1C in the last 72 hours.  CBG: Recent Labs  Lab 02/02/18 0736 02/02/18 1151 02/02/18 1644 02/02/18 2121 02/03/18 0733  GLUCAP 184* 158* 144* 148* 149*    Lipid Profile: No results for input(s): CHOL, HDL, LDLCALC, TRIG, CHOLHDL, LDLDIRECT in the last 72 hours.  Thyroid Function Tests: No results for input(s): TSH, T4TOTAL, FREET4, T3FREE, THYROIDAB in the last 72 hours.  Anemia Panel: No results for input(s): VITAMINB12, FOLATE, FERRITIN, TIBC, IRON, RETICCTPCT in the last 72 hours.  Urine analysis:    Component Value Date/Time   COLORURINE YELLOW 01/31/2018 0645   APPEARANCEUR CLEAR 01/31/2018 0645   LABSPEC 1.018 01/31/2018 0645   PHURINE 6.0 01/31/2018 0645   GLUCOSEU 150 (A) 01/31/2018 0645   HGBUR NEGATIVE 01/31/2018 0645   BILIRUBINUR NEGATIVE 01/31/2018 0645   KETONESUR NEGATIVE 01/31/2018 0645   PROTEINUR 30 (A) 01/31/2018 0645   NITRITE NEGATIVE 01/31/2018 0645   LEUKOCYTESUR NEGATIVE 01/31/2018 0645    Sepsis Labs: Lactic Acid, Venous    Component Value Date/Time   LATICACIDVEN 3.3 ([place]) 01/31/2018 1629    MICROBIOLOGY: Recent Results (from the past 240 hour(s))  Blood Culture (routine x 2)     Status: Abnormal   Collection Time: 01/31/18  6:54 AM  Result Value Ref Range Status   Specimen Description BLOOD RIGHT FOREARM  Final   Special Requests   Final    BOTTLES DRAWN AEROBIC AND ANAEROBIC Blood Culture results may not be optimal due to an inadequate volume of blood received  in culture bottles   Culture  Setup Time   Final    GRAM POSITIVE COCCI IN CHAINS BLOOD ANAEROBIC BOTTLE CRITICAL RESULT CALLED TO, READ BACK BY AND VERIFIED WITH: N. JOHNSTON PHARM 01/31/18 2214 BEAMJ    Culture (A)  Final    VIRIDANS STREPTOCOCCUS THE SIGNIFICANCE OF ISOLATING THIS ORGANISM FROM A SINGLE SET OF BLOOD CULTURES WHEN MULTIPLE SETS ARE DRAWN IS UNCERTAIN. PLEASE NOTIFY THE MICROBIOLOGY DEPARTMENT WITHIN ONE WEEK IF SPECIATION AND SENSITIVITIES ARE  REQUIRED. Performed at [facility], [place] [address], [place], [place] [address]    Report Status 02/02/2018 FINAL  Final  Blood Culture ID Panel (Reflexed)     Status: Abnormal   Collection Time: 01/31/18  6:54 AM  Result Value Ref Range Status   Enterococcus species NOT DETECTED NOT DETECTED Final   Listeria monocytogenes NOT DETECTED NOT DETECTED Final   Staphylococcus species NOT DETECTED NOT DETECTED Final   Staphylococcus aureus (BCID) NOT DETECTED NOT DETECTED Final   Streptococcus species DETECTED (A) NOT DETECTED Final    Comment: Not Enterococcus species, Streptococcus agalactiae, Streptococcus pyogenes, or Streptococcus pneumoniae. CRITICAL RESULT CALLED TO, READ BACK BY AND VERIFIED WITH: N. JOHNSTON PHARM 01/31/18 2214 BEAMJ    Streptococcus agalactiae NOT DETECTED NOT DETECTED Final   Streptococcus pneumoniae NOT DETECTED NOT DETECTED Final   Streptococcus pyogenes NOT DETECTED NOT DETECTED Final   Acinetobacter baumannii NOT DETECTED NOT DETECTED Final   Enterobacteriaceae species NOT DETECTED NOT DETECTED Final   Enterobacter cloacae complex NOT DETECTED NOT DETECTED Final   Escherichia coli NOT DETECTED NOT DETECTED Final   Klebsiella oxytoca NOT DETECTED NOT DETECTED Final   Klebsiella pneumoniae NOT DETECTED NOT DETECTED Final   Proteus species NOT DETECTED NOT DETECTED Final   Serratia marcescens NOT DETECTED NOT DETECTED Final   Haemophilus influenzae NOT DETECTED NOT DETECTED Final   Neisseria  meningitidis NOT DETECTED NOT DETECTED Final   Pseudomonas aeruginosa NOT DETECTED NOT DETECTED Final   Candida albicans NOT DETECTED NOT DETECTED Final   Candida glabrata NOT DETECTED NOT DETECTED Final   Candida krusei NOT DETECTED NOT DETECTED Final   Candida parapsilosis NOT DETECTED NOT DETECTED Final   Candida tropicalis NOT DETECTED NOT DETECTED Final    Comment: Performed at [facility], [place] [address], [place], [place] [address]  Blood Culture (routine x 2)     Status: None (Preliminary result)   Collection Time: 01/31/18  7:39 AM  Result Value Ref Range Status   Specimen Description BLOOD LEFT WRIST  Final   Special Requests   Final    BOTTLES DRAWN AEROBIC AND ANAEROBIC Blood Culture results may not be optimal due to an inadequate volume of blood received in culture bottles   Culture   Final    NO GROWTH 3 DAYS Performed at [facility], [place] [address], [place], [place] [address]    Report Status PENDING  Incomplete  Urine culture     Status: None   Collection Time: 01/31/18  8:15 AM  Result Value Ref Range Status   Specimen Description URINE, CATHETERIZED  Final   Special Requests NONE  Final   Culture   Final    NO GROWTH Performed at [facility], [address], [place], [place] [address]    Report Status 02/01/2018 FINAL  Final  MRSA PCR Screening     Status: None   Collection Time: 01/31/18  1:59 PM  Result Value Ref Range Status   MRSA by PCR NEGATIVE NEGATIVE Final    Comment:        The GeneXpert MRSA Assay (FDA approved for NASAL specimens only), is one component of a comprehensive MRSA colonization surveillance program. It is not intended to diagnose MRSA infection nor to guide or monitor treatment for MRSA infections. Performed at [facility], [place] [address], [place], [place] [address]   Culture, blood (x 2)     Status: None (Preliminary result)  Collection Time: 01/31/18  2:44 PM  Result Value Ref Range Status    Specimen Description BLOOD RIGHT ANTECUBITAL  Final   Special Requests   Final    BOTTLES DRAWN AEROBIC ONLY Blood Culture results may not be optimal due to an inadequate volume of blood received in culture bottles   Culture   Final    NO GROWTH 3 DAYS Performed at [facility], [place] [address] [place], [place] [address]    Report Status PENDING  Incomplete    RADIOLOGY STUDIES/RESULTS: Dg Chest Port 1 View  Result Date: 01/31/2018 CLINICAL DATA:  Shortness of breath tonight. EXAM: PORTABLE CHEST 1 VIEW COMPARISON:  12/17/2017 FINDINGS: Postoperative changes in the mediastinum. Cardiac valve prosthesis. Mild cardiac enlargement. No pulmonary vascular congestion. Emphysematous changes are suggested in the lungs with perihilar streaky opacities and interstitial infiltrates in the lung bases suggesting chronic bronchitis and fibrosis. No focal consolidation. No blunting of costophrenic angles. No pneumothorax. Mediastinal contours appear intact. IMPRESSION: Emphysematous and chronic bronchitic changes in the lungs. No focal consolidation. Electronically Signed   By: Lucienne Capers M.D.   On: 01/31/2018 06:19   Dg Chest Port 1v Same Day  Result Date: 02/01/2018 CLINICAL DATA:  SOB Hx of atrial fib, CHF, COPD, HTN, DM2 EXAM: PORTABLE CHEST 1 VIEW COMPARISON:  01/31/2018 FINDINGS: Status post median sternotomy. Valve replacement. Patient has a loop recorder overlying the central chest. Lungs are mildly hyperinflated. There is prominence of interstitial markings, stable over multiple prior studies. Focal opacities are identified in the RIGHT UPPER lobe, LEFT LOWER lobe, RIGHT LOWER lobe, raising the question of acute superimposed infectious process. No pulmonary edema. IMPRESSION: Cardiomegaly and interstitial lung disease. Focal opacities in the RIGHT UPPER lobe and bilateral LOWER lobes may indicate acute infectious process or asymmetric pulmonary edema. Electronically Signed   By: Nolon Nations M.D.   On: 02/01/2018 09:15   [place] Ekg Site Rite  Result Date: 02/03/2018 If Site Rite image not attached, placement could not be confirmed due to current cardiac rhythm.    LOS: 3 days   Signature  Lala Lund M.D on 02/03/2018 at 11:00 AM  To page go to [email or web address] - password [facility]

## 2018-02-03 NOTE — Progress Notes (Signed)
INFECTIOUS DISEASE PROGRESS NOTE  ID: Angela Hines is a 84 y.o. female with  Active Problems:   Sepsis ([place])   Respiratory distress   Goals of care, counseling/discussion   Palliative care encounter  Subjective: No complaints.   Abtx:  Anti-infectives (From admission, onward)   Start     Dose/Rate Route Frequency Ordered Stop   02/01/18 0800  vancomycin (VANCOCIN) IVPB 750 mg/150 ml premix  Status:  Discontinued     750 mg 150 mL/hr over 60 Minutes Intravenous Every 24 hours 01/31/18 1102 01/31/18 2251   01/31/18 2300  cefTRIAXone (ROCEPHIN) 2 g in sodium chloride 0.9 % 100 mL IVPB     2 g 200 mL/hr over 30 Minutes Intravenous Every 24 hours 01/31/18 2252     01/31/18 1600  aztreonam (AZACTAM) 1 g in sodium chloride 0.9 % 100 mL IVPB  Status:  Discontinued     1 g 200 mL/hr over 30 Minutes Intravenous Every 8 hours 01/31/18 1102 01/31/18 2251   01/31/18 0700  aztreonam (AZACTAM) 2 g in sodium chloride 0.9 % 100 mL IVPB     2 g 200 mL/hr over 30 Minutes Intravenous  Once 01/31/18 0645 01/31/18 0826   01/31/18 0700  metroNIDAZOLE (FLAGYL) IVPB 500 mg  Status:  Discontinued     500 mg 100 mL/hr over 60 Minutes Intravenous Every 8 hours 01/31/18 0645 01/31/18 2251   01/31/18 0700  vancomycin (VANCOCIN) IVPB 1000 mg/200 mL premix     1,000 mg 200 mL/hr over 60 Minutes Intravenous  Once 01/31/18 0645 01/31/18 0834      Medications:  Scheduled: . amiodarone  100 mg Oral Daily  . budesonide (PULMICORT) nebulizer solution  0.25 mg Nebulization BID  . feeding supplement (ENSURE ENLIVE)  237 mL Oral BID BM  . furosemide  20 mg Oral Daily  . insulin aspart  0-9 Units Subcutaneous TID WC  . ipratropium  0.5 mg Nebulization BID  . levalbuterol  0.63 mg Nebulization BID  . [START ON 02/04/2018] methylPREDNISolone (SOLU-MEDROL) injection  40 mg Intravenous Daily  . metoprolol tartrate  50 mg Oral Q8H  . multivitamin with minerals  1 tablet Oral Daily  . Rivaroxaban  15 mg Oral  Q supper    Objective: Vital signs in last 24 hours: Temp:  [97.9 F (36.6 C)-98.4 F (36.9 C)] 98.4 F (36.9 C) (10/13 1235) Pulse Rate:  [96-120] 111 (10/13 1235) Resp:  [16-20] 18 (10/13 1235) BP: (117-134)/(83-91) 117/83 (10/13 1235) SpO2:  [93 %-98 %] 98 % (10/13 1235)   General appearance: alert, cooperative, mild distress and pale Resp: clear to auscultation bilaterally Cardio: regular rate and rhythm GI: normal findings: bowel sounds normal and soft, non-tender Extremities: edema none  Lab Results Recent Labs    02/02/18 0306 02/03/18 0457  WBC 16.4* 14.1*  HGB 7.6* 7.2*  HCT 27.8* 25.6*  NA 138 138  K 4.3 4.0  CL 105 104  CO2 22 22  BUN 27* 38*  CREATININE 0.69 0.87   Liver Panel Recent Labs    01/31/18 1444  PROT 6.2*  ALBUMIN 3.4*  AST 232*  ALT 214*  ALKPHOS 125  BILITOT 0.6   Sedimentation Rate No results for input(s): ESRSEDRATE in the last 72 hours. C-Reactive Protein No results for input(s): CRP in the last 72 hours.  Microbiology: Recent Results (from the past 240 hour(s))  Blood Culture (routine x 2)     Status: Abnormal   Collection Time: 01/31/18  6:54 AM  Result Value Ref Range Status   Specimen Description BLOOD RIGHT FOREARM  Final   Special Requests   Final    BOTTLES DRAWN AEROBIC AND ANAEROBIC Blood Culture results may not be optimal due to an inadequate volume of blood received in culture bottles   Culture  Setup Time   Final    GRAM POSITIVE COCCI IN CHAINS BLOOD ANAEROBIC BOTTLE CRITICAL RESULT CALLED TO, READ BACK BY AND VERIFIED WITH: N. JOHNSTON PHARM 01/31/18 2214 BEAMJ    Culture (A)  Final    VIRIDANS STREPTOCOCCUS THE SIGNIFICANCE OF ISOLATING THIS ORGANISM FROM A SINGLE SET OF BLOOD CULTURES WHEN MULTIPLE SETS ARE DRAWN IS UNCERTAIN. PLEASE NOTIFY THE MICROBIOLOGY DEPARTMENT WITHIN ONE WEEK IF SPECIATION AND SENSITIVITIES ARE REQUIRED. Performed at [facility], [place] [address], [place], [place] [address]      Report Status 02/02/2018 FINAL  Final  Blood Culture ID Panel (Reflexed)     Status: Abnormal   Collection Time: 01/31/18  6:54 AM  Result Value Ref Range Status   Enterococcus species NOT DETECTED NOT DETECTED Final   Listeria monocytogenes NOT DETECTED NOT DETECTED Final   Staphylococcus species NOT DETECTED NOT DETECTED Final   Staphylococcus aureus (BCID) NOT DETECTED NOT DETECTED Final   Streptococcus species DETECTED (A) NOT DETECTED Final    Comment: Not Enterococcus species, Streptococcus agalactiae, Streptococcus pyogenes, or Streptococcus pneumoniae. CRITICAL RESULT CALLED TO, READ BACK BY AND VERIFIED WITH: N. JOHNSTON PHARM 01/31/18 2214 BEAMJ    Streptococcus agalactiae NOT DETECTED NOT DETECTED Final   Streptococcus pneumoniae NOT DETECTED NOT DETECTED Final   Streptococcus pyogenes NOT DETECTED NOT DETECTED Final   Acinetobacter baumannii NOT DETECTED NOT DETECTED Final   Enterobacteriaceae species NOT DETECTED NOT DETECTED Final   Enterobacter cloacae complex NOT DETECTED NOT DETECTED Final   Escherichia coli NOT DETECTED NOT DETECTED Final   Klebsiella oxytoca NOT DETECTED NOT DETECTED Final   Klebsiella pneumoniae NOT DETECTED NOT DETECTED Final   Proteus species NOT DETECTED NOT DETECTED Final   Serratia marcescens NOT DETECTED NOT DETECTED Final   Haemophilus influenzae NOT DETECTED NOT DETECTED Final   Neisseria meningitidis NOT DETECTED NOT DETECTED Final   Pseudomonas aeruginosa NOT DETECTED NOT DETECTED Final   Candida albicans NOT DETECTED NOT DETECTED Final   Candida glabrata NOT DETECTED NOT DETECTED Final   Candida krusei NOT DETECTED NOT DETECTED Final   Candida parapsilosis NOT DETECTED NOT DETECTED Final   Candida tropicalis NOT DETECTED NOT DETECTED Final    Comment: Performed at [facility], [place] [address] [place], [place] [address]  Blood Culture (routine x 2)     Status: None (Preliminary result)   Collection Time: 01/31/18  7:39 AM   Result Value Ref Range Status   Specimen Description BLOOD LEFT WRIST  Final   Special Requests   Final    BOTTLES DRAWN AEROBIC AND ANAEROBIC Blood Culture results may not be optimal due to an inadequate volume of blood received in culture bottles   Culture   Final    NO GROWTH 3 DAYS Performed at [facility], [place] [address], [place], [place] [address]    Report Status PENDING  Incomplete  Urine culture     Status: None   Collection Time: 01/31/18  8:15 AM  Result Value Ref Range Status   Specimen Description URINE, CATHETERIZED  Final   Special Requests NONE  Final   Culture   Final    NO GROWTH Performed  at [facility], [place] [address] [place], [place] [address]    Report Status 02/01/2018 FINAL  Final  MRSA PCR Screening     Status: None   Collection Time: 01/31/18  1:59 PM  Result Value Ref Range Status   MRSA by PCR NEGATIVE NEGATIVE Final    Comment:        The GeneXpert MRSA Assay (FDA approved for NASAL specimens only), is one component of a comprehensive MRSA colonization surveillance program. It is not intended to diagnose MRSA infection nor to guide or monitor treatment for MRSA infections. Performed at [facility], [place] [address], [place], [place] [address]   Culture, blood (x 2)     Status: None (Preliminary result)   Collection Time: 01/31/18  2:44 PM  Result Value Ref Range Status   Specimen Description BLOOD RIGHT ANTECUBITAL  Final   Special Requests   Final    BOTTLES DRAWN AEROBIC ONLY Blood Culture results may not be optimal due to an inadequate volume of blood received in culture bottles   Culture   Final    NO GROWTH 3 DAYS Performed at [facility], [place] [address], [place], [place] [address]    Report Status PENDING  Incomplete    Studies/Results: [place] Ekg Site Rite  Result Date: 02/03/2018 If Site Rite image not attached, placement could not be confirmed due to current cardiac rhythm.  TTE: - Compared to a  prior study in 05/2017, the LVEF is slightly higher   at 45-50%. There is likely severe MR and moderate to severe TR.   An aortic bioprosthesis is noted. The mean aortic gradient has   increased from 19 mmHg to 29 mmHg, suggestive of possible   obstruction. Recommend TEE for closer evaluation given concern   for endocarditis.  Assessment/Plan: Viridans strep bacteremia Bioprosthetic AVR ~ 15 yr ago Loop recorder 2016 DM2 COPD  Total days of antibiotics: 2 ceftriaxone  Again, would favor 10 days of ceftriaxone then indefinite keflex 535m bid given her TTE findings.  Given her prognosis and her comorbidities, would not pursue TEE on this pt.  I explained this and entertained multiple questions from the pt and her various family members. They would like her to stay here for the next 8 days to receive her anbx.  Available as needed.    Allergies  Allergen Reactions  . Clonidine Swelling and Other (See Comments)    "head swelling"  . Captopril Swelling  . Codeine Nausea And Vomiting and Other (See Comments)    GI Intolerance   . Ampicillin Rash  . Ceclor [Cefaclor] Rash    OPAT Orders Discharge antibiotics: ceftriaxone 2g ivpb qday  Duration: 10 days End Date: 02-11-18  [facility]: please  Labs weekly while on IV antibiotics: _x_ CBC with differential __ BMP _x_ CMP __ CRP __ ESR __ Vancomycin trough __ CK  _x_ Please pull PIC at completion of IV antibiotics __ Please leave PIC in place until doctor has seen patient or been notified  Fax weekly labs to [phone] Clinic Follow Up Appt: PCP          JBobby RumpfMD, FACP Infectious Diseases (pager) [phone] 02/03/2018, 2:02 PM  LOS: 3 days

## 2018-02-04 ENCOUNTER — Other Ambulatory Visit (HOSPITAL_COMMUNITY): Payer: Medicare Other

## 2018-02-04 ENCOUNTER — Inpatient Hospital Stay: Payer: Self-pay

## 2018-02-04 DIAGNOSIS — B955 Unspecified streptococcus as the cause of diseases classified elsewhere: Secondary | ICD-10-CM

## 2018-02-04 LAB — COMPREHENSIVE METABOLIC PANEL
ALBUMIN: 2.9 g/dL — AB (ref 3.5–5.0)
ALK PHOS: 130 U/L — AB (ref 38–126)
ALT: 1262 U/L — AB (ref 0–44)
AST: 625 U/L — ABNORMAL HIGH (ref 15–41)
Anion gap: 9 (ref 5–15)
BUN: 42 mg/dL — AB (ref 8–23)
CALCIUM: 8.4 mg/dL — AB (ref 8.9–10.3)
CO2: 25 mmol/L (ref 22–32)
CREATININE: 0.84 mg/dL (ref 0.44–1.00)
Chloride: 104 mmol/L (ref 98–111)
GFR calc Af Amer: >60 mL/min (ref >60)
GFR calc non Af Amer: >60 mL/min (ref >60)
GLUCOSE: 136 mg/dL — AB (ref 70–99)
Potassium: 3.9 mmol/L (ref 3.5–5.1)
Sodium: 138 mmol/L (ref 135–145)
Total Bilirubin: 0.4 mg/dL (ref 0.3–1.2)
Total Protein: 5.5 g/dL — ABNORMAL LOW (ref 6.5–8.1)

## 2018-02-04 LAB — CBC
HCT: 23.7 % — ABNORMAL LOW (ref 36.0–46.0)
HEMOGLOBIN: 6.6 g/dL — AB (ref 12.0–15.0)
MCH: 22.1 pg — AB (ref 26.0–34.0)
MCHC: 27.8 g/dL — ABNORMAL LOW (ref 30.0–36.0)
MCV: 79.5 fL — ABNORMAL LOW (ref 80.0–100.0)
Platelets: 213 10*3/uL (ref 150–400)
RBC: 2.98 MIL/uL — AB (ref 3.87–5.11)
RDW: 17.5 % — ABNORMAL HIGH (ref 11.5–15.5)
WBC: 14.3 10*3/uL — ABNORMAL HIGH (ref 4.0–10.5)
nRBC: 1.7 % — ABNORMAL HIGH (ref 0.0–0.2)

## 2018-02-04 LAB — GLUCOSE, CAPILLARY
Glucose-Capillary: 126 mg/dL — ABNORMAL HIGH (ref 70–99)
Glucose-Capillary: 284 mg/dL — ABNORMAL HIGH (ref 70–99)
Glucose-Capillary: 255 mg/dL — ABNORMAL HIGH (ref 70–99)
Glucose-Capillary: 133 mg/dL — ABNORMAL HIGH (ref 70–99)

## 2018-02-04 LAB — PREPARE RBC (CROSSMATCH)

## 2018-02-04 MED ORDER — SODIUM CHLORIDE 0.9% FLUSH: 10.0000 mL | INTRAVENOUS | Status: DC | PRN

## 2018-02-04 MED ORDER — SODIUM CHLORIDE 0.9% IV SOLUTION: Freq: Once | INTRAVENOUS | Status: DC

## 2018-02-04 MED ORDER — DIGOXIN 0.25 MG/ML IJ SOLN
0.2500 mg | Freq: Four times a day (QID) | INTRAMUSCULAR | Status: AC
  Administered 2018-02-04 (×2): 0.25 mg via INTRAVENOUS
  Filled 2018-02-04 (×2): qty 2

## 2018-02-04 MED ORDER — MORPHINE SULFATE (CONCENTRATE) 10 MG/0.5ML PO SOLN: 2.5000 mg | ORAL | Status: DC | PRN

## 2018-02-04 MED ORDER — SENNA 8.6 MG PO TABS
2.0000 | ORAL_TABLET | Freq: Once | ORAL | Status: AC
  Administered 2018-02-04: 17.2 mg via ORAL
  Filled 2018-02-04: qty 2

## 2018-02-04 MED ORDER — SENNA 8.6 MG PO TABS: 1.0000 | ORAL_TABLET | Freq: Every day | ORAL | Status: DC

## 2018-02-04 NOTE — Progress Notes (Signed)
  Speech Language Pathology Treatment: Dysphagia  Patient Details Name: Angela Hines MRN: [id number] DOB: 10-Jul-1932 Today's Date: 02/04/2018 Time: 5945-8592 SLP Time Calculation (min) (ACUTE ONLY): 11 min  Assessment / Plan / Recommendation Clinical Impression  Pt does not appear to be as short of breath as described during previous SLP visits. She and her son report improved respiratory status as well; son also describes improving mentation. She consumed regular diet textures and thin liquids without overt signs of aspiration. Mastication is appropriate considering that pt has partial dentures that she chose not to place for PO trials. Recommend continuing current diet; precautions were reviewed with pt/son. SLP to sign off for now.   HPI HPI: Patient is an 84 year old Caucasian female, with past medical history significant for COPD, presbyesophagus, CHF with an EF of 40 to 45%, paroxysmal atrial fibrillation, diabetes mellitus, hypertension, hyperlipidemia, aortic stenosis status post bioprosthetic aortic valve replacement. Admitted for respiratory distress and on BiPAP. Found to be in atrial fibrillation and tachycardic. Chest x-ray Focal opacities in the RIGHT UPPER lobe and bilateral LOWER lobes may indicate acute infectious process or asymmetric pulmonary edema. BSE 04/2017  symptoms appeared to be esophageal in nature.       SLP Plan  All goals met       Recommendations  Diet recommendations: Regular;Thin liquid Liquids provided via: Cup;Straw Medication Administration: Whole meds with puree Supervision: Patient able to self feed;Intermittent supervision to cue for compensatory strategies Compensations: Slow rate;Small sips/bites Postural Changes and/or Swallow Maneuvers: Seated upright 90 degrees;Upright 30-60 min after meal                Oral Care Recommendations: Oral care BID Follow up Recommendations: None SLP Visit Diagnosis: Dysphagia, pharyngeal phase  (R13.13) Plan: All goals met       GO                Germain Osgood 02/04/2018, 2:36 PM  Germain Osgood, M.A. [place] Acute Environmental education officer [phone] Office [phone]

## 2018-02-04 NOTE — Progress Notes (Addendum)
SLP Cancellation Note  Patient Details Name: Shavon Ashmore MRN: [id number] DOB: 06/11/1932   Cancelled treatment:       Reason Eval/Treat Not Completed: Other (comment) Pt needing to use the restroom - pt/family politely requested that I return. WIll f/u as able.  Returned to see pt again around 12:00 but pt was now with other providers. Will continue efforts.   Germain Osgood 02/04/2018, 10:56 AM  Germain Osgood, M.A. [place] Acute Environmental education officer [phone] Office [phone]

## 2018-02-04 NOTE — Progress Notes (Signed)
Physical Therapy Treatment Patient Details Name: Angela Hines MRN: [id number] DOB: 18-Feb-1933 Today's Date: 02/04/2018    History of Present Illness Pt adm with acute hypoxic respiratory failure thought to be due to COPD exacerbation. Pt found to have strep viridans bacteremia. PMH - atrial fibrillation on anticoagulation, COPD, chronic combined systolic/diastolic heart failure, DM-2, bioprosthetic aortic valve replacement.    PT Comments    Patient seen for activity progression. Making steady gaines to PT goals. Progressing well with less physical assist and cues today. Current POC remains appropriate. Will continue to see and progress as tolerated.   Follow Up Recommendations  SNF(if family able to provide assist then may consider home)     Equipment Recommendations  None recommended by PT    Recommendations for Other Services       Precautions / Restrictions Precautions Precautions: Fall    Mobility  Bed Mobility Overal bed mobility: Needs Assistance Bed Mobility: Supine to Sit;Sit to Supine     Supine to sit: Supervision;HOB elevated Sit to supine: Supervision   General bed mobility comments: supervision for safety  Transfers Overall transfer level: Needs assistance Equipment used: None Transfers: Sit to/from Stand Sit to Stand: Min guard         General transfer comment: performed from bed and from toilet  Ambulation/Gait Ambulation/Gait assistance: Min guard Gait Distance (Feet): 40 Feet Assistive device: None Gait Pattern/deviations: Step-through pattern;Decreased stride length Gait velocity: decr Gait velocity interpretation: 1.31 - 2.62 ft/sec, indicative of limited community ambulator General Gait Details: Assist for safety. Pt fatigued very quickly and had to sit on rollator and ride to room   Stairs             Wheelchair Mobility    Modified Rankin (Stroke Patients Only)       Balance Overall balance assessment: Needs  assistance Sitting-balance support: No upper extremity supported;Feet supported Sitting balance-Leahy Scale: Good     Standing balance support: No upper extremity supported;During functional activity Standing balance-Leahy Scale: Fair                              Cognition Arousal/Alertness: Awake/alert Behavior During Therapy: WFL for tasks assessed/performed Overall Cognitive Status: Within Functional Limits for tasks assessed                                        Exercises      General Comments        Pertinent Vitals/Pain Pain Assessment: No/denies pain    Home Living                      Prior Function            PT Goals (current goals can now be found in the care plan section) Acute Rehab PT Goals Patient Stated Goal: return to [facility] PT Goal Formulation: With patient Time For Goal Achievement: 02/16/18 Potential to Achieve Goals: Fair Progress towards PT goals: Progressing toward goals    Frequency    Min 3X/week      PT Plan Current plan remains appropriate    Co-evaluation              AM-PAC PT "6 Clicks" Daily Activity  Outcome Measure  Difficulty turning over in bed (including adjusting bedclothes, sheets and blankets)?: A Little Difficulty moving from lying  on back to sitting on the side of the bed? : A Little Difficulty sitting down on and standing up from a chair with arms (e.g., wheelchair, bedside commode, etc,.)?: A Little Help needed moving to and from a bed to chair (including a wheelchair)?: A Little Help needed walking in hospital room?: A Little Help needed climbing 3-5 steps with a railing? : A Lot 6 Click Score: 17    End of Session Equipment Utilized During Treatment: Gait belt Activity Tolerance: Patient limited by fatigue Patient left: in bed;with call bell/phone within reach;with bed alarm set;with family/visitor present Nurse Communication: Mobility status PT Visit  Diagnosis: Other abnormalities of gait and mobility (R26.89);Muscle weakness (generalized) (M62.81);Repeated falls (R29.6)     Time: 8144-8185 PT Time Calculation (min) (ACUTE ONLY): 17 min  Charges:  $Therapeutic Activity: 8-22 mins                     Alben Deeds, PT DPT  Board Certified Neurologic Specialist [place] Pager [phone] Office [place] 02/04/2018, 4:59 PM

## 2018-02-04 NOTE — Discharge Instructions (Addendum)
Follow with Primary MD Kelton Pillar, MD in 7 days   Activity: As tolerated with Full fall precautions use walker/cane & assistance as needed  Disposition Home   Diet: Heart Healthy with feeding assistance and aspiration precautions.    Special Instructions: If you have smoked or chewed Tobacco  in the last 2 yrs please stop smoking, stop any regular Alcohol  and or any Recreational drug use.  On your next visit with your primary care physician please Get Medicines reviewed and adjusted.  Please request your Prim.MD to go over all Hospital Tests and Procedure/Radiological results at the follow up, please get all Hospital records sent to your Prim MD by signing hospital release before you go home.  If you experience worsening of your admission symptoms, develop shortness of breath, life threatening emergency, suicidal or homicidal thoughts you must seek medical attention immediately by calling 911 or calling your MD immediately  if symptoms less severe.  You Must read complete instructions/literature along with all the possible adverse reactions/side effects for all the Medicines you take and that have been prescribed to you. Take any new Medicines after you have completely understood and accpet all the possible adverse reactions/side effects.       Information on my medicine - XARELTO (Rivaroxaban)  Why was Xarelto prescribed for you? Xarelto was prescribed for you to reduce the risk of a blood clot forming that can cause a stroke if you have a medical condition called atrial fibrillation (a type of irregular heartbeat).  What do you need to know about xarelto ? Take your Xarelto ONCE DAILY at the same time every day with your evening meal. If you have difficulty swallowing the tablet whole, you may crush it and mix in applesauce just prior to taking your dose.  Take Xarelto exactly as prescribed by your doctor and DO NOT stop taking Xarelto without talking to the doctor who  prescribed the medication.  Stopping without other stroke prevention medication to take the place of Xarelto may increase your risk of developing a clot that causes a stroke.  Refill your prescription before you run out.  After discharge, you should have regular check-up appointments with your healthcare provider that is prescribing your Xarelto.  In the future your dose may need to be changed if your kidney function or weight changes by a significant amount.  What do you do if you miss a dose? If you are taking Xarelto ONCE DAILY and you miss a dose, take it as soon as you remember on the same day then continue your regularly scheduled once daily regimen the next day. Do not take two doses of Xarelto at the same time or on the same day.   Important Safety Information A possible side effect of Xarelto is bleeding. You should call your healthcare provider right away if you experience any of the following: ? Bleeding from an injury or your nose that does not stop. ? Unusual colored urine (red or dark brown) or unusual colored stools (red or black). ? Unusual bruising for unknown reasons. ? A serious fall or if you hit your head (even if there is no bleeding).  Some medicines may interact with Xarelto and might increase your risk of bleeding while on Xarelto. To help avoid this, consult your healthcare provider or pharmacist prior to using any new prescription or non-prescription medications, including herbals, vitamins, non-steroidal anti-inflammatory drugs (NSAIDs) and supplements.  This website has more information on Xarelto: [email or web address]

## 2018-02-04 NOTE — Progress Notes (Signed)
Palliative care   Received phone call from patient's son, Harrie Jeans.  I called back and family expressed concern regarding plan for discharge.  Son reports that their understanding was that patient would be in hospital for 10 day period for IV antibiotics but were told today that plan is for PICC line placement and continue IV abx as an outpatient.  We discussed that most important thing is to make sure patient gets needed care, and if there are care needs that can only be met in the hospital (if contraindication to placing PICC) then she would need to stay in the hospital.  If, however, her care needs can be met in outpatient setting, we will need to work toward this transition.  We discussed plan to see if ID had any concern about plan for outpatient antibiotics and, if not, we will need to begin process of planning for discharge.  If she discharges with plan to complete IV antibiotics as an outpatient, would recommend she be followed by outpatient palliative care.  PMT to follow up tomorrow.  Micheline Rough, MD [place] Palliative Medicine Team [phone]  NO CHARGE NOTE

## 2018-02-04 NOTE — Progress Notes (Signed)
Palliative:   I met today at Ms. Ohlendorf's bedside. Her son, Harrie Jeans, and [name]'s (son/HCPOA) wife at bedside. We discussed plan for transition to SNF to continue recommended course of IV antibiotics for bacteremia then transition to po antibiotics. She is hopeful to return to [facility] after IV antibiotics complete. Ms. Villamar has some baseline confusion and does not understand the severity of her illness with COPD and combined systolic and diastolic heart failure. She says multiple times through conversation "I didn't know I was that sick." We explained that she is very sick. She shares her desire for comfort and peace (confirms DNR) but says "I don't want to just role over." She wants to work on the reversible but to also work on comfort especially at the end of her life.   Family has good understanding of the severity of her illness and are interested in pursuing hospice services after she completes IV antibiotics. She is doing well with prn morphine and this will be changed from IV to po today - family pleased with results. She should be eligible for hospice services on po antibiotics because she will be under hospice care for COPD/CHF and not for bacteremia. Overall prognosis is poor as she is frail and elderly.   Exam: Alert, underlying confusion to situation. Good spirits. Room air. Breathing much better with morphine.   82 min  Vinie Sill, NP Palliative Medicine Team Pager # [phone] (M-F 8a-5p) Team Phone # [phone] (Nights/Weekends)

## 2018-02-04 NOTE — Progress Notes (Signed)
PROGRESS NOTE        PATIENT DETAILS Name: Angela Hines Age: 84 y.o. Sex: female Date of Birth: 1932/07/02 Admit Date: 01/31/2018 Admitting Physician Bonnell Public, MD [id number], Margaretha Sheffield, MD  Brief Narrative: Patient is a 84 y.o. female with history of atrial fibrillation on anticoagulation, COPD, chronic combined systolic/diastolic heart failure, DM-2, bioprosthetic aortic valve replacement, numerous hospitalizations this year alone-presenting with acute hypoxic respiratory failure felt to be secondary to COPD exacerbation, further evaluation revealed strep viridans bacteremia.  See below for further details   Subjective:  Patient in bed, appears comfortable, denies any headache, no fever, no chest pain or pressure, no shortness of breath , no abdominal pain. No focal weakness.   Assessment/Plan:  Acute hypoxic respiratory failure secondary to COPD exacerbation: liberated off BiPAP on 10/11 morning.  Much improved-moving air well with only scattered few rhonchi.  Continue to taper IV steroids & switch to oral prednisone upon discharge, continue supportive care with oxygen and nebulizer treatments as needed, currently on room air and in no distress.    Sepsis secondary to Strep viridans bacteremia: Sepsis pathophysiology has rapidly resolved.  Suspicion for endocarditis given bioprosthetic aortic valve in place.  In by ID, awaiting transthoracic echocardiogram, TEE not done due to her advanced age, plan is at least 10 days of IV Rocephin we will place a midline is patient needs only 5 more days of IV antibiotics thereafter will be switched to oral Keflex indefinitely, TTE largely inconclusive, no TEE due to patient's age and frail underlying condition.   Atrial fibrillation with RVR: Initially required Cardizem infusion-this was titrated off on 10/11-goal is rate controlled currently on combination of oral amiodarone and Lopressor along with Xarelto for  anticoagulation.  Mali vas 2 score is at least 4.  Rate control is suboptimal and blood pressures are low, will try 2 doses of IV digoxin and monitor rate .  Chronic combined systolic/diastolic heart failure: Compensated-volume status stable-continue diuretics.  Anemia: Anemia secondary to chronic disease-worsened by acute illness-follow for now.  She is getting 1 unit of packed RBC on 02/04/2018, after this family wants full comfort measures only and no further escalation in care.  No signs of ongoing active bleeding.  Failure to thrive syndrome/debility: Continue PT eval-DNR in place-has had numerous hospitalizations this year-long discussion with the patient's son at bedside yesterday-may be prudent to limit care-and transition to comfort measures if she deteriorates further.  Palliative care following-we will continue to engage with family.  History of bioprosthetic AVR-with moderate aortic stenosis: See above regarding concerns of endocarditis given Streptococcus viridans bacteremia.  Severe malnutrition: Continue supplements  Palliative care: DNR in place-family does understand poor overall prognosis-palliative care following.  We will continue to engage with family-and try and define further goals of care now that she may have prosthetic valve endocarditis.    Telemetry (independently reviewed): Med  DVT Prophylaxis: Full dose anticoagulation with Xarelto  Code Status: DNR  Family Communication: Son in detail over the phone on 02/03/2018, he was explained that patient need not stay in the hospital for 10 days of IV antibiotics, most likely she will be discharged with IV antibiotics for 7 to 10 days thereafter she can be transitioned to hospice either at home or at [facility].  Disposition Plan: Remain inpatient  Antimicrobial agents: Anti-infectives (From admission, onward)   Start  Dose/Rate Route Frequency Ordered Stop   02/01/18 0800  vancomycin (VANCOCIN) IVPB 750 mg/150 ml  premix  Status:  Discontinued     750 mg 150 mL/hr over 60 Minutes Intravenous Every 24 hours 01/31/18 1102 01/31/18 2251   01/31/18 2300  cefTRIAXone (ROCEPHIN) 2 g in sodium chloride 0.9 % 100 mL IVPB     2 g 200 mL/hr over 30 Minutes Intravenous Every 24 hours 01/31/18 2252     01/31/18 1600  aztreonam (AZACTAM) 1 g in sodium chloride 0.9 % 100 mL IVPB  Status:  Discontinued     1 g 200 mL/hr over 30 Minutes Intravenous Every 8 hours 01/31/18 1102 01/31/18 2251   01/31/18 0700  aztreonam (AZACTAM) 2 g in sodium chloride 0.9 % 100 mL IVPB     2 g 200 mL/hr over 30 Minutes Intravenous  Once 01/31/18 0645 01/31/18 0826   01/31/18 0700  metroNIDAZOLE (FLAGYL) IVPB 500 mg  Status:  Discontinued     500 mg 100 mL/hr over 60 Minutes Intravenous Every 8 hours 01/31/18 0645 01/31/18 2251   01/31/18 0700  vancomycin (VANCOCIN) IVPB 1000 mg/200 mL premix     1,000 mg 200 mL/hr over 60 Minutes Intravenous  Once 01/31/18 0645 01/31/18 0834      Procedures:  Transthoracic echocardiogram - Compared to a prior study in 05/2017, the LVEF is slightly higher at 45-50%. There is likely severe MR and moderate to severe TR. An aortic bioprosthesis is noted. The mean aortic gradient has increased from 19 mmHg to 29 mmHg, suggestive of possible obstruction. Recommend TEE for closer evaluation given concern  for endocarditis.  CONSULTS:  Palliative care, ID  Time spent: 25 minutes-Greater than 50% of this time was spent in counseling, explanation of diagnosis, planning of further management, and coordination of care.  MEDICATIONS: Scheduled Meds: . sodium chloride   Intravenous Once  . amiodarone  100 mg Oral Daily  . budesonide (PULMICORT) nebulizer solution  0.25 mg Nebulization BID  . feeding supplement (ENSURE ENLIVE)  237 mL Oral BID BM  . furosemide  20 mg Oral Daily  . insulin aspart  0-9 Units Subcutaneous TID WC  . ipratropium  0.5 mg Nebulization BID  . levalbuterol  0.63 mg Nebulization  BID  . methylPREDNISolone (SOLU-MEDROL) injection  40 mg Intravenous Daily  . metoprolol tartrate  50 mg Oral Q8H  . multivitamin with minerals  1 tablet Oral Daily  . Rivaroxaban  15 mg Oral Q supper   Continuous Infusions: . sodium chloride 200 mL (02/03/18 2120)  . cefTRIAXone (ROCEPHIN)  IV Stopped (02/03/18 2300)   PRN Meds:.sodium chloride, albuterol, bisacodyl, LORazepam **OR** LORazepam, metoprolol tartrate, morphine injection   PHYSICAL EXAM: Vital signs: Vitals:   02/04/18 0745 02/04/18 0859 02/04/18 0901 02/04/18 0906  BP: 106/86     Pulse: (!) 104 (!) 109 (!) 109   Resp:  17 17   Temp: (!) 97.1 F (36.2 C)     TempSrc: Axillary     SpO2: 99%  98% 100%  Weight:      Height:       Filed Weights   01/31/18 0645  Weight: 49 kg   Body mass index is 16.92 kg/m.   Exam  Awake, pleasantly confused, No new F.N deficits, Normal affect [place].AT,PERRAL Supple Neck,No JVD, No cervical lymphadenopathy appriciated.  Symmetrical Chest wall movement, Good air movement bilaterally, CTAB RRR,No Gallops, Rubs, positive systolic murmur, No Parasternal Heave +ve B.Sounds, Abd Soft, No tenderness, No organomegaly  appriciated, No rebound - guarding or rigidity. No Cyanosis, Clubbing or edema, No new Rash or bruise    I have personally reviewed following labs and imaging studies  LABORATORY DATA:  CBC: Recent Labs  Lab 01/31/18 0559 01/31/18 1444 02/01/18 0138 02/02/18 0306 02/03/18 0457 02/04/18 0507  WBC 18.1* 24.6* 18.9* 16.4* 14.1* 14.3*  NEUTROABS 11.2* 22.8*  --   --   --   --   HGB 7.8* 7.8* 7.6* 7.6* 7.2* 6.6*  HCT 28.7* 30.2* 26.9* 27.8* 25.6* 23.7*  MCV 84.7 86.0 81.5 80.6 79.8* 79.5*  PLT 455* 412* 352 363 314 938    Basic Metabolic Panel: Recent Labs  Lab 01/31/18 1444 02/01/18 0833 02/02/18 0306 02/03/18 0457 02/03/18 1109 02/04/18 0507  NA 138 138 138 138  --  138  K 5.0 4.0 4.3 4.0  --  3.9  CL 107 107 105 104  --  104  CO2 18* 21* 22 22   --  25  GLUCOSE 174* 182* 183* 160*  --  136*  BUN 19 18 27* 38*  --  42*  CREATININE 0.85 0.64 0.69 0.87  --  0.84  CALCIUM 8.5* 8.7* 9.1 8.9  --  8.4*  MG 1.8  --   --   --  2.2  --   PHOS 4.9*  --   --   --   --   --     GFR: Estimated Creatinine Clearance: 39.3 mL/min (by C-G formula based on SCr of 0.84 mg/dL).  Liver Function Tests: Recent Labs  Lab 01/31/18 0559 01/31/18 1444 02/04/18 0507  AST 81* 232* 625*  ALT 52* 214* 1,262*  ALKPHOS 108 125 130*  BILITOT 0.4 0.6 0.4  PROT 6.1* 6.2* 5.5*  ALBUMIN 3.5 3.4* 2.9*   No results for input(s): LIPASE, AMYLASE in the last 168 hours. No results for input(s): AMMONIA in the last 168 hours.  Coagulation Profile: Recent Labs  Lab 01/31/18 1444  INR 1.26    Cardiac Enzymes: Recent Labs  Lab 01/31/18 1444 01/31/18 2018 02/01/18 0138  TROPONINI 0.05* 0.09* 0.12*    BNP (last 3 results) No results for input(s): PROBNP in the last 8760 hours.  HbA1C: No results for input(s): HGBA1C in the last 72 hours.  CBG: Recent Labs  Lab 02/03/18 0733 02/03/18 1151 02/03/18 1644 02/03/18 2123 02/04/18 0831  GLUCAP 149* 210* 263* 163* 126*    Lipid Profile: No results for input(s): CHOL, HDL, LDLCALC, TRIG, CHOLHDL, LDLDIRECT in the last 72 hours.  Thyroid Function Tests: No results for input(s): TSH, T4TOTAL, FREET4, T3FREE, THYROIDAB in the last 72 hours.  Anemia Panel: Recent Labs    02/03/18 1109  VITAMINB12 1,955*  FOLATE 22.4  FERRITIN 39  TIBC 455*  IRON 14*  RETICCTPCT 1.9    Urine analysis:    Component Value Date/Time   COLORURINE YELLOW 01/31/2018 0645   APPEARANCEUR CLEAR 01/31/2018 0645   LABSPEC 1.018 01/31/2018 0645   PHURINE 6.0 01/31/2018 0645   GLUCOSEU 150 (A) 01/31/2018 0645   HGBUR NEGATIVE 01/31/2018 0645   BILIRUBINUR NEGATIVE 01/31/2018 0645   KETONESUR NEGATIVE 01/31/2018 0645   PROTEINUR 30 (A) 01/31/2018 0645   NITRITE NEGATIVE 01/31/2018 0645   LEUKOCYTESUR NEGATIVE  01/31/2018 0645    Sepsis Labs: Lactic Acid, Venous    Component Value Date/Time   LATICACIDVEN 3.3 ([place]) 01/31/2018 1629    MICROBIOLOGY: Recent Results (from the past 240 hour(s))  Blood Culture (routine x 2)  Status: Abnormal   Collection Time: 01/31/18  6:54 AM  Result Value Ref Range Status   Specimen Description BLOOD RIGHT FOREARM  Final   Special Requests   Final    BOTTLES DRAWN AEROBIC AND ANAEROBIC Blood Culture results may not be optimal due to an inadequate volume of blood received in culture bottles   Culture  Setup Time   Final    GRAM POSITIVE COCCI IN CHAINS BLOOD ANAEROBIC BOTTLE CRITICAL RESULT CALLED TO, READ BACK BY AND VERIFIED WITH: N. [place] [facility] 01/31/18 2214 BEAMJ    Culture (A)  Final    VIRIDANS STREPTOCOCCUS THE SIGNIFICANCE OF ISOLATING THIS ORGANISM FROM A SINGLE SET OF BLOOD CULTURES WHEN MULTIPLE SETS ARE DRAWN IS UNCERTAIN. PLEASE NOTIFY THE MICROBIOLOGY DEPARTMENT WITHIN ONE WEEK IF SPECIATION AND SENSITIVITIES ARE REQUIRED. Performed at [facility], [place] [address], [place], [place] [address]    Report Status 02/02/2018 FINAL  Final  Blood Culture ID Panel (Reflexed)     Status: Abnormal   Collection Time: 01/31/18  6:54 AM  Result Value Ref Range Status   Enterococcus species NOT DETECTED NOT DETECTED Final   Listeria monocytogenes NOT DETECTED NOT DETECTED Final   Staphylococcus species NOT DETECTED NOT DETECTED Final   Staphylococcus aureus (BCID) NOT DETECTED NOT DETECTED Final   Streptococcus species DETECTED (A) NOT DETECTED Final    Comment: Not Enterococcus species, Streptococcus agalactiae, Streptococcus pyogenes, or Streptococcus pneumoniae. CRITICAL RESULT CALLED TO, READ BACK BY AND VERIFIED WITH: N. [facility] 01/31/18 2214 BEAMJ    Streptococcus agalactiae NOT DETECTED NOT DETECTED Final   Streptococcus pneumoniae NOT DETECTED NOT DETECTED Final   Streptococcus pyogenes NOT DETECTED NOT DETECTED Final    Acinetobacter baumannii NOT DETECTED NOT DETECTED Final   Enterobacteriaceae species NOT DETECTED NOT DETECTED Final   Enterobacter cloacae complex NOT DETECTED NOT DETECTED Final   Escherichia coli NOT DETECTED NOT DETECTED Final   Klebsiella oxytoca NOT DETECTED NOT DETECTED Final   Klebsiella pneumoniae NOT DETECTED NOT DETECTED Final   Proteus species NOT DETECTED NOT DETECTED Final   Serratia marcescens NOT DETECTED NOT DETECTED Final   Haemophilus influenzae NOT DETECTED NOT DETECTED Final   Neisseria meningitidis NOT DETECTED NOT DETECTED Final   Pseudomonas aeruginosa NOT DETECTED NOT DETECTED Final   Candida albicans NOT DETECTED NOT DETECTED Final   Candida glabrata NOT DETECTED NOT DETECTED Final   Candida krusei NOT DETECTED NOT DETECTED Final   Candida parapsilosis NOT DETECTED NOT DETECTED Final   Candida tropicalis NOT DETECTED NOT DETECTED Final    Comment: Performed at [facility], [place] [address], [place], [place] [address]  Blood Culture (routine x 2)     Status: None (Preliminary result)   Collection Time: 01/31/18  7:39 AM  Result Value Ref Range Status   Specimen Description BLOOD LEFT WRIST  Final   Special Requests   Final    BOTTLES DRAWN AEROBIC AND ANAEROBIC Blood Culture results may not be optimal due to an inadequate volume of blood received in culture bottles   Culture   Final    NO GROWTH 3 DAYS Performed at [facility], [place] [address] [place], [place] [address]    Report Status PENDING  Incomplete  Urine culture     Status: None   Collection Time: 01/31/18  8:15 AM  Result Value Ref Range Status   Specimen Description URINE, CATHETERIZED  Final   Special Requests NONE  Final   Culture  Final    NO GROWTH Performed at [facility], [place] [address], [place], [place] [address]    Report Status 02/01/2018 FINAL  Final  MRSA PCR Screening     Status: None   Collection Time: 01/31/18  1:59 PM  Result Value Ref Range Status   MRSA  by PCR NEGATIVE NEGATIVE Final    Comment:        The GeneXpert MRSA Assay (FDA approved for NASAL specimens only), is one component of a comprehensive MRSA colonization surveillance program. It is not intended to diagnose MRSA infection nor to guide or monitor treatment for MRSA infections. Performed at [facility], [place] [address] [place], [place] [address]   Culture, blood (x 2)     Status: None (Preliminary result)   Collection Time: 01/31/18  2:44 PM  Result Value Ref Range Status   Specimen Description BLOOD RIGHT ANTECUBITAL  Final   Special Requests   Final    BOTTLES DRAWN AEROBIC ONLY Blood Culture results may not be optimal due to an inadequate volume of blood received in culture bottles   Culture   Final    NO GROWTH 3 DAYS Performed at [facility], [place] [address], [place], [place] [address]    Report Status PENDING  Incomplete    RADIOLOGY STUDIES/RESULTS: Dg Chest Port 1 View  Result Date: 01/31/2018 CLINICAL DATA:  Shortness of breath tonight. EXAM: PORTABLE CHEST 1 VIEW COMPARISON:  12/17/2017 FINDINGS: Postoperative changes in the mediastinum. Cardiac valve prosthesis. Mild cardiac enlargement. No pulmonary vascular congestion. Emphysematous changes are suggested in the lungs with perihilar streaky opacities and interstitial infiltrates in the lung bases suggesting chronic bronchitis and fibrosis. No focal consolidation. No blunting of costophrenic angles. No pneumothorax. Mediastinal contours appear intact. IMPRESSION: Emphysematous and chronic bronchitic changes in the lungs. No focal consolidation. Electronically Signed   By: Lucienne Capers M.D.   On: 01/31/2018 06:19   Dg Chest Port 1v Same Day  Result Date: 02/01/2018 CLINICAL DATA:  SOB Hx of atrial fib, CHF, COPD, HTN, DM2 EXAM: PORTABLE CHEST 1 VIEW COMPARISON:  01/31/2018 FINDINGS: Status post median sternotomy. Valve replacement. Patient has a loop recorder overlying the central chest.  Lungs are mildly hyperinflated. There is prominence of interstitial markings, stable over multiple prior studies. Focal opacities are identified in the RIGHT UPPER lobe, LEFT LOWER lobe, RIGHT LOWER lobe, raising the question of acute superimposed infectious process. No pulmonary edema. IMPRESSION: Cardiomegaly and interstitial lung disease. Focal opacities in the RIGHT UPPER lobe and bilateral LOWER lobes may indicate acute infectious process or asymmetric pulmonary edema. Electronically Signed   By: Nolon Nations M.D.   On: 02/01/2018 09:15   [place] Ekg Site Rite  Result Date: 02/04/2018 If Site Rite image not attached, placement could not be confirmed due to current cardiac rhythm.  [place] Ekg Site Rite  Result Date: 02/03/2018 If Site Rite image not attached, placement could not be confirmed due to current cardiac rhythm.    LOS: 4 days   Signature  Lala Lund M.D on 02/04/2018 at 9:50 AM  To page go to [email or web address] - password [facility]

## 2018-02-04 NOTE — NC FL2 (Signed)
[place] LEVEL OF CARE SCREENING TOOL     IDENTIFICATION  Patient Name: Angela Hines Birthdate: 18-Dec-1932 Sex: female Admission Date (Current Location): 01/31/2018  [facility] and [place] Number:  Herbalist and Address:  The . [facility], [place] [address], [place], [place] [address]      Provider Number: [id number]  Attending Physician Name and Address:  Thurnell Lose, MD  Relative Name and Phone Number:  Legrand Como, son, [phone]    Current Level of Care: Hospital Recommended Level of Care: [place] Prior Approval Number:    Date Approved/Denied:   PASRR Number: [id number]  Discharge Plan: SNF    Current Diagnoses: Patient Active Problem List   Diagnosis Date Noted  . Streptococcal bacteremia   . Respiratory distress   . Goals of care, counseling/discussion   . Palliative care encounter   . Sepsis ([place]) 01/31/2018  . Diabetes mellitus without complication ([place]) 15/17/6160  . Depression with anxiety 12/17/2017  . UTI (urinary tract infection) 12/17/2017  . Hypokalemia 12/17/2017  . Intussusception ([place])   . Falls frequently 12/14/2017  . Paracetamol (acetaminophen) overdose, accidental or unintentional, initial encounter 11/21/2017  . Nausea & vomiting 09/10/2017  . Chest pain 09/10/2017  . High anion gap metabolic acidosis 73/71/0626  . Chronic systolic CHF (congestive heart failure) ([place]) 09/10/2017  . Protein-calorie malnutrition, severe 06/11/2017  . Prolonged QT interval   . COPD (chronic obstructive pulmonary disease) ([place]) 07/17/2016  . Diabetes mellitus ([place]) 07/17/2016  . Gastroesophageal reflux disease without esophagitis 07/17/2016  . Generalized anxiety disorder 07/17/2016  . Hypercholesterolemia 07/17/2016  . Hypertension 07/17/2016  . Status post placement of implantable loop recorder 07/17/2016  . AF (paroxysmal atrial fibrillation) ([place]) 07/17/2016  . Primary insomnia  07/17/2016  . History of prosthetic aortic valve 07/17/2016  . History of syncope 08/07/2014    Orientation RESPIRATION BLADDER Height & Weight     Self, Place, Time  Normal Incontinent Weight: 49 kg Height:  5\' 7"  (170.2 cm)  BEHAVIORAL SYMPTOMS/MOOD NEUROLOGICAL BOWEL NUTRITION STATUS      Continent Diet(Please see DC Summary)  AMBULATORY STATUS COMMUNICATION OF NEEDS Skin   Limited Assist Verbally Other (Comment)(Wound on leg)                       Personal Care Assistance Level of Assistance  Bathing, Feeding, Dressing Bathing Assistance: Maximum assistance Feeding assistance: Limited assistance Dressing Assistance: Limited assistance     Functional Limitations Info  Sight, Hearing, Speech Sight Info: Adequate Hearing Info: Adequate Speech Info: Adequate    SPECIAL CARE FACTORS FREQUENCY  PT (By licensed PT), OT (By licensed OT)     PT Frequency: 5x/week OT Frequency: 3x/week            Contractures Contractures Info: Not present    Additional Factors Info  Code Status, Allergies, Insulin Sliding Scale Code Status Info: DNR Allergies Info: Allergies:  Clonidine, Captopril, Codeine, Ampicillin, Ceclor Cefaclor   Insulin Sliding Scale Info: 3x daily with meals       Current Medications (02/04/2018):  This is the current hospital active medication list Current Facility-Administered Medications  Medication Dose Route Frequency Provider Last Rate Last Dose  . 0.9 %  sodium chloride infusion (Manually program via Guardrails IV Fluids)   Intravenous Once Blount, Scarlette Shorts T, NP      . 0.9 %  sodium chloride infusion   Intravenous PRN Bonnell Public, MD 10 mL/hr at 02/03/18  2120 200 mL at 02/03/18 2120  . albuterol (PROVENTIL) (2.5 MG/3ML) 0.083% nebulizer solution 2.5 mg  2.5 mg Nebulization Q2H PRN Jonetta Osgood, MD   2.5 mg at 02/02/18 0539  . amiodarone (PACERONE) tablet 100 mg  100 mg Oral Daily Jonetta Osgood, MD   100 mg at 02/04/18 1020  .  bisacodyl (DULCOLAX) EC tablet 5 mg  5 mg Oral Daily PRN Blount, Scarlette Shorts T, NP      . budesonide (PULMICORT) nebulizer solution 0.25 mg  0.25 mg Nebulization BID Dana Allan I, MD   0.25 mg at 02/04/18 0858  . cefTRIAXone (ROCEPHIN) 2 g in sodium chloride 0.9 % 100 mL IVPB  2 g Intravenous Q24H Bonnell Public, MD   Stopped at 02/03/18 2300  . feeding supplement (ENSURE ENLIVE) (ENSURE ENLIVE) liquid 237 mL  237 mL Oral BID BM Dana Allan I, MD   237 mL at 02/04/18 1439  . furosemide (LASIX) tablet 20 mg  20 mg Oral Daily Jonetta Osgood, MD   20 mg at 02/04/18 1020  . insulin aspart (novoLOG) injection 0-9 Units  0-9 Units Subcutaneous TID WC Dana Allan I, MD   5 Units at 02/04/18 1312  . ipratropium (ATROVENT) nebulizer solution 0.5 mg  0.5 mg Nebulization BID Thurnell Lose, MD   0.5 mg at 02/04/18 0858  . levalbuterol (XOPENEX) nebulizer solution 0.63 mg  0.63 mg Nebulization BID Thurnell Lose, MD   0.63 mg at 02/04/18 0858  . LORazepam (ATIVAN) injection 0.5 mg  0.5 mg Intravenous E3M PRN Pershing Proud, NP       Or  . LORazepam (ATIVAN) tablet 0.5 mg  0.5 mg Oral O2H PRN Pershing Proud, NP   0.5 mg at 02/03/18 2116  . methylPREDNISolone sodium succinate (SOLU-MEDROL) 40 mg/mL injection 40 mg  40 mg Intravenous Daily Thurnell Lose, MD   40 mg at 02/04/18 1020  . metoprolol tartrate (LOPRESSOR) injection 5 mg  5 mg Intravenous Q6H PRN Jonetta Osgood, MD   5 mg at 02/02/18 0515  . metoprolol tartrate (LOPRESSOR) tablet 50 mg  50 mg Oral Q8H Ghimire, Henreitta Leber, MD   50 mg at 02/04/18 1553  . morphine CONCENTRATE 10 MG/0.5ML oral solution 2.6 mg  2.6 mg Sublingual U7M PRN Pershing Proud, NP      . multivitamin with minerals tablet 1 tablet  1 tablet Oral Daily Kelton Pillar, MD   1 tablet at 02/04/18 1020  . Rivaroxaban (XARELTO) tablet 15 mg  15 mg Oral Q supper Dana Allan I, MD   15 mg at 02/03/18 1716  . [START ON 02/05/2018] senna (SENOKOT)  tablet 8.6 mg  1 tablet Oral QHS Vinie Sill C, NP      . sodium chloride flush (NS) 0.9 % injection 10-40 mL  10-40 mL Intracatheter PRN Thurnell Lose, MD         Discharge Medications: Please see discharge summary for a list of discharge medications.  Relevant Imaging Results:  Relevant Lab Results:   Additional Information SS#  [id number]  Benard Halsted, LCSW

## 2018-02-04 NOTE — Clinical Social Work Note (Signed)
Clinical Social Work Assessment  Patient Details  Name: Angela Hines MRN: [id number] Date of Birth: June 14, 1932  Date of referral:  02/04/18               Reason for consult:  Facility Placement                Permission sought to share information with:  Facility Sport and exercise psychologist, Family Supports Permission granted to share information::  Yes, Verbal Permission Granted  Name::     [place]ale Lay::  SNFs  Relationship::  Son  Contact Information:  [phone]  Housing/Transportation Living arrangements for the past 2 months:  Single Family Home Source of Information:  Patient, Adult Children Patient Interpreter Needed:  None Criminal Activity/Legal Involvement Pertinent to Current Situation/Hospitalization:  No - Comment as needed Significant Relationships:  Adult Children Lives with:  Adult Children Do you feel safe going back to the place where you live?  No Need for family participation in patient care:  Yes (Comment)  Care giving concerns:  CSW received consult for possible SNF placement at time of discharge. CSW spoke with patient and her sons regarding PT recommendation of SNF placement at time of discharge. Patient's son and daughter in law reported that they are currently unable to care for patient at their home given patient's current physical needs and fall risk. Patient expressed understanding of PT recommendation and is agreeable to SNF placement at time of discharge. CSW to continue to follow and assist with discharge planning needs.   Social Worker assessment / plan:  CSW spoke with patient concerning possibility of rehab at [facility] before returning home.  Employment status:  Retired Forensic scientist:  Medicare PT Recommendations:  [place] / Referral to community resources:  [place]  Patient/Family's Response to care:  Patient recognizes need for rehab before returning home and is agreeable to a SNF in [place]. Patient reported preference for [facility] since she has been there before. If they do not have a bed available, family is requesting [facility].   Patient/Family's Understanding of and Emotional Response to Diagnosis, Current Treatment, and Prognosis:  Patient/family is realistic regarding therapy needs and expressed being hopeful for SNF placement. Patient expressed understanding of CSW role and discharge process as well as medical condition. No questions/concerns about plan or treatment.    Emotional Assessment Appearance:  Appears stated age Attitude/Demeanor/Rapport:  Gracious Affect (typically observed):  Accepting, Appropriate Orientation:  Oriented to Self, Oriented to Place, Oriented to  Time Alcohol / Substance use:  Not Applicable Psych involvement (Current and /or in the community):  No (Comment)  Discharge Needs  Concerns to be addressed:  Care Coordination Readmission within the last 30 days:  No Current discharge risk:  None Barriers to Discharge:  Continued Medical Work up   [employer], LCSW 02/04/2018, 4:48 PM

## 2018-02-05 DIAGNOSIS — J9620 Acute and chronic respiratory failure, unspecified whether with hypoxia or hypercapnia: Secondary | ICD-10-CM

## 2018-02-05 LAB — HEMOGLOBIN AND HEMATOCRIT, BLOOD
HEMATOCRIT: 29.7 % — AB (ref 36.0–46.0)
HEMOGLOBIN: 8.6 g/dL — AB (ref 12.0–15.0)

## 2018-02-05 LAB — GLUCOSE, CAPILLARY
Glucose-Capillary: 97 mg/dL (ref 70–99)
Glucose-Capillary: 292 mg/dL — ABNORMAL HIGH (ref 70–99)

## 2018-02-05 LAB — COMPREHENSIVE METABOLIC PANEL
ALBUMIN: 2.7 g/dL — AB (ref 3.5–5.0)
ALK PHOS: 180 U/L — AB (ref 38–126)
ALT: 906 U/L — AB (ref 0–44)
AST: 303 U/L — AB (ref 15–41)
Anion gap: 9 (ref 5–15)
BUN: 30 mg/dL — AB (ref 8–23)
CO2: 24 mmol/L (ref 22–32)
CREATININE: 0.69 mg/dL (ref 0.44–1.00)
Calcium: 7.8 mg/dL — ABNORMAL LOW (ref 8.9–10.3)
Chloride: 103 mmol/L (ref 98–111)
GFR calc Af Amer: >60 mL/min (ref >60)
GFR calc non Af Amer: >60 mL/min (ref >60)
GLUCOSE: 137 mg/dL — AB (ref 70–99)
Potassium: 3.1 mmol/L — ABNORMAL LOW (ref 3.5–5.1)
Sodium: 136 mmol/L (ref 135–145)
TOTAL PROTEIN: 5.2 g/dL — AB (ref 6.5–8.1)
Total Bilirubin: 1.2 mg/dL (ref 0.3–1.2)

## 2018-02-05 LAB — CULTURE, BLOOD (ROUTINE X 2)
CULTURE: NO GROWTH
Culture: NO GROWTH

## 2018-02-05 LAB — BPAM RBC
BLOOD PRODUCT EXPIRATION DATE: 201910232359
ISSUE DATE / TIME: 201910140733
Unit Type and Rh: 5100

## 2018-02-05 LAB — TYPE AND SCREEN
ABO/RH(D): O POS
Antibody Screen: NEGATIVE
Unit division: 0

## 2018-02-05 MED ORDER — POTASSIUM CHLORIDE CRYS ER 20 MEQ PO TBCR
40.0000 meq | EXTENDED_RELEASE_TABLET | Freq: Once | ORAL | Status: AC
  Administered 2018-02-05: 40 meq via ORAL
  Filled 2018-02-05: qty 2

## 2018-02-05 MED ORDER — PREDNISONE 5 MG PO TABS: ORAL_TABLET | ORAL | 0 refills | Status: DC

## 2018-02-05 MED ORDER — CEPHALEXIN 500 MG PO CAPS: 500.0000 mg | ORAL_CAPSULE | Freq: Two times a day (BID) | ORAL | 0 refills | Status: AC

## 2018-02-05 MED ORDER — SODIUM CHLORIDE 0.9 % IV SOLN: 2.0000 g | INTRAVENOUS | Status: AC

## 2018-02-05 NOTE — Care Management Important Message (Signed)
Important Message  Patient Details  Name: Angela Hines MRN: [id number] Date of Birth: 12-02-1932   Medicare Important Message Given:  Yes    [name] 02/05/2018, 3:53 PM

## 2018-02-05 NOTE — Discharge Summary (Signed)
Angela Hines ELF:[id number] DOB: 05-04-1932 DOA: 01/31/2018  PCP: Kelton Pillar, MD  Admit date: 01/31/2018  Discharge date: 02/05/2018  Admitted From: Home  Disposition:  SNF with Pall Care/Hospice   Recommendations for Outpatient Follow-up:   Follow up with PCP in 1-2 weeks  PCP Please obtain BMP/CBC, 2 view CXR in 1week,  (see Discharge instructions)   PCP Please follow up on the following pending results:    Home Health: None   Equipment/Devices: None  Consultations: Cards, ID Discharge Condition: Fair   CODE STATUS: DNR   Diet Recommendation: Heart Healthy     Chief Complaint  Patient presents with  . Shortness of Breath     Brief history of present illness from the day of admission and additional interim summary    Patient is a 84 y.o. female with history of atrial fibrillation on anticoagulation, COPD, chronic combined systolic/diastolic heart failure, DM-2, bioprosthetic aortic valve replacement, numerous hospitalizations this year alone-presenting with acute hypoxic respiratory failure felt to be secondary to COPD exacerbation, further evaluation revealed strep viridans bacteremia.  See below for further details                                                                 Hospital Course   Acute hypoxic respiratory failure secondary to COPD exacerbation: liberated off BiPAP on 10/11 morning.  Much improved-moving air well with only scattered few rhonchi.  Has been placed on oral prednisone taper, continue supportive care with oxygen and nebulizer treatments as needed, currently on room air and in no distress.    Sepsis secondary to Strep viridans bacteremia: Sepsis pathophysiology has rapidly resolved.  Suspicion for endocarditis given bioprosthetic aortic valve in place.  In by ID,  awaiting transthoracic echocardiogram, TEE not done due to her advanced age, plan is at least 10 days of IV Rocephin we will place a midline is patient needs only 5 more days of IV antibiotics thereafter will be switched to oral Keflex indefinitely no stop date at SNF, TTE largely inconclusive, no TEE due to patient's age and frail underlying condition.  Goal of care is now comfort, no routine lab draws, focus on comfort with gentle medical treatment.  Atrial fibrillation with RVR: Initially required Cardizem infusion-this was titrated off on 10/11-goal is rate controlled currently on combination of oral amiodarone and Lopressor along with Xarelto for anticoagulation.  Mali vas 2 score is at least 4.  Rate control is suboptimal and blood pressures are low, will try 2 doses of IV digoxin and monitor rate .  Chronic combined systolic/diastolic heart failure: Compensated-volume status stable-continue diuretics.  Anemia: Anemia secondary to chronic disease-worsened by acute illness-follow for now.   She received 1 unit of packed RBC on 02/04/2018 with stable posttransfusion H&H, after this family wants full comfort measures only  and no further escalation in care.  No signs of ongoing active bleeding.  Failure to thrive syndrome/debility: Continue PT eval-DNR in place-has had numerous hospitalizations this year-long discussion with the patient's son at bedside yesterday-now to be discharged to SNF with palliative care for a week after she has finished her IV antibiotic course can be transitioned to hospice care, goal of care now is comfort only with gentle medical treatment, no heroics.  She is a DNR.  History of bioprosthetic AVR-with moderate aortic stenosis: See above regarding concerns of endocarditis given Streptococcus viridans bacteremia.  Severe malnutrition: Oral boost 3 times daily.    Discharge diagnosis     Active Problems:   History of prosthetic aortic valve   Sepsis ([place])    Respiratory distress   Goals of care, counseling/discussion   Palliative care encounter   Streptococcal bacteremia    Discharge instructions    Discharge Instructions    Diet - low sodium heart healthy   Complete by:  As directed    Discharge instructions   Complete by:  As directed    Follow with Primary MD Kelton Pillar, MD in 7 days   Activity: As tolerated with Full fall precautions use walker/cane & assistance as needed  Disposition Home   Diet: Heart Healthy with feeding assistance and aspiration precautions.    Special Instructions: If you have smoked or chewed Tobacco  in the last 2 yrs please stop smoking, stop any regular Alcohol  and or any Recreational drug use.  On your next visit with your primary care physician please Get Medicines reviewed and adjusted.  Please request your Prim.MD to go over all Hospital Tests and Procedure/Radiological results at the follow up, please get all Hospital records sent to your Prim MD by signing hospital release before you go home.  If you experience worsening of your admission symptoms, develop shortness of breath, life threatening emergency, suicidal or homicidal thoughts you must seek medical attention immediately by calling 911 or calling your MD immediately  if symptoms less severe.  You Must read complete instructions/literature along with all the possible adverse reactions/side effects for all the Medicines you take and that have been prescribed to you. Take any new Medicines after you have completely understood and accpet all the possible adverse reactions/side effects.   Increase activity slowly   Complete by:  As directed       Discharge Medications   Allergies as of 02/05/2018      Reactions   Clonidine Swelling, Other (See Comments)   "head swelling"   Captopril Swelling   Codeine Nausea And Vomiting, Other (See Comments)   GI Intolerance   Ampicillin Rash   Ceclor [cefaclor] Rash      Medication List      STOP taking these medications   ibuprofen 200 MG tablet Commonly known as:  ADVIL,MOTRIN     TAKE these medications   albuterol 108 (90 Base) MCG/ACT inhaler Commonly known as:  PROVENTIL HFA;VENTOLIN HFA Inhale 2 puffs into the lungs every 6 (six) hours as needed for shortness of breath.   amiodarone 100 MG tablet Commonly known as:  PACERONE Take 100 mg by mouth daily.   buPROPion 300 MG 24 hr tablet Commonly known as:  WELLBUTRIN XL Take 300 mg by mouth daily.   cefTRIAXone 2 g in sodium chloride 0.9 % 100 mL Inject 2 g into the vein daily for 4 days.   cephALEXin 500 MG capsule Commonly known as:  KEFLEX Take 1  capsule (500 mg total) by mouth 2 (two) times daily. No stop date Start taking on:  02/10/2018   feeding supplement (ENSURE ENLIVE) Liqd Take 237 mLs by mouth 3 (three) times daily between meals. What changed:  when to take this   furosemide 20 MG tablet Commonly known as:  LASIX Take 1 tablet (20 mg total) by mouth daily.   gabapentin 300 MG capsule Commonly known as:  NEURONTIN Take 600 mg by mouth at bedtime.   LORazepam 0.5 MG tablet Commonly known as:  ATIVAN Take 1 tablet (0.5 mg total) by mouth 3 (three) times daily as needed for anxiety. What changed:    when to take this  additional instructions   magnesium oxide 400 (241.3 Mg) MG tablet Commonly known as:  MAG-OX Take 1 tablet (400 mg total) by mouth daily.   metFORMIN 1000 MG tablet Commonly known as:  GLUCOPHAGE Take 1,000 mg by mouth daily.   metoprolol tartrate 25 MG tablet Commonly known as:  LOPRESSOR Take 12.5 mg by mouth 2 (two) times daily.   omeprazole 40 MG capsule Commonly known as:  PRILOSEC Take 40 mg by mouth daily.   polyethylene glycol packet Commonly known as:  MIRALAX / GLYCOLAX Take 17 g by mouth daily as needed for mild constipation.   polyethylene glycol packet Commonly known as:  MIRALAX / GLYCOLAX Take 17 g by mouth daily.   predniSONE 5 MG  tablet Commonly known as:  DELTASONE Label  & dispense according to the schedule below. 10 Pills PO for 3 days then, 8 Pills PO for 3 days, 6 Pills PO for 3 days, 4 Pills PO for 3 days, 2 Pills PO for 3 days, 1 Pills PO for 3 days, 1/2 Pill  PO for 3 days then STOP. Total 95 pills.   Rivaroxaban 15 MG Tabs tablet Commonly known as:  XARELTO Take 15 mg by mouth daily with supper.       Contact information for after-discharge care    Destination    [facility] Preferred SNF .   Service:  Skilled Nursing Contact information: [place] [phone]              Major procedures and Radiology Reports - PLEASE review detailed and final reports thoroughly  -     Transthoracic echocardiogram - Compared to a prior study in 05/2017, the LVEF is slightly higher at 45-50%. There is likely severe MR and moderate to severe TR.An aortic bioprosthesis is noted. The mean aortic gradient hasincreased from 19 mmHg to 29 mmHg, suggestive of possibleobstruction. Recommend TEE for closer evaluation given concern for endocarditis.  Dg Chest Port 1 View  Result Date: 01/31/2018 CLINICAL DATA:  Shortness of breath tonight. EXAM: PORTABLE CHEST 1 VIEW COMPARISON:  12/17/2017 FINDINGS: Postoperative changes in the mediastinum. Cardiac valve prosthesis. Mild cardiac enlargement. No pulmonary vascular congestion. Emphysematous changes are suggested in the lungs with perihilar streaky opacities and interstitial infiltrates in the lung bases suggesting chronic bronchitis and fibrosis. No focal consolidation. No blunting of costophrenic angles. No pneumothorax. Mediastinal contours appear intact. IMPRESSION: Emphysematous and chronic bronchitic changes in the lungs. No focal consolidation. Electronically Signed   By: Lucienne Capers M.D.   On: 01/31/2018 06:19   Dg Chest Port 1v Same Day  Result Date: 02/01/2018 CLINICAL DATA:  SOB Hx of  atrial fib, CHF, COPD, HTN, DM2 EXAM: PORTABLE CHEST 1 VIEW COMPARISON:  01/31/2018 FINDINGS: Status post median sternotomy. Valve replacement. Patient  has a loop recorder overlying the central chest. Lungs are mildly hyperinflated. There is prominence of interstitial markings, stable over multiple prior studies. Focal opacities are identified in the RIGHT UPPER lobe, LEFT LOWER lobe, RIGHT LOWER lobe, raising the question of acute superimposed infectious process. No pulmonary edema. IMPRESSION: Cardiomegaly and interstitial lung disease. Focal opacities in the RIGHT UPPER lobe and bilateral LOWER lobes may indicate acute infectious process or asymmetric pulmonary edema. Electronically Signed   By: Nolon Nations M.D.   On: 02/01/2018 09:15   [place] Ekg Site Rite  Result Date: 02/04/2018 If Site Rite image not attached, placement could not be confirmed due to current cardiac rhythm.  [place] Ekg Site Rite  Result Date: 02/03/2018 If Site Rite image not attached, placement could not be confirmed due to current cardiac rhythm.   Micro Results     Recent Results (from the past 240 hour(s))  Blood Culture (routine x 2)     Status: Abnormal   Collection Time: 01/31/18  6:54 AM  Result Value Ref Range Status   Specimen Description BLOOD RIGHT FOREARM  Final   Special Requests   Final    BOTTLES DRAWN AEROBIC AND ANAEROBIC Blood Culture results may not be optimal due to an inadequate volume of blood received in culture bottles   Culture  Setup Time   Final    GRAM POSITIVE COCCI IN CHAINS BLOOD ANAEROBIC BOTTLE CRITICAL RESULT CALLED TO, READ BACK BY AND VERIFIED WITH: N. JOHNSTON PHARM 01/31/18 2214 BEAMJ    Culture (A)  Final    VIRIDANS STREPTOCOCCUS THE SIGNIFICANCE OF ISOLATING THIS ORGANISM FROM A SINGLE SET OF BLOOD CULTURES WHEN MULTIPLE SETS ARE DRAWN IS UNCERTAIN. PLEASE NOTIFY THE MICROBIOLOGY DEPARTMENT WITHIN ONE WEEK IF SPECIATION AND SENSITIVITIES ARE REQUIRED. Performed at [facility], [place] [address] [place], [place] [address]    Report Status 02/02/2018 FINAL  Final  Blood Culture ID Panel (Reflexed)     Status: Abnormal   Collection Time: 01/31/18  6:54 AM  Result Value Ref Range Status   Enterococcus species NOT DETECTED NOT DETECTED Final   Listeria monocytogenes NOT DETECTED NOT DETECTED Final   Staphylococcus species NOT DETECTED NOT DETECTED Final   Staphylococcus aureus (BCID) NOT DETECTED NOT DETECTED Final   Streptococcus species DETECTED (A) NOT DETECTED Final    Comment: Not Enterococcus species, Streptococcus agalactiae, Streptococcus pyogenes, or Streptococcus pneumoniae. CRITICAL RESULT CALLED TO, READ BACK BY AND VERIFIED WITH: N. JOHNSTON PHARM 01/31/18 2214 BEAMJ    Streptococcus agalactiae NOT DETECTED NOT DETECTED Final   Streptococcus pneumoniae NOT DETECTED NOT DETECTED Final   Streptococcus pyogenes NOT DETECTED NOT DETECTED Final   Acinetobacter baumannii NOT DETECTED NOT DETECTED Final   Enterobacteriaceae species NOT DETECTED NOT DETECTED Final   Enterobacter cloacae complex NOT DETECTED NOT DETECTED Final   Escherichia coli NOT DETECTED NOT DETECTED Final   Klebsiella oxytoca NOT DETECTED NOT DETECTED Final   Klebsiella pneumoniae NOT DETECTED NOT DETECTED Final   Proteus species NOT DETECTED NOT DETECTED Final   Serratia marcescens NOT DETECTED NOT DETECTED Final   Haemophilus influenzae NOT DETECTED NOT DETECTED Final   Neisseria meningitidis NOT DETECTED NOT DETECTED Final   Pseudomonas aeruginosa NOT DETECTED NOT DETECTED Final   Candida albicans NOT DETECTED NOT DETECTED Final   Candida glabrata NOT DETECTED NOT DETECTED Final   Candida krusei NOT DETECTED NOT DETECTED Final   Candida parapsilosis NOT DETECTED NOT DETECTED Final   Candida tropicalis NOT DETECTED  NOT DETECTED Final    Comment: Performed at [facility], [place] [address], [place], [place] [address]  Blood Culture (routine x 2)     Status: None  (Preliminary result)   Collection Time: 01/31/18  7:39 AM  Result Value Ref Range Status   Specimen Description BLOOD LEFT WRIST  Final   Special Requests   Final    BOTTLES DRAWN AEROBIC AND ANAEROBIC Blood Culture results may not be optimal due to an inadequate volume of blood received in culture bottles   Culture   Final    NO GROWTH 3 DAYS Performed at [facility], [place] [address], [place], [place] [address]    Report Status PENDING  Incomplete  Urine culture     Status: None   Collection Time: 01/31/18  8:15 AM  Result Value Ref Range Status   Specimen Description URINE, CATHETERIZED  Final   Special Requests NONE  Final   Culture   Final    NO GROWTH Performed at [facility], [address], [place], [place] [address]    Report Status 02/01/2018 FINAL  Final  MRSA PCR Screening     Status: None   Collection Time: 01/31/18  1:59 PM  Result Value Ref Range Status   MRSA by PCR NEGATIVE NEGATIVE Final    Comment:        The GeneXpert MRSA Assay (FDA approved for NASAL specimens only), is one component of a comprehensive MRSA colonization surveillance program. It is not intended to diagnose MRSA infection nor to guide or monitor treatment for MRSA infections. Performed at [facility], [place] [address], [place], [place] [address]   Culture, blood (x 2)     Status: None (Preliminary result)   Collection Time: 01/31/18  2:44 PM  Result Value Ref Range Status   Specimen Description BLOOD RIGHT ANTECUBITAL  Final   Special Requests   Final    BOTTLES DRAWN AEROBIC ONLY Blood Culture results may not be optimal due to an inadequate volume of blood received in culture bottles   Culture   Final    NO GROWTH 3 DAYS Performed at [facility], [place] [address] [place], [place] [address]    Report Status PENDING  Incomplete    Today   Subjective    Angela Hines today has no headache,no chest abdominal pain,no new weakness tingling or numbness,  feels much better wants to go home today.    Objective   Blood pressure 127/67, pulse (!) 54, temperature 98.8 F (37.1 C), temperature source Oral, resp. rate 17, height 5\' 7"  (1.702 m), weight 49 kg, SpO2 99 %.  No intake or output data in the 24 hours ending 02/05/18 1033   Exam Awake Alert, Oriented x 3, No new F.N deficits, Normal affect .AT,PERRAL Supple Neck,No JVD, No cervical lymphadenopathy appriciated.  Symmetrical Chest wall movement, Good air movement bilaterally, CTAB RRR,No Gallops,Rubs or new Murmurs, No Parasternal Heave +ve B.Sounds, Abd Soft, Non tender, No organomegaly appriciated, No rebound -guarding or rigidity. No Cyanosis, Clubbing or edema, No new Rash or bruise   Data Review   CBC w Diff:  Lab Results  Component Value Date   WBC 14.3 (H) 02/04/2018   HGB 8.6 (L) 02/05/2018   HCT 29.7 (L) 02/05/2018   PLT 213 02/04/2018   LYMPHOPCT 2 01/31/2018   BANDSPCT 1 01/31/2018   MONOPCT 4 01/31/2018   EOSPCT 0 01/31/2018   BASOPCT 0 01/31/2018  CMP:  Lab Results  Component Value Date   NA 136 02/05/2018   K 3.1 (L) 02/05/2018   CL 103 02/05/2018   CO2 24 02/05/2018   BUN 30 (H) 02/05/2018   CREATININE 0.69 02/05/2018   PROT 5.2 (L) 02/05/2018   ALBUMIN 2.7 (L) 02/05/2018   BILITOT 1.2 02/05/2018   ALKPHOS 180 (H) 02/05/2018   AST 303 (H) 02/05/2018   ALT 906 (H) 02/05/2018  .   Total Time in preparing paper work, data evaluation and todays exam - 72 minutes  Lala Lund M.D on 02/05/2018 at 10:33 AM  [facility]   Office  [phone]

## 2018-02-05 NOTE — Progress Notes (Signed)
   02/05/18 1100  Clinical Encounter Type  Visited With Patient and family together  Visit Type Initial  Referral From Nurse  Stress Factors  Patient Stress Factors Other (Comment)  Family Stress Factors Other (Comment)   Responded to floor Nurse for follow up with PT. PT was alert and son was at bedside. PT was elated about the visit and was happy about getting discharged soon. PT and son stated they were very happy with the care received and was thankful for the Chaplain visit. I offered spiritual support with ministry of presence and words of encouragement.  Chaplain Fidel Levy  [phone]

## 2018-02-05 NOTE — Clinical Social Work Note (Signed)
Clinical Social Worker facilitated patient discharge including contacting patient family and facility to confirm patient discharge plans.  Clinical information faxed to facility and family agreeable with plan.  CSW arranged ambulance transport via [facility] (2:00 PM)  to [facility] (room 3209) .  RN to call [phone] for report prior to discharge.  Per facility request please be sure that pt has had lunch.  Clinical Social Worker will sign off for now as social work intervention is no longer needed. Please consult [place] again if new need arises.  [place], [place]

## 2018-02-05 NOTE — Progress Notes (Signed)
Pt discharged at this time to [facility]. SBAR given to Skilled Nursing Unit  RN. VS stable. Pt denied pain. All personal belongings packed and accompanied Pt. Pt transferred via [facility]. Discharge instructions given to [facility].

## 2018-02-05 NOTE — Progress Notes (Signed)
Report call to accepting facility, [facility], at this time. Discharge instructions printed. Pt awaiting discharge. [facility] to transport Pt at 1400.

## 2018-02-05 NOTE — Progress Notes (Signed)
Palliative:  I met today at Angela Hines's bedside along with her son, Angela Hines. I have now had the chance to speak with all her sons. He confirms plan for IV antibiotics for bacteremia at SNF and then likely transition to hospice care (hopefully at ALF) d/t chronic COPD and CHF. He has no questions/concerns. Angela Hines is in good spirits but continues to have poor insight into her declining health and medical issues.   Exam: Alert, baseline confusion to situation. No distress.   20 min  Vinie Sill, NP Palliative Medicine Team Pager # [phone] (M-F 8a-5p) Team Phone # [phone] (Nights/Weekends)

## 2018-02-05 NOTE — Clinical Social Work Placement (Signed)
   CLINICAL SOCIAL WORK PLACEMENT  NOTE  Date:  02/05/2018  Patient Details  Name: Angela Hines MRN: [id number] Date of Birth: 1932/10/22  Clinical Social Work is seeking post-discharge placement for this patient at the [place] level of care (*CSW will initial, date and re-position this form in  chart as items are completed):  Yes   Patient/family provided with [facility] Work Department's list of facilities offering this level of care within the geographic area requested by the patient (or if unable, by the patient's family).  Yes   Patient/family informed of their freedom to choose among providers that offer the needed level of care, that participate in Medicare, Medicaid or managed care program needed by the patient, have an available bed and are willing to accept the patient.  Yes   Patient/family informed of [facility]'s ownership interest in [facility] and [facility], as well as of the fact that they are under no obligation to receive care at these facilities.  PASRR submitted to EDS on       PASRR number received on       Existing PASRR number confirmed on 02/04/18     FL2 transmitted to all facilities in geographic area requested by pt/family on 02/04/18     FL2 transmitted to all facilities within larger geographic area on       Patient informed that his/her managed care company has contracts with or will negotiate with certain facilities, including the following:        Yes   Patient/family informed of bed offers received.  Patient chooses bed at [facility]     Physician recommends and patient chooses bed at      Patient to be transferred to [facility] on 02/05/18.  Patient to be transferred to facility by [facility]     Patient family notified on 02/05/18 of transfer.  Name of family member notified:  Legrand Como     PHYSICIAN Please sign FL2     Additional Comment:     _______________________________________________ Eileen Stanford, LCSW 02/05/2018, 10:08 AM

## 2018-02-06 ENCOUNTER — Non-Acute Institutional Stay: Payer: Medicare Other | Admitting: Internal Medicine

## 2018-02-06 VITALS — BP 94/70 | HR 60 | Resp 16 | Ht 67.0 in | Wt 116.0 lb

## 2018-02-07 ENCOUNTER — Telehealth: Payer: Self-pay | Admitting: Cardiology

## 2018-02-07 NOTE — Telephone Encounter (Signed)
LMOVM requesting that pt send manual transmission b/c home monitor has not updated in at least 14 days.    

## 2018-02-08 ENCOUNTER — Telehealth: Payer: Self-pay | Admitting: Cardiology

## 2018-02-08 LAB — CULTURE, BLOOD (ROUTINE X 2)
CULTURE: NO GROWTH
Special Requests: ADEQUATE

## 2018-02-08 NOTE — Telephone Encounter (Signed)
Patient in hospital 10/10- 10/15. Per Telephone note 10/18 patient is now in [facility] and patient's daughter is to set up monitor at the facility.

## 2018-02-08 NOTE — Telephone Encounter (Signed)
Spoke w/ pt daughter and explained how the monitor works and how they do not need a land line phone. Pt daughter in law stated that they will probably take the monitor to the facility.

## 2018-02-08 NOTE — Telephone Encounter (Signed)
-----   Message from Juluis Mire, RN sent at 02/07/2018  3:45 PM EDT ----- Regarding: linq monitoring Pts daughter in law hattie called stating pt has been in hospital and is now at [facility] facility. They would like a call back on how to set up transmitter box at nursing facility since theres not a phone jack in her patient room. Please call hattie back at [phone] to assist. Thanks! Marzetta Board

## 2018-02-12 NOTE — Telephone Encounter (Signed)
Persistent AF noted since ~01/30/18. Patient was d/c from hospital under palliative care, plan for possible hospice care per discharge summary. Reviewed with Dr. Rayann Heman, who advised continuing to monitor for now. Plan to discuss discontinuing Carelink monitoring with patient's family if patient is receiving hospice care.  Spoke with patient's son (DPR). He reports that patient has had a remarkable turnaround since her hospital d/c. She is actively working with rehab. She still feels worn out and doesn't have much stamina, but CXR done yesterday at [facility] showed improvement per son. Discussed options to either continue monitoring remotely via Carelink, or to discontinue monitoring. Patient's son wishes to continue monitoring for now as patient has improved so much. He reports that patient is comfortable currently. Plan for f/u with Dr. Johnsie Cancel on 03/14/18 as scheduled. Patient's son will call in the interim if needed. He is appreciative of call and denies questions or concerns at this time.

## 2018-02-14 ENCOUNTER — Telehealth: Payer: Self-pay | Admitting: *Deleted

## 2018-02-14 DIAGNOSIS — I480 Paroxysmal atrial fibrillation: Secondary | ICD-10-CM

## 2018-02-14 DIAGNOSIS — I4819 Other persistent atrial fibrillation: Secondary | ICD-10-CM

## 2018-02-14 MED ORDER — METOPROLOL TARTRATE 25 MG PO TABS: 25.0000 mg | ORAL_TABLET | Freq: Two times a day (BID) | ORAL | Status: AC

## 2018-02-14 NOTE — Telephone Encounter (Signed)
Spoke with Lenna Sciara, unit nurse. She is aware of order and confirmed fax number. She will keep patient on vital signs every shift (vs every 24hrs) for the next few days to ensure patient's BP is stable on increased dose.  Order faxed to [facility], confirmation received. Med list updated.  Spoke with patient's son, updated him about change. He is appreciative of call and denies additional questions or concerns at this time.

## 2018-02-14 NOTE — Telephone Encounter (Signed)
Received alert from patient's LINQ regarding elevated V rates during AF. Avg V rates ~110s-120s per trend, histograms suggest rates up to 150bpm. Avg day/night HR trend also suggests elevated HRs compared with last month--suspect some under-detection of AF. Presenting rhythm today at 0450 shows AF, V rate ~100bpm.  Spoke with [place], patient's nurse at [facility] rehab, regarding any symptoms. She states that patient has not mentioned any symptoms. Still taking amiodarone 100mg  daily, metoprolol tartrate 12.5mg  BID, and Xarelto 15mg  daily. BPs mostly running 120s/70s, lowest recent BP was 102/76. Advised that I will send information to Dr. Rayann Heman for review. She requests faxed order be sent to [phone] if any new recommendations.

## 2018-02-14 NOTE — Telephone Encounter (Signed)
Lets try to see if we can increase metoprolol to 25mg  BID if blood pressure allows.  [name] MD, [facility] 02/14/2018 3:20 PM

## 2018-02-15 ENCOUNTER — Encounter: Payer: Self-pay | Admitting: Internal Medicine

## 2018-02-15 ENCOUNTER — Other Ambulatory Visit: Payer: Self-pay | Admitting: Internal Medicine

## 2018-02-15 ENCOUNTER — Non-Acute Institutional Stay: Payer: Medicare Other | Admitting: Internal Medicine

## 2018-02-15 DIAGNOSIS — Z515 Encounter for palliative care: Secondary | ICD-10-CM

## 2018-02-15 NOTE — Progress Notes (Signed)
PALLIATIVE CARE CONSULT VISIT   PATIENT NAME: Angela Hines DOB: 05-25-1932 MRN: [id number]  PRIMARY CARE PROVIDER:   Kelton Pillar, MD  REFERRING PROVIDER:Jenna Gasquet PA [phone] FAMILY: (son) Angela Hines [phone], (son lives 3 hours away in [place] but currently staying in patient's apartment to assist with care) Angela Hines [phone], (son) Angela Hines [phone] lives locally  in [place]. Angela Hines and Angela Hines.     RECOMMENDATIONS and PLAN:  1. Weight loss: Improved; showing significant weight gain this last month though BMI still low at 18.4kg/m2 (improved from 14. May benefit from appetite stimulant such as Remeron 7.5mg  qd.  2. Weakness and debilitation; recovery from sepsis: Ongoing facility rehab.  3. Advanced Care Planning:  Has DNR at home. MOST form: DNR, Limited scope of medical care, Yes to IVFs, Yes to Antibiotics. Tube feedings to be determined at the time.  4. Goals of Care: To return to her apartment at [facility] after rehab. To see her children healthy and happy. Hoping to survive long enough to see her two granddaughters marry in June and July of 2020.  5. Follow up: [facility] NP visit next few weeks. I spent 75 minutes providing this consultation (from 10am to 11:15am). More than 50% of that time was spent coordinating communication.   HISTORY OF PRESENT ILLNESS:  HPI: [name] is an 84 yo female with history significant for PAF Angela Hines), CHF, COPD, generalized muscular weakness, DM type 2, bioprosthetic (cow) aortic valve, pacemaker (05/2014), HTN, anemia (transfused 1 unit PRBCs 08/2017 for hgb 6.6) protein-calorie malnutrition, anxiety, depression, HTN, and insomnia.  She had a recent hospitalization (10/10-10/15/2019) with sepsis thought r/t endocarditis. No TEE due to her age, frail state, and preference for a more gentle level of medical care without invasive studies. She was discharged to rehab to complete a 10 day course of IV  Rocephin via PIC line, with plan for continuation of oral Keflex thereafter. She was referred to Palliative Care for ongoing discussions regarding goals of care/advanced directives.   CODE STATUS: DNR. Limited scope of medical care (no intubation, avoid ICU).  PPS: ROS: PPS: weak 40%. She has h/o episodic lightheadedness which may be multifactorial (cardiac dysrhythmias [prior loop recorder removed 08/2017], orthostatic hypotension). She has frequent falls over the last 6 months and is somewhat uncertain of the precipitating events, though most seem r/t tripping incidents, slippages, loss of balance, or hypotension r/t dehydration. Her falls have resulted in rib fractures, a forehead hematoma, skin tears, and LE cuts requiring stitches. She started working with facility PT yesterday. She is a currently a one person assist to stand, and uses a rolling walker to ambulate. At home she was using the walker only about 50% of the time as she would forget to utilize it.  She has had 2 hospitalizations at the beginning of this year for COPD exacerbations/CHF/possible pneumonia. She has had home O2 prn in the past. She has a h/o constipation with past ER visit (12/2017) needing SMOG enema. She was treated for a UTI about 2 months ago; no current symptoms. She states she has a good appetite, but with early satiety. Often skips breakfast and eats a few bites to 100% of meals. Her current weight (facility) is 116lbs, up 24 lbs over the last 2 months. Height 5'7", BMI is 18 kg/m2 (from 14.2kg/m2).   HOSPICE ELIGIBILITY/DIAGNOSIS: To be determined. Currently son and patient state they would wish full scope of medical care if interventions needed. However, they both  recognize her current frail state of health, and that relatively conservative current treatment (IV antibiotics; not aggressively seeking etiology; suspected endocarditis) for her recent sepsis.  PAST MEDICAL HISTORY:  Past Medical History:  Diagnosis Date    . Anginal pain ([place]) ~ 2007   "mini stroke affected her right eye; made it droop; fully recovered" (01/31/2018)  . Anxiety   . Aortic stenosis   . Arthritis    "joints" (01/31/2018)  . Atrial fibrillation ([place])   . CHF (congestive heart failure) ([place])   . Chronic bronchitis ([place])   . COPD (chronic obstructive pulmonary disease) ([place])   . Depressed affect   . Depression   . GERD (gastroesophageal reflux disease)   . Headache    "monthly" (01/31/2018)  . High cholesterol   . Hypertension   . Migraine    "none since <1990" (01/31/2018)  . Paroxysmal atrial fibrillation ([facility])   . Pneumonia 2019  . Type II diabetes mellitus ([place])    "recently dc'd Metformin" (01/31/2018)    SOCIAL HX:  Social History   Tobacco Use  . Smoking status: Never Smoker  . Smokeless tobacco: Never Used  Substance Use Topics  . Alcohol use: Not Currently    Frequency: Never    Comment: 01/31/2018 "did drink ~ 1 glass of wine/wk; nothing in the last 6 months or more"    ALLERGIES:  Allergies  Allergen Reactions  . Clonidine Swelling and Other (See Comments)    "head swelling"  . Captopril Swelling  . Codeine Nausea And Vomiting and Other (See Comments)    GI Intolerance   . Ampicillin Rash  . Ceclor [Cefaclor] Rash     PERTINENT MEDICATIONS:  Outpatient Encounter Medications as of 02/06/2018  Medication Sig  . albuterol (PROVENTIL HFA;VENTOLIN HFA) 108 (90 Base) MCG/ACT inhaler Inhale 2 puffs into the lungs every 6 (six) hours as needed for shortness of breath.  Marland Kitchen amiodarone (PACERONE) 100 MG tablet Take 100 mg by mouth daily.  Marland Kitchen buPROPion (WELLBUTRIN XL) 300 MG 24 hr tablet Take 300 mg by mouth daily.   . [EXPIRED] cefTRIAXone 2 g in sodium chloride 0.9 % 100 mL Inject 2 g into the vein daily for 4 days.  . cephALEXin (KEFLEX) 500 MG capsule Take 1 capsule (500 mg total) by mouth 2 (two) times daily. No stop date  . feeding supplement, ENSURE ENLIVE, (ENSURE ENLIVE) LIQD Take 237 mLs by  mouth 3 (three) times daily between meals. (Patient taking differently: Take 237 mLs by mouth 2 (two) times daily between meals. )  . furosemide (LASIX) 20 MG tablet Take 1 tablet (20 mg total) by mouth daily.  Marland Kitchen gabapentin (NEURONTIN) 300 MG capsule Take 600 mg by mouth at bedtime.   Marland Kitchen LORazepam (ATIVAN) 0.5 MG tablet Take 1 tablet (0.5 mg total) by mouth 3 (three) times daily as needed for anxiety. (Patient taking differently: Take 0.5 mg by mouth 3 (three) times daily. 1 tablet in the morning, 1 tablet in the evening and 2 tablets every night at bedtime)  . magnesium oxide (MAG-OX) 400 (241.3 Mg) MG tablet Take 1 tablet (400 mg total) by mouth daily.  . metFORMIN (GLUCOPHAGE) 1000 MG tablet Take 1,000 mg by mouth daily.  Marland Kitchen omeprazole (PRILOSEC) 40 MG capsule Take 40 mg by mouth daily.   . polyethylene glycol (MIRALAX / GLYCOLAX) packet Take 17 g by mouth daily as needed for mild constipation.  . polyethylene glycol (MIRALAX / GLYCOLAX) packet Take 17 g by mouth daily. (Patient  not taking: Reported on 01/31/2018)  . predniSONE (DELTASONE) 5 MG tablet Label  & dispense according to the schedule below. 10 Pills PO for 3 days then, 8 Pills PO for 3 days, 6 Pills PO for 3 days, 4 Pills PO for 3 days, 2 Pills PO for 3 days, 1 Pills PO for 3 days, 1/2 Pill  PO for 3 days then STOP. Total 95 pills.  . Rivaroxaban (XARELTO) 15 MG TABS tablet Take 15 mg by mouth daily with supper.  . [DISCONTINUED] metoprolol tartrate (LOPRESSOR) 25 MG tablet Take 12.5 mg by mouth 2 (two) times daily.   No facility-administered encounter medications on file as of 02/06/2018.     PHYSICAL EXAM:  Very pleasant, fatigued appearing elderly Caucasian female lying on her right side in bed. She is alert and conversant. Keeps eyes closed much of the interview. Her son Angela Hines is present and gives the majority of the information. VS:BP  94/70, Sats room air 98%, HR 60, RR 16 General: NAD, frail appearing, thin Cardiovascular:  regular rate and rhythm Pulmonary: clear ant fields Abdomen: soft, nontender, + bowel sounds Extremities: no edema, no joint deformities. Right UE PICC line Skin: no rashes Neurological: Weakness but otherwise nonfocal  Julianne Handler, NP

## 2018-02-15 NOTE — Progress Notes (Signed)
Patient was at therapy at time of my visit.  Weight reported as 118 lbs on 02/13/2018, which is up 2 lbs over the last week.  Physical Therapy reports patient is progressing well with 02 sats maintaining mid 90's with and without activity. She needs cuing for safety, with use of Rolator walker (remember to lock brakes/reach back/watch for obstacles). PT believes she would be safer with standard walker.  Violeta Gelinas NP-C

## 2018-02-18 ENCOUNTER — Other Ambulatory Visit: Payer: Self-pay

## 2018-02-18 ENCOUNTER — Emergency Department (HOSPITAL_COMMUNITY): Payer: Medicare Other

## 2018-02-18 ENCOUNTER — Encounter (HOSPITAL_COMMUNITY): Payer: Self-pay

## 2018-02-18 ENCOUNTER — Inpatient Hospital Stay (HOSPITAL_COMMUNITY)
Admission: EM | Admit: 2018-02-18 | Discharge: 2018-02-24 | DRG: 871 | Payer: Medicare Other | Attending: Internal Medicine | Admitting: Internal Medicine

## 2018-02-18 DIAGNOSIS — Z79899 Other long term (current) drug therapy: Secondary | ICD-10-CM

## 2018-02-18 DIAGNOSIS — E440 Moderate protein-calorie malnutrition: Secondary | ICD-10-CM | POA: Diagnosis not present

## 2018-02-18 DIAGNOSIS — Z881 Allergy status to other antibiotic agents status: Secondary | ICD-10-CM

## 2018-02-18 DIAGNOSIS — I5042 Chronic combined systolic (congestive) and diastolic (congestive) heart failure: Secondary | ICD-10-CM | POA: Diagnosis present

## 2018-02-18 DIAGNOSIS — R627 Adult failure to thrive: Secondary | ICD-10-CM | POA: Diagnosis present

## 2018-02-18 DIAGNOSIS — Z9842 Cataract extraction status, left eye: Secondary | ICD-10-CM

## 2018-02-18 DIAGNOSIS — Z8249 Family history of ischemic heart disease and other diseases of the circulatory system: Secondary | ICD-10-CM

## 2018-02-18 DIAGNOSIS — J9620 Acute and chronic respiratory failure, unspecified whether with hypoxia or hypercapnia: Secondary | ICD-10-CM | POA: Diagnosis present

## 2018-02-18 DIAGNOSIS — K7200 Acute and subacute hepatic failure without coma: Secondary | ICD-10-CM | POA: Diagnosis present

## 2018-02-18 DIAGNOSIS — R740 Nonspecific elevation of levels of transaminase and lactic acid dehydrogenase [LDH]: Secondary | ICD-10-CM | POA: Diagnosis present

## 2018-02-18 DIAGNOSIS — Z950 Presence of cardiac pacemaker: Secondary | ICD-10-CM

## 2018-02-18 DIAGNOSIS — E7800 Pure hypercholesterolemia, unspecified: Secondary | ICD-10-CM | POA: Diagnosis present

## 2018-02-18 DIAGNOSIS — K759 Inflammatory liver disease, unspecified: Secondary | ICD-10-CM | POA: Diagnosis not present

## 2018-02-18 DIAGNOSIS — Z515 Encounter for palliative care: Secondary | ICD-10-CM | POA: Diagnosis not present

## 2018-02-18 DIAGNOSIS — Z794 Long term (current) use of insulin: Secondary | ICD-10-CM

## 2018-02-18 DIAGNOSIS — Z7901 Long term (current) use of anticoagulants: Secondary | ICD-10-CM

## 2018-02-18 DIAGNOSIS — E11649 Type 2 diabetes mellitus with hypoglycemia without coma: Secondary | ICD-10-CM | POA: Diagnosis not present

## 2018-02-18 DIAGNOSIS — I482 Chronic atrial fibrillation, unspecified: Secondary | ICD-10-CM | POA: Diagnosis present

## 2018-02-18 DIAGNOSIS — Z66 Do not resuscitate: Secondary | ICD-10-CM | POA: Diagnosis present

## 2018-02-18 DIAGNOSIS — Z885 Allergy status to narcotic agent status: Secondary | ICD-10-CM

## 2018-02-18 DIAGNOSIS — B179 Acute viral hepatitis, unspecified: Secondary | ICD-10-CM | POA: Diagnosis present

## 2018-02-18 DIAGNOSIS — Z952 Presence of prosthetic heart valve: Secondary | ICD-10-CM

## 2018-02-18 DIAGNOSIS — I10 Essential (primary) hypertension: Secondary | ICD-10-CM | POA: Diagnosis not present

## 2018-02-18 DIAGNOSIS — E119 Type 2 diabetes mellitus without complications: Secondary | ICD-10-CM

## 2018-02-18 DIAGNOSIS — R0609 Other forms of dyspnea: Secondary | ICD-10-CM | POA: Diagnosis not present

## 2018-02-18 DIAGNOSIS — R64 Cachexia: Secondary | ICD-10-CM | POA: Diagnosis present

## 2018-02-18 DIAGNOSIS — R6520 Severe sepsis without septic shock: Secondary | ICD-10-CM | POA: Diagnosis not present

## 2018-02-18 DIAGNOSIS — K219 Gastro-esophageal reflux disease without esophagitis: Secondary | ICD-10-CM | POA: Diagnosis not present

## 2018-02-18 DIAGNOSIS — I110 Hypertensive heart disease with heart failure: Secondary | ICD-10-CM | POA: Diagnosis present

## 2018-02-18 DIAGNOSIS — E860 Dehydration: Secondary | ICD-10-CM | POA: Diagnosis not present

## 2018-02-18 DIAGNOSIS — F329 Major depressive disorder, single episode, unspecified: Secondary | ICD-10-CM | POA: Diagnosis present

## 2018-02-18 DIAGNOSIS — J42 Unspecified chronic bronchitis: Secondary | ICD-10-CM | POA: Diagnosis not present

## 2018-02-18 DIAGNOSIS — G9341 Metabolic encephalopathy: Secondary | ICD-10-CM | POA: Diagnosis present

## 2018-02-18 DIAGNOSIS — Z681 Body mass index (BMI) 19 or less, adult: Secondary | ICD-10-CM | POA: Diagnosis not present

## 2018-02-18 DIAGNOSIS — E43 Unspecified severe protein-calorie malnutrition: Secondary | ICD-10-CM | POA: Diagnosis present

## 2018-02-18 DIAGNOSIS — F418 Other specified anxiety disorders: Secondary | ICD-10-CM | POA: Diagnosis present

## 2018-02-18 DIAGNOSIS — I959 Hypotension, unspecified: Secondary | ICD-10-CM | POA: Diagnosis present

## 2018-02-18 DIAGNOSIS — I480 Paroxysmal atrial fibrillation: Secondary | ICD-10-CM | POA: Diagnosis present

## 2018-02-18 DIAGNOSIS — Z7984 Long term (current) use of oral hypoglycemic drugs: Secondary | ICD-10-CM

## 2018-02-18 DIAGNOSIS — A400 Sepsis due to streptococcus, group A: Secondary | ICD-10-CM | POA: Diagnosis not present

## 2018-02-18 DIAGNOSIS — Z823 Family history of stroke: Secondary | ICD-10-CM

## 2018-02-18 DIAGNOSIS — F419 Anxiety disorder, unspecified: Secondary | ICD-10-CM | POA: Diagnosis present

## 2018-02-18 DIAGNOSIS — Z9841 Cataract extraction status, right eye: Secondary | ICD-10-CM

## 2018-02-18 DIAGNOSIS — J449 Chronic obstructive pulmonary disease, unspecified: Secondary | ICD-10-CM | POA: Diagnosis present

## 2018-02-18 DIAGNOSIS — R0600 Dyspnea, unspecified: Secondary | ICD-10-CM

## 2018-02-18 DIAGNOSIS — D689 Coagulation defect, unspecified: Secondary | ICD-10-CM | POA: Diagnosis present

## 2018-02-18 DIAGNOSIS — I083 Combined rheumatic disorders of mitral, aortic and tricuspid valves: Secondary | ICD-10-CM | POA: Diagnosis present

## 2018-02-18 DIAGNOSIS — Z8673 Personal history of transient ischemic attack (TIA), and cerebral infarction without residual deficits: Secondary | ICD-10-CM

## 2018-02-18 DIAGNOSIS — Z888 Allergy status to other drugs, medicaments and biological substances status: Secondary | ICD-10-CM

## 2018-02-18 DIAGNOSIS — A419 Sepsis, unspecified organism: Secondary | ICD-10-CM | POA: Diagnosis present

## 2018-02-18 DIAGNOSIS — Z9049 Acquired absence of other specified parts of digestive tract: Secondary | ICD-10-CM

## 2018-02-18 DIAGNOSIS — Z833 Family history of diabetes mellitus: Secondary | ICD-10-CM

## 2018-02-18 LAB — I-STAT ARTERIAL BLOOD GAS, ED
Acid-base deficit: 4 mmol/L — ABNORMAL HIGH (ref 0.0–2.0)
Bicarbonate: 21.5 mmol/L (ref 20.0–28.0)
O2 Saturation: 100 %
PCO2 ART: 38.9 mmHg (ref 32.0–48.0)
PH ART: 7.351 (ref 7.350–7.450)
PO2 ART: 175 mmHg — AB (ref 83.0–108.0)
Patient temperature: 98.6
TCO2: 23 mmol/L (ref 22–32)

## 2018-02-18 LAB — COMPREHENSIVE METABOLIC PANEL
ALBUMIN: 3 g/dL — AB (ref 3.5–5.0)
ALK PHOS: 134 U/L — AB (ref 38–126)
ALT: 1132 U/L — ABNORMAL HIGH (ref 0–44)
AST: 949 U/L — ABNORMAL HIGH (ref 15–41)
Anion gap: 12 (ref 5–15)
BUN: 23 mg/dL (ref 8–23)
CHLORIDE: 104 mmol/L (ref 98–111)
CO2: 24 mmol/L (ref 22–32)
CREATININE: 0.87 mg/dL (ref 0.44–1.00)
Calcium: 8.5 mg/dL — ABNORMAL LOW (ref 8.9–10.3)
GFR calc non Af Amer: 60 mL/min — ABNORMAL LOW (ref >60)
Glucose, Bld: 60 mg/dL — ABNORMAL LOW (ref 70–99)
Potassium: 4.8 mmol/L (ref 3.5–5.1)
SODIUM: 140 mmol/L (ref 135–145)
Total Bilirubin: 2.1 mg/dL — ABNORMAL HIGH (ref 0.3–1.2)
Total Protein: 5.8 g/dL — ABNORMAL LOW (ref 6.5–8.1)

## 2018-02-18 LAB — I-STAT CG4 LACTIC ACID, ED
LACTIC ACID, VENOUS: 4.18 mmol/L — AB (ref 0.5–1.9)
Lactic Acid, Venous: 5.85 mmol/L (ref 0.5–1.9)

## 2018-02-18 LAB — CBC WITH DIFFERENTIAL/PLATELET
ABS IMMATURE GRANULOCYTES: 0.38 10*3/uL — AB (ref 0.00–0.07)
BASOS ABS: 0 10*3/uL (ref 0.0–0.1)
Basophils Relative: 0 %
EOS PCT: 0 %
Eosinophils Absolute: 0 10*3/uL (ref 0.0–0.5)
HEMATOCRIT: 31.8 % — AB (ref 36.0–46.0)
Hemoglobin: 8.3 g/dL — ABNORMAL LOW (ref 12.0–15.0)
Immature Granulocytes: 2 %
LYMPHS ABS: 1.3 10*3/uL (ref 0.7–4.0)
LYMPHS PCT: 7 %
MCH: 23.2 pg — ABNORMAL LOW (ref 26.0–34.0)
MCHC: 26.1 g/dL — ABNORMAL LOW (ref 30.0–36.0)
MCV: 89.1 fL (ref 80.0–100.0)
Monocytes Absolute: 1.3 10*3/uL — ABNORMAL HIGH (ref 0.1–1.0)
Monocytes Relative: 7 %
NRBC: 0 % (ref 0.0–0.2)
Neutro Abs: 16.7 10*3/uL — ABNORMAL HIGH (ref 1.7–7.7)
Neutrophils Relative %: 84 %
Platelets: 229 10*3/uL (ref 150–400)
RBC: 3.57 MIL/uL — ABNORMAL LOW (ref 3.87–5.11)
RDW: 20.8 % — AB (ref 11.5–15.5)
WBC: 19.8 10*3/uL — ABNORMAL HIGH (ref 4.0–10.5)

## 2018-02-18 LAB — URINALYSIS, ROUTINE W REFLEX MICROSCOPIC
Bilirubin Urine: NEGATIVE
Glucose, UA: NEGATIVE mg/dL
Hgb urine dipstick: NEGATIVE
KETONES UR: NEGATIVE mg/dL
LEUKOCYTES UA: NEGATIVE
NITRITE: NEGATIVE
PH: 7 (ref 5.0–8.0)
PROTEIN: NEGATIVE mg/dL
Specific Gravity, Urine: 1.01 (ref 1.005–1.030)

## 2018-02-18 LAB — PROTIME-INR
INR: 2.22
PROTHROMBIN TIME: 24.3 s — AB (ref 11.4–15.2)

## 2018-02-18 MED ORDER — RIVAROXABAN 15 MG PO TABS
15.0000 mg | ORAL_TABLET | Freq: Every day | ORAL | Status: DC
  Filled 2018-02-18: qty 1

## 2018-02-18 MED ORDER — SODIUM CHLORIDE 0.9 % IV BOLUS (SEPSIS)
1000.0000 mL | Freq: Once | INTRAVENOUS | Status: AC
  Administered 2018-02-18: 1000 mL via INTRAVENOUS

## 2018-02-18 MED ORDER — LORAZEPAM 2 MG/ML IJ SOLN
0.5000 mg | Freq: Once | INTRAMUSCULAR | Status: AC
  Administered 2018-02-18: 0.5 mg via INTRAVENOUS
  Filled 2018-02-18: qty 1

## 2018-02-18 MED ORDER — SODIUM CHLORIDE 0.9 % IV SOLN
INTRAVENOUS | Status: DC
  Administered 2018-02-18: 21:00:00 via INTRAVENOUS

## 2018-02-18 MED ORDER — SODIUM CHLORIDE 0.9 % IV SOLN
2.0000 g | Freq: Once | INTRAVENOUS | Status: AC
  Administered 2018-02-18: 2 g via INTRAVENOUS
  Filled 2018-02-18: qty 2

## 2018-02-18 MED ORDER — BUPROPION HCL ER (XL) 150 MG PO TB24
300.0000 mg | ORAL_TABLET | Freq: Every day | ORAL | Status: DC
  Administered 2018-02-19 – 2018-02-21 (×3): 300 mg via ORAL
  Filled 2018-02-18 (×3): qty 2

## 2018-02-18 MED ORDER — GABAPENTIN 300 MG PO CAPS: 600.0000 mg | ORAL_CAPSULE | Freq: Every day | ORAL | Status: DC

## 2018-02-18 MED ORDER — AMIODARONE HCL 100 MG PO TABS
100.0000 mg | ORAL_TABLET | Freq: Every day | ORAL | Status: DC
  Administered 2018-02-19: 100 mg via ORAL
  Filled 2018-02-18: qty 1

## 2018-02-18 MED ORDER — INSULIN ASPART 100 UNIT/ML ~~LOC~~ SOLN: 0.0000 [IU] | Freq: Three times a day (TID) | SUBCUTANEOUS | Status: DC

## 2018-02-18 MED ORDER — ENSURE ENLIVE PO LIQD
237.0000 mL | Freq: Two times a day (BID) | ORAL | Status: DC
  Administered 2018-02-21 – 2018-02-23 (×5): 237 mL via ORAL
  Filled 2018-02-18: qty 237

## 2018-02-18 MED ORDER — SODIUM CHLORIDE 0.9 % IV BOLUS (SEPSIS)
250.0000 mL | Freq: Once | INTRAVENOUS | Status: AC
  Administered 2018-02-18: 250 mL via INTRAVENOUS

## 2018-02-18 MED ORDER — VANCOMYCIN HCL IN DEXTROSE 1-5 GM/200ML-% IV SOLN
1000.0000 mg | INTRAVENOUS | Status: DC
  Filled 2018-02-18: qty 200

## 2018-02-18 MED ORDER — ONDANSETRON HCL 4 MG/2ML IJ SOLN
4.0000 mg | Freq: Four times a day (QID) | INTRAMUSCULAR | Status: DC | PRN
  Administered 2018-02-19: 4 mg via INTRAVENOUS
  Filled 2018-02-18: qty 2

## 2018-02-18 MED ORDER — METRONIDAZOLE IN NACL 5-0.79 MG/ML-% IV SOLN
500.0000 mg | Freq: Three times a day (TID) | INTRAVENOUS | Status: DC
  Administered 2018-02-18 – 2018-02-19 (×3): 500 mg via INTRAVENOUS
  Filled 2018-02-18 (×4): qty 100

## 2018-02-18 MED ORDER — SODIUM CHLORIDE 0.9 % IV SOLN: 2.0000 g | Freq: Once | INTRAVENOUS | Status: DC

## 2018-02-18 MED ORDER — ONDANSETRON HCL 4 MG PO TABS
4.0000 mg | ORAL_TABLET | Freq: Four times a day (QID) | ORAL | Status: DC | PRN
  Administered 2018-02-21: 4 mg via ORAL
  Filled 2018-02-18: qty 1

## 2018-02-18 MED ORDER — LORAZEPAM 0.5 MG PO TABS
0.5000 mg | ORAL_TABLET | Freq: Three times a day (TID) | ORAL | Status: DC
  Administered 2018-02-19: 0.5 mg via ORAL
  Filled 2018-02-18: qty 1

## 2018-02-18 MED ORDER — VANCOMYCIN HCL IN DEXTROSE 1-5 GM/200ML-% IV SOLN
1000.0000 mg | Freq: Once | INTRAVENOUS | Status: AC
  Administered 2018-02-18: 1000 mg via INTRAVENOUS
  Filled 2018-02-18: qty 200

## 2018-02-18 MED ORDER — SODIUM CHLORIDE 0.9 % IV BOLUS (SEPSIS)
500.0000 mL | Freq: Once | INTRAVENOUS | Status: AC
  Administered 2018-02-18: 500 mL via INTRAVENOUS

## 2018-02-18 MED ORDER — SODIUM CHLORIDE 0.9 % IV SOLN
1.0000 g | INTRAVENOUS | Status: DC
  Filled 2018-02-18: qty 1

## 2018-02-18 NOTE — ED Notes (Signed)
Dr. Rogene Houston called about Lactic acid results

## 2018-02-18 NOTE — ED Provider Notes (Signed)
[facility] Provider Note   CSN: [id number] Arrival date & time: 02/18/18  1605     History   Chief Complaint Chief Complaint  Patient presents with  . Shortness of Breath  . Blood Infection    HPI Angela Hines is a 84 y.o. female.  Patient brought into [facility] home.  Hypotensive significant respiratory distress.  Patient is a DNR.  Patient with a history of CHF COPD.  Patient just recently started on oxygen.  Patient nonverbal I think due to the respiratory distress.     Past Medical History:  Diagnosis Date  . Anginal pain ([place]) ~ 2007   "mini stroke affected her right eye; made it droop; fully recovered" (01/31/2018)  . Anxiety   . Aortic stenosis   . Arthritis    "joints" (01/31/2018)  . Atrial fibrillation ([place])   . CHF (congestive heart failure) ([place])   . Chronic bronchitis ([place])   . COPD (chronic obstructive pulmonary disease) ([place])   . Depressed affect   . Depression   . GERD (gastroesophageal reflux disease)   . Headache    "monthly" (01/31/2018)  . High cholesterol   . Hypertension   . Migraine    "none since <1990" (01/31/2018)  . Paroxysmal atrial fibrillation (HCC)   . Pneumonia 2019  . Type II diabetes mellitus ([place])    "recently dc'd Metformin" (01/31/2018)    Patient Active Problem List   Diagnosis Date Noted  . Hypotension 02/18/2018  . Hepatitis 02/18/2018  . Streptococcal bacteremia   . Respiratory distress   . Goals of care, counseling/discussion   . Palliative care encounter   . Sepsis ([place]) 01/31/2018  . Diabetes mellitus without complication ([place]) 14/97/0263  . Depression with anxiety 12/17/2017  . UTI (urinary tract infection) 12/17/2017  . Hypokalemia 12/17/2017  . Intussusception ([place])   . Falls frequently 12/14/2017  . Paracetamol (acetaminophen) overdose, accidental or unintentional, initial encounter 11/21/2017  . Nausea & vomiting 09/10/2017  . Chest pain 09/10/2017   . High anion gap metabolic acidosis 78/58/8502  . Chronic systolic CHF (congestive heart failure) ([place]) 09/10/2017  . Protein-calorie malnutrition, severe 06/11/2017  . Prolonged QT interval   . COPD (chronic obstructive pulmonary disease) ([place]) 07/17/2016  . Diabetes mellitus ([place]) 07/17/2016  . Gastroesophageal reflux disease without esophagitis 07/17/2016  . Generalized anxiety disorder 07/17/2016  . Hypercholesterolemia 07/17/2016  . Hypertension 07/17/2016  . Status post placement of implantable loop recorder 07/17/2016  . AF (paroxysmal atrial fibrillation) ([place]) 07/17/2016  . Primary insomnia 07/17/2016  . History of prosthetic aortic valve 07/17/2016  . History of syncope 08/07/2014    Past Surgical History:  Procedure Laterality Date  . CARDIAC PACEMAKER PLACEMENT  07/30/2014  . 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 CARDIOVERSION N/A 06/15/2017   Procedure: CARDIOVERSION;  Surgeon: Lelon Perla, MD;  Location: [facility] ENDOSCOPY;  Service: Cardiovascular;  Laterality: N/A;  . CATARACT EXTRACTION, BILATERAL Bilateral   . EXCISIONAL HEMORRHOIDECTOMY    . LAPAROSCOPIC CHOLECYSTECTOMY    . LOOP RECORDER INSERTION N/A 08/22/2017   Procedure: LOOP RECORDER INSERTION;  Surgeon: [name], MD;  Location: [facility] CV LAB;  Service: Cardiovascular;  Laterality: N/A;  . LOOP RECORDER REMOVAL N/A 08/22/2017   Procedure: LOOP RECORDER REMOVAL;  Surgeon: [name], MD;  Location: [facility] CV LAB;  Service: Cardiovascular;  Laterality: N/A;     OB History   None  Home Medications    Prior to Admission medications   Medication Sig Start Date End Date Taking? Authorizing Provider  albuterol (PROVENTIL HFA;VENTOLIN HFA) 108 (90 Base) MCG/ACT inhaler Inhale 2 puffs into the lungs every 6 (six) hours as needed for shortness of breath.   Yes [provider]  amiodarone (PACERONE) 100 MG tablet Take 100 mg by mouth daily.   Yes  [provider]  buPROPion (WELLBUTRIN XL) 300 MG 24 hr tablet Take 300 mg by mouth daily.  06/20/17  Yes [provider]  cephALEXin (KEFLEX) 500 MG capsule Take 1 capsule (500 mg total) by mouth 2 (two) times daily. No stop date Patient taking differently: Take 500 mg by mouth 2 (two) times daily. No stop date (for streptococcal bacteremia) 02/10/18 12/07/18 Yes Thurnell Lose, MD  furosemide (LASIX) 20 MG tablet Take 1 tablet (20 mg total) by mouth daily. 10/23/17  Yes Daune Perch, NP  gabapentin (NEURONTIN) 600 MG tablet Take 600 mg by mouth at bedtime.  07/17/16  Yes [provider]  LORazepam (ATIVAN) 0.5 MG tablet Take 1 tablet (0.5 mg total) by mouth 3 (three) times daily as needed for anxiety. Patient taking differently: Take 0.5 mg by mouth every 8 (eight) hours as needed for anxiety.  11/14/17  Yes Upstill, Nehemiah Settle, PA-C  magnesium oxide (MAG-OX) 400 (241.3 Mg) MG tablet Take 1 tablet (400 mg total) by mouth daily. 05/24/17  Yes Nita Sells, MD  metFORMIN (GLUCOPHAGE) 1000 MG tablet Take 1,000 mg by mouth daily. 12/01/16  Yes [provider]  metoprolol tartrate (LOPRESSOR) 25 MG tablet Take 1 tablet (25 mg total) by mouth 2 (two) times daily. 02/14/18  Yes Allred, Jeneen Rinks, MD  Nutritional Supplements (NUTRITIONAL SUPPLEMENT PO) Take 120 mLs by mouth 3 (three) times daily. MedPass 2.0   Yes [provider]  omeprazole (PRILOSEC) 40 MG capsule Take 40 mg by mouth daily at 6 (six) AM.    Yes [provider]  polyethylene glycol (MIRALAX / GLYCOLAX) packet Take 17 g by mouth daily. Patient taking differently: Take 17 g by mouth See admin instructions. Mix 17 g in 4-6 oz fluid and drink daily for constipation, may also take 17 g mixed in 4-6 oz fluid daily as needed for constipation. 12/20/17  Yes Donne Hazel, MD  predniSONE (DELTASONE) 5 MG tablet Label  & dispense according to the schedule below. 10 Pills PO for 3 days then, 8 Pills  PO for 3 days, 6 Pills PO for 3 days, 4 Pills PO for 3 days, 2 Pills PO for 3 days, 1 Pills PO for 3 days, 1/2 Pill  PO for 3 days then STOP. Total 95 pills. Patient taking differently: Take 2.5-50 mg by mouth See admin instructions. Tapered course ordered 02/05/18: take 10 tablets (50 mg) by mouth daily for 3 days, then take 8 tablets (40 mg) daily for 3 days, then take 6 tablets (30 mg) daily for 3 days, then take 4 tablets (20 mg) daily for 3 days, then take 2 tablets (10 mg) daily for 3 days, then take 1 tablet (5 mg) daily for 3 days, then take 1/2 tablet (2.5 mg) daily for 3 days, then stop 02/05/18  Yes Thurnell Lose, MD  Rivaroxaban (XARELTO) 15 MG TABS tablet Take 15 mg by mouth daily with supper. 07/17/16  Yes [provider]  feeding supplement, ENSURE ENLIVE, (ENSURE ENLIVE) LIQD Take 237 mLs by mouth 3 (three) times daily between meals. Patient not taking: Reported  on 02/18/2018 06/12/17   Lavina Hamman, MD  polyethylene glycol [facility] / Floria Raveling) packet Take 17 g by mouth daily as needed for mild constipation. Patient not taking: Reported on 02/18/2018 06/12/17   Lavina Hamman, MD    Family History Family History  Problem Relation Age of Onset  . Emphysema Father   . Hypertension Sister   . Lung disease Sister   . Hypertension Brother   . Hypertension Brother   . Stroke Brother   . Other Son        one spleen removed, one with chronic pain from MVA  . Diabetes Son   . Multiple myeloma Son   . Heart disease Son   . Heart attack Son     Social History Social History   Tobacco Use  . Smoking status: Never Smoker  . Smokeless tobacco: Never Used  Substance Use Topics  . Alcohol use: Not Currently    Frequency: Never    Comment: 01/31/2018 "did drink ~ 1 glass of wine/wk; nothing in the last 6 months or more"  . Drug use: Never     Allergies   Clonidine; Captopril; Codeine; Ampicillin; and Ceclor [cefaclor]   Review of Systems Review of Systems    Unable to perform ROS: Mental status change     Physical Exam Updated Vital Signs BP (!) 139/97   Pulse 99   Resp 20   Ht 1.702 m (5' 7" )   Wt 52.6 kg   SpO2 100%   BMI 18.17 kg/m   Physical Exam  Constitutional: She appears distressed.  Patient very thin  HENT:  Head: Normocephalic and atraumatic.  Mucous membranes very dry.  Eyes: Pupils are equal, round, and reactive to light. EOM are normal.  Cardiovascular: Normal heart sounds.  Tachycardic  Pulmonary/Chest: She is in respiratory distress.  Breath sounds equal bilaterally no wheezing but very tachypneic.  Very shallow breathing.  Abdominal: Soft. Bowel sounds are normal. There is no tenderness.  Musculoskeletal: Normal range of motion.  Neurological: No cranial nerve deficit or sensory deficit. She exhibits normal muscle tone. Coordination normal.  Patient nonverbal.  Respiratory distress.  Not able to follow commands.  Skin: Skin is warm. Capillary refill takes less than 2 seconds. No rash noted.  Nursing note and vitals reviewed.    ED Treatments / Results  Labs (all labs ordered are listed, but only abnormal results are displayed) Labs Reviewed  COMPREHENSIVE METABOLIC PANEL - Abnormal; Notable for the following components:      Result Value   Glucose, Bld 60 (*)    Calcium 8.5 (*)    Total Protein 5.8 (*)    Albumin 3.0 (*)    AST 949 (*)    ALT 1,132 (*)    Alkaline Phosphatase 134 (*)    Total Bilirubin 2.1 (*)    GFR calc non Af Amer 60 (*)    All other components within normal limits  CBC WITH DIFFERENTIAL/PLATELET - Abnormal; Notable for the following components:   WBC 19.8 (*)    RBC 3.57 (*)    Hemoglobin 8.3 (*)    HCT 31.8 (*)    MCH 23.2 (*)    MCHC 26.1 (*)    RDW 20.8 (*)    Neutro Abs 16.7 (*)    Monocytes Absolute 1.3 (*)    Abs Immature Granulocytes 0.38 (*)    All other components within normal limits  PROTIME-INR - Abnormal; Notable for the following components:   Prothrombin  Time 24.3 (*)    All other components within normal limits  I-STAT CG4 LACTIC ACID, ED - Abnormal; Notable for the following components:   Lactic Acid, Venous 5.85 (*)    All other components within normal limits  I-STAT CG4 LACTIC ACID, ED - Abnormal; Notable for the following components:   Lactic Acid, Venous 4.18 (*)    All other components within normal limits  I-STAT ARTERIAL BLOOD GAS, ED - Abnormal; Notable for the following components:   pO2, Arterial 175.0 (*)    Acid-base deficit 4.0 (*)    All other components within normal limits  CULTURE, BLOOD (ROUTINE X 2)  CULTURE, BLOOD (ROUTINE X 2)  URINALYSIS, ROUTINE W REFLEX MICROSCOPIC  COMPREHENSIVE METABOLIC PANEL  CBC  I-STAT CG4 LACTIC ACID, ED    EKG EKG Interpretation  Date/Time:  Monday February 18 2018 16:31:30 EDT Ventricular Rate:  91 PR Interval:    QRS Duration: 116 QT Interval:  444 QTC Calculation: 547 R Axis:   -76 Text Interpretation:  Atrial fibrillation LAD, consider left anterior fascicular block Left ventricular hypertrophy No significant change since last tracing Confirmed by Fredia Sorrow [phone]) on 02/18/2018 5:15:54 PM   Radiology Dg Chest Port 1 View  Result Date: 02/18/2018 CLINICAL DATA:  Sepsis. EXAM: PORTABLE CHEST 1 VIEW COMPARISON:  02/01/2018 and prior radiograph FINDINGS: Cardiomegaly, cardiac valve replacement and loop recorder again noted. Mild peribronchial thickening is unchanged There is no evidence of focal airspace disease, pulmonary edema, suspicious pulmonary nodule/mass, pleural effusion, or pneumothorax. No acute bony abnormalities are identified. IMPRESSION: Cardiomegaly without evidence of acute cardiopulmonary disease Electronically Signed   By: Margarette Canada M.D.   On: 02/18/2018 17:29    Procedures Procedures (including critical care time)  CRITICAL CARE Performed by: Fredia Sorrow Total critical care time: 45 minutes Critical care time was exclusive of separately  billable procedures and treating other patients. Critical care was necessary to treat or prevent imminent or life-threatening deterioration. Critical care was time spent personally by me on the following activities: development of treatment plan with patient and/or surrogate as well as nursing, discussions with consultants, evaluation of patient's response to treatment, examination of patient, obtaining history from patient or surrogate, ordering and performing treatments and interventions, ordering and review of laboratory studies, ordering and review of radiographic studies, pulse oximetry and re-evaluation of patient's condition.   Medications Ordered in ED Medications  metroNIDAZOLE (FLAGYL) IVPB 500 mg (0 mg Intravenous Stopped 02/18/18 1813)  vancomycin (VANCOCIN) IVPB 1000 mg/200 mL premix (has no administration in time range)  ceFEPIme (MAXIPIME) 1 g in sodium chloride 0.9 % 100 mL IVPB (has no administration in time range)  amiodarone (PACERONE) tablet 100 mg (0 mg Oral Hold 02/18/18 2045)  buPROPion (WELLBUTRIN XL) 24 hr tablet 300 mg (0 mg Oral Hold 02/18/18 2045)  LORazepam (ATIVAN) tablet 0.5 mg (0.5 mg Oral Not Given 02/18/18 2111)  Rivaroxaban (XARELTO) tablet 15 mg (0 mg Oral Hold 02/18/18 2139)  gabapentin (NEURONTIN) capsule 600 mg (0 mg Oral Hold 02/18/18 2111)  feeding supplement (ENSURE ENLIVE) (ENSURE ENLIVE) liquid 237 mL (has no administration in time range)  0.9 %  sodium chloride infusion ( Intravenous New Bag/Given 02/18/18 2119)  ondansetron (ZOFRAN) tablet 4 mg (has no administration in time range)    Or  ondansetron (ZOFRAN) injection 4 mg (has no administration in time range)  insulin aspart (novoLOG) injection 0-9 Units (has no administration in time range)  sodium chloride 0.9 % bolus 1,000 mL (0  mLs Intravenous Stopped 02/18/18 1758)    And  sodium chloride 0.9 % bolus 500 mL (0 mLs Intravenous Stopped 02/18/18 1853)    And  sodium chloride 0.9 % bolus 250  mL (0 mLs Intravenous Stopped 02/18/18 1801)  vancomycin (VANCOCIN) IVPB 1000 mg/200 mL premix (0 mg Intravenous Stopped 02/18/18 1812)  ceFEPIme (MAXIPIME) 2 g in sodium chloride 0.9 % 100 mL IVPB (0 g Intravenous Stopped 02/18/18 1801)  LORazepam (ATIVAN) injection 0.5 mg (0.5 mg Intravenous Given 02/18/18 2117)     Initial Impression / Assessment and Plan / ED Course  I have reviewed the triage vital signs and the nursing notes.  Pertinent labs & imaging results that were available during my care of the patient were reviewed by me and considered in my medical decision making (see chart for details).     Patient brought in from [facility] facility.  Patient is under palliative care.  Does not have hospice care and does not have a terminal diagnosis at this time.  Patient's son arrived with her.  Days patient was noted to be very much out of it today difficulty breathing.  And blood pressures were low.  Patient with recent admission went to nursing facility for rehab.  Son said she is been getting physical therapy and there is been improvements.  They were hoping to get her back to assisted living.  But they also got palliative services involved when she was in the hospital before.  Patient arrived hypotensive with difficulty breathing.  Patient met septic criteria.  Patient received fluid challenge.  Broad-spectrum antibiotics.  With her fluid challenge of blood pressure elevated back up to above 100.  On BiPAP patient's breathing improved.  And blood gas was done if she been on BiPAP for about an hour which showed reasonable pH and PCO2.  Patient sons a little torn based on the mother's wishes.  She is a DNR we had a long discussion about her quality of life which actually seemed like it was not too bad.  So they opted to go forward with additional treatment.  They are going to have palliative services reconsult with them to have better understanding what to expect in the  future.  Chest x-ray negative for any acute findings.    Final Clinical Impressions(s) / ED Diagnoses   Final diagnoses:  Sepsis, due to unspecified organism, unspecified whether acute organ dysfunction present [facility])    ED Discharge Orders    None       Fredia Sorrow, MD 02/18/18 2319

## 2018-02-18 NOTE — ED Triage Notes (Signed)
Per ems pt was in [facility] for rehab after sepsis 2nd to endocarditis. Pt was SOB pale and hypotensive at snf. EN ROUTE bp 80/50 to 90/60. CBG 84.

## 2018-02-18 NOTE — ED Notes (Signed)
ED Provider at bedside. 

## 2018-02-18 NOTE — ED Notes (Signed)
Admitting doctor at bedside 

## 2018-02-18 NOTE — ED Notes (Signed)
Family agrees with proceeding with BiPAP, respiratory called back to come apply BiPAP.

## 2018-02-18 NOTE — Progress Notes (Signed)
Pharmacy Antibiotic Note  Angela Hines is a 84 y.o. female admitted on 02/18/2018 with SOB and hypotension. Pharmacy has been consulted for vancomycin and cefepime dosing as empiric therapy.  Patient is also on Flagyl.  Noted history of recent endocarditis from which patient was treated with Rocephin followed by indefinite Keflex.  SCr 0.87, CrCL 42 ml/min   Plan: Vanc 1gm IV x 1, then Q24H Cefepime 2gm IV x 1, then 1gm IV Q24H Flagyl 500mg  IV Q8H per MD Monitor renal fxn, clinical progress, vanc trough as indicated   Height: 5\' 7"  (170.2 cm) Weight: 116 lb (52.6 kg) IBW/kg (Calculated) : 61.6  No data recorded.  No results for input(s): WBC, CREATININE, LATICACIDVEN, VANCOTROUGH, VANCOPEAK, VANCORANDOM, GENTTROUGH, GENTPEAK, GENTRANDOM, TOBRATROUGH, TOBRAPEAK, TOBRARND, AMIKACINPEAK, AMIKACINTROU, AMIKACIN in the last 168 hours.  Estimated Creatinine Clearance: 44.2 mL/min (by C-G formula based on SCr of 0.69 mg/dL).    Allergies  Allergen Reactions  . Clonidine Swelling and Other (See Comments)    "head swelling"  . Captopril Swelling  . Codeine Nausea And Vomiting and Other (See Comments)    GI Intolerance   . Ampicillin Rash  . Ceclor [Cefaclor] Rash    Vanc 10/28 >> Cefepime 10/28 >> Flagyl 10/28 >>  10/28 BCx -    Sanav Remer D. Mina Marble, PharmD, BCPS, [place] 02/18/2018, 6:08 PM

## 2018-02-18 NOTE — ED Notes (Signed)
Dr Rogene Houston at bedside to discuss code status and treatment options with pt, delay in blood draw due to whether or not we would proceed with full treatment or not.

## 2018-02-18 NOTE — ED Notes (Signed)
Pt now following commands and opens eyes to voice.

## 2018-02-18 NOTE — H&P (Signed)
History and Physical   Angela Hines UMP:[id number] DOB: 1932-06-16 DOA: 02/18/2018  Referring MD/NP/PA: Dr. Rogene Houston  PCP: Angela Pillar, MD   Patient coming from: Home  Chief Complaint: Shortness of breath  HPI: Angela Hines is a 84 y.o. female with medical history significant of recent hospitalization with streptococcal bacteremia, UTI, arrhythmias, CHF, aortic stenosis, GERD, atrial fibrillation, COPD who was just discharged from the hospital and is now a DNR.  She was brought in from her facility by her Angela Hines with acute cardiorespiratory failure sepsis type syndrome with no obvious source of sepsis.  Patient was profoundly hypotensive altered mental status was elected a more than 5.  She was immediately given IV fluids to which she responded.  She was hypoglycemic and overall in respiratory failure requiring BiPAP.  During last hospitalization hospice was discussed.  Angela Hines is with the patient and not sure if he wanted patient treated or just hospice care.  He asked for patient's treatment at this point however they want to transition to hospice home from tomorrow.  Patient is therefore being admitted on BiPAP.  ED Course: Patient is temperature was 99 with initial blood pressure systolic in the 40G but currently up to 130s with fluid bolus heart rate is 105 with sats of 94% on the BiPAP.  She has a white count of 19.8 hemoglobin 8.3 with platelets 229.  Glucose is 60 calcium 8.5 and lactic acid of 5.85.  Blood cultures are obtained patient initiated on antibiotics.  She is being admitted to the hospital for treatment.  She is currently on BiPAP and increasing mentation.  Review of Systems: As per HPI otherwise 10 point review of systems negative.    Past Medical History:  Diagnosis Date  . Anginal pain ([place]) ~ 2007   "mini stroke affected her right eye; made it droop; fully recovered" (01/31/2018)  . Anxiety   . Aortic stenosis   . Arthritis    "joints" (01/31/2018)  . Atrial  fibrillation ([place])   . CHF (congestive heart failure) ([place])   . Chronic bronchitis ([place])   . COPD (chronic obstructive pulmonary disease) ([place])   . Depressed affect   . Depression   . GERD (gastroesophageal reflux disease)   . Headache    "monthly" (01/31/2018)  . High cholesterol   . Angela Hines   . Migraine    "none since <1990" (01/31/2018)  . Paroxysmal atrial fibrillation ([facility])   . Pneumonia 2019  . Type II diabetes mellitus ([place])    "recently dc'd Metformin" (01/31/2018)    Past Surgical History:  Procedure Laterality Date  . CARDIAC PACEMAKER PLACEMENT  07/30/2014  . 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 CARDIOVERSION N/A 06/15/2017   Procedure: CARDIOVERSION;  Surgeon: Angela Perla, MD;  Location: [facility] ENDOSCOPY;  Service: Cardiovascular;  Laterality: N/A;  . CATARACT EXTRACTION, BILATERAL Bilateral   . EXCISIONAL HEMORRHOIDECTOMY    . LAPAROSCOPIC CHOLECYSTECTOMY    . LOOP RECORDER INSERTION N/A 08/22/2017   Procedure: LOOP RECORDER INSERTION;  Surgeon: Angela Grayer, MD;  Location: [facility] LAB;  Service: Cardiovascular;  Laterality: N/A;  . LOOP RECORDER REMOVAL N/A 08/22/2017   Procedure: LOOP RECORDER REMOVAL;  Surgeon: Angela Grayer, MD;  Location: [place] [facility] CV LAB;  Service: Cardiovascular;  Laterality: N/A;     reports that she has never smoked. She has never used smokeless tobacco. She reports that she drank alcohol. She reports that she does not use drugs.  Allergies  Allergen Reactions  . Clonidine Swelling and Other (See Comments)    "head swelling"  . Captopril Swelling  . Codeine Nausea And Vomiting and Other (See Comments)    GI Intolerance   . Ampicillin Rash  . Ceclor [Cefaclor] Rash    Family History  Problem Relation Age of Onset  . Angela Hines Father   . Angela Hines Sister   . Angela Hines disease Sister   . Angela Hines Brother   . Angela Hines Brother   . Stroke Brother   . Other Angela Hines        one spleen  removed, one with chronic pain from MVA  . Diabetes Angela Hines   . Multiple myeloma Angela Hines   . Heart disease Angela Hines   . Heart attack Angela Hines      Prior to Admission medications   Medication Sig Start Date End Date Taking? Authorizing Provider  albuterol (PROVENTIL HFA;VENTOLIN HFA) 108 (90 Base) MCG/ACT inhaler Inhale 2 puffs into the lungs every 6 (six) hours as needed for shortness of breath.    [provider]  amiodarone (PACERONE) 100 MG tablet Take 100 mg by mouth daily.    [provider]  buPROPion (WELLBUTRIN XL) 300 MG 24 hr tablet Take 300 mg by mouth daily.  06/20/17   [provider]  cephALEXin (KEFLEX) 500 MG capsule Take 1 capsule (500 mg total) by mouth 2 (two) times daily. No stop date 02/10/18 12/07/18  Thurnell Lose, MD  feeding supplement, ENSURE ENLIVE, (ENSURE ENLIVE) LIQD Take 237 mLs by mouth 3 (three) times daily between meals. Patient taking differently: Take 237 mLs by mouth 2 (two) times daily between meals.  06/12/17   Lavina Hamman, MD  furosemide (LASIX) 20 MG tablet Take 1 tablet (20 mg total) by mouth daily. 10/23/17   Daune Perch, NP  gabapentin (NEURONTIN) 300 MG capsule Take 600 mg by mouth at bedtime.  07/17/16   [provider]  LORazepam (ATIVAN) 0.5 MG tablet Take 1 tablet (0.5 mg total) by mouth 3 (three) times daily as needed for anxiety. Patient taking differently: Take 0.5 mg by mouth 3 (three) times daily. 1 tablet in the morning, 1 tablet in the evening and 2 tablets every night at bedtime 11/14/17   Charlann Lange, PA-C  magnesium oxide (MAG-OX) 400 (241.3 Mg) MG tablet Take 1 tablet (400 mg total) by mouth daily. 05/24/17   Nita Sells, MD  metFORMIN (GLUCOPHAGE) 1000 MG tablet Take 1,000 mg by mouth daily. 12/01/16   [provider]  metoprolol tartrate (LOPRESSOR) 25 MG tablet Take 1 tablet (25 mg total) by mouth 2 (two) times daily. 02/14/18   Allred, Jeneen Rinks, MD  omeprazole (PRILOSEC) 40 MG capsule Take 40  mg by mouth daily.     [provider]  polyethylene glycol (MIRALAX / GLYCOLAX) packet Take 17 g by mouth daily as needed for mild constipation. 06/12/17   Lavina Hamman, MD  polyethylene glycol [facility] / Floria Raveling) packet Take 17 g by mouth daily. Patient not taking: Reported on 01/31/2018 12/20/17   Donne Hazel, MD  predniSONE (DELTASONE) 5 MG tablet Label  & dispense according to the schedule below. 10 Pills PO for 3 days then, 8 Pills PO for 3 days, 6 Pills PO for 3 days, 4 Pills PO for 3 days, 2 Pills PO for 3 days, 1 Pills PO for 3 days, 1/2 Pill  PO for 3 days then STOP. Total 95 pills. 02/05/18   Thurnell Lose, MD  Rivaroxaban Alveda Reasons)  15 MG TABS tablet Take 15 mg by mouth daily with supper. 07/17/16   [provider]    Physical Exam: Vitals:   02/18/18 1716 02/18/18 1730 02/18/18 1800 02/18/18 1815  BP: 121/69 102/74 117/79   Pulse: 87 90 91   Resp: (!) 27 (!) 26 (!) 25 (!) 29  SpO2: 100% 100% 100%   Weight:      Height:          Constitutional: Frail woman in respiratory distress on BiPAP Vitals:   02/18/18 1716 02/18/18 1730 02/18/18 1800 02/18/18 1815  BP: 121/69 102/74 117/79   Pulse: 87 90 91   Resp: (!) 27 (!) 26 (!) 25 (!) 29  SpO2: 100% 100% 100%   Weight:      Height:       Eyes: PERRL, lids and conjunctivae normal ENMT: Mucous membranes are moist. Posterior pharynx clear of any exudate or lesions.Normal dentition.  Neck: normal, supple, no masses, no thyromegaly Respiratory: Respiratory distress with use of extra muscle respiration and poor air entry with some rales Cardiovascular: Regular rate and rhythm, no murmurs / rubs / gallops. No extremity edema. 2+ pedal pulses. No carotid bruits.  Abdomen: no tenderness, no masses palpated. No hepatosplenomegaly. Bowel sounds positive.  Musculoskeletal: no clubbing / cyanosis. No joint deformity upper and lower extremities. Good ROM, no contractures. Normal muscle tone.  Skin: no rashes,  lesions, ulcers. No induration Neurologic: CN 2-12 grossly intact. Sensation intact, DTR normal. Strength 5/5 in all 4.  Psychiatric: Normal judgment and insight. Alert and oriented x 3. Normal mood.     Labs on Admission: I have personally reviewed following labs and imaging studies  CBC: Recent Labs  Lab 02/18/18 1631  WBC PENDING  NEUTROABS PENDING  HGB 8.3*  HCT 31.8*  MCV 89.1  PLT 544   Basic Metabolic Panel: Recent Labs  Lab 02/18/18 1631  NA 140  K 4.8  CL 104  CO2 24  GLUCOSE 60*  BUN 23  CREATININE 0.87  CALCIUM 8.5*   GFR: Estimated Creatinine Clearance: 40.7 mL/min (by C-G formula based on SCr of 0.87 mg/dL). Liver Function Tests: Recent Labs  Lab 02/18/18 1631  AST 949*  ALT 1,132*  ALKPHOS 134*  BILITOT 2.1*  PROT 5.8*  ALBUMIN 3.0*   No results for input(s): LIPASE, AMYLASE in the last 168 hours. No results for input(s): AMMONIA in the last 168 hours. Coagulation Profile: Recent Labs  Lab 02/18/18 1631  INR 2.22   Cardiac Enzymes: No results for input(s): CKTOTAL, CKMB, CKMBINDEX, TROPONINI in the last 168 hours. BNP (last 3 results) No results for input(s): PROBNP in the last 8760 hours. HbA1C: No results for input(s): HGBA1C in the last 72 hours. CBG: No results for input(s): GLUCAP in the last 168 hours. Lipid Profile: No results for input(s): CHOL, HDL, LDLCALC, TRIG, CHOLHDL, LDLDIRECT in the last 72 hours. Thyroid Function Tests: No results for input(s): TSH, T4TOTAL, FREET4, T3FREE, THYROIDAB in the last 72 hours. Anemia Panel: No results for input(s): VITAMINB12, FOLATE, FERRITIN, TIBC, IRON, RETICCTPCT in the last 72 hours. Urine analysis:    Component Value Date/Time   COLORURINE YELLOW 01/31/2018 0645   APPEARANCEUR CLEAR 01/31/2018 0645   LABSPEC 1.018 01/31/2018 0645   PHURINE 6.0 01/31/2018 0645   GLUCOSEU 150 (A) 01/31/2018 0645   HGBUR NEGATIVE 01/31/2018 0645   BILIRUBINUR NEGATIVE 01/31/2018 0645   KETONESUR  NEGATIVE 01/31/2018 0645   PROTEINUR 30 (A) 01/31/2018 0645   NITRITE NEGATIVE  01/31/2018 0645   LEUKOCYTESUR NEGATIVE 01/31/2018 0645   Sepsis Labs: _0 (procalcitonin:4,lacticidven:4) )No results found for this or any previous visit (from the past 240 hour(s)).   Radiological Exams on Admission: Dg Chest Port 1 View  Result Date: 02/18/2018 CLINICAL DATA:  Sepsis. EXAM: PORTABLE CHEST 1 VIEW COMPARISON:  02/01/2018 and prior radiograph FINDINGS: Cardiomegaly, cardiac valve replacement and loop recorder again noted. Mild peribronchial thickening is unchanged There is no evidence of focal airspace disease, pulmonary edema, suspicious pulmonary nodule/mass, pleural effusion, or pneumothorax. No acute bony abnormalities are identified. IMPRESSION: Cardiomegaly without evidence of acute cardiopulmonary disease Electronically Signed   By: Margarette Canada M.D.   On: 02/18/2018 17:29    Assessment/Plan Principal Problem:   Sepsis ([place]) Active Problems:   COPD (chronic obstructive pulmonary disease) ([place])   Diabetes mellitus ([place])   Gastroesophageal reflux disease without esophagitis   Angela Hines   Hypotension   Hepatitis     #1 sepsis: Patient is exhibiting septic syndrome but no obvious source.  Urine and chest x-ray appears not infected but she recently had bacteremia.  We will empirically start her on antibiotics.  Obtain new blood cultures and monitor.  She has already responded to fluids.  #2 acute on chronic respiratory failure: Most likely related to her COPD and sepsis.  Already responding on the BiPAP.  Will admit to stepdown.  Continue antibiotics with nebulizer.  Steroids will be added if needed at this point she is not wheezing.  #3 diabetes: Sliding scale insulin with home regimen  #4 Angela Hines: Patient came in with hypotension.  Resume home regimen only if blood pressure increases.  #5 GERD: Continue with PPIs.  #6 ethics: Angela Hines and daughter-in-law have requested  hospice.  They would like to transition patient to hospice home.  They voiced frustration that patient has been going in cycles.  Symptoms have been going away and coming back quickly.  They have already seen palliative care last week.  We will obtain palliative care and hospice consult.   DVT prophylaxis: Xarelto  Code Status: DNR  Family Communication: Angela Hines and daughter-in-law both at bedside Disposition Plan: Probably hospice home Consults called: Hospice and palliative care consult Admission status: Inpatient  Severity of Illness: The appropriate patient status for this patient is INPATIENT. Inpatient status is judged to be reasonable and necessary in order to provide the required intensity of service to ensure the patient's safety. The patient's presenting symptoms, physical exam findings, and initial radiographic and laboratory data in the context of their chronic comorbidities is felt to place them at high risk for further clinical deterioration. Furthermore, it is not anticipated that the patient will be medically stable for discharge from the hospital within 2 midnights of admission. The following factors support the patient status of inpatient.   " The patient's presenting symptoms include shortness of breath and anxiety. " The worrisome physical exam findings include patient currently on BiPAP with respiratory distress. " The initial radiographic and laboratory data are worrisome because of no obvious pneumonia. " The chronic co-morbidities include advanced COPD with atrial fibrillation and CHF.   * I certify that at the point of admission it is my clinical judgment that the patient will require inpatient hospital care spanning beyond 2 midnights from the point of admission due to high intensity of service, high risk for further deterioration and high frequency of surveillance required.Barbette Merino MD [facility] Pager [phone]  If 7PM-7AM, please contact  night-coverage [email or web address] Password [email or web address]  02/18/2018, 6:19 PM

## 2018-02-19 ENCOUNTER — Other Ambulatory Visit: Payer: Self-pay

## 2018-02-19 LAB — CBG MONITORING, ED
GLUCOSE-CAPILLARY: 248 mg/dL — AB (ref 70–99)
GLUCOSE-CAPILLARY: 25 mg/dL — AB (ref 70–99)
Glucose-Capillary: 115 mg/dL — ABNORMAL HIGH (ref 70–99)

## 2018-02-19 LAB — CBC
HCT: 26.5 % — ABNORMAL LOW (ref 36.0–46.0)
HEMOGLOBIN: 7.5 g/dL — AB (ref 12.0–15.0)
MCH: 24.4 pg — AB (ref 26.0–34.0)
MCHC: 28.3 g/dL — ABNORMAL LOW (ref 30.0–36.0)
MCV: 86 fL (ref 80.0–100.0)
Platelets: 94 10*3/uL — ABNORMAL LOW (ref 150–400)
RBC: 3.08 MIL/uL — AB (ref 3.87–5.11)
RDW: 21.2 % — ABNORMAL HIGH (ref 11.5–15.5)
WBC: 17 10*3/uL — ABNORMAL HIGH (ref 4.0–10.5)
nRBC: 0.2 % (ref 0.0–0.2)

## 2018-02-19 LAB — COMPREHENSIVE METABOLIC PANEL
ALBUMIN: 2.7 g/dL — AB (ref 3.5–5.0)
ALT: 4550 U/L — ABNORMAL HIGH (ref 0–44)
AST: 5614 U/L — ABNORMAL HIGH (ref 15–41)
Alkaline Phosphatase: 132 U/L — ABNORMAL HIGH (ref 38–126)
Anion gap: 13 (ref 5–15)
BILIRUBIN TOTAL: 2.1 mg/dL — AB (ref 0.3–1.2)
BUN: 32 mg/dL — ABNORMAL HIGH (ref 8–23)
CO2: 21 mmol/L — ABNORMAL LOW (ref 22–32)
Calcium: 7.9 mg/dL — ABNORMAL LOW (ref 8.9–10.3)
Chloride: 106 mmol/L (ref 98–111)
Creatinine, Ser: 1.02 mg/dL — ABNORMAL HIGH (ref 0.44–1.00)
GFR calc Af Amer: 57 mL/min — ABNORMAL LOW (ref >60)
GFR calc non Af Amer: 49 mL/min — ABNORMAL LOW (ref >60)
GLUCOSE: 107 mg/dL — AB (ref 70–99)
POTASSIUM: 3.9 mmol/L (ref 3.5–5.1)
SODIUM: 140 mmol/L (ref 135–145)
TOTAL PROTEIN: 5.3 g/dL — AB (ref 6.5–8.1)

## 2018-02-19 LAB — GLUCOSE, CAPILLARY
GLUCOSE-CAPILLARY: 82 mg/dL (ref 70–99)
GLUCOSE-CAPILLARY: 47 mg/dL — AB (ref 70–99)
GLUCOSE-CAPILLARY: 172 mg/dL — AB (ref 70–99)
Glucose-Capillary: 69 mg/dL — ABNORMAL LOW (ref 70–99)
Glucose-Capillary: 143 mg/dL — ABNORMAL HIGH (ref 70–99)
Glucose-Capillary: 139 mg/dL — ABNORMAL HIGH (ref 70–99)

## 2018-02-19 MED ORDER — LORAZEPAM 2 MG/ML IJ SOLN: 0.5000 mg | INTRAMUSCULAR | Status: DC | PRN

## 2018-02-19 MED ORDER — METOPROLOL TARTRATE 5 MG/5ML IV SOLN
2.5000 mg | Freq: Four times a day (QID) | INTRAVENOUS | Status: DC
  Administered 2018-02-19 – 2018-02-20 (×4): 2.5 mg via INTRAVENOUS
  Filled 2018-02-19 (×4): qty 5

## 2018-02-19 MED ORDER — DEXTROSE 50 % IV SOLN
25.0000 mL | Freq: Once | INTRAVENOUS | Status: AC
  Administered 2018-02-19: 25 mL via INTRAVENOUS
  Filled 2018-02-19: qty 50

## 2018-02-19 MED ORDER — MORPHINE SULFATE (PF) 2 MG/ML IV SOLN
2.0000 mg | INTRAVENOUS | Status: DC | PRN
  Administered 2018-02-19 – 2018-02-24 (×12): 2 mg via INTRAVENOUS
  Filled 2018-02-19 (×12): qty 1

## 2018-02-19 MED ORDER — DEXTROSE-NACL 5-0.45 % IV SOLN
INTRAVENOUS | Status: DC
  Administered 2018-02-19: 17:00:00 via INTRAVENOUS

## 2018-02-19 MED ORDER — DEXTROSE 50 % IV SOLN
INTRAVENOUS | Status: AC
  Administered 2018-02-19: 1
  Filled 2018-02-19: qty 50

## 2018-02-19 MED ORDER — GABAPENTIN 300 MG PO CAPS
300.0000 mg | ORAL_CAPSULE | Freq: Every day | ORAL | Status: DC
  Administered 2018-02-19 – 2018-02-20 (×2): 300 mg via ORAL
  Filled 2018-02-19 (×2): qty 1

## 2018-02-19 MED ORDER — LORAZEPAM 2 MG/ML IJ SOLN
0.5000 mg | INTRAMUSCULAR | Status: DC | PRN
  Administered 2018-02-20 – 2018-02-23 (×4): 0.5 mg via INTRAVENOUS
  Administered 2018-02-23 – 2018-02-24 (×3): 1 mg via INTRAVENOUS
  Filled 2018-02-19 (×7): qty 1

## 2018-02-19 NOTE — Progress Notes (Signed)
RT NOTE:  Pt taken of BIPAP and placed on 4L [place]. Vitals stable. Pt tolerating well and sleeping at this time.

## 2018-02-19 NOTE — Progress Notes (Signed)
Palliative Medicine consult noted. Due to high referral volume, there may be a delay seeing this patient. Please call the Palliative Medicine Team office at [phone] if recommendations are needed in the interim.  If the patient's family is confident they want hospice, then [facility] can set up home hospice and SW can set up hospice facility without [place] needing to see her.  If immediate recommendations are needed in the interim, please call our office at [phone]. I have notified Dr Thereasa Solo of delay.  Marjie Skiff Lonell Stamos, RN, BSN, [facility] Palliative Medicine Team 02/19/2018 9:08 AM Office [phone]

## 2018-02-19 NOTE — Clinical Social Work Note (Signed)
Clinical Social Work Assessment  Patient Details  Name: Angela Hines MRN: [id number] Date of Birth: 1932/12/16  Date of referral:  02/19/18               Reason for consult:  Facility Placement, Discharge Planning, End of Life/Hospice                Permission sought to share information with:  Facility Sport and exercise psychologist, Family Supports Permission granted to share information::  Yes, Verbal Permission Granted  Name::     Legrand Como  Agency::     Relationship::  son  Contact Information:  [phone]  Housing/Transportation Living arrangements for the past 2 months:  [place] of Information:  Adult Children Patient Interpreter Needed:  None Criminal Activity/Legal Involvement Pertinent to Current Situation/Hospitalization:  No - Comment as needed Significant Relationships:  Adult Children Lives with:  Self Do you feel safe going back to the place where you live?  Yes Need for family participation in patient care:  Yes (Comment)  Care giving concerns: Patient from [facility] for rehab. Family interested in hospice/comfort care.    Social Worker assessment / plan: CSW met with patient and family at bedside x2 (first with patient and son, Shanon Brow, and then later with son [name] and his wife). Patient lethargic, but oriented, stated she was feeling "better." Patient dozed off during assessment. Patient's family indicated they met with palliative at patient's last hospitalization and palliative has been following patient at [facility]. Family stated that patient was agreeable to a comfort approach at last hospitalization. [name] stated that when he asked patient about comfort care and hospice this morning, she stated, "I'll think about it."   Family is hopeful to speak to palliative care during this hospitalization, and they are also hopeful to meet with the MD this afternoon. Family has questions about patient's prognosis.  CSW did review possible options for disposition  planning, pending patient's progress and prognosis, including home with hospice, SNF with hospice, and residential hospice. Family appreciative of information.  CSW awaiting continued medical workup and palliative recommendations. Will follow to support with disposition planning.  Employment status:  Retired Forensic scientist:  Medicare PT Recommendations:  Not assessed at this time Information / Referral to community resources:     Patient/Family's Response to care: Patient's family appreciative of care.  Patient/Family's Understanding of and Emotional Response to Diagnosis, Current Treatment, and Prognosis: Family with questions about patient's condition and prognosis.   Emotional Assessment Appearance:  Appears stated age Attitude/Demeanor/Rapport:  Lethargic Affect (typically observed):  Calm, Accepting Orientation:  Oriented to Self, Oriented to Place, Oriented to  Time, Oriented to Situation Alcohol / Substance use:  Not Applicable Psych involvement (Current and /or in the community):  No (Comment)  Discharge Needs  Concerns to be addressed:  Care Coordination, Discharge Planning Concerns Readmission within the last 30 days:  No Current discharge risk:  Physical Impairment, Terminally ill Barriers to Discharge:  Continued Medical Work up   Estanislado Emms, LCSW 02/19/2018, 2:49 PM

## 2018-02-19 NOTE — Progress Notes (Signed)
[place] TEAM 1 - Stepdown/ICU TEAM  Angela Hines  NLG:[id number] DOB: [birth year] DOA: 02/18/2018 PCP: Kelton Pillar, MD    Brief Narrative:  84 y.o. F w/ a hx of a recent hospitalization with streptococcal bacteremia (D/C 10/15), UTI, arrhythmias, CHF, aortic stenosis, GERD, atrial fibrillation, and COPD who was brought in from her facility with acute cardiorespiratory failure. Patient was profoundly hypotensive with altered mental status. She was given IV fluids to which she responded. She was found to be hypoglycemic.  Significant Events: 10/28 admit to [facility] from SNF  Subjective: The patient appears relatively comfortable in bed at the time of my visit.  She denies significant shortness of breath at this time but does report that it occurs intermittently.  She denies abdominal pain chest pain or vomiting but does report intermittent nausea.  I had a lengthy discussion with the patient in the presence of her 2 sons at bedside.  I discussed the serious nature of her overall illness and my concern that her multiple recent illnesses have taken a significant toll on her ability to continue to fight and the likelihood that it would be very difficult to bring about a meaningful lasting medical recovery.  I offered her the option of continuing conservative care along with attention being paid to comfort versus a pure comfort focused approach.  At this time she made it clear she wishes to continue a conservative medical approach.  She wishes to avoid any major invasive testing but is willing to continue with blood draws, x-ray testing, and the like as long as there is felt to be a reasonable chance it could stabilize her condition.  We have agreed to continue to investigate her current illness so as to hopefully better understand her current trajectory of illness.  She made it clear that should we come to the point that we feel further testing is futile that she would then support transitioning to  comfort focused care.  Assessment & Plan:  Transaminitis w/ coagulopathy  signif elevation in LFTs, w/ increase since admit - likely significant shock liver due to severe hypotension +/- amio toxicity - stop amio - stop Xarelto due to higher risk of bleeding - trend LFTs - check viral hepatitis panel for completeness - minimize tylenol dosing   Chronic Afib on anticoag Rate presently controlled - as above must stop amio and anticoag - watch on tele as for now pt desires ongoing med tx   COPD Well compensated at this time  Chronic combined systolic and diastolic CHF Dry wgt was 08XK at time of d/c 02/05/18 - TTE 02/03/18 noted EF 45-50% - despite increase in wgt since d/c, she does not clinically appear markedly overloaded at present   [employer]   02/18/18 1622 02/19/18 0525  Weight: 52.6 kg 60.3 kg    S/P bioprosthetic AoV replacement Checked by TTE earlier this month   Severe mitral regurg Noted on TTE 02/03/18  Mod/Severe tricuspid regurg Noted on TTE 02/03/18  DM2 w/ recurring hypoglycemia  Initiate dextrose IVF - ?due to acute hepatitis and very poor oral intake   Failure to thrive syndrome - debility  Pt likely in the process of dying - monitor over next 48-72hrs to better understand her trajectory  Severe malnutrition   DVT prophylaxis: SCDs Code Status: DNR - NO CODE Family Communication: spoke w/ sons at bedside and in hallway  Disposition Plan: tele bed - conservative medical care balanced w/ comfort as well w/ low threshold to transition to full  comfort care if declines further   Consultants:  Palliative Care   Antimicrobials:  Cefepime 10/28 Flagyl 10/28 > 10/29 Vanc 10/28  Objective: Blood pressure (!) 136/95, pulse (!) 105, temperature 97.9 F (36.6 C), temperature source Axillary, resp. rate (!) 22, height 5\' 7"  (1.702 m), weight 60.3 kg, SpO2 100 %.  Intake/Output Summary (Last 24 hours) at 02/19/2018 1536 Last data filed at 02/19/2018  0530 Gross per 24 hour  Intake 2555.42 ml  Output 550 ml  Net 2005.42 ml   Filed Weights   02/18/18 1622 02/19/18 0525  Weight: 52.6 kg 60.3 kg    Examination: General: No acute respiratory distress at rest in bed - thin - frail  Lungs: Clear to auscultation bilaterally without wheezes or crackles Cardiovascular: Regular ratenwithoutngallop or rub  Abdomen: Nontender, nondistended, soft, bowel sounds positive, no rebound, no ascites, no appreciable mass Extremities: No significant edema bilateral lower extremities  CBC: Recent Labs  Lab 02/18/18 1631 02/19/18 0612  WBC 19.8* 17.0*  NEUTROABS 16.7*  --   HGB 8.3* 7.5*  HCT 31.8* 26.5*  MCV 89.1 86.0  PLT 229 94*   Basic Metabolic Panel: Recent Labs  Lab 02/18/18 1631 02/19/18 0612  NA 140 140  K 4.8 3.9  CL 104 106  CO2 24 21*  GLUCOSE 60* 107*  BUN 23 32*  CREATININE 0.87 1.02*  CALCIUM 8.5* 7.9*   GFR: Estimated Creatinine Clearance: 39.8 mL/min (A) (by C-G formula based on SCr of 1.02 mg/dL (H)).  Liver Function Tests: Recent Labs  Lab 02/18/18 1631 02/19/18 0612  AST 949* 5,614*  ALT 1,132* 4,550*  ALKPHOS 134* 132*  BILITOT 2.1* 2.1*  PROT 5.8* 5.3*  ALBUMIN 3.0* 2.7*    Coagulation Profile: Recent Labs  Lab 02/18/18 1631  INR 2.22    CBG: Recent Labs  Lab 02/19/18 0448 02/19/18 0739 02/19/18 1118 02/19/18 1156 02/19/18 1250  GLUCAP 115* 82 47* 69* 143*    Recent Results (from the past 240 hour(s))  Blood Culture (routine x 2)     Status: None (Preliminary result)   Collection Time: 02/18/18  4:31 PM  Result Value Ref Range Status   Specimen Description BLOOD LEFT ANTECUBITAL  Final   Special Requests   Final    BOTTLES DRAWN AEROBIC AND ANAEROBIC Blood Culture results may not be optimal due to an inadequate volume of blood received in culture bottles   Culture   Final    NO GROWTH < 24 HOURS Performed at [facility], [place] [address], [place], [place] [address]     Report Status PENDING  Incomplete  Blood Culture (routine x 2)     Status: None (Preliminary result)   Collection Time: 02/18/18  5:04 PM  Result Value Ref Range Status   Specimen Description BLOOD RIGHT ANTECUBITAL  Final   Special Requests   Final    BOTTLES DRAWN AEROBIC AND ANAEROBIC Blood Culture adequate volume   Culture   Final    NO GROWTH < 24 HOURS Performed at [facility], [place] [address] [place], [place] [address]    Report Status PENDING  Incomplete     Scheduled Meds: . buPROPion  300 mg Oral Daily  . feeding supplement (ENSURE ENLIVE)  237 mL Oral BID BM  . gabapentin  600 mg Oral QHS  . insulin aspart  0-9 Units Subcutaneous TID WC     LOS: 1 day   Cherene Altes, MD [facility] Office  [phone]  Pager - Text Page per Shea Evans  If 7PM-7AM, please contact night-coverage per Amion 02/19/2018, 3:36 PM

## 2018-02-19 NOTE — ED Notes (Addendum)
Pt. Mentation is at baseline. MD paged for sugar of 25. Will continue to monitor and repeat blood sugar in 30 minutes.

## 2018-02-19 NOTE — ED Notes (Signed)
Pt transferred to hospital bed for comfort.

## 2018-02-19 NOTE — Progress Notes (Signed)
Hypoglycemic Event  CBG: 47  Treatment: 15 GM carbohydrate snack  Symptoms: None  Follow-up CBG: Time:1156 CBG Result: 69  Possible Reasons for Event: Inadequate meal intake  Comments/MD notified: Pt refusing further po intake.  Will give 57ml of D50 IV    Angela Hines

## 2018-02-19 NOTE — ED Notes (Addendum)
Pt. Reporting feeling very hot and tired. Will take blood sugar and repeat temperature.

## 2018-02-20 DIAGNOSIS — E860 Dehydration: Secondary | ICD-10-CM

## 2018-02-20 DIAGNOSIS — E440 Moderate protein-calorie malnutrition: Secondary | ICD-10-CM

## 2018-02-20 DIAGNOSIS — R627 Adult failure to thrive: Secondary | ICD-10-CM

## 2018-02-20 LAB — COMPREHENSIVE METABOLIC PANEL
ALBUMIN: 2.6 g/dL — AB (ref 3.5–5.0)
ALT: 5816 U/L — ABNORMAL HIGH (ref 0–44)
AST: 4192 U/L — AB (ref 15–41)
Alkaline Phosphatase: 150 U/L — ABNORMAL HIGH (ref 38–126)
Anion gap: 7 (ref 5–15)
BILIRUBIN TOTAL: 1.8 mg/dL — AB (ref 0.3–1.2)
BUN: 27 mg/dL — AB (ref 8–23)
CALCIUM: 8.4 mg/dL — AB (ref 8.9–10.3)
CO2: 24 mmol/L (ref 22–32)
Chloride: 109 mmol/L (ref 98–111)
Creatinine, Ser: 0.69 mg/dL (ref 0.44–1.00)
GFR calc Af Amer: >60 mL/min (ref >60)
GFR calc non Af Amer: >60 mL/min (ref >60)
GLUCOSE: 181 mg/dL — AB (ref 70–99)
Potassium: 3.5 mmol/L (ref 3.5–5.1)
SODIUM: 140 mmol/L (ref 135–145)
TOTAL PROTEIN: 5.2 g/dL — AB (ref 6.5–8.1)

## 2018-02-20 LAB — GLUCOSE, CAPILLARY
GLUCOSE-CAPILLARY: 145 mg/dL — AB (ref 70–99)
GLUCOSE-CAPILLARY: 131 mg/dL — AB (ref 70–99)
GLUCOSE-CAPILLARY: 146 mg/dL — AB (ref 70–99)
Glucose-Capillary: 210 mg/dL — ABNORMAL HIGH (ref 70–99)

## 2018-02-20 LAB — AMMONIA: AMMONIA: 56 umol/L — AB (ref 9–35)

## 2018-02-20 LAB — PROTIME-INR
INR: 4.35
Prothrombin Time: 40.9 seconds — ABNORMAL HIGH (ref 11.4–15.2)

## 2018-02-20 LAB — FOLATE: Folate: 31.1 ng/mL (ref >5.9)

## 2018-02-20 LAB — LACTIC ACID, PLASMA: Lactic Acid, Venous: 3.6 mmol/L (ref 0.5–1.9)

## 2018-02-20 LAB — VITAMIN B12: Vitamin B-12: 6234 pg/mL — ABNORMAL HIGH (ref 180–914)

## 2018-02-20 MED ORDER — ALBUTEROL SULFATE HFA 108 (90 BASE) MCG/ACT IN AERS: 2.0000 | INHALATION_SPRAY | Freq: Four times a day (QID) | RESPIRATORY_TRACT | Status: DC | PRN

## 2018-02-20 MED ORDER — MORPHINE SULFATE (CONCENTRATE) 10 MG/0.5ML PO SOLN: 5.0000 mg | ORAL | Status: DC | PRN

## 2018-02-20 MED ORDER — CEPHALEXIN 500 MG PO CAPS
500.0000 mg | ORAL_CAPSULE | Freq: Two times a day (BID) | ORAL | Status: DC
  Administered 2018-02-20 – 2018-02-23 (×7): 500 mg via ORAL
  Filled 2018-02-20 (×8): qty 1

## 2018-02-20 MED ORDER — PANTOPRAZOLE SODIUM 40 MG PO TBEC
40.0000 mg | DELAYED_RELEASE_TABLET | Freq: Every day | ORAL | Status: DC
  Administered 2018-02-20: 40 mg via ORAL
  Filled 2018-02-20: qty 1

## 2018-02-20 MED ORDER — LACTULOSE 10 GM/15ML PO SOLN: 20.0000 g | Freq: Two times a day (BID) | ORAL | Status: DC

## 2018-02-20 MED ORDER — METOPROLOL TARTRATE 25 MG PO TABS
25.0000 mg | ORAL_TABLET | Freq: Two times a day (BID) | ORAL | Status: DC
  Administered 2018-02-20 – 2018-02-21 (×2): 25 mg via ORAL
  Filled 2018-02-20 (×2): qty 1

## 2018-02-20 MED ORDER — ALBUTEROL SULFATE (2.5 MG/3ML) 0.083% IN NEBU: 2.5000 mg | INHALATION_SOLUTION | Freq: Four times a day (QID) | RESPIRATORY_TRACT | Status: DC | PRN

## 2018-02-20 NOTE — Progress Notes (Signed)
PROGRESS NOTE    Angela Hines  GXQ:[id number] DOB: [birth year] DOA: 02/18/2018 PCP: Kelton Pillar, MD   Brief Narrative:  84 year old with history of streptococcal bacteremia recently discharged on indefinite Keflex, urinary tract infection, arrhythmia, aortic stenosis, CHF, atrial fibrillation, COPD was brought to the hospital for evaluation of hypotension and change in mental status.  Patient responded to IV fluids.  She was also noted to be hypoglycemic which was quickly corrected.   Assessment & Plan:   Principal Problem:   Sepsis ([place]) Active Problems:   COPD (chronic obstructive pulmonary disease) (H[facility])   Diabetes mellitus ([place])   Gastroesophageal reflux disease without esophagitis   Hypertension   Hypotension   Hepatitis  Moderate to severe dehydration, improved Hypotension, resolved Acute metabolic encephalopathy, resolved Moderate to severe protein calorie malnutrition with failure to thrive - Patient is at very high risk of recurrent dehydration due to poor oral intake.  Due to her advanced age and comorbidities overall she has poor prognosis.  After an extensive discussion with the patient and the palliative care service it appears that patient does not want any heroic measures to be done.  At this time I highly encouraged the patient should be under hospice care.  Patient understands that her chronic issues due to natural aging process are not curable.  Transaminitis with coagulopathy, shock liver Elevated ammonia-hepatic encephalopathy - No obvious signs of bleeding.  Hepatitis panel sent-pending - Closely monitor INR.  Xarelto has been stopped. -Order lactulose to have 2-3 soft bowel movements daily-closely monitor for signs of dehydration  Atrial fibrillation with RVR - Noted the blood pressure is more stable, will resume metoprolol 25 mg twice daily.  Lopressor IV as necessary - Hold off on home amiodarone due to liver issues  COPD -Not an active  exacerbation.  She can do bronchodilators as necessary.  Chronic systolic and diastolic congestive heart failure, ejection fraction 45% -Echocardiogram done on 02/03/2018 showed ejection fraction 45%.  May have some signs of third space fluid collection but she is intravascularly volume depleted.  Likely from low albumin level, poor nutrition and immobility.  Status post bioprosthetic AV valve replacement Valvular disease-mitral regurgitation, tricuspid regurgitation  Recurrent hypoglycemia -Due to poor oral intake.  Encourage oral diet.  D5 half-normal saline 50 cc/h  Streptococcal bacteria, recent - Recently was planned during her previous admission that she would continue Keflex indefinitely twice a day.  DVT prophylaxis: Xarelto on hold due to elevated INR Code Status: DNR/DNI Family Communication: None at bedside Disposition Plan: Currently undergoing palliative care evaluation.  Patient understands that she has poor prognosis overall and does not want any aggressive life-saving measures.  Consultants:   Palliative care  Procedures:   None  Antimicrobials:   None   Subjective: No new complaints, continues to feel weak.  She understands her overall poor condition.  Review of Systems Otherwise negative except as per HPI, including: General: Denies fever, chills, night sweats or unintended weight loss. Resp: Denies cough, wheezing, shortness of breath. Cardiac: Denies chest pain, palpitations, orthopnea, paroxysmal nocturnal dyspnea. GI: Denies abdominal pain, nausea, vomiting, diarrhea or constipation GU: Denies dysuria, frequency, hesitancy or incontinence MS: Denies muscle aches, joint pain or swelling Neuro: Denies headache, neurologic deficits (focal weakness, numbness, tingling), abnormal gait Psych: Denies anxiety, depression, SI/HI/AVH Skin: Denies new rashes or lesions ID: Denies sick contacts, exotic exposures, travel  Objective: Vitals:   02/19/18 2023  02/20/18 0543 02/20/18 0749 02/20/18 1225  BP: (!) 119/91 112/87 (!) 140/109 (!) 127/95  Pulse:   (!) 128 (!) 112  Resp:   20 20  Temp:   98.5 F (36.9 C) (!) 97.5 F (36.4 C)  TempSrc: Oral  Oral Oral  SpO2:   100% 100%  Weight:  60.5 kg    Height:        Intake/Output Summary (Last 24 hours) at 02/20/2018 1305 Last data filed at 02/20/2018 0900 Gross per 24 hour  Intake 1156.11 ml  Output 750 ml  Net 406.11 ml   Filed Weights   02/18/18 1622 02/19/18 0525 02/20/18 0543  Weight: 52.6 kg 60.3 kg 60.5 kg    Examination:  General exam: Generally weak and frail-appearing, cachectic, chronically ill.  Bilateral temporal wasting. Respiratory system: Bibasilar crackles Cardiovascular system: S1 & S2 heard, RRR. No JVD, murmurs, rubs, gallops or clicks. No pedal edema. Gastrointestinal system: Abdomen is nondistended, soft and nontender. No organomegaly or masses felt. Normal bowel sounds heard. Central nervous system: Alert and oriented. No focal neurological deficits. Extremities: Symmetric 3+ x 5 power. Skin: No rashes, lesions or ulcers Psychiatry: Judgement and insight appear normal. Mood & affect appropriate.     Data Reviewed:   CBC: Recent Labs  Lab 02/18/18 1631 02/19/18 0612  WBC 19.8* 17.0*  NEUTROABS 16.7*  --   HGB 8.3* 7.5*  HCT 31.8* 26.5*  MCV 89.1 86.0  PLT 229 94*   Basic Metabolic Panel: Recent Labs  Lab 02/18/18 1631 02/19/18 0612 02/20/18 0421  NA 140 140 140  K 4.8 3.9 3.5  CL 104 106 109  CO2 24 21* 24  GLUCOSE 60* 107* 181*  BUN 23 32* 27*  CREATININE 0.87 1.02* 0.69  CALCIUM 8.5* 7.9* 8.4*   GFR: Estimated Creatinine Clearance: 50.9 mL/min (by C-G formula based on SCr of 0.69 mg/dL). Liver Function Tests: Recent Labs  Lab 02/18/18 1631 02/19/18 0612 02/20/18 0421  AST 949* 5,614* 4,192*  ALT 1,132* 4,550* 5,816*  ALKPHOS 134* 132* 150*  BILITOT 2.1* 2.1* 1.8*  PROT 5.8* 5.3* 5.2*  ALBUMIN 3.0* 2.7* 2.6*   No results  for input(s): LIPASE, AMYLASE in the last 168 hours. Recent Labs  Lab 02/20/18 0421  AMMONIA 56*   Coagulation Profile: Recent Labs  Lab 02/18/18 1631 02/20/18 0421  INR 2.22 4.35*   Cardiac Enzymes: No results for input(s): CKTOTAL, CKMB, CKMBINDEX, TROPONINI in the last 168 hours. BNP (last 3 results) No results for input(s): PROBNP in the last 8760 hours. HbA1C: No results for input(s): HGBA1C in the last 72 hours. CBG: Recent Labs  Lab 02/19/18 1250 02/19/18 1631 02/19/18 2105 02/20/18 0746 02/20/18 1228  GLUCAP 143* 139* 172* 145* 210*   Lipid Profile: No results for input(s): CHOL, HDL, LDLCALC, TRIG, CHOLHDL, LDLDIRECT in the last 72 hours. Thyroid Function Tests: No results for input(s): TSH, T4TOTAL, FREET4, T3FREE, THYROIDAB in the last 72 hours. Anemia Panel: Recent Labs    02/20/18 0421  VITAMINB12 6,234*  FOLATE 31.1   Sepsis Labs: Recent Labs  Lab 02/18/18 1643 02/18/18 1903 02/20/18 0421  LATICACIDVEN 5.85* 4.18* 3.6*    Recent Results (from the past 240 hour(s))  Blood Culture (routine x 2)     Status: None (Preliminary result)   Collection Time: 02/18/18  4:31 PM  Result Value Ref Range Status   Specimen Description BLOOD LEFT ANTECUBITAL  Final   Special Requests   Final    BOTTLES DRAWN AEROBIC AND ANAEROBIC Blood Culture results may not be optimal due to an inadequate volume of blood received  in culture bottles   Culture   Final    NO GROWTH 2 DAYS Performed at [facility], [place] [address] [place], [place] [address]    Report Status PENDING  Incomplete  Blood Culture (routine x 2)     Status: None (Preliminary result)   Collection Time: 02/18/18  5:04 PM  Result Value Ref Range Status   Specimen Description BLOOD RIGHT ANTECUBITAL  Final   Special Requests   Final    BOTTLES DRAWN AEROBIC AND ANAEROBIC Blood Culture adequate volume   Culture   Final    NO GROWTH 2 DAYS Performed at [facility], [place] [address], [place], [place] [address]    Report Status PENDING  Incomplete         Radiology Studies: Dg Chest Port 1 View  Result Date: 02/18/2018 CLINICAL DATA:  Sepsis. EXAM: PORTABLE CHEST 1 VIEW COMPARISON:  02/01/2018 and prior radiograph FINDINGS: Cardiomegaly, cardiac valve replacement and loop recorder again noted. Mild peribronchial thickening is unchanged There is no evidence of focal airspace disease, pulmonary edema, suspicious pulmonary nodule/mass, pleural effusion, or pneumothorax. No acute bony abnormalities are identified. IMPRESSION: Cardiomegaly without evidence of acute cardiopulmonary disease Electronically Signed   By: Margarette Canada M.D.   On: 02/18/2018 17:29        Scheduled Meds: . buPROPion  300 mg Oral Daily  . feeding supplement (ENSURE ENLIVE)  237 mL Oral BID BM  . gabapentin  300 mg Oral QHS  . insulin aspart  0-9 Units Subcutaneous TID WC  . metoprolol tartrate  2.5 mg Intravenous Q6H   Continuous Infusions: . dextrose 5 % and 0.45% NaCl 50 mL/hr at 02/19/18 1642     LOS: 2 days   Time spent= 35  mins    Ankit Arsenio Loader, MD [facility] Pager [phone]   If 7PM-7AM, please contact night-coverage [email or web address] Password [email or web address] 02/20/2018, 1:05 PM

## 2018-02-20 NOTE — Consult Note (Signed)
Consultation Note Date: 02/20/2018   Patient Name: Angela Hines  DOB: [birth year]  MRN: [id number]  Age / Sex: 84 y.o., female  PCP: Kelton Pillar, MD Referring Physician: Damita Lack, MD  Reason for Consultation: Establishing goals of care, Non pain symptom management and Psychosocial/spiritual support  HPI/Patient Profile: 84 y.o. female   admitted on 02/18/2018 with a past medical history  history of streptococcal bacteremia recently discharged on indefinite Keflex, urinary tract infection, arrhythmia, aortic stenosis, CHF, atrial fibrillation, COPD was brought to the hospital for evaluation of hypotension and change in mental status.    Patient has had multiple rehospitalizations over the last 6 months.  Family report continued physical, functional decline.  Family has met with palliative medicine in the past as they continue to process the patient's high risk for decompensation and long-term poor prognosis.  Family once again face treatment option decisions, advanced directive decisions and anticipatory care needs.    Clinical Assessment and Goals of Care:  This NP Wadie Lessen reviewed medical records, received report from team, assessed the patient and then meet at the patient's bedside along with her son/ Shanon Brow  to discuss diagnosis, prognosis, GOC, EOL wishes disposition and options.  Concept of Hospice and Palliative Care were discussed  A detailed discussion was had today regarding advanced directives.  Concepts specific to code status, artifical feeding and hydration, continued IV antibiotics and rehospitalization was had.  The difference between a aggressive medical intervention path  and a palliative comfort care path for this patient at this time was had.  Values and goals of care important to patient and family were attempted to be elicited.   Questions and concerns addressed.    Family encouraged to call with questions or concerns.    PMT will continue to support holistically.  Plan is to meet with both sons in the [place] at 0930 for further clarification of [place].     Primary Decision Maker: HealthCare power of attorney/son [place] OF RECOMMENDATIONS    Code Status/Advance Care Planning:  DNR   Symptom Management:   Dyspnea/pain: Morphine 2-4 mg IV every 1 hr prn  Anxiety: Ativan 0.5 to 1 mg IV every 3 hours as needed  Palliative Prophylaxis:   Aspiration, Bowel Regimen, Delirium Protocol and Oral Care  Additional Recommendations (Limitations, Scope, Preferences):  Today family wishes to continue with medical management of treatable disease.  We will re-meet tomorrow morning to further clarify goals of care within the context of comfort and dignity.  Psycho-social/Spiritual:   Desire for further Chaplaincy support:yes  Additional Recommendations: Education on Hospice  Prognosis:   < 2 weeks  Discharge Planning: To Be Determined      Primary Diagnoses: Present on Admission: . Sepsis ([place]) . COPD (chronic obstructive pulmonary disease) ([place]) . Gastroesophageal reflux disease without esophagitis . Hypertension . Hypotension . Hepatitis   I have reviewed the medical record, interviewed the patient and family, and examined the patient. The following aspects are pertinent.  Past Medical  History:  Diagnosis Date  . Anginal pain ([place]) ~ 2007   "mini stroke affected her right eye; made it droop; fully recovered" (01/31/2018)  . Anxiety   . Aortic stenosis   . Arthritis    "joints" (01/31/2018)  . Atrial fibrillation ([place])   . CHF (congestive heart failure) ([place])   . Chronic bronchitis ([place])   . COPD (chronic obstructive pulmonary disease) ([place])   . Depressed affect   . Depression   . GERD (gastroesophageal reflux disease)   . Headache    "monthly" (01/31/2018)  . High cholesterol   . Hypertension   . Migraine     "none since <1990" (01/31/2018)  . Paroxysmal atrial fibrillation ([facility])   . Pneumonia 2019  . Type II diabetes mellitus ([place])    "recently dc'd Metformin" (01/31/2018)   Social History   Socioeconomic History  . Marital status: Widowed    Spouse name: Not on file  . Number of children: Not on file  . Years of education: Not on file  . Highest education level: Not on file  Occupational History  . Not on file  Social Needs  . Financial resource strain: Not on file  . Food insecurity:    Worry: Not on file    Inability: Not on file  . Transportation needs:    Medical: Not on file    Non-medical: Not on file  Tobacco Use  . Smoking status: Never Smoker  . Smokeless tobacco: Never Used  Substance and Sexual Activity  . Alcohol use: Not Currently    Frequency: Never    Comment: 01/31/2018 "did drink ~ 1 glass of wine/wk; nothing in the last 6 months or more"  . Drug use: Never  . Sexual activity: Never  Lifestyle  . Physical activity:    Days per week: Not on file    Minutes per session: Not on file  . Stress: Not on file  Relationships  . Social connections:    Talks on phone: Not on file    Gets together: Not on file    Attends religious service: Not on file    Active member of club or organization: Not on file    Attends meetings of clubs or organizations: Not on file    Relationship status: Not on file  Other Topics Concern  . Not on file  Social History Narrative  . Not on file   Family History  Problem Relation Age of Onset  . Emphysema Father   . Hypertension Sister   . Lung disease Sister   . Hypertension Brother   . Hypertension Brother   . Stroke Brother   . Other Son        one spleen removed, one with chronic pain from MVA  . Diabetes Son   . Multiple myeloma Son   . Heart disease Son   . Heart attack Son    Scheduled Meds: . buPROPion  300 mg Oral Daily  . feeding supplement (ENSURE ENLIVE)  237 mL Oral BID BM  . gabapentin  300 mg Oral  QHS  . insulin aspart  0-9 Units Subcutaneous TID WC  . metoprolol tartrate  2.5 mg Intravenous Q6H   Continuous Infusions: . dextrose 5 % and 0.45% NaCl 50 mL/hr at 02/19/18 1642   PRN Meds:.LORazepam, morphine injection, ondansetron **OR** ondansetron (ZOFRAN) IV Medications Prior to Admission:  Prior to Admission medications   Medication Sig Start Date End Date Taking? Authorizing Provider  albuterol (PROVENTIL HFA;VENTOLIN HFA)  108 (90 Base) MCG/ACT inhaler Inhale 2 puffs into the lungs every 6 (six) hours as needed for shortness of breath.   Yes [provider]  amiodarone (PACERONE) 100 MG tablet Take 100 mg by mouth daily.   Yes [provider]  buPROPion (WELLBUTRIN XL) 300 MG 24 hr tablet Take 300 mg by mouth daily.  06/20/17  Yes [provider]  cephALEXin (KEFLEX) 500 MG capsule Take 1 capsule (500 mg total) by mouth 2 (two) times daily. No stop date Patient taking differently: Take 500 mg by mouth 2 (two) times daily. No stop date (for streptococcal bacteremia) 02/10/18 12/07/18 Yes Thurnell Lose, MD  furosemide (LASIX) 20 MG tablet Take 1 tablet (20 mg total) by mouth daily. 10/23/17  Yes Daune Perch, NP  gabapentin (NEURONTIN) 600 MG tablet Take 600 mg by mouth at bedtime.  07/17/16  Yes [provider]  LORazepam (ATIVAN) 0.5 MG tablet Take 1 tablet (0.5 mg total) by mouth 3 (three) times daily as needed for anxiety. Patient taking differently: Take 0.5 mg by mouth every 8 (eight) hours as needed for anxiety.  11/14/17  Yes Upstill, Nehemiah Settle, PA-C  magnesium oxide (MAG-OX) 400 (241.3 Mg) MG tablet Take 1 tablet (400 mg total) by mouth daily. 05/24/17  Yes Nita Sells, MD  metFORMIN (GLUCOPHAGE) 1000 MG tablet Take 1,000 mg by mouth daily. 12/01/16  Yes [provider]  metoprolol tartrate (LOPRESSOR) 25 MG tablet Take 1 tablet (25 mg total) by mouth 2 (two) times daily. 02/14/18  Yes Allred, Jeneen Rinks, MD  Nutritional Supplements  (NUTRITIONAL SUPPLEMENT PO) Take 120 mLs by mouth 3 (three) times daily. MedPass 2.0   Yes [provider]  omeprazole (PRILOSEC) 40 MG capsule Take 40 mg by mouth daily at 6 (six) AM.    Yes [provider]  polyethylene glycol (MIRALAX / GLYCOLAX) packet Take 17 g by mouth daily. Patient taking differently: Take 17 g by mouth See admin instructions. Mix 17 g in 4-6 oz fluid and drink daily for constipation, may also take 17 g mixed in 4-6 oz fluid daily as needed for constipation. 12/20/17  Yes Donne Hazel, MD  predniSONE (DELTASONE) 5 MG tablet Label  & dispense according to the schedule below. 10 Pills PO for 3 days then, 8 Pills PO for 3 days, 6 Pills PO for 3 days, 4 Pills PO for 3 days, 2 Pills PO for 3 days, 1 Pills PO for 3 days, 1/2 Pill  PO for 3 days then STOP. Total 95 pills. Patient taking differently: Take 2.5-50 mg by mouth See admin instructions. Tapered course ordered 02/05/18: take 10 tablets (50 mg) by mouth daily for 3 days, then take 8 tablets (40 mg) daily for 3 days, then take 6 tablets (30 mg) daily for 3 days, then take 4 tablets (20 mg) daily for 3 days, then take 2 tablets (10 mg) daily for 3 days, then take 1 tablet (5 mg) daily for 3 days, then take 1/2 tablet (2.5 mg) daily for 3 days, then stop 02/05/18  Yes Thurnell Lose, MD  Rivaroxaban (XARELTO) 15 MG TABS tablet Take 15 mg by mouth daily with supper. 07/17/16  Yes [provider]   Allergies  Allergen Reactions  . Clonidine Swelling and Other (See Comments)    "head swelling"  . Captopril Swelling  . Codeine Nausea And Vomiting and Other (See Comments)    GI Intolerance   . Ampicillin Rash  . Ceclor [Cefaclor] Rash  Review of Systems  Respiratory: Positive for shortness of breath.   Neurological: Positive for weakness.    Physical Exam  Constitutional: She appears lethargic. She appears cachectic. She appears ill. Nasal cannula in place.  Cardiovascular: Tachycardia  present.  Pulmonary/Chest: She has decreased breath sounds.  Neurological: She appears lethargic.    Vital Signs: BP (!) 140/109 (BP Location: Left Arm)   Pulse (!) 128   Temp 98.5 F (36.9 C) (Oral)   Resp 20   Ht 5' 7"  (1.702 m)   Wt 60.5 kg   SpO2 100%   BMI 20.89 kg/m  Pain Scale: 0-10   Pain Score: 0-No pain   SpO2: SpO2: 100 % O2 Device:SpO2: 100 % O2 Flow Rate: .O2 Flow Rate (L/min): 2 L/min  IO: Intake/output summary:   Intake/Output Summary (Last 24 hours) at 02/20/2018 0949 Last data filed at 02/20/2018 0700 Gross per 24 hour  Intake 1276.11 ml  Output 750 ml  Net 526.11 ml    LBM: Last BM Date: 02/16/18 Baseline Weight: Weight: 52.6 kg Most recent weight: Weight: 60.5 kg     Palliative Assessment/Data: 30 % at best   Discussed with Dr Reesa Chew  Time In: 1200 Time Out: 1315 Time Total: 75 minutes Greater than 50%  of this time was spent counseling and coordinating care related to the above assessment and plan.  Signed by: Wadie Lessen, NP   Please contact Palliative Medicine Team phone at [phone] for questions and concerns.  For individual provider: See Shea Evans

## 2018-02-20 NOTE — Progress Notes (Signed)
   02/20/18 1000  Clinical Encounter Type  Visited With Patient  Visit Type Follow-up  Referral From Nurse;Social work  Spiritual Encounters  Spiritual Needs Emotional;Prayer  Stress Factors  Patient Stress Factors Health changes;Family relationships  Visited with patient after nurse's huddle. Per CSW inquired about Spiritual Care consult. [place] consult have been placed for Palliative but not Spiritual Care thus I visited with Patient. Patient was alert and had just finished breakfast. She was oriented to self, place, time and health situation. Patient said she is ready if it is her time. She states that she is of the [facility]. She indicated that she was a little afraid of dying because she was concerned for her children's welfare. Chaplain comforted the patient with sacred text and had a brief discussion about her sharing her concerns to God in Prayer. She states that she prays often. Chaplain expressed that [place] lifts up all [place] patients in prayer daily and on Wednesdays at [place]. Patient said thank you and that is what she need. Patient expressed feeling tired and Chaplain informed her that if she desire to talk again to contact nurse and  will return. Provided spiritual and emotional support. Will follow-up as needed. Chaplain Matthew Folks [phone]

## 2018-02-21 DIAGNOSIS — Z66 Do not resuscitate: Secondary | ICD-10-CM

## 2018-02-21 DIAGNOSIS — R0609 Other forms of dyspnea: Secondary | ICD-10-CM

## 2018-02-21 DIAGNOSIS — R6520 Severe sepsis without septic shock: Secondary | ICD-10-CM

## 2018-02-21 DIAGNOSIS — E11649 Type 2 diabetes mellitus with hypoglycemia without coma: Secondary | ICD-10-CM

## 2018-02-21 DIAGNOSIS — A400 Sepsis due to streptococcus, group A: Secondary | ICD-10-CM

## 2018-02-21 DIAGNOSIS — I959 Hypotension, unspecified: Secondary | ICD-10-CM

## 2018-02-21 DIAGNOSIS — K219 Gastro-esophageal reflux disease without esophagitis: Secondary | ICD-10-CM

## 2018-02-21 DIAGNOSIS — J42 Unspecified chronic bronchitis: Secondary | ICD-10-CM

## 2018-02-21 DIAGNOSIS — K759 Inflammatory liver disease, unspecified: Secondary | ICD-10-CM

## 2018-02-21 DIAGNOSIS — R0600 Dyspnea, unspecified: Secondary | ICD-10-CM

## 2018-02-21 DIAGNOSIS — Z515 Encounter for palliative care: Secondary | ICD-10-CM

## 2018-02-21 LAB — HEPATITIS PANEL, ACUTE
HEP A IGM: NEGATIVE
HEP B S AG: NEGATIVE
Hep B C IgM: NEGATIVE

## 2018-02-21 LAB — GLUCOSE, CAPILLARY
GLUCOSE-CAPILLARY: 25 mg/dL — AB (ref 70–99)
Glucose-Capillary: 71 mg/dL (ref 70–99)
Glucose-Capillary: 173 mg/dL — ABNORMAL HIGH (ref 70–99)

## 2018-02-21 NOTE — Progress Notes (Signed)
CSW consulted with palliative NP, Stanton Kidney, and discussed that patient and family's wish is for residential hospice. CSW confirmed with patient's son, Legrand Como, on the phone, that they would prefer [facility]. CSW made referral to Bevely Palmer at [facility]. Awaiting referral review and bed availability at [facility]. CSW to follow and support.  Estanislado Emms, [place]

## 2018-02-21 NOTE — Progress Notes (Addendum)
PROGRESS NOTE    Angela Hines  HLK:[id number] DOB: [birth year] DOA: 02/18/2018 PCP: Angela Pillar, MD   Brief Narrative:  HPI On 02/18/2018 Dr. Gala Romney Bibiana Hines is a 84 y.o. female with medical history significant of recent hospitalization with streptococcal bacteremia, UTI, arrhythmias, CHF, aortic stenosis, GERD, atrial fibrillation, COPD who was just discharged from the hospital and is now a DNR.  She was brought in from her facility by her son with acute cardiorespiratory failure sepsis type syndrome with no obvious source of sepsis.  Patient was profoundly hypotensive altered mental status was elected a more than 5.  She was immediately given IV fluids to which she responded.  She was hypoglycemic and overall in respiratory failure requiring BiPAP.  During last hospitalization hospice was discussed.  Son is with the patient and not sure if he wanted patient treated or just hospice care.  He asked for patient's treatment at this point however they want to transition to hospice home from tomorrow.  Patient is therefore being admitted on BiPAP.  Interim history  Admitted for respiratory failure.  Found to have moderate to severe dehydration as well as hypotension and acute metabolic encephalopathy which have resolved.  Also noted to have transaminitis with coagulopathy and shock liver.  Palliative care consulted and appreciated, planning for more comfort measures.  Assessment & Plan   Moderate to severe dehydration -Improved  Hypotension -Resolved -Secondary to dehydration  Acute metabolic encephalopathy -Resolved, patient appears to be alert and oriented  Moderate severe protein calorie malnutrition/failure to thrive -Presented with dehydration due to poor oral intake  Transaminitis with coagulopathy/shock liver/elevated ammonia and hepatic encephalopathy -No signs of bleeding -?Drug induced -Hepatitis panel negative -Xarelto discontinued -Continue  lactulose -Blood cultures show no growth  Atrial fibrillation with RVR -Improving, continue metoprolol and IV Lopressor as needed -Amiodarone discontinued due to liver issues  COPD -Stable, continue bronchodilators as necessary  Chronic systolic and diastolic heart failure -Echocardiogram 02/03/2018 showed EF of 45% -Monitor intake and output, daily weights  Valvular disease -Status post bioprosthetic AV valve replacement, mitral regurg, tricuspid regurg  Hypoglycemia -Due to poor oral intake -Continue D5 half-normal saline -Encouraged oral diet  Recent streptococcal bacterial infection -Patient to continue Keflex indefinitely twice daily  Goals of care/CODE STATUS -She confirms that she does not want heroic measures.  Currently DNR -Patient understands that she has poor prognosis -Palliative care consulted and appreciated, hopeless and transition to residential hospice for end-of-life care -Social work consulted for [facility] place placement  DVT Prophylaxis  SCDs  Code Status: DNR  Family Communication: None at bedside  Disposition Plan: Admitted. Pending Hospice placement  Consultants Palliative care  Procedures  None  Antibiotics   Anti-infectives (From admission, onward)   Start     Dose/Rate Route Frequency Ordered Stop   02/20/18 1400  cephALEXin (KEFLEX) capsule 500 mg    Note to Pharmacy:  OP SIG:No stop date Patient taking differently: No stop date (for streptococcal bacteremia)     500 mg Oral 2 times daily 02/20/18 1321     02/19/18 1700  vancomycin (VANCOCIN) IVPB 1000 mg/200 mL premix  Status:  Discontinued     1,000 mg 200 mL/hr over 60 Minutes Intravenous Every 24 hours 02/18/18 1810 02/19/18 1625   02/19/18 1700  ceFEPIme (MAXIPIME) 1 g in sodium chloride 0.9 % 100 mL IVPB  Status:  Discontinued     1 g 200 mL/hr over 30 Minutes Intravenous Every 24 hours 02/18/18 1810 02/19/18 1625  02/18/18 1645  ceFEPIme (MAXIPIME) 2 g in sodium chloride 0.9  % 100 mL IVPB     2 g 200 mL/hr over 30 Minutes Intravenous  Once 02/18/18 1640 02/18/18 1801   02/18/18 1630  aztreonam (AZACTAM) 2 g in sodium chloride 0.9 % 100 mL IVPB  Status:  Discontinued     2 g 200 mL/hr over 30 Minutes Intravenous  Once 02/18/18 1620 02/18/18 1640   02/18/18 1630  metroNIDAZOLE (FLAGYL) IVPB 500 mg  Status:  Discontinued     500 mg 100 mL/hr over 60 Minutes Intravenous Every 8 hours 02/18/18 1620 02/19/18 1626   02/18/18 1630  vancomycin (VANCOCIN) IVPB 1000 mg/200 mL premix     1,000 mg 200 mL/hr over 60 Minutes Intravenous  Once 02/18/18 1620 02/18/18 1812      Subjective:   Angela Hines seen and examined today.  Has no complaints this morning.  Feels weak.  Denies current chest pain, shortness of breath, bone pain, nausea vomiting, diarrhea constipation.  Objective:   Vitals:   02/20/18 1932 02/21/18 0504 02/21/18 0634 02/21/18 1040  BP: (!) 126/92 (!) 145/89 (!) 145/89 (!) 141/94  Pulse:  (!) 109 (!) 113 (!) 115  Resp:   19   Temp: 97.6 F (36.4 C) (!) 97.3 F (36.3 C) 97.9 F (36.6 C)   TempSrc: Oral Oral Oral   SpO2:  100% 100%   Weight:  60.1 kg    Height:        Intake/Output Summary (Last 24 hours) at 02/21/2018 1217 Last data filed at 02/21/2018 1138 Gross per 24 hour  Intake 250 ml  Output -  Net 250 ml   Filed Weights   02/19/18 0525 02/20/18 0543 02/21/18 0504  Weight: 60.3 kg 60.5 kg 60.1 kg    Exam  General: Well developed, frail, cachectic, chronically ill-appearing, NAD  HEENT: NCAT, mucous membranes moist.   Neck: Supple  Cardiovascular: S1 S2 auscultated, 3/6 SEM, irregularly irregular  Respiratory: Diminished but clear   Abdomen: Soft, nontender, nondistended, + bowel sounds  Extremities: warm dry without cyanosis clubbing or edema  Neuro: AAOx3, nonfocal, 3 out of 5 strength  Skin: Without rashes exudates or nodules; ecchymosis/bruising chest, arms, legs  Psych: appropriate mood and affect   Data  Reviewed: I have personally reviewed following labs and imaging studies  CBC: Recent Labs  Lab 02/18/18 1631 02/19/18 0612  WBC 19.8* 17.0*  NEUTROABS 16.7*  --   HGB 8.3* 7.5*  HCT 31.8* 26.5*  MCV 89.1 86.0  PLT 229 94*   Basic Metabolic Panel: Recent Labs  Lab 02/18/18 1631 02/19/18 0612 02/20/18 0421  NA 140 140 140  K 4.8 3.9 3.5  CL 104 106 109  CO2 24 21* 24  GLUCOSE 60* 107* 181*  BUN 23 32* 27*  CREATININE 0.87 1.02* 0.69  CALCIUM 8.5* 7.9* 8.4*   GFR: Estimated Creatinine Clearance: 50.6 mL/min (by C-G formula based on SCr of 0.69 mg/dL). Liver Function Tests: Recent Labs  Lab 02/18/18 1631 02/19/18 0612 02/20/18 0421  AST 949* 5,614* 4,192*  ALT 1,132* 4,550* 5,816*  ALKPHOS 134* 132* 150*  BILITOT 2.1* 2.1* 1.8*  PROT 5.8* 5.3* 5.2*  ALBUMIN 3.0* 2.7* 2.6*   No results for input(s): LIPASE, AMYLASE in the last 168 hours. Recent Labs  Lab 02/20/18 0421  AMMONIA 56*   Coagulation Profile: Recent Labs  Lab 02/18/18 1631 02/20/18 0421  INR 2.22 4.35*   Cardiac Enzymes: No results for input(s): CKTOTAL, CKMB,  CKMBINDEX, TROPONINI in the last 168 hours. BNP (last 3 results) No results for input(s): PROBNP in the last 8760 hours. HbA1C: No results for input(s): HGBA1C in the last 72 hours. CBG: Recent Labs  Lab 02/20/18 1228 02/20/18 1709 02/20/18 2113 02/21/18 0749 02/21/18 1127  GLUCAP 210* 131* 146* 71 173*   Lipid Profile: No results for input(s): CHOL, HDL, LDLCALC, TRIG, CHOLHDL, LDLDIRECT in the last 72 hours. Thyroid Function Tests: No results for input(s): TSH, T4TOTAL, FREET4, T3FREE, THYROIDAB in the last 72 hours. Anemia Panel: Recent Labs    02/20/18 0421  VITAMINB12 6,234*  FOLATE 31.1   Urine analysis:    Component Value Date/Time   COLORURINE YELLOW 02/18/2018 1930   APPEARANCEUR CLEAR 02/18/2018 1930   LABSPEC 1.010 02/18/2018 1930   PHURINE 7.0 02/18/2018 1930   GLUCOSEU NEGATIVE 02/18/2018 1930   HGBUR  NEGATIVE 02/18/2018 1930   [place] NEGATIVE 02/18/2018 1930   [place] NEGATIVE 02/18/2018 1930   PROTEINUR NEGATIVE 02/18/2018 1930   NITRITE NEGATIVE 02/18/2018 1930   LEUKOCYTESUR NEGATIVE 02/18/2018 1930   Sepsis Labs: @LABRCNTIP (procalcitonin:4,lacticidven:4)  ) Recent Results (from the past 240 hour(s))  Blood Culture (routine x 2)     Status: None (Preliminary result)   Collection Time: 02/18/18  4:31 PM  Result Value Ref Range Status   Specimen Description BLOOD LEFT ANTECUBITAL  Final   Special Requests   Final    BOTTLES DRAWN AEROBIC AND ANAEROBIC Blood Culture results may not be optimal due to an inadequate volume of blood received in culture bottles   Culture   Final    NO GROWTH 2 DAYS Performed at [facility], [place] [address] [place], [place] [address]    Report Status PENDING  Incomplete  Blood Culture (routine x 2)     Status: None (Preliminary result)   Collection Time: 02/18/18  5:04 PM  Result Value Ref Range Status   Specimen Description BLOOD RIGHT ANTECUBITAL  Final   Special Requests   Final    BOTTLES DRAWN AEROBIC AND ANAEROBIC Blood Culture adequate volume   Culture   Final    NO GROWTH 2 DAYS Performed at [facility], [place] [address], [place], [place] [address]    Report Status PENDING  Incomplete      Radiology Studies: No results found.   Scheduled Meds: . cephALEXin  500 mg Oral BID  . feeding supplement (ENSURE ENLIVE)  237 mL Oral BID BM   Continuous Infusions: . dextrose 5 % and 0.45% NaCl 50 mL/hr at 02/19/18 1642     LOS: 3 days   Time Spent in minutes   30 minutes  Vittorio Mohs D.O. on 02/21/2018 at 12:17 PM  Between 7am to 7pm - Please see pager noted on [email or web address]  After 7pm go to [email or web address]  And look for the night coverage person covering for me after hours  [facility] Office  [phone]

## 2018-02-21 NOTE — Progress Notes (Signed)
Patient ID: Shara Hartis, female   DOB: 04-20-1933, 84 y.o.   MRN: [id number]  This NP visited patient at the bedside as a follow up to  yesterday's GOCs meeting palliative medicine needs and emotional support.  Patient's sons Michael/H POA and Shanon Brow along with the patient's daughter-in-law Edmonia James are all present at the bedside.  Patient is weaker today with continued dyspnea.  She is taking minimal oral intake.  Continued conversation regarding diagnosis, prognosis, goals of care, end-of-life wishes, disposition and options was had.  All family verbalize their understanding of the seriousness of the current medical situation and the limited prognosis.  Their main focus of care is comfort, quality and dignity.  Plan of care: -DNR/DNI -No artificial feeding or hydration now or in the future -Medications except for those that enhance comfort      Morphine/Ativan - No further diagnostics or life prolonging measures -Hope is to transition to residential hospice for end-of-life care/ will write for choice      Prognosis is days to weeks  Natural trajectory and expectations at end of life discussed.  Questions and concerns addressed.  Palliative medicine will continue to support holistically  Emotional support offered to family.  Created space and opportunity for family to share patient's life review and love and appreciation for Ms. [name]  Discussed  with social work and bedside nursing  Total time spent on the unit was 45 minutes  Greater than 50% of the time was spent in counseling and coordination of care  Wadie Lessen NP  Palliative Medicine Team Team Phone # [phone] Pager [phone]

## 2018-02-21 NOTE — Progress Notes (Signed)
Hospice and [facility])  Received request from [place] for residential hospice at [facility].  Chart reviewed and spoke with family to acknowledge referral.  Unfortunately, [facility] is not able to offer a room today.  Family and CSW aware [facility] Liaison will follow up with CSW and family tomorrow or sooner if room becomes available.    Please do not hesitate to call with any hospice related questions  Thank you, Venia Carbon BSN, [place] (listed in [place]) [phone]

## 2018-02-21 NOTE — Care Management Important Message (Signed)
Important Message  Patient Details  Name: Angela Hines MRN: [id number] Date of Birth: [birth year]   Medicare Important Message Given:  Yes    Barb Merino Nusayba Cadenas 02/21/2018, 4:33 PM

## 2018-02-22 DIAGNOSIS — I10 Essential (primary) hypertension: Secondary | ICD-10-CM

## 2018-02-22 NOTE — Progress Notes (Signed)
[facility] does not have a bed available for patient today. CSW to follow and support with discharge when hospice bed available.  Estanislado Emms, [place]

## 2018-02-22 NOTE — Care Management Note (Signed)
Case Management Note  Patient Details  Name: Nelli Swalley MRN: [id number] Date of Birth: 11-Apr-1933  Subjective/Objective: Pt presented for Sepsis- Plan will be for [facility]. CSW assisting with disposition needs.                    Action/Plan: CM will continue to monitor for any additional disposition needs.   Expected Discharge Date:                  Expected Discharge Plan:  [place]  In-House Referral:  Clinical Social Work  Discharge planning Services  CM Consult  Post Acute Care Choice:  NA Choice offered to:  NA  DME Arranged:  N/A DME Agency:  NA  HH Arranged:  NA HH Agency:  NA  Status of Service:  Completed, signed off  If discussed at [place] of Stay Meetings, dates discussed:    Additional Comments:  Bethena Roys, RN 02/22/2018, 11:29 AM

## 2018-02-22 NOTE — Progress Notes (Signed)
PROGRESS NOTE    Angela Hines  PTW:[id number] DOB: 1932/08/29 DOA: 02/18/2018 PCP: Kelton Pillar, MD   Brief Narrative:  HPI On 02/18/2018 Dr. Gala Romney [name] is a 84 y.o. female with medical history significant of recent hospitalization with streptococcal bacteremia, UTI, arrhythmias, CHF, aortic stenosis, GERD, atrial fibrillation, COPD who was just discharged from the hospital and is now a DNR.  She was brought in from her facility by her son with acute cardiorespiratory failure sepsis type syndrome with no obvious source of sepsis.  Patient was profoundly hypotensive altered mental status was elected a more than 5.  She was immediately given IV fluids to which she responded.  She was hypoglycemic and overall in respiratory failure requiring BiPAP.  During last hospitalization hospice was discussed.  Son is with the patient and not sure if he wanted patient treated or just hospice care.  He asked for patient's treatment at this point however they want to transition to hospice home from tomorrow.  Patient is therefore being admitted on BiPAP.  Interim history  Admitted for respiratory failure.  Found to have moderate to severe dehydration as well as hypotension and acute metabolic encephalopathy which have resolved.  Also noted to have transaminitis with coagulopathy and shock liver.  Palliative care consulted and appreciated, planning for more comfort measures. Pending residential hospice placement.  Assessment & Plan   Moderate to severe dehydration -Improved  Hypotension -Resolved -Secondary to dehydration  Acute metabolic encephalopathy -Resolved, patient appears to be alert and oriented  Moderate severe protein calorie malnutrition/failure to thrive -Presented with dehydration due to poor oral intake  Transaminitis with coagulopathy/shock liver/elevated ammonia and hepatic encephalopathy -No signs of bleeding -?Drug induced -Hepatitis panel  negative -Xarelto discontinued -Continue lactulose -Blood cultures show no growth  Atrial fibrillation with RVR -Improving, continue metoprolol and IV Lopressor as needed -Amiodarone discontinued due to liver issues  COPD -Stable, continue bronchodilators as necessary  Chronic systolic and diastolic heart failure -Echocardiogram 02/03/2018 showed EF of 45% -Monitor intake and output, daily weights  Valvular disease -Status post bioprosthetic AV valve replacement, mitral regurg, tricuspid regurg  Hypoglycemia -Due to poor oral intake -Continue D5 half-normal saline -Encouraged oral diet  Recent streptococcal bacterial infection -Patient to continue Keflex indefinitely twice daily  Goals of care/CODE STATUS -She confirms that she does not want heroic measures.  Currently DNR -Patient understands that she has poor prognosis -Palliative care consulted and appreciated, hopeless and transition to residential hospice for end-of-life care -Social work consulted for [facility] place placement- pending bed availability.   DVT Prophylaxis  SCDs  Code Status: DNR  Family Communication: Family at bedside  Disposition Plan: Admitted. Pending Hospice placement  Consultants Palliative care  Procedures  None  Antibiotics   Anti-infectives (From admission, onward)   Start     Dose/Rate Route Frequency Ordered Stop   02/20/18 1400  cephALEXin (KEFLEX) capsule 500 mg    Note to Pharmacy:  OP SIG:No stop date Patient taking differently: No stop date (for streptococcal bacteremia)     500 mg Oral 2 times daily 02/20/18 1321     02/19/18 1700  vancomycin (VANCOCIN) IVPB 1000 mg/200 mL premix  Status:  Discontinued     1,000 mg 200 mL/hr over 60 Minutes Intravenous Every 24 hours 02/18/18 1810 02/19/18 1625   02/19/18 1700  ceFEPIme (MAXIPIME) 1 g in sodium chloride 0.9 % 100 mL IVPB  Status:  Discontinued     1 g 200 mL/hr over 30 Minutes Intravenous Every  24 hours 02/18/18 1810  02/19/18 1625   02/18/18 1645  ceFEPIme (MAXIPIME) 2 g in sodium chloride 0.9 % 100 mL IVPB     2 g 200 mL/hr over 30 Minutes Intravenous  Once 02/18/18 1640 02/18/18 1801   02/18/18 1630  aztreonam (AZACTAM) 2 g in sodium chloride 0.9 % 100 mL IVPB  Status:  Discontinued     2 g 200 mL/hr over 30 Minutes Intravenous  Once 02/18/18 1620 02/18/18 1640   02/18/18 1630  metroNIDAZOLE (FLAGYL) IVPB 500 mg  Status:  Discontinued     500 mg 100 mL/hr over 60 Minutes Intravenous Every 8 hours 02/18/18 1620 02/19/18 1626   02/18/18 1630  vancomycin (VANCOCIN) IVPB 1000 mg/200 mL premix     1,000 mg 200 mL/hr over 60 Minutes Intravenous  Once 02/18/18 1620 02/18/18 1812      Subjective:   Carlisia Geno seen and examined today.  No complaints this morning. Daughter in law tells me that her appetite is poor. She continues to feel weak.   Objective:   Vitals:   02/21/18 1040 02/21/18 2138 02/21/18 2342 02/22/18 0522  BP: (!) 141/94 (!) 127/101 122/88 (!) 138/98  Pulse: (!) 115 (!) 113  (!) 118  Resp:  (!) 24 20 (!) 23  Temp:  97.9 F (36.6 C)  97.7 F (36.5 C)  TempSrc:  Axillary  Axillary  SpO2:  100%  97%  Weight:    55.2 kg  Height:        Intake/Output Summary (Last 24 hours) at 02/22/2018 1119 Last data filed at 02/22/2018 0522 Gross per 24 hour  Intake 350 ml  Output 300 ml  Net 50 ml   Filed Weights   02/20/18 0543 02/21/18 0504 02/22/18 0522  Weight: 60.5 kg 60.1 kg 55.2 kg   Exam  General: Well developed, frail, cachectic, chronically ill-appearing, NAD  HEENT: NCAT, mucous membranes moist.   Cardiovascular: S1 S2 auscultated, tachycardic, 3/6 SEM  Respiratory: Diminished  Extremities: warm dry without cyanosis clubbing or edema  Neuro: AAOx3, focal  Skin: Several bruises on various body areas  Psych: appropriate  Data Reviewed: I have personally reviewed following labs and imaging studies  CBC: Recent Labs  Lab 02/18/18 1631 02/19/18 0612  WBC  19.8* 17.0*  NEUTROABS 16.7*  --   HGB 8.3* 7.5*  HCT 31.8* 26.5*  MCV 89.1 86.0  PLT 229 94*   Basic Metabolic Panel: Recent Labs  Lab 02/18/18 1631 02/19/18 0612 02/20/18 0421  NA 140 140 140  K 4.8 3.9 3.5  CL 104 106 109  CO2 24 21* 24  GLUCOSE 60* 107* 181*  BUN 23 32* 27*  CREATININE 0.87 1.02* 0.69  CALCIUM 8.5* 7.9* 8.4*   GFR: Estimated Creatinine Clearance: 46.4 mL/min (by C-G formula based on SCr of 0.69 mg/dL). Liver Function Tests: Recent Labs  Lab 02/18/18 1631 02/19/18 0612 02/20/18 0421  AST 949* 5,614* 4,192*  ALT 1,132* 4,550* 5,816*  ALKPHOS 134* 132* 150*  BILITOT 2.1* 2.1* 1.8*  PROT 5.8* 5.3* 5.2*  ALBUMIN 3.0* 2.7* 2.6*   No results for input(s): LIPASE, AMYLASE in the last 168 hours. Recent Labs  Lab 02/20/18 0421  AMMONIA 56*   Coagulation Profile: Recent Labs  Lab 02/18/18 1631 02/20/18 0421  INR 2.22 4.35*   Cardiac Enzymes: No results for input(s): CKTOTAL, CKMB, CKMBINDEX, TROPONINI in the last 168 hours. BNP (last 3 results) No results for input(s): PROBNP in the last 8760 hours. HbA1C: No results  for input(s): HGBA1C in the last 72 hours. CBG: Recent Labs  Lab 02/20/18 1228 02/20/18 1709 02/20/18 2113 02/21/18 0749 02/21/18 1127  GLUCAP 210* 131* 146* 71 173*   Lipid Profile: No results for input(s): CHOL, HDL, LDLCALC, TRIG, CHOLHDL, LDLDIRECT in the last 72 hours. Thyroid Function Tests: No results for input(s): TSH, T4TOTAL, FREET4, T3FREE, THYROIDAB in the last 72 hours. Anemia Panel: Recent Labs    02/20/18 0421  VITAMINB12 6,234*  FOLATE 31.1   Urine analysis:    Component Value Date/Time   COLORURINE YELLOW 02/18/2018 1930   APPEARANCEUR CLEAR 02/18/2018 1930   LABSPEC 1.010 02/18/2018 1930   PHURINE 7.0 02/18/2018 1930   GLUCOSEU NEGATIVE 02/18/2018 1930   HGBUR NEGATIVE 02/18/2018 1930   [place] NEGATIVE 02/18/2018 1930   [place] NEGATIVE 02/18/2018 1930   PROTEINUR NEGATIVE  02/18/2018 1930   NITRITE NEGATIVE 02/18/2018 1930   LEUKOCYTESUR NEGATIVE 02/18/2018 1930   Sepsis Labs: @LABRCNTIP (procalcitonin:4,lacticidven:4)  ) Recent Results (from the past 240 hour(s))  Blood Culture (routine x 2)     Status: None (Preliminary result)   Collection Time: 02/18/18  4:31 PM  Result Value Ref Range Status   Specimen Description BLOOD LEFT ANTECUBITAL  Final   Special Requests   Final    BOTTLES DRAWN AEROBIC AND ANAEROBIC Blood Culture results may not be optimal due to an inadequate volume of blood received in culture bottles   Culture   Final    NO GROWTH 3 DAYS Performed at [facility], [place] [address] [place], [place] [address]    Report Status PENDING  Incomplete  Blood Culture (routine x 2)     Status: None (Preliminary result)   Collection Time: 02/18/18  5:04 PM  Result Value Ref Range Status   Specimen Description BLOOD RIGHT ANTECUBITAL  Final   Special Requests   Final    BOTTLES DRAWN AEROBIC AND ANAEROBIC Blood Culture adequate volume   Culture   Final    NO GROWTH 3 DAYS Performed at [facility], [place] [address] [place],  [address]    Report Status PENDING  Incomplete      Radiology Studies: No results found.   Scheduled Meds: . cephALEXin  500 mg Oral BID  . feeding supplement (ENSURE ENLIVE)  237 mL Oral BID BM   Continuous Infusions: . dextrose 5 % and 0.45% NaCl 50 mL/hr at 02/19/18 1642     LOS: 4 days   Time Spent in minutes   30 minutes  Kristine Tiley D.O. on 02/22/2018 at 11:19 AM  Between 7am to 7pm - Please see pager noted on [email or web address]  After 7pm go to [email or web address]  And look for the night coverage person covering for me after hours  [facility] Office  [phone]

## 2018-02-23 LAB — CULTURE, BLOOD (ROUTINE X 2)
CULTURE: NO GROWTH
CULTURE: NO GROWTH
SPECIAL REQUESTS: ADEQUATE

## 2018-02-23 MED ORDER — METOPROLOL TARTRATE 12.5 MG HALF TABLET
12.5000 mg | ORAL_TABLET | Freq: Two times a day (BID) | ORAL | Status: DC
  Administered 2018-02-23: 12.5 mg via ORAL
  Filled 2018-02-23: qty 1

## 2018-02-23 MED ORDER — METOPROLOL TARTRATE 5 MG/5ML IV SOLN
2.5000 mg | Freq: Four times a day (QID) | INTRAVENOUS | Status: DC | PRN
  Administered 2018-02-23 – 2018-02-24 (×4): 2.5 mg via INTRAVENOUS
  Filled 2018-02-23 (×4): qty 5

## 2018-02-23 NOTE — Progress Notes (Signed)
Hospital and [place]: Visit @1220   Unfortunately [facility] is not able to offer a room today. Family (son-David and daughter-in-law Jocelyn Lamer) and CSW Baxter Flattery) are aware. [facility] liaison will follow up with CSW and Family tomorrow or sooner if room becomes available.   Please do not hesitate to call with questions.  Thank you.   Raina Mina, RN, BSN [facility] Liaison are on AMION

## 2018-02-23 NOTE — Progress Notes (Signed)
CSW received a consult to speak with pt's family members concerning  Crematoriums in the area.  CSW met with pt's son and daughter in law and gave them a list of area crematoriums.  Family members were grateful for the list.  CSW will continue to follow as needed.  Reed Breech LCSWA [phone]

## 2018-02-23 NOTE — Progress Notes (Signed)
Per Son, received call from [facility] and they have a bed available for patient tomorrow, 11/3. States the hospice nurse wants to meet with family at [facility], tomorrow 11/3.

## 2018-02-23 NOTE — Progress Notes (Signed)
PROGRESS NOTE    Angela Hines  YIF:[id number] DOB: December 16, 1932 DOA: 02/18/2018 PCP: Kelton Pillar, MD   Brief Narrative:  HPI On 02/18/2018 Dr. Gala Romney Angela Hines is a 84 y.o. female with medical history significant of recent hospitalization with streptococcal bacteremia, UTI, arrhythmias, CHF, aortic stenosis, GERD, atrial fibrillation, COPD who was just discharged from the hospital and is now a DNR.  She was brought in from her facility by her son with acute cardiorespiratory failure sepsis type syndrome with no obvious source of sepsis.  Patient was profoundly hypotensive altered mental status was elected a more than 5.  She was immediately given IV fluids to which she responded.  She was hypoglycemic and overall in respiratory failure requiring BiPAP.  During last hospitalization hospice was discussed.  Son is with the patient and not sure if he wanted patient treated or just hospice care.  He asked for patient's treatment at this point however they want to transition to hospice home from tomorrow.  Patient is therefore being admitted on BiPAP.  Interim history  Admitted for respiratory failure.  Found to have moderate to severe dehydration as well as hypotension and acute metabolic encephalopathy which have resolved.  Also noted to have transaminitis with coagulopathy and shock liver.  Palliative care consulted and appreciated, planning for more comfort measures. Pending residential hospice placement.  Assessment & Plan   Moderate to severe dehydration -Improved  Hypotension -Resolved -Secondary to dehydration  Acute metabolic encephalopathy -Resolved, patient appears to be alert and oriented  Moderate severe protein calorie malnutrition/failure to thrive -Presented with dehydration due to poor oral intake  Transaminitis with coagulopathy/shock liver/elevated ammonia and hepatic encephalopathy -No signs of bleeding -?Drug induced -Hepatitis panel  negative -Xarelto discontinued -Continue lactulose -Blood cultures show no growth  Atrial fibrillation with RVR -patient with RVR today -not sure why metoprolol was discontinued, have reordered metoprolol 12.5mg  PO BID and IV PRN to control HR as this also provides comfort -Amiodarone discontinued due to liver issues  COPD -Stable, continue bronchodilators as necessary  Chronic systolic and diastolic heart failure -Echocardiogram 02/03/2018 showed EF of 45% -Monitor intake and output, daily weights  Valvular disease -Status post bioprosthetic AV valve replacement, mitral regurg, tricuspid regurg  Hypoglycemia -Due to poor oral intake -Continue D5 half-normal saline -Encouraged oral diet  Recent streptococcal bacterial infection -Patient to continue Keflex indefinitely twice daily  Goals of care/CODE STATUS -She confirms that she does not want heroic measures.  Currently DNR -Patient understands that she has poor prognosis -Palliative care consulted and appreciated, hopeless and transition to residential hospice for end-of-life care -Social work consulted for [facility] place placement- pending bed availability.   DVT Prophylaxis  SCDs  Code Status: DNR  Family Communication: Family at bedside  Disposition Plan: Admitted. Pending Hospice placement  Consultants Palliative care  Procedures  None  Antibiotics   Anti-infectives (From admission, onward)   Start     Dose/Rate Route Frequency Ordered Stop   02/20/18 1400  cephALEXin (KEFLEX) capsule 500 mg    Note to Pharmacy:  OP SIG:No stop date Patient taking differently: No stop date (for streptococcal bacteremia)     500 mg Oral 2 times daily 02/20/18 1321     02/19/18 1700  vancomycin (VANCOCIN) IVPB 1000 mg/200 mL premix  Status:  Discontinued     1,000 mg 200 mL/hr over 60 Minutes Intravenous Every 24 hours 02/18/18 1810 02/19/18 1625   02/19/18 1700  ceFEPIme (MAXIPIME) 1 g in sodium chloride 0.9 % 100  mL IVPB   Status:  Discontinued     1 g 200 mL/hr over 30 Minutes Intravenous Every 24 hours 02/18/18 1810 02/19/18 1625   02/18/18 1645  ceFEPIme (MAXIPIME) 2 g in sodium chloride 0.9 % 100 mL IVPB     2 g 200 mL/hr over 30 Minutes Intravenous  Once 02/18/18 1640 02/18/18 1801   02/18/18 1630  aztreonam (AZACTAM) 2 g in sodium chloride 0.9 % 100 mL IVPB  Status:  Discontinued     2 g 200 mL/hr over 30 Minutes Intravenous  Once 02/18/18 1620 02/18/18 1640   02/18/18 1630  metroNIDAZOLE (FLAGYL) IVPB 500 mg  Status:  Discontinued     500 mg 100 mL/hr over 60 Minutes Intravenous Every 8 hours 02/18/18 1620 02/19/18 1626   02/18/18 1630  vancomycin (VANCOCIN) IVPB 1000 mg/200 mL premix     1,000 mg 200 mL/hr over 60 Minutes Intravenous  Once 02/18/18 1620 02/18/18 1812      Subjective:   Angela Hines seen and examined today.  Patient has no complaints this morning.  Denies current chest pain, shortness of breath, abdominal pain, nausea or vomiting, diarrhea or constipation, dizziness or headache.  Objective:   Vitals:   02/21/18 2138 02/21/18 2342 02/22/18 0522 02/22/18 1321  BP: (!) 127/101 122/88 (!) 138/98 115/86  Pulse: (!) 113  (!) 118   Resp: (!) 24 20 (!) 23   Temp: 97.9 F (36.6 C)  97.7 F (36.5 C) (!) 97.5 F (36.4 C)  TempSrc: Axillary  Axillary Axillary  SpO2: 100%  97% 100%  Weight:   55.2 kg   Height:        Intake/Output Summary (Last 24 hours) at 02/23/2018 1120 Last data filed at 02/22/2018 2359 Gross per 24 hour  Intake 300 ml  Output -  Net 300 ml   Filed Weights   02/20/18 0543 02/21/18 0504 02/22/18 0522  Weight: 60.5 kg 60.1 kg 55.2 kg   Exam  General: Well developed, frail, cachectic, chronically ill-appearing, NAD  HEENT: NCAT, mucous membranes moist.   Neck: Supple  Cardiovascular: S1 S2 auscultated, irregularly irregular, 3/6 SEM  Respiratory: Diminished breath sounds, crackles  Abdomen: Soft, nontender, nondistended, + bowel  sounds  Extremities: warm dry without cyanosis clubbing or edema  Neuro: AAOx3, nonfocal, weak  Psych: appropriate mood and affect  Data Reviewed: I have personally reviewed following labs and imaging studies  CBC: Recent Labs  Lab 02/18/18 1631 02/19/18 0612  WBC 19.8* 17.0*  NEUTROABS 16.7*  --   HGB 8.3* 7.5*  HCT 31.8* 26.5*  MCV 89.1 86.0  PLT 229 94*   Basic Metabolic Panel: Recent Labs  Lab 02/18/18 1631 02/19/18 0612 02/20/18 0421  NA 140 140 140  K 4.8 3.9 3.5  CL 104 106 109  CO2 24 21* 24  GLUCOSE 60* 107* 181*  BUN 23 32* 27*  CREATININE 0.87 1.02* 0.69  CALCIUM 8.5* 7.9* 8.4*   GFR: Estimated Creatinine Clearance: 46.4 mL/min (by C-G formula based on SCr of 0.69 mg/dL). Liver Function Tests: Recent Labs  Lab 02/18/18 1631 02/19/18 0612 02/20/18 0421  AST 949* 5,614* 4,192*  ALT 1,132* 4,550* 5,816*  ALKPHOS 134* 132* 150*  BILITOT 2.1* 2.1* 1.8*  PROT 5.8* 5.3* 5.2*  ALBUMIN 3.0* 2.7* 2.6*   No results for input(s): LIPASE, AMYLASE in the last 168 hours. Recent Labs  Lab 02/20/18 0421  AMMONIA 56*   Coagulation Profile: Recent Labs  Lab 02/18/18 1631 02/20/18 0421  INR  2.22 4.35*   Cardiac Enzymes: No results for input(s): CKTOTAL, CKMB, CKMBINDEX, TROPONINI in the last 168 hours. BNP (last 3 results) No results for input(s): PROBNP in the last 8760 hours. HbA1C: No results for input(s): HGBA1C in the last 72 hours. CBG: Recent Labs  Lab 02/20/18 1228 02/20/18 1709 02/20/18 2113 02/21/18 0749 02/21/18 1127  GLUCAP 210* 131* 146* 71 173*   Lipid Profile: No results for input(s): CHOL, HDL, LDLCALC, TRIG, CHOLHDL, LDLDIRECT in the last 72 hours. Thyroid Function Tests: No results for input(s): TSH, T4TOTAL, FREET4, T3FREE, THYROIDAB in the last 72 hours. Anemia Panel: No results for input(s): VITAMINB12, FOLATE, FERRITIN, TIBC, IRON, RETICCTPCT in the last 72 hours. Urine analysis:    Component Value Date/Time    COLORURINE YELLOW 02/18/2018 1930   APPEARANCEUR CLEAR 02/18/2018 1930   LABSPEC 1.010 02/18/2018 1930   PHURINE 7.0 02/18/2018 1930   GLUCOSEU NEGATIVE 02/18/2018 1930   HGBUR NEGATIVE 02/18/2018 1930   [place] NEGATIVE 02/18/2018 1930   [place] NEGATIVE 02/18/2018 1930   PROTEINUR NEGATIVE 02/18/2018 1930   NITRITE NEGATIVE 02/18/2018 1930   LEUKOCYTESUR NEGATIVE 02/18/2018 1930   Sepsis Labs: @LABRCNTIP (procalcitonin:4,lacticidven:4)  ) Recent Results (from the past 240 hour(s))  Blood Culture (routine x 2)     Status: None   Collection Time: 02/18/18  4:31 PM  Result Value Ref Range Status   Specimen Description BLOOD LEFT ANTECUBITAL  Final   Special Requests   Final    BOTTLES DRAWN AEROBIC AND ANAEROBIC Blood Culture results may not be optimal due to an inadequate volume of blood received in culture bottles   Culture   Final    NO GROWTH 5 DAYS Performed at [facility], [place] [address], [place], [place] [address]    Report Status 02/23/2018 FINAL  Final  Blood Culture (routine x 2)     Status: None   Collection Time: 02/18/18  5:04 PM  Result Value Ref Range Status   Specimen Description BLOOD RIGHT ANTECUBITAL  Final   Special Requests   Final    BOTTLES DRAWN AEROBIC AND ANAEROBIC Blood Culture adequate volume   Culture   Final    NO GROWTH 5 DAYS Performed at [facility], [place] [address] [place], [place] [address]    Report Status 02/23/2018 FINAL  Final      Radiology Studies: No results found.   Scheduled Meds: . cephALEXin  500 mg Oral BID  . feeding supplement (ENSURE ENLIVE)  237 mL Oral BID BM   Continuous Infusions: . dextrose 5 % and 0.45% NaCl 50 mL/hr at 02/19/18 1642     LOS: 5 days   Time Spent in minutes   30 minutes  Angela Hines D.O. on 02/23/2018 at 11:20 AM  Between 7am to 7pm - Please see pager noted on [email or web address]  After 7pm go to [email or web address]  And look for the night coverage person covering for me after  hours  [facility] Office  [phone]

## 2018-02-23 NOTE — Plan of Care (Signed)
  Problem: Coping: Goal: Level of anxiety will decrease Outcome: Progressing Note:  No s/s of anxiety noted.   Problem: Elimination: Goal: Will not experience complications related to urinary retention Outcome: Progressing Note:  No s/s of urinary retention noted.   

## 2018-02-23 NOTE — Progress Notes (Signed)
[facility] [place]: Incoming call [phone]   RN received a call from [facility] with a room to offer for an AM admission (Sunday 11/3). RN notified CSW Baxter Flattery) and family. Spoke with son Shanon Brow) and made an appointment for [facility] paperwork at 9:00am 11/3 at the pt's bedside.  Please do not hesitate to contact with any inquires or questions.  Thank You.  Raina Mina, RN, BSN [facility] Liaison [employer] are on [place].

## 2018-02-24 MED ORDER — ENSURE ENLIVE PO LIQD: 237.0000 mL | Freq: Three times a day (TID) | ORAL | 12 refills | Status: AC

## 2018-02-24 MED ORDER — METOPROLOL TARTRATE 25 MG PO TABS
25.0000 mg | ORAL_TABLET | Freq: Two times a day (BID) | ORAL | Status: DC
  Filled 2018-02-24: qty 1

## 2018-02-24 NOTE — Progress Notes (Signed)
Hospice and Palliative Care of : Visit 0900  Met with (sons-George/David) and other family members to confirm interest and explained services. Family agreeable to transfer today. CSW aware. Registration paper work completed at [facility] today. Dr. Orpah Melter to assume care per family request. Please fax discharge summary [phone]. RN please call report to [phone]. Please arrange transport for patient to arrive as soon as possible.   Thank you.  Vicente Serene, BSN [facility] Liaison [facility] Liaison are on AMION

## 2018-02-24 NOTE — Plan of Care (Signed)
  Problem: Clinical Measurements: Goal: Respiratory complications will improve Outcome: Progressing Note:  No s/s of respiratory compliations.  Stable on O2 @ 2L/[place]. Goal: Cardiovascular complication will be avoided Outcome: Progressing Note:  No s/s of cardiovascular complication.

## 2018-02-24 NOTE — Progress Notes (Signed)
Patient will Discharge To: [facility] Anticipated DC Date:02/24/18 Family Notified: yes, Family spoke with Raina Mina [facility] Liaison) Transport By: Corey Harold   Per MD patient ready for DC to [facility] . RN, patient, patient's family, and facility notified of DC., and Discharge Summary sent to facility. RN given number for report [phone]). DC packet on chart. Ambulance transport requested for patient.   CSW signing off.  Reed Breech LCSWA [phone]

## 2018-02-24 NOTE — Progress Notes (Signed)
Report called to [place] at [facility]. Updated family and Patient on d/c process to [facility].  Will give Morphine IV per family request when transport arrives for comfort during transfer.

## 2018-02-24 NOTE — Discharge Summary (Signed)
Physician Discharge Summary  Angela Hines PNT:[id number] DOB: 08-Nov-1932 DOA: 02/18/2018  PCP: Kelton Pillar, MD  Admit date: 02/18/2018 Discharge date: 02/24/2018  Time spent: 45 minutes  Recommendations for Outpatient Follow-up:  Patient will be discharged to [facility]. Continue comfort feeds.  Discharge Diagnoses:  Moderate to severe dehydration Hypotension Acute metabolic encephalopathy Moderate severe protein calorie malnutrition/failure to thrive Transaminitis with coagulopathy/shock liver/elevated ammonia and hepatic encephalopathy Atrial fibrillation with RVR COPD Chronic systolic and diastolic heart failure Valvular disease Hypoglycemia Recent streptococcal bacterial infection Goals of care/CODE STATUS  Discharge Condition: Stable  Diet recommendation: comfort  Filed Weights   02/21/18 0504 02/22/18 0522 02/24/18 0455  Weight: 60.1 kg 55.2 kg 54.7 kg    History of present illness:  On 02/18/2018 Dr. Madelyn Brunner a 84 y.o.femalewith medical history significant ofrecent hospitalization with streptococcal bacteremia,UTI,arrhythmias,CHF, aortic stenosis, GERD, atrial fibrillation, COPD who was just discharged from the hospital and is now a DNR. She was brought in from her facility by her son with acute cardiorespiratory failure sepsis type syndrome with no obvious source of sepsis.Patient was profoundly hypotensive altered mental status was elected a more than 5. She was immediately given IV fluids to which she responded. She was hypoglycemic and overall in respiratory failure requiring BiPAP. During last hospitalization hospice was discussed. Son is with the patient and not sure if he wanted patient treated or just hospice care. He asked for patient's treatment at this point however they want to transition to hospice home from tomorrow. Patient is therefore being admitted on BiPAP.  Hospital Course:  Moderate to severe  dehydration -Improved  Hypotension -Resolved -Secondary to dehydration  Acute metabolic encephalopathy -Resolved, patient appears to be alert and oriented  Moderate severe protein calorie malnutrition/failure to thrive -Presented with dehydration due to poor oral intake  Transaminitis with coagulopathy/shock liver/elevated ammonia and hepatic encephalopathy -No signs of bleeding -?Drug induced -Hepatitis panel negative -Xarelto discontinued -Continue lactulose -Blood cultures show no growth  Atrial fibrillation with RVR -patient with RVR today -not sure why metoprolol was discontinued, have reordered metoprolol 12.5mg  PO BID and IV PRN to control HR as this also provides comfort -Amiodarone discontinued due to liver issues  COPD -Stable, continue bronchodilators as necessary  Chronic systolic and diastolic heart failure -Echocardiogram 02/03/2018 showed EF of 45% -Monitor intake and output, daily weights  Valvular disease -Status post bioprosthetic AV valve replacement, mitral regurg, tricuspid regurg  Hypoglycemia -Due to poor oral intake -Continue D5 half-normal saline -Encouraged oral diet  Recent streptococcal bacterial infection -Patient to continue Keflex indefinitely twice daily  Goals of care/CODE STATUS -She confirms that she does not want heroic measures.  Currently DNR -Patient understands that she has poor prognosis -Palliative care consulted and appreciated, hopeless and transition to residential hospice for end-of-life care -Social work consulted for [facility] placement- pending bed availability.   Code status: DNR  Consultants Palliative care  Procedures  None  Discharge Exam: Vitals:   02/24/18 0455 02/24/18 0918  BP: (!) 136/98 (!) 125/98  Pulse: (!) 133   Resp:  20  Temp: (!) 97.5 F (36.4 C)   SpO2: 100%      General: Well developed, cachetic, chronically ill appearing  HEENT: NCAT, PERRLA, EOMI, Anicteic  Sclera, mucous membranes moist.  Cardiovascular: S1 S2 auscultated, tachycardic, irregular, +SEM  Neuro: AAOx3, nonfocal, weak  Psych: Appropriate  Discharge Instructions  Allergies as of 02/24/2018      Reactions   Clonidine Swelling, Other (See Comments)   "  head swelling"   Captopril Swelling   Codeine Nausea And Vomiting, Other (See Comments)   GI Intolerance   Ampicillin Rash   Ceclor [cefaclor] Rash      Medication List    STOP taking these medications   amiodarone 100 MG tablet Commonly known as:  PACERONE   buPROPion 300 MG 24 hr tablet Commonly known as:  WELLBUTRIN XL   furosemide 20 MG tablet Commonly known as:  LASIX   gabapentin 600 MG tablet Commonly known as:  NEURONTIN   LORazepam 0.5 MG tablet Commonly known as:  ATIVAN   magnesium oxide 400 (241.3 Mg) MG tablet Commonly known as:  MAG-OX   metFORMIN 1000 MG tablet Commonly known as:  GLUCOPHAGE   omeprazole 40 MG capsule Commonly known as:  PRILOSEC   polyethylene glycol packet Commonly known as:  MIRALAX / GLYCOLAX   predniSONE 5 MG tablet Commonly known as:  DELTASONE   Rivaroxaban 15 MG Tabs tablet Commonly known as:  XARELTO     TAKE these medications   albuterol 108 (90 Base) MCG/ACT inhaler Commonly known as:  PROVENTIL HFA;VENTOLIN HFA Inhale 2 puffs into the lungs every 6 (six) hours as needed for shortness of breath.   cephALEXin 500 MG capsule Commonly known as:  KEFLEX Take 1 capsule (500 mg total) by mouth 2 (two) times daily. No stop date What changed:  additional instructions   metoprolol tartrate 25 MG tablet Commonly known as:  LOPRESSOR Take 1 tablet (25 mg total) by mouth 2 (two) times daily.   NUTRITIONAL SUPPLEMENT PO Take 120 mLs by mouth 3 (three) times daily. MedPass 2.0   feeding supplement (ENSURE ENLIVE) Liqd Take 237 mLs by mouth 3 (three) times daily between meals.      Allergies  Allergen Reactions  . Clonidine Swelling and Other (See  Comments)    "head swelling"  . Captopril Swelling  . Codeine Nausea And Vomiting and Other (See Comments)    GI Intolerance   . Ampicillin Rash  . Ceclor [Cefaclor] Rash      The results of significant diagnostics from this hospitalization (including imaging, microbiology, ancillary and laboratory) are listed below for reference.    Significant Diagnostic Studies: Dg Chest Port 1 View  Result Date: 02/18/2018 CLINICAL DATA:  Sepsis. EXAM: PORTABLE CHEST 1 VIEW COMPARISON:  02/01/2018 and prior radiograph FINDINGS: Cardiomegaly, cardiac valve replacement and loop recorder again noted. Mild peribronchial thickening is unchanged There is no evidence of focal airspace disease, pulmonary edema, suspicious pulmonary nodule/mass, pleural effusion, or pneumothorax. No acute bony abnormalities are identified. IMPRESSION: Cardiomegaly without evidence of acute cardiopulmonary disease Electronically Signed   By: Margarette Canada M.D.   On: 02/18/2018 17:29   Dg Chest Port 1 View  Result Date: 01/31/2018 CLINICAL DATA:  Shortness of breath tonight. EXAM: PORTABLE CHEST 1 VIEW COMPARISON:  12/17/2017 FINDINGS: Postoperative changes in the mediastinum. Cardiac valve prosthesis. Mild cardiac enlargement. No pulmonary vascular congestion. Emphysematous changes are suggested in the lungs with perihilar streaky opacities and interstitial infiltrates in the lung bases suggesting chronic bronchitis and fibrosis. No focal consolidation. No blunting of costophrenic angles. No pneumothorax. Mediastinal contours appear intact. IMPRESSION: Emphysematous and chronic bronchitic changes in the lungs. No focal consolidation. Electronically Signed   By: Lucienne Capers M.D.   On: 01/31/2018 06:19   Dg Chest Port 1v Same Day  Result Date: 02/01/2018 CLINICAL DATA:  SOB Hx of atrial fib, CHF, COPD, HTN, DM2 EXAM: PORTABLE CHEST 1 VIEW COMPARISON:  01/31/2018 FINDINGS: Status post median sternotomy. Valve replacement.  Patient has a loop recorder overlying the central chest. Lungs are mildly hyperinflated. There is prominence of interstitial markings, stable over multiple prior studies. Focal opacities are identified in the RIGHT UPPER lobe, LEFT LOWER lobe, RIGHT LOWER lobe, raising the question of acute superimposed infectious process. No pulmonary edema. IMPRESSION: Cardiomegaly and interstitial lung disease. Focal opacities in the RIGHT UPPER lobe and bilateral LOWER lobes may indicate acute infectious process or asymmetric pulmonary edema. Electronically Signed   By: Nolon Nations M.D.   On: 02/01/2018 09:15   [place] Ekg Site Rite  Result Date: 02/04/2018 If Site Rite image not attached, placement could not be confirmed due to current cardiac rhythm.  [place] Ekg Site Rite  Result Date: 02/03/2018 If Site Rite image not attached, placement could not be confirmed due to current cardiac rhythm.   Microbiology: Recent Results (from the past 240 hour(s))  Blood Culture (routine x 2)     Status: None   Collection Time: 02/18/18  4:31 PM  Result Value Ref Range Status   Specimen Description BLOOD LEFT ANTECUBITAL  Final   Special Requests   Final    BOTTLES DRAWN AEROBIC AND ANAEROBIC Blood Culture results may not be optimal due to an inadequate volume of blood received in culture bottles   Culture   Final    NO GROWTH 5 DAYS Performed at [facility], [place] [address], [place], [place] [address]    Report Status 02/23/2018 FINAL  Final  Blood Culture (routine x 2)     Status: None   Collection Time: 02/18/18  5:04 PM  Result Value Ref Range Status   Specimen Description BLOOD RIGHT ANTECUBITAL  Final   Special Requests   Final    BOTTLES DRAWN AEROBIC AND ANAEROBIC Blood Culture adequate volume   Culture   Final    NO GROWTH 5 DAYS Performed at [facility], [place] [address], [place], [place] [address]    Report Status 02/23/2018 FINAL  Final     Labs: Basic Metabolic Panel: Recent Labs    Lab 02/18/18 1631 02/19/18 0612 02/20/18 0421  NA 140 140 140  K 4.8 3.9 3.5  CL 104 106 109  CO2 24 21* 24  GLUCOSE 60* 107* 181*  BUN 23 32* 27*  CREATININE 0.87 1.02* 0.69  CALCIUM 8.5* 7.9* 8.4*   Liver Function Tests: Recent Labs  Lab 02/18/18 1631 02/19/18 0612 02/20/18 0421  AST 949* 5,614* 4,192*  ALT 1,132* 4,550* 5,816*  ALKPHOS 134* 132* 150*  BILITOT 2.1* 2.1* 1.8*  PROT 5.8* 5.3* 5.2*  ALBUMIN 3.0* 2.7* 2.6*   No results for input(s): LIPASE, AMYLASE in the last 168 hours. Recent Labs  Lab 02/20/18 0421  AMMONIA 56*   CBC: Recent Labs  Lab 02/18/18 1631 02/19/18 0612  WBC 19.8* 17.0*  NEUTROABS 16.7*  --   HGB 8.3* 7.5*  HCT 31.8* 26.5*  MCV 89.1 86.0  PLT 229 94*   Cardiac Enzymes: No results for input(s): CKTOTAL, CKMB, CKMBINDEX, TROPONINI in the last 168 hours. BNP: BNP (last 3 results) Recent Labs    06/09/17 1855 12/17/17 2330 01/31/18 0559  BNP 626.8* 425.4* 1,377.6*    ProBNP (last 3 results) No results for input(s): PROBNP in the last 8760 hours.  CBG: Recent Labs  Lab 02/20/18 1228 02/20/18 1709 02/20/18 2113 02/21/18 0749 02/21/18 1127  GLUCAP 210* 131* 146* 71 173*  Signed:  Cristal Ford  [facility] 02/24/2018, 10:45 AM

## 2018-02-24 NOTE — Progress Notes (Signed)
Family at bedside; states patient having trouble swallowing grape juice, lethargic at this time but responds to questions.  PO meds held; Dr. Ree Kida notified.  IV lopressor given for HR 130.

## 2018-02-25 ENCOUNTER — Ambulatory Visit (INDEPENDENT_AMBULATORY_CARE_PROVIDER_SITE_OTHER): Payer: Medicare Other | Admitting: *Deleted

## 2018-02-25 DIAGNOSIS — I480 Paroxysmal atrial fibrillation: Secondary | ICD-10-CM

## 2018-02-25 DIAGNOSIS — Z7189 Other specified counseling: Secondary | ICD-10-CM

## 2018-02-27 NOTE — Progress Notes (Signed)
Carelink Summary Report / Loop Recorder 

## 2018-03-04 ENCOUNTER — Telehealth: Payer: Self-pay | Admitting: Cardiology

## 2018-03-04 NOTE — Telephone Encounter (Signed)
LMOVM requesting that pt send manual transmission b/c home monitor has not updated in at least 14 days.    

## 2018-03-10 NOTE — Progress Notes (Deleted)
Cardiology Office Note:    Date:  03/10/2018   ID:  Angela Hines, DOB 09-Sep-1932, MRN [id number]  PCP:  Kelton Pillar, MD  Cardiologist:  Jenkins Rouge, MD  Electrophysiologist: Dr. Rayann Heman  Referring MD: Kelton Pillar, MD   No chief complaint on file.   History of Present Illness:    Angela Hines is a 84 y.o. female with a past medical history significant for paroxysmal atrial fibrillation on Sotalol and Xarelto, CHF, bioprosthetic AVR 2005, COPD, diabetes type 2, hypertension and hyperlipidemia. Angela Hines relocated from the [place] area and establish care with me in 01/2017.  She has a history of PAF with TEE/DCC April 2016 with loop recorder placed for syncope, replaced in 08/2017. PAF is treated with sotalol and coumadin. She had a 21 mm Edwards Pericardial 2700 series bioprosthetic AVR by Dr Evelina Dun in September 2005.  09/08/17 after a fall which resulted in significant ecchymosis of the face (her daughter in law has a picture on her phone which is quite striking). CT of the head and neck showed no acute abnormality.  She lives at [facility] in independent care, gets PT and medications given. Her family repots that she gets mildly confused at times. Chronic UE tremor She appears to have been taken Off anticoagulation due to falls and is no longer on amiodarone Dr Rayann Heman increased her lopressor for some high afib rates noted on  LINQ although patient had no symptoms with this   ***  Past Medical History:  Diagnosis Date  . Anginal pain ([place]) ~ 2007   "mini stroke affected her right eye; made it droop; fully recovered" (01/31/2018)  . Anxiety   . Aortic stenosis   . Arthritis    "joints" (01/31/2018)  . Atrial fibrillation ([place])   . CHF (congestive heart failure) ([place])   . Chronic bronchitis ([place])   . COPD (chronic obstructive pulmonary disease) ([place])   . Depressed affect   . Depression   . GERD (gastroesophageal reflux disease)   . Headache    "monthly"  (01/31/2018)  . High cholesterol   . Hypertension   . Migraine    "none since <1990" (01/31/2018)  . Paroxysmal atrial fibrillation ([facility])   . Pneumonia 2019  . Type II diabetes mellitus ([place])    "recently dc'd Metformin" (01/31/2018)    Past Surgical History:  Procedure Laterality Date  . CARDIAC PACEMAKER PLACEMENT  07/30/2014  . 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 CARDIOVERSION N/A 06/15/2017   Procedure: CARDIOVERSION;  Surgeon: Lelon Perla, MD;  Location: [facility] ENDOSCOPY;  Service: Cardiovascular;  Laterality: N/A;  . CATARACT EXTRACTION, BILATERAL Bilateral   . EXCISIONAL HEMORRHOIDECTOMY    . LAPAROSCOPIC CHOLECYSTECTOMY    . LOOP RECORDER INSERTION N/A 08/22/2017   Procedure: LOOP RECORDER INSERTION;  Surgeon: [name], MD;  Location: [place] CV LAB;  Service: Cardiovascular;  Laterality: N/A;  . LOOP RECORDER REMOVAL N/A 08/22/2017   Procedure: LOOP RECORDER REMOVAL;  Surgeon: [name], MD;  Location: [place] [facility];  Service: Cardiovascular;  Laterality: N/A;    Current Medications: No outpatient medications have been marked as taking for the 03/14/18 encounter (Appointment) with Josue Hector, MD.     Allergies:   Clonidine; Captopril; Codeine; Ampicillin; and Ceclor [cefaclor]   Social History   Socioeconomic History  . Marital status: Widowed    Spouse name: Not on file  . Number of children: Not on file  .  Years of education: Not on file  . Highest education level: Not on file  Occupational History  . Not on file  Social Needs  . Financial resource strain: Not on file  . Food insecurity:    Worry: Not on file    Inability: Not on file  . Transportation needs:    Medical: Not on file    Non-medical: Not on file  Tobacco Use  . Smoking status: Never Smoker  . Smokeless tobacco: Never Used  Substance and Sexual Activity  . Alcohol use: Not Currently    Frequency: Never    Comment: 01/31/2018 "did drink ~  1 glass of wine/wk; nothing in the last 6 months or more"  . Drug use: Never  . Sexual activity: Never  Lifestyle  . Physical activity:    Days per week: Not on file    Minutes per session: Not on file  . Stress: Not on file  Relationships  . Social connections:    Talks on phone: Not on file    Gets together: Not on file    Attends religious service: Not on file    Active member of club or organization: Not on file    Attends meetings of clubs or organizations: Not on file    Relationship status: Not on file  Other Topics Concern  . Not on file  Social History Narrative  . Not on file     Family History: The patient's family history includes Diabetes in her son; Emphysema in her father; Heart attack in her son; Heart disease in her son; Hypertension in her brother, brother, and sister; Lung disease in her sister; Multiple myeloma in her son; Other in her son; Stroke in her brother. ROS:   Please see the history of present illness.     All other systems reviewed and are negative.  EKGs/Labs/Other Studies Reviewed:    The following studies were reviewed today:  Echocardiogram 06/11/2017 Study Conclusions  - Left ventricle: The cavity size was normal. There was moderate   concentric hypertrophy. Systolic function was mildly to   moderately reduced. The estimated ejection fraction was in the   range of 40% to 45%. Diffuse hypokinesis. - Aortic valve: A bioprosthetic valve is present in the aortic   position. There was moderate stenosis. Valve area (VTI): 0.31   cm^2. Valve area (Vmax): 0.39 cm^2. Valve area (Vmean): 0.39   cm^2. - Mitral valve: There was moderate regurgitation directed   posteriorly. - Left atrium: The atrium was moderately dilated. - Right ventricle: Systolic function was moderately reduced. - Tricuspid valve: There was moderate regurgitation. - Pulmonary arteries: Systolic pressure was mildly increased. PA   peak pressure: 33 mm Hg (S). - Inferior  vena cava: The vessel was dilated. The respirophasic   diameter changes were blunted (< 50%), consistent with elevated   central venous pressure.  Impressions: - No prior echocardiogram is available for comparison.   LVEF is mildly to moderately decrased, LVEF 40-45%.   RVEF is moderately decreased.   Velocities and gradients across the bioprosthetic aortic valve   are elevated (2.9 m/sec, mean gradient 21 mmHg).   Moderate mitral and tricuspid regurgitation.   EKG:    02/18/18 afib rate 91 LVH   Recent Labs: 05/22/2017: TSH 0.659 01/31/2018: B Natriuretic Peptide 1,377.6 02/03/2018: Magnesium 2.2 02/19/2018: Hemoglobin 7.5; Platelets 94 02/20/2018: ALT 5,816; BUN 27; Creatinine, Ser 0.69; Potassium 3.5; Sodium 140   Recent Lipid Panel No results found for: CHOL, TRIG, HDL,  CHOLHDL, VLDL, LDLCALC, LDLDIRECT  Physical Exam:    VS:  There were no vitals taken for this visit.    Wt Readings from Last 3 Encounters:  02/24/18 120 lb 11.2 oz (54.7 kg)  02/15/18 116 lb (52.6 kg)  01/31/18 108 lb 0.4 oz (49 kg)     Affect appropriate Thin frail female  HEENT: normal Neck supple with no adenopathy JVP normal no bruits no thyromegaly Lungs clear with no wheezing and good diaphragmatic motion Heart:  S1/S2 SEM through AVR  murmur, no rub, gallop or click PMI normal Abdomen: benighn, BS positve, no tenderness, no AAA no bruit.  No HSM or HJR Distal pulses intact with no bruits No edema Neuro non-focal Skin warm and dry No muscular weakness   ASSESSMENT:    No diagnosis found. PLAN:    In order of problems listed above:  Hypertension: improved with norvasc Allergic to ACE ***  AVR: Bioprosthetic aortic valve replacement in 2005.  Predictably starting to fail with calcified leaflets and mean gradient across valve 29 mmHg peak 48 mmHg Given co morbidies do not think valve in valve TAVR should be pursued   Paroxysmal atrial fibrillation: Seems as though Dr Rayann Heman has  indicated rate control for her PAF. No longer on anticoagulation Due to multiple falls Amiodarone d/c Beta blocker increased by him October 2019  Last PACEART 01/22/18 indicated Sinus Rhythm   Chronic systolic heart failure: EF 45-50%  10/13//2019.  On beta-blocker and Lasix.  No ACE-I dt allergy. Lasix dose lowered Due to poor PO intake and history of dehydration   Medication Adjustments/Labs and Tests Ordered: Current medicines are reviewed at length with the patient today.  Concerns regarding medicines are outlined above. Labs and tests ordered and medication changes are outlined in the patient instructions below:  There are no Patient Instructions on file for this visit.   Signed, Jenkins Rouge, MD  03/10/2018 3:27 PM    [place]

## 2018-03-14 ENCOUNTER — Encounter

## 2018-03-14 ENCOUNTER — Ambulatory Visit: Payer: Medicare Other | Admitting: Cardiovascular Disease

## 2018-03-18 ENCOUNTER — Encounter: Payer: Self-pay | Admitting: Cardiology

## 2018-03-24 DEATH — deceased

## 2018-04-03 ENCOUNTER — Encounter: Payer: Self-pay | Admitting: Cardiology

## 2018-04-09 ENCOUNTER — Encounter: Payer: Self-pay | Admitting: Cardiology

## 2018-04-20 LAB — CUP PACEART REMOTE DEVICE CHECK
Implantable Pulse Generator Implant Date: 20190501
MDC IDC SESS DTM: 20191104111202

## 2020-05-06 IMAGING — CT CT CERVICAL SPINE W/O CM
4 of 8 series · 13 of 33 positions shown, 14 images · non-contrast
Comparison: CT head 09/10/2017. CT head and cervical spine
09/08/2017

CLINICAL DATA: Mechanical fall while getting out of bed. Struck
posterior head. Patient is on blood thinners. No loss of
consciousness.

EXAM:
CT HEAD WITHOUT CONTRAST
CT CERVICAL SPINE WITHOUT CONTRAST
TECHNIQUE: Multidetector CT imaging of the head and cervical spine was
performed following the standard protocol without intravenous
contrast. Multiplanar CT image reconstructions of the cervical spine
were also generated.

[Series 5: cor soft · coronal · 0.32mm/px · 3 of 67 slices shown]
[im 17/67  bone]
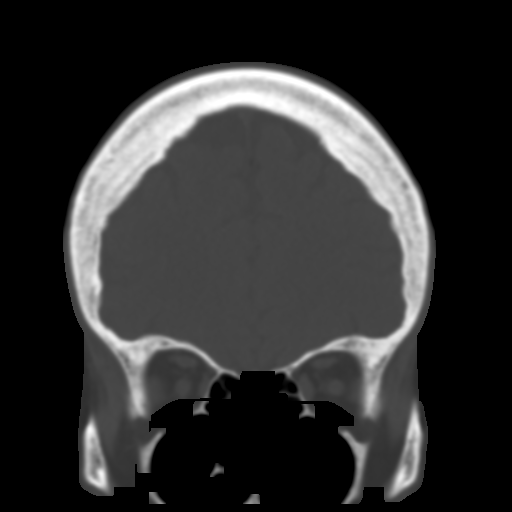
[im 34/67  bone]
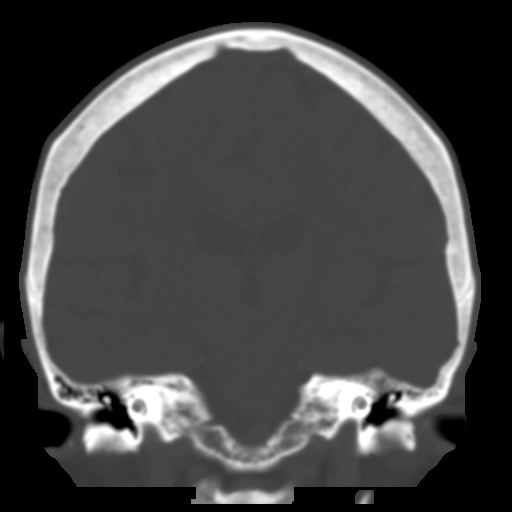
[im 50/67  bone]
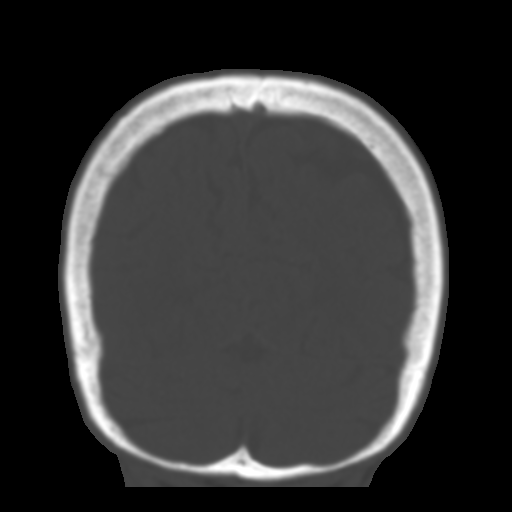

[Series 8: c spine soft · axial · 0.32mm/px · z∈[-245,-185]mm · 2 of 90 slices shown]
[im 30/90  soft-tissue]
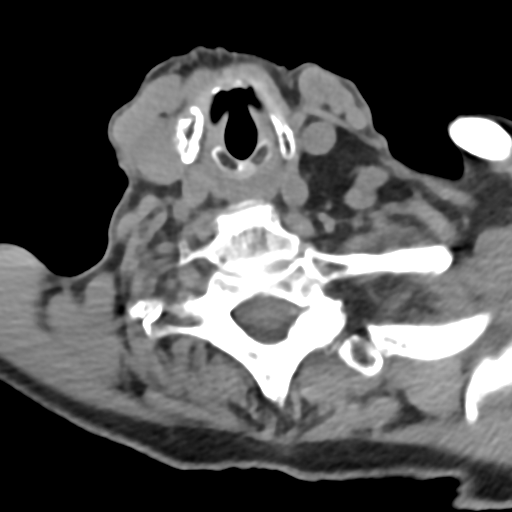
[im 60/90  soft-tissue]
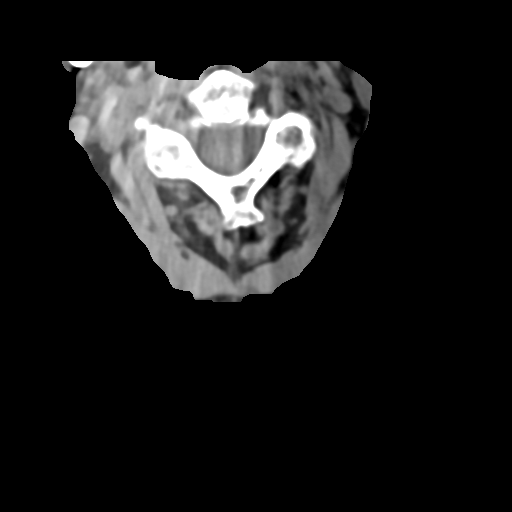

[Series 10: sag bone · sagittal · 0.24mm/px · 5 of 61 slices shown]
[im 11/61  bone]
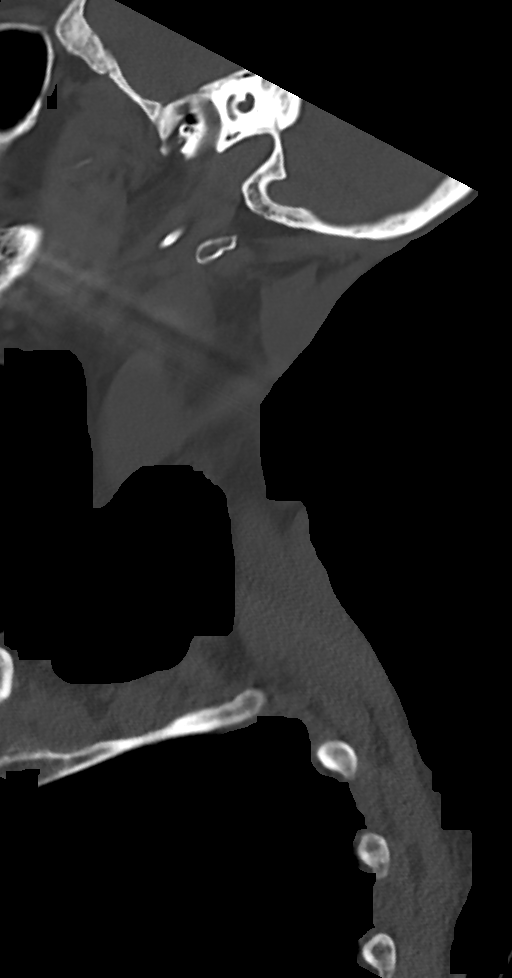
[im 21/61  bone]
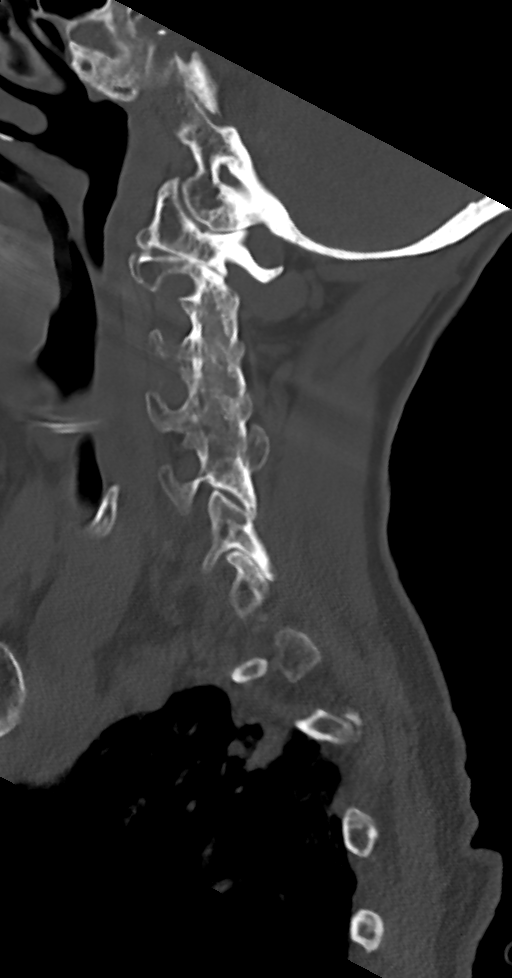
[im 31/61  bone]
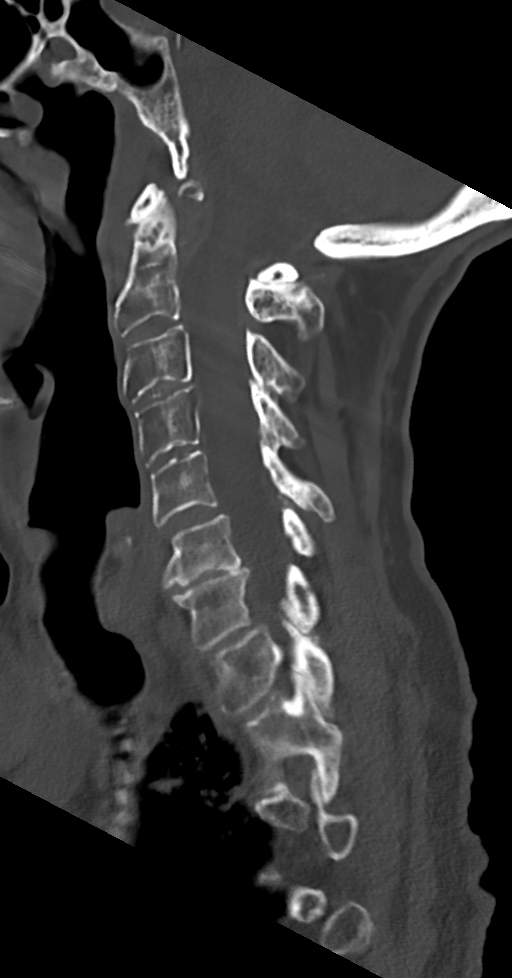
[im 41/61  bone]
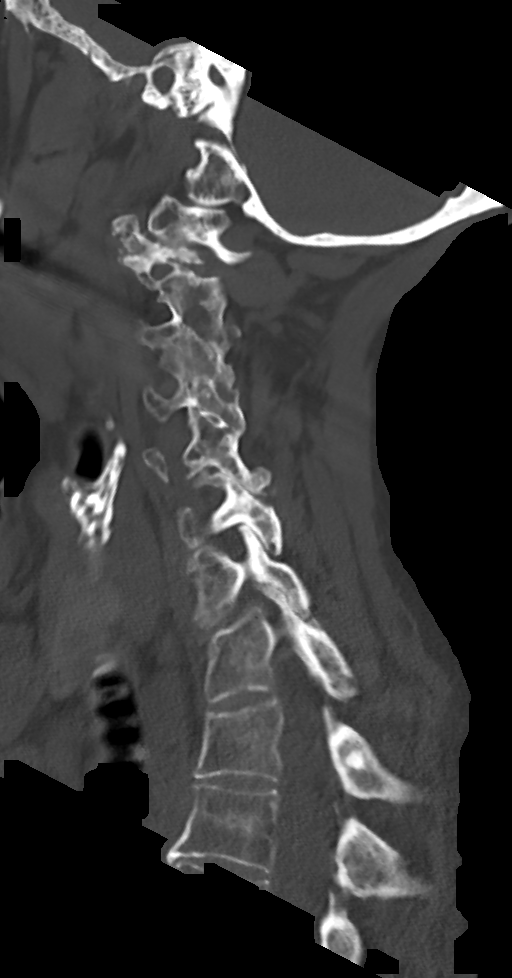
[im 51/61  bone]
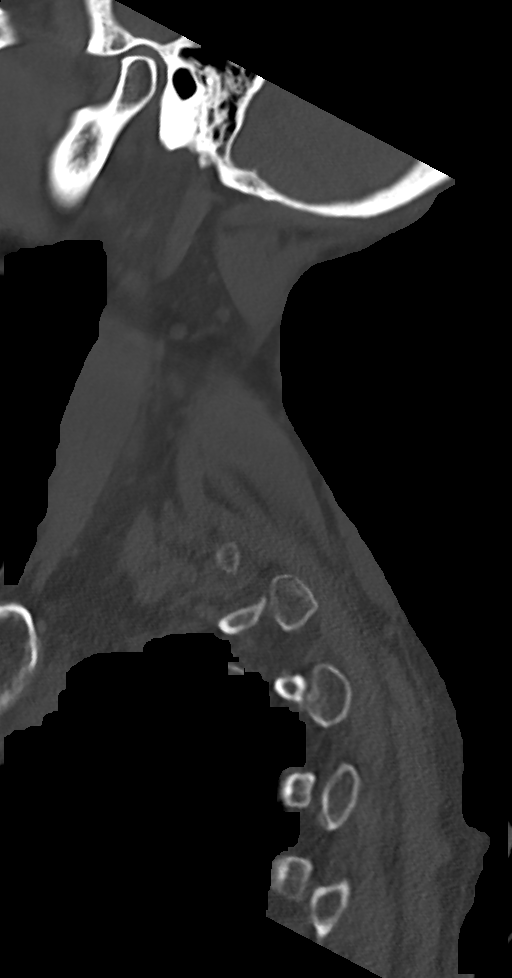

[Series 12: orthogonal axials · axial · 0.21mm/px · z∈[-283,-207]mm · 3 of 96 slices shown, 4 images]
[im 24/96  soft-tissue]
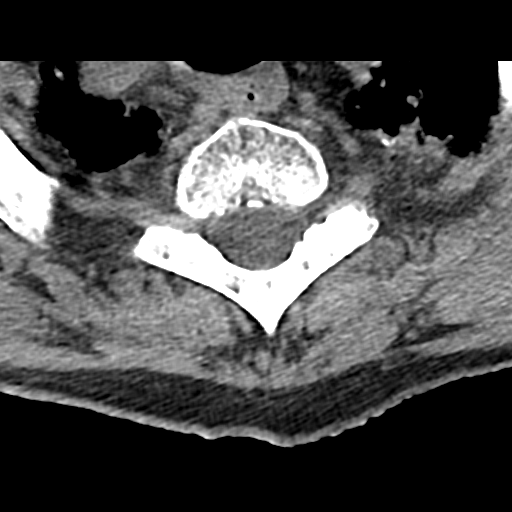
[im 24/96  bone]
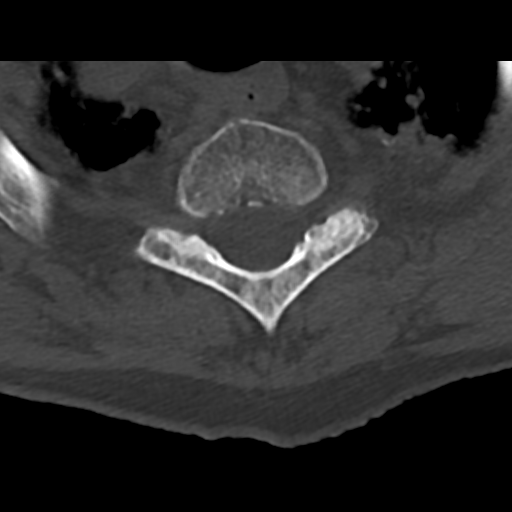
[im 48/96  bone]
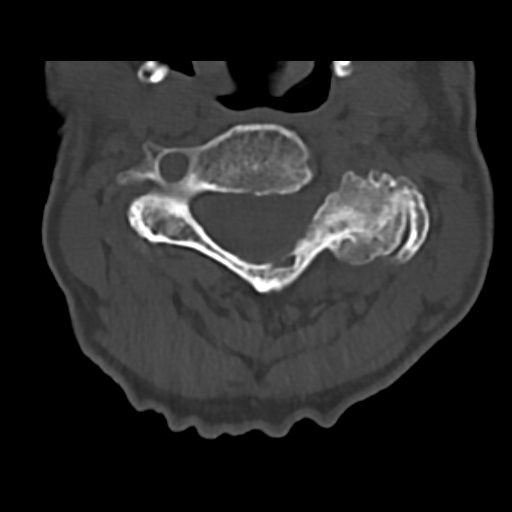
[im 72/96  bone]
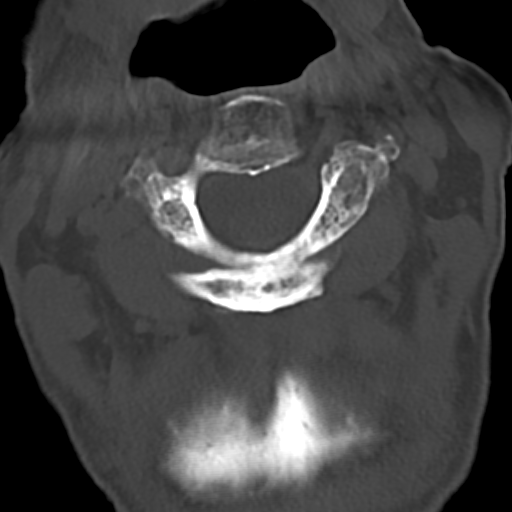

[13 of 33 positions shown; findings below may reference images not displayed]

FINDINGS: CT HEAD FINDINGS

Brain: Mild cerebral atrophy. Old lacunar infarct in the left basal
ganglia. No ventricular dilatation. No mass effect or midline shift.
No abnormal extra-axial fluid collections. Gray-white matter
junctions are distinct. Basal cisterns are not effaced. No acute
intracranial hemorrhage.

Vascular: Intracranial arterial vascular calcifications are present.

Skull: Calvarium appears intact.

Sinuses/Orbits: Paranasal sinuses and mastoid air cells are clear.

Other: None.

CT CERVICAL SPINE FINDINGS

Alignment: Straightening of usual cervical lordosis with slight
anterior subluxation at C5-6. This is unchanged since previous study
and is likely degenerative. Normal alignment of the facet joints.
C1-2 articulation appears intact.

Skull base and vertebrae: Skull base appears intact. No vertebral
compression deformities. No focal bone lesion or bone destruction.

Soft tissues and spinal canal: No prevertebral fluid or swelling. No
visible canal hematoma.

Disc levels: Degenerative changes throughout the cervical spine with
narrowed interspaces and endplate hypertrophic changes. Vertebral
degenerative changes are most prominent at C1-2, C3-4, and C6-7
levels. Diffuse degenerative changes throughout the cervical facet
joints with congenital or degenerative coalition of C2 through C5
posteriorly.

Upper chest: Emphysematous changes and fibrosis in the lung apices.
Nodular scarring.

Other: None.
IMPRESSION: 1. No acute intracranial abnormalities. Mild cerebral atrophy and
old lacunar infarcts in the deep white matter.
2. Unchanged alignment of the cervical spine since previous study.
Prominent diffuse degenerative changes. No acute displaced fractures
identified.
3. Emphysematous changes, fibrosis, and nodular scarring in the lung
apices.
